# Patient Record
Sex: Female | Born: 1940 | Race: Black or African American | Hispanic: No | Smoking: Former smoker
Health system: Southern US, Community
[De-identification: ages and names within clinical notes are randomized; demographics above are authoritative.]

## PROBLEM LIST (undated history)

## (undated) DIAGNOSIS — I10 Essential (primary) hypertension: Secondary | ICD-10-CM

## (undated) DIAGNOSIS — D649 Anemia, unspecified: Secondary | ICD-10-CM

## (undated) DIAGNOSIS — J449 Chronic obstructive pulmonary disease, unspecified: Secondary | ICD-10-CM

## (undated) DIAGNOSIS — E785 Hyperlipidemia, unspecified: Secondary | ICD-10-CM

## (undated) DIAGNOSIS — H547 Unspecified visual loss: Secondary | ICD-10-CM

## (undated) DIAGNOSIS — R109 Unspecified abdominal pain: Secondary | ICD-10-CM

## (undated) DIAGNOSIS — N183 Chronic kidney disease, stage 3 unspecified: Secondary | ICD-10-CM

## (undated) DIAGNOSIS — M1990 Unspecified osteoarthritis, unspecified site: Secondary | ICD-10-CM

## (undated) DIAGNOSIS — Z720 Tobacco use: Secondary | ICD-10-CM

## (undated) DIAGNOSIS — C3490 Malignant neoplasm of unspecified part of unspecified bronchus or lung: Secondary | ICD-10-CM

## (undated) DIAGNOSIS — I739 Peripheral vascular disease, unspecified: Secondary | ICD-10-CM

## (undated) DIAGNOSIS — I82409 Acute embolism and thrombosis of unspecified deep veins of unspecified lower extremity: Secondary | ICD-10-CM

## (undated) DIAGNOSIS — I809 Phlebitis and thrombophlebitis of unspecified site: Secondary | ICD-10-CM

## (undated) DIAGNOSIS — C801 Malignant (primary) neoplasm, unspecified: Secondary | ICD-10-CM

## (undated) HISTORY — PX: WRIST FRACTURE SURGERY: SHX121

## (undated) HISTORY — PX: FRACTURE SURGERY: SHX138

## (undated) HISTORY — PX: EYE SURGERY: SHX253

## (undated) HISTORY — DX: Essential (primary) hypertension: I10

## (undated) HISTORY — DX: Unspecified abdominal pain: R10.9

## (undated) HISTORY — DX: Unspecified visual loss: H54.7

---

## 1971-05-13 HISTORY — PX: SHOULDER OPEN ROTATOR CUFF REPAIR: SHX2407

## 1981-05-12 HISTORY — PX: PATELLA FRACTURE SURGERY: SHX735

## 1997-09-16 ENCOUNTER — Emergency Department (HOSPITAL_COMMUNITY): Admission: EM | Admit: 1997-09-16 | Discharge: 1997-09-16 | Payer: Self-pay | Admitting: Emergency Medicine

## 1997-09-17 ENCOUNTER — Emergency Department (HOSPITAL_COMMUNITY): Admission: EM | Admit: 1997-09-17 | Discharge: 1997-09-17 | Payer: Self-pay | Admitting: Emergency Medicine

## 1997-09-19 ENCOUNTER — Emergency Department (HOSPITAL_COMMUNITY): Admission: EM | Admit: 1997-09-19 | Discharge: 1997-09-19 | Payer: Self-pay | Admitting: Emergency Medicine

## 1998-08-03 ENCOUNTER — Emergency Department (HOSPITAL_COMMUNITY): Admission: EM | Admit: 1998-08-03 | Discharge: 1998-08-03 | Payer: Self-pay | Admitting: Emergency Medicine

## 1998-10-14 ENCOUNTER — Encounter: Payer: Self-pay | Admitting: Emergency Medicine

## 1998-10-14 ENCOUNTER — Emergency Department (HOSPITAL_COMMUNITY): Admission: EM | Admit: 1998-10-14 | Discharge: 1998-10-14 | Payer: Self-pay | Admitting: Emergency Medicine

## 1998-10-18 ENCOUNTER — Encounter: Admission: RE | Admit: 1998-10-18 | Discharge: 1998-10-18 | Payer: Self-pay | Admitting: Internal Medicine

## 1998-11-15 ENCOUNTER — Encounter: Admission: RE | Admit: 1998-11-15 | Discharge: 1998-11-15 | Payer: Self-pay | Admitting: Hematology and Oncology

## 1998-11-29 ENCOUNTER — Encounter: Admission: RE | Admit: 1998-11-29 | Discharge: 1998-11-29 | Payer: Self-pay | Admitting: Hematology and Oncology

## 1999-09-17 ENCOUNTER — Emergency Department (HOSPITAL_COMMUNITY): Admission: EM | Admit: 1999-09-17 | Discharge: 1999-09-17 | Payer: Self-pay | Admitting: Emergency Medicine

## 1999-11-27 ENCOUNTER — Emergency Department (HOSPITAL_COMMUNITY): Admission: EM | Admit: 1999-11-27 | Discharge: 1999-11-27 | Payer: Self-pay | Admitting: Emergency Medicine

## 2000-04-07 ENCOUNTER — Emergency Department (HOSPITAL_COMMUNITY): Admission: EM | Admit: 2000-04-07 | Discharge: 2000-04-07 | Payer: Self-pay | Admitting: Emergency Medicine

## 2000-04-22 ENCOUNTER — Emergency Department (HOSPITAL_COMMUNITY): Admission: EM | Admit: 2000-04-22 | Discharge: 2000-04-22 | Payer: Self-pay | Admitting: Emergency Medicine

## 2000-04-27 ENCOUNTER — Encounter: Admission: RE | Admit: 2000-04-27 | Discharge: 2000-04-27 | Payer: Self-pay | Admitting: Internal Medicine

## 2000-08-17 ENCOUNTER — Encounter: Admission: RE | Admit: 2000-08-17 | Discharge: 2000-08-17 | Payer: Self-pay | Admitting: Internal Medicine

## 2001-01-03 ENCOUNTER — Emergency Department (HOSPITAL_COMMUNITY): Admission: EM | Admit: 2001-01-03 | Discharge: 2001-01-03 | Payer: Self-pay | Admitting: Emergency Medicine

## 2001-01-03 ENCOUNTER — Encounter: Payer: Self-pay | Admitting: Emergency Medicine

## 2001-01-19 ENCOUNTER — Encounter: Payer: Self-pay | Admitting: Emergency Medicine

## 2001-01-19 ENCOUNTER — Emergency Department (HOSPITAL_COMMUNITY): Admission: EM | Admit: 2001-01-19 | Discharge: 2001-01-19 | Payer: Self-pay | Admitting: Emergency Medicine

## 2001-01-22 ENCOUNTER — Encounter: Admission: RE | Admit: 2001-01-22 | Discharge: 2001-01-22 | Payer: Self-pay | Admitting: Internal Medicine

## 2001-02-16 ENCOUNTER — Encounter: Admission: RE | Admit: 2001-02-16 | Discharge: 2001-02-16 | Payer: Self-pay | Admitting: Internal Medicine

## 2001-02-22 ENCOUNTER — Encounter: Admission: RE | Admit: 2001-02-22 | Discharge: 2001-02-22 | Payer: Self-pay

## 2001-07-13 ENCOUNTER — Encounter: Admission: RE | Admit: 2001-07-13 | Discharge: 2001-07-13 | Payer: Self-pay | Admitting: Internal Medicine

## 2002-01-22 ENCOUNTER — Emergency Department (HOSPITAL_COMMUNITY): Admission: EM | Admit: 2002-01-22 | Discharge: 2002-01-22 | Payer: Self-pay | Admitting: Emergency Medicine

## 2002-02-16 ENCOUNTER — Emergency Department (HOSPITAL_COMMUNITY): Admission: EM | Admit: 2002-02-16 | Discharge: 2002-02-16 | Payer: Self-pay | Admitting: Emergency Medicine

## 2002-02-24 ENCOUNTER — Encounter: Admission: RE | Admit: 2002-02-24 | Discharge: 2002-02-24 | Payer: Self-pay | Admitting: Internal Medicine

## 2002-04-09 ENCOUNTER — Encounter: Payer: Self-pay | Admitting: Emergency Medicine

## 2002-04-09 ENCOUNTER — Emergency Department (HOSPITAL_COMMUNITY): Admission: EM | Admit: 2002-04-09 | Discharge: 2002-04-09 | Payer: Self-pay | Admitting: Emergency Medicine

## 2002-05-30 ENCOUNTER — Emergency Department (HOSPITAL_COMMUNITY): Admission: EM | Admit: 2002-05-30 | Discharge: 2002-05-30 | Payer: Self-pay | Admitting: Emergency Medicine

## 2002-09-10 HISTORY — PX: SHOULDER ARTHROSCOPY W/ ROTATOR CUFF REPAIR: SHX2400

## 2002-10-04 ENCOUNTER — Ambulatory Visit (HOSPITAL_BASED_OUTPATIENT_CLINIC_OR_DEPARTMENT_OTHER): Admission: RE | Admit: 2002-10-04 | Discharge: 2002-10-05 | Payer: Self-pay | Admitting: Orthopaedic Surgery

## 2002-10-04 ENCOUNTER — Encounter: Payer: Self-pay | Admitting: Orthopaedic Surgery

## 2002-10-05 ENCOUNTER — Encounter: Payer: Self-pay | Admitting: Orthopaedic Surgery

## 2002-10-20 ENCOUNTER — Emergency Department (HOSPITAL_COMMUNITY): Admission: EM | Admit: 2002-10-20 | Discharge: 2002-10-21 | Payer: Self-pay | Admitting: Emergency Medicine

## 2002-10-21 ENCOUNTER — Encounter: Payer: Self-pay | Admitting: Emergency Medicine

## 2002-12-08 ENCOUNTER — Emergency Department (HOSPITAL_COMMUNITY): Admission: EM | Admit: 2002-12-08 | Discharge: 2002-12-09 | Payer: Self-pay

## 2002-12-11 ENCOUNTER — Emergency Department (HOSPITAL_COMMUNITY): Admission: EM | Admit: 2002-12-11 | Discharge: 2002-12-11 | Payer: Self-pay | Admitting: Emergency Medicine

## 2002-12-13 ENCOUNTER — Encounter: Admission: RE | Admit: 2002-12-13 | Discharge: 2002-12-13 | Payer: Self-pay | Admitting: Internal Medicine

## 2002-12-18 ENCOUNTER — Emergency Department (HOSPITAL_COMMUNITY): Admission: EM | Admit: 2002-12-18 | Discharge: 2002-12-18 | Payer: Self-pay | Admitting: Emergency Medicine

## 2003-01-13 ENCOUNTER — Encounter: Admission: RE | Admit: 2003-01-13 | Discharge: 2003-01-13 | Payer: Self-pay | Admitting: Internal Medicine

## 2003-01-30 ENCOUNTER — Encounter: Admission: RE | Admit: 2003-01-30 | Discharge: 2003-01-30 | Payer: Self-pay | Admitting: Internal Medicine

## 2003-04-12 HISTORY — PX: CARPAL TUNNEL RELEASE: SHX101

## 2003-04-18 ENCOUNTER — Ambulatory Visit (HOSPITAL_BASED_OUTPATIENT_CLINIC_OR_DEPARTMENT_OTHER): Admission: RE | Admit: 2003-04-18 | Discharge: 2003-04-18 | Payer: Self-pay | Admitting: Orthopaedic Surgery

## 2003-04-18 ENCOUNTER — Ambulatory Visit (HOSPITAL_COMMUNITY): Admission: RE | Admit: 2003-04-18 | Discharge: 2003-04-18 | Payer: Self-pay | Admitting: Orthopaedic Surgery

## 2003-06-13 HISTORY — PX: SHOULDER OPEN ROTATOR CUFF REPAIR: SHX2407

## 2003-06-27 ENCOUNTER — Observation Stay (HOSPITAL_COMMUNITY): Admission: AD | Admit: 2003-06-27 | Discharge: 2003-06-28 | Payer: Self-pay | Admitting: Orthopaedic Surgery

## 2003-06-27 ENCOUNTER — Ambulatory Visit (HOSPITAL_BASED_OUTPATIENT_CLINIC_OR_DEPARTMENT_OTHER): Admission: RE | Admit: 2003-06-27 | Discharge: 2003-06-27 | Payer: Self-pay | Admitting: Orthopaedic Surgery

## 2003-06-27 ENCOUNTER — Ambulatory Visit (HOSPITAL_COMMUNITY): Admission: RE | Admit: 2003-06-27 | Discharge: 2003-06-27 | Payer: Self-pay | Admitting: Orthopaedic Surgery

## 2004-02-13 ENCOUNTER — Ambulatory Visit: Payer: Self-pay | Admitting: Internal Medicine

## 2004-03-05 ENCOUNTER — Ambulatory Visit: Payer: Self-pay | Admitting: Internal Medicine

## 2005-01-08 ENCOUNTER — Ambulatory Visit: Payer: Self-pay | Admitting: Internal Medicine

## 2005-01-21 ENCOUNTER — Ambulatory Visit (HOSPITAL_COMMUNITY): Admission: RE | Admit: 2005-01-21 | Discharge: 2005-01-21 | Payer: Self-pay | Admitting: Radiology

## 2005-01-21 ENCOUNTER — Encounter (INDEPENDENT_AMBULATORY_CARE_PROVIDER_SITE_OTHER): Payer: Self-pay | Admitting: Pulmonary Disease

## 2005-01-26 LAB — HM MAMMOGRAPHY: HM Mammogram: NORMAL

## 2005-02-24 ENCOUNTER — Ambulatory Visit (HOSPITAL_COMMUNITY): Admission: RE | Admit: 2005-02-24 | Discharge: 2005-02-24 | Payer: Self-pay | Admitting: Pulmonary Disease

## 2005-06-27 ENCOUNTER — Ambulatory Visit: Payer: Self-pay | Admitting: Internal Medicine

## 2005-07-23 ENCOUNTER — Ambulatory Visit: Payer: Self-pay | Admitting: Internal Medicine

## 2005-12-15 ENCOUNTER — Ambulatory Visit: Payer: Self-pay | Admitting: Internal Medicine

## 2006-02-23 ENCOUNTER — Emergency Department (HOSPITAL_COMMUNITY): Admission: EM | Admit: 2006-02-23 | Discharge: 2006-02-23 | Payer: Self-pay | Admitting: Emergency Medicine

## 2006-03-26 DIAGNOSIS — H548 Legal blindness, as defined in USA: Secondary | ICD-10-CM | POA: Insufficient documentation

## 2006-03-26 DIAGNOSIS — F17200 Nicotine dependence, unspecified, uncomplicated: Secondary | ICD-10-CM

## 2006-03-26 DIAGNOSIS — I10 Essential (primary) hypertension: Secondary | ICD-10-CM | POA: Insufficient documentation

## 2006-04-22 ENCOUNTER — Encounter: Admission: RE | Admit: 2006-04-22 | Discharge: 2006-07-21 | Payer: Self-pay | Admitting: Ophthalmology

## 2006-05-13 DIAGNOSIS — H9190 Unspecified hearing loss, unspecified ear: Secondary | ICD-10-CM

## 2006-06-25 ENCOUNTER — Encounter (INDEPENDENT_AMBULATORY_CARE_PROVIDER_SITE_OTHER): Payer: Self-pay | Admitting: Pulmonary Disease

## 2006-06-26 ENCOUNTER — Telehealth: Payer: Self-pay | Admitting: *Deleted

## 2006-07-02 ENCOUNTER — Ambulatory Visit: Payer: Self-pay | Admitting: Hospitalist

## 2006-07-02 ENCOUNTER — Encounter (INDEPENDENT_AMBULATORY_CARE_PROVIDER_SITE_OTHER): Payer: Self-pay | Admitting: Pulmonary Disease

## 2006-07-02 DIAGNOSIS — E785 Hyperlipidemia, unspecified: Secondary | ICD-10-CM | POA: Insufficient documentation

## 2006-07-02 DIAGNOSIS — J4489 Other specified chronic obstructive pulmonary disease: Secondary | ICD-10-CM | POA: Insufficient documentation

## 2006-07-02 DIAGNOSIS — J449 Chronic obstructive pulmonary disease, unspecified: Secondary | ICD-10-CM

## 2006-07-02 LAB — CONVERTED CEMR LAB
BUN: 9 mg/dL (ref 6–23)
CO2: 31 meq/L (ref 19–32)
Calcium: 9.3 mg/dL (ref 8.4–10.5)
Chloride: 102 meq/L (ref 96–112)
Creatinine, Ser: 0.96 mg/dL (ref 0.40–1.20)
Glucose, Bld: 87 mg/dL (ref 70–99)
Sodium: 136 meq/L (ref 135–145)

## 2006-07-07 ENCOUNTER — Ambulatory Visit: Payer: Self-pay | Admitting: Hospitalist

## 2006-07-10 ENCOUNTER — Telehealth (INDEPENDENT_AMBULATORY_CARE_PROVIDER_SITE_OTHER): Payer: Self-pay | Admitting: Pharmacy Technician

## 2006-07-16 ENCOUNTER — Ambulatory Visit: Payer: Self-pay | Admitting: Internal Medicine

## 2006-12-16 ENCOUNTER — Ambulatory Visit: Payer: Self-pay | Admitting: Internal Medicine

## 2006-12-16 ENCOUNTER — Ambulatory Visit (HOSPITAL_COMMUNITY): Admission: RE | Admit: 2006-12-16 | Discharge: 2006-12-16 | Payer: Self-pay | Admitting: *Deleted

## 2006-12-17 ENCOUNTER — Ambulatory Visit (HOSPITAL_COMMUNITY): Admission: RE | Admit: 2006-12-17 | Discharge: 2006-12-17 | Payer: Self-pay | Admitting: *Deleted

## 2006-12-17 ENCOUNTER — Ambulatory Visit: Payer: Self-pay | Admitting: Vascular Surgery

## 2006-12-17 ENCOUNTER — Encounter (INDEPENDENT_AMBULATORY_CARE_PROVIDER_SITE_OTHER): Payer: Self-pay | Admitting: *Deleted

## 2006-12-23 ENCOUNTER — Encounter: Payer: Self-pay | Admitting: Podiatry

## 2006-12-23 ENCOUNTER — Ambulatory Visit (HOSPITAL_COMMUNITY): Admission: RE | Admit: 2006-12-23 | Discharge: 2006-12-23 | Payer: Self-pay | Admitting: Podiatry

## 2007-03-19 ENCOUNTER — Encounter (INDEPENDENT_AMBULATORY_CARE_PROVIDER_SITE_OTHER): Payer: Self-pay | Admitting: Internal Medicine

## 2007-03-19 ENCOUNTER — Ambulatory Visit: Payer: Self-pay | Admitting: *Deleted

## 2007-03-22 LAB — CONVERTED CEMR LAB
ALT: 10 units/L (ref 0–35)
Alkaline Phosphatase: 81 units/L (ref 39–117)
BUN: 16 mg/dL (ref 6–23)
Chloride: 104 meq/L (ref 96–112)
Glucose, Bld: 105 mg/dL — ABNORMAL HIGH (ref 70–99)
HDL: 54 mg/dL (ref >39)
LDL Cholesterol: 125 mg/dL — ABNORMAL HIGH (ref 0–99)
Sodium: 140 meq/L (ref 135–145)
Total Bilirubin: 0.5 mg/dL (ref 0.3–1.2)
Total CHOL/HDL Ratio: 3.7
VLDL: 21 mg/dL (ref 0–40)

## 2007-04-19 ENCOUNTER — Emergency Department (HOSPITAL_COMMUNITY): Admission: EM | Admit: 2007-04-19 | Discharge: 2007-04-19 | Payer: Self-pay | Admitting: Emergency Medicine

## 2007-07-16 ENCOUNTER — Encounter (INDEPENDENT_AMBULATORY_CARE_PROVIDER_SITE_OTHER): Payer: Self-pay | Admitting: Internal Medicine

## 2007-07-16 ENCOUNTER — Ambulatory Visit: Payer: Self-pay | Admitting: Hospitalist

## 2007-07-16 DIAGNOSIS — H6120 Impacted cerumen, unspecified ear: Secondary | ICD-10-CM

## 2007-07-16 LAB — CONVERTED CEMR LAB
ALT: <8 units/L (ref 0–35)
Albumin: 4 g/dL (ref 3.5–5.2)
CO2: 24 meq/L (ref 19–32)
Calcium: 9 mg/dL (ref 8.4–10.5)
Chloride: 104 meq/L (ref 96–112)
Glucose, Bld: 97 mg/dL (ref 70–99)
Potassium: 4.5 meq/L (ref 3.5–5.3)

## 2007-07-30 ENCOUNTER — Ambulatory Visit: Payer: Self-pay | Admitting: Hospitalist

## 2007-07-30 ENCOUNTER — Encounter (INDEPENDENT_AMBULATORY_CARE_PROVIDER_SITE_OTHER): Payer: Self-pay | Admitting: Internal Medicine

## 2007-07-30 LAB — CONVERTED CEMR LAB
CO2: 26 meq/L (ref 19–32)
Potassium: 3.8 meq/L (ref 3.5–5.3)

## 2007-11-04 ENCOUNTER — Encounter (INDEPENDENT_AMBULATORY_CARE_PROVIDER_SITE_OTHER): Payer: Self-pay | Admitting: *Deleted

## 2007-12-02 ENCOUNTER — Encounter (INDEPENDENT_AMBULATORY_CARE_PROVIDER_SITE_OTHER): Payer: Self-pay | Admitting: *Deleted

## 2008-03-06 ENCOUNTER — Telehealth (INDEPENDENT_AMBULATORY_CARE_PROVIDER_SITE_OTHER): Payer: Self-pay | Admitting: *Deleted

## 2008-03-14 ENCOUNTER — Encounter (INDEPENDENT_AMBULATORY_CARE_PROVIDER_SITE_OTHER): Payer: Self-pay | Admitting: *Deleted

## 2008-03-21 ENCOUNTER — Encounter (INDEPENDENT_AMBULATORY_CARE_PROVIDER_SITE_OTHER): Payer: Self-pay | Admitting: *Deleted

## 2008-03-23 ENCOUNTER — Encounter (INDEPENDENT_AMBULATORY_CARE_PROVIDER_SITE_OTHER): Payer: Self-pay | Admitting: *Deleted

## 2008-04-04 ENCOUNTER — Telehealth (INDEPENDENT_AMBULATORY_CARE_PROVIDER_SITE_OTHER): Payer: Self-pay | Admitting: *Deleted

## 2008-04-05 ENCOUNTER — Encounter (INDEPENDENT_AMBULATORY_CARE_PROVIDER_SITE_OTHER): Payer: Self-pay | Admitting: *Deleted

## 2008-04-17 ENCOUNTER — Encounter (INDEPENDENT_AMBULATORY_CARE_PROVIDER_SITE_OTHER): Payer: Self-pay | Admitting: *Deleted

## 2008-04-17 ENCOUNTER — Ambulatory Visit: Payer: Self-pay | Admitting: Internal Medicine

## 2008-04-27 LAB — CONVERTED CEMR LAB
AST: 12 units/L (ref 0–37)
Cholesterol: 205 mg/dL — ABNORMAL HIGH (ref 0–200)
HDL: 55 mg/dL (ref >39)
Potassium: 4.8 meq/L (ref 3.5–5.3)
Sodium: 140 meq/L (ref 135–145)
Total Bilirubin: 0.5 mg/dL (ref 0.3–1.2)
Total CHOL/HDL Ratio: 3.7
Total Protein: 7.2 g/dL (ref 6.0–8.3)
Triglycerides: 110 mg/dL (ref <150)
VLDL: 22 mg/dL (ref 0–40)

## 2008-06-19 ENCOUNTER — Encounter (INDEPENDENT_AMBULATORY_CARE_PROVIDER_SITE_OTHER): Payer: Self-pay | Admitting: *Deleted

## 2008-08-04 ENCOUNTER — Encounter (INDEPENDENT_AMBULATORY_CARE_PROVIDER_SITE_OTHER): Payer: Self-pay | Admitting: *Deleted

## 2008-08-11 ENCOUNTER — Encounter (INDEPENDENT_AMBULATORY_CARE_PROVIDER_SITE_OTHER): Payer: Self-pay | Admitting: *Deleted

## 2008-08-17 ENCOUNTER — Encounter (INDEPENDENT_AMBULATORY_CARE_PROVIDER_SITE_OTHER): Payer: Self-pay | Admitting: *Deleted

## 2008-08-31 ENCOUNTER — Encounter (INDEPENDENT_AMBULATORY_CARE_PROVIDER_SITE_OTHER): Payer: Self-pay | Admitting: *Deleted

## 2008-12-29 ENCOUNTER — Emergency Department (HOSPITAL_COMMUNITY): Admission: EM | Admit: 2008-12-29 | Discharge: 2008-12-30 | Payer: Self-pay | Admitting: Emergency Medicine

## 2009-02-02 ENCOUNTER — Encounter: Payer: Self-pay | Admitting: Internal Medicine

## 2009-02-02 ENCOUNTER — Ambulatory Visit: Payer: Self-pay | Admitting: Infectious Diseases

## 2009-02-02 DIAGNOSIS — L299 Pruritus, unspecified: Secondary | ICD-10-CM | POA: Insufficient documentation

## 2009-02-02 LAB — CONVERTED CEMR LAB
ALT: <8 units/L (ref 0–35)
BUN: 15 mg/dL (ref 6–23)
CO2: 26 meq/L (ref 19–32)
Cholesterol: 182 mg/dL (ref 0–200)
Glucose, Bld: 98 mg/dL (ref 70–99)
LDL Cholesterol: 101 mg/dL — ABNORMAL HIGH (ref 0–99)
Potassium: 4.2 meq/L (ref 3.5–5.3)
Total Bilirubin: 0.5 mg/dL (ref 0.3–1.2)
Triglycerides: 105 mg/dL (ref <150)

## 2009-08-05 ENCOUNTER — Encounter: Payer: Self-pay | Admitting: Internal Medicine

## 2009-08-05 ENCOUNTER — Observation Stay (HOSPITAL_COMMUNITY): Admission: EM | Admit: 2009-08-05 | Discharge: 2009-08-06 | Payer: Self-pay | Admitting: Emergency Medicine

## 2009-08-05 ENCOUNTER — Ambulatory Visit: Payer: Self-pay | Admitting: Internal Medicine

## 2009-08-06 ENCOUNTER — Encounter: Payer: Self-pay | Admitting: Internal Medicine

## 2009-08-08 ENCOUNTER — Encounter: Payer: Self-pay | Admitting: Internal Medicine

## 2009-08-08 ENCOUNTER — Inpatient Hospital Stay (HOSPITAL_COMMUNITY): Admission: EM | Admit: 2009-08-08 | Discharge: 2009-08-12 | Payer: Self-pay | Admitting: Internal Medicine

## 2009-08-08 ENCOUNTER — Encounter: Payer: Self-pay | Admitting: Emergency Medicine

## 2009-08-11 ENCOUNTER — Encounter: Payer: Self-pay | Admitting: Internal Medicine

## 2009-08-16 ENCOUNTER — Ambulatory Visit: Payer: Self-pay | Admitting: Internal Medicine

## 2009-08-16 LAB — CONVERTED CEMR LAB
ALT: 17 units/L (ref 0–35)
Albumin: 4.2 g/dL (ref 3.5–5.2)
Alkaline Phosphatase: 72 units/L (ref 39–117)
BUN: 15 mg/dL (ref 6–23)
Chloride: 99 meq/L (ref 96–112)
Glucose, Bld: 98 mg/dL (ref 70–99)
HDL: 47 mg/dL (ref >39)
LDL Cholesterol: 96 mg/dL (ref 0–99)
Potassium: 3.9 meq/L (ref 3.5–5.3)
Total Bilirubin: 0.6 mg/dL (ref 0.3–1.2)
Total CHOL/HDL Ratio: 3.5
VLDL: 22 mg/dL (ref 0–40)

## 2009-09-13 ENCOUNTER — Inpatient Hospital Stay (HOSPITAL_COMMUNITY): Admission: EM | Admit: 2009-09-13 | Discharge: 2009-09-15 | Payer: Self-pay | Admitting: Emergency Medicine

## 2009-09-13 ENCOUNTER — Ambulatory Visit: Payer: Self-pay | Admitting: Internal Medicine

## 2009-09-13 ENCOUNTER — Encounter (INDEPENDENT_AMBULATORY_CARE_PROVIDER_SITE_OTHER): Payer: Self-pay | Admitting: Internal Medicine

## 2009-11-28 ENCOUNTER — Ambulatory Visit: Payer: Self-pay | Admitting: Vascular Surgery

## 2009-11-28 ENCOUNTER — Ambulatory Visit: Admission: RE | Admit: 2009-11-28 | Discharge: 2009-11-28 | Payer: Self-pay | Admitting: Internal Medicine

## 2009-11-28 ENCOUNTER — Ambulatory Visit: Payer: Self-pay | Admitting: Internal Medicine

## 2009-11-28 ENCOUNTER — Encounter: Payer: Self-pay | Admitting: Internal Medicine

## 2009-11-28 DIAGNOSIS — M79609 Pain in unspecified limb: Secondary | ICD-10-CM | POA: Insufficient documentation

## 2009-11-28 DIAGNOSIS — N183 Chronic kidney disease, stage 3 (moderate): Secondary | ICD-10-CM

## 2009-11-28 LAB — CONVERTED CEMR LAB
BUN: 10 mg/dL
CO2: 30 meq/L
Calcium: 9.2 mg/dL
Chloride: 103 meq/L
Creatinine, Ser: 0.95 mg/dL
Glucose, Bld: 97 mg/dL
Potassium: 4.3 meq/L
Sodium: 137 meq/L

## 2009-12-12 ENCOUNTER — Ambulatory Visit: Payer: Self-pay | Admitting: Internal Medicine

## 2010-01-10 HISTORY — PX: HAMMER TOE SURGERY: SHX385

## 2010-01-22 ENCOUNTER — Ambulatory Visit: Payer: Self-pay | Admitting: Internal Medicine

## 2010-01-22 LAB — CONVERTED CEMR LAB: Creatinine, Ser: 1.11 mg/dL (ref 0.40–1.20)

## 2010-01-24 ENCOUNTER — Ambulatory Visit (HOSPITAL_BASED_OUTPATIENT_CLINIC_OR_DEPARTMENT_OTHER): Admission: RE | Admit: 2010-01-24 | Discharge: 2010-01-24 | Payer: Self-pay | Admitting: Podiatry

## 2010-06-09 ENCOUNTER — Emergency Department (HOSPITAL_COMMUNITY)
Admission: EM | Admit: 2010-06-09 | Discharge: 2010-06-09 | Payer: Self-pay | Source: Home / Self Care | Admitting: Emergency Medicine

## 2010-06-11 NOTE — Assessment & Plan Note (Signed)
Summary: ACUTE-SCH SURGERY ON 12/04/2009 KNOT ON BACK OF LEG.(BOWERS)/CFB   Vital Signs:  Patient profile:   70 year old female Height:      61.1 inches (155.19 cm) Weight:      162.1 pounds (73.68 kg) BMI:     30.64 Temp:     97.6 degrees F (36.44 degrees C) oral Pulse rate:   74 / minute BP sitting:   185 / 91  (right arm)  Vitals Entered By: [name] Ditzler RN (November 28, 2009 9:47 AM) Is Patient Diabetic? No Pain Assessment Patient in pain? yes     Location: calf left leg Intensity: 8 Type: hurts Onset of pain  past 3 days Nutritional Status BMI of > 30 = obese Nutritional Status Detail appetite good  Have you ever been in a relationship where you felt threatened, hurt or afraid?denies   Does patient need assistance? Functional Status Self care Ambulation Normal Comments Ck calf left leg - hurts with walking past 3 days. Sch for left foot surgery 12/04/09.   Primary Care Provider:  Joaquin Courts  MD   History of Present Illness: Patient is a 70 year old female who presents today with concerns about left calf pain.  She noticed the pain a few weeks back and also the presence of "a kernel behind my leg."  She states that the pain hurts when she is walking for a while and gets better with rest and that her leg had also swollen up at that time.  She denies fever, chills, redness in the area, or warmth over the mass.  She tried ibuprofen and Vicodin which did not help the pain. She states that she has a history of cellulitis and clots in both legs but is unable to provide any further details.  She is concerned because she is scheduled to have surgery to fix hammer toes on her left foot on 12/04/09.    Of note her blood pressure is elevated today.  She reports that she has been taking her medicines for the last month.  Denies headaches, nausea, vomiting, spots in her vision, chest pain, or shortness of breath.   Depression History:      The patient denies a depressed mood most of  the day and a diminished interest in her usual daily activities.        The patient denies that she feels like life is not worth living, denies that she wishes that she were dead, and denies that she has thought about ending her life.         Preventive Screening-Counseling & Management  Alcohol-Tobacco     Smoking Status: current     Smoking Cessation Counseling: yes     Packs/Day: 1/2 pp week     Year Started: over 20 years ago  Caffeine-Diet-Exercise     Does Patient Exercise: yes     Type of exercise: walking/exercise room     Times/week: 2  Current Medications (verified): 1)  Prinivil 10 Mg  Tabs (Lisinopril) .... Take 1 Pill By Mouth Daily. 2)  Simvastatin 40 Mg  Tabs (Simvastatin) .... Take 1 Pill By Mouth Daily. 3)  Advair Diskus 100-50 Mcg/dose Misc (Fluticasone-Salmeterol) .... Inhale Twice A Day. 4)  Spiriva Handihaler 18 Mcg  Caps (Tiotropium Bromide Monohydrate) .... Take 1 Pill Inhaler Every Day. 5)  Vicodin 5-500 Mg Tabs (Hydrocodone-Acetaminophen) .... Take 1 Tablet 2 Times Daily As Needed For Pain 6)  Metoprolol Tartrate 50 Mg Tabs (Metoprolol Tartrate) .... Take 1  Tablet By Mouth Two Times A Day 7)  Aspir-Low 81 Mg Tbec (Aspirin) .... Take 1 Tablet By Mouth Once A Day  Allergies: 1)  Codeine 2)  Morphine  Past History:  Past medical, surgical, family and social histories (including risk factors) reviewed for relevance to current acute and chronic problems.  Past Medical History: Reviewed history from 04/17/2008 and no changes required. HYPERTENSION TOBACCO ABUSE BLINDNESS    - s/p bilateral corneal transplant - she can now see well HEARING LOSS  Past Surgical History: Reviewed history from 12/16/2006 and no changes required. Right shoulder surgery (rotator cuff) in 1973 - secondary numbness in R hand  Family History: Reviewed history from 12/16/2006 and no changes required. Mother and MGM died of liver CA Maternal uncle had colon CA   Social  History: Reviewed history and no changes required.  Review of Systems      See HPI  Physical Exam  Additional Exam:  General: Well developed, female in no acute distress  Head: Atraumatic, normocephalic with no signs of trauma  Eyes: PERRLA, EOM intact  Ears: TM intact  Nose: Nares patent, mucosa is pink and moist, no polyps noted  Mouth: Mucosa is pink and moist, dention,   Neck: Supple, full ROM, no thyromegaly or masses noted  Resp: Scattered expiratory wheezes bilaterally with mild rhonchi. CV: Regular rate and rhythm with no murmurs, rubs, or gallops noted  Abdomen: Soft, non-tender, non-distended with normal bowel sounds  Calf:  Bilateral +1 edema, there is a 1.5 cm diameter mass at the midpoint of the posterior portion of the left calf that is tender to palpation without erythema or warmth. It is fixed. Neurologic: Alert and oriented x3, Cranial nerves II-XII grossly intact, DTR normal and symmetric  Pulses: Radial, brachial, carotid, femoral, dorsal pedis, and posterior tibial pulses equal and symmetric     Impression & Recommendations:  Problem # 1:  CALF PAIN, LEFT (ICD-729.5) With her history of clots and the pain with walking and unilateral swelling we will do a venous doppler of the mass and left calf to check for DVT.   Results of the Venous duplex were telephoned to myself by [place] in the [facility].  She states that there is no thrombus noted and that the mass in the left calf is not vascular.  I think the next step is an MRI to better characterize the mass.  We will try to get that scheduled for today if possible because she has a hard time getting transportation here.  I will call the foot doctor Dr. Elvin So and coordinate with him to see if he is comfortable doing the surgery before we know exactly what this mass.  If a biopsy is needed we could conceivably get a biopsy done at the same time.  Orders: LE Venous Duplex (DVT) (DVT) MRI with Contrast (MRI  w/Contrast)  Problem # 2:  HYPERTENSION (ICD-401.9) Her blood pressure is elevated today. She reports that she is taking her medicines as prescribed.  We will increase her lisinopril to 20 mg daily and see her back in 1 month to reassess her blood pressure.  Her updated medication list for this problem includes:    Lisinopril 20 Mg Tabs (Lisinopril) .Marland Kitchen... Take 1 tablet by mouth once a day for blood pressure    Metoprolol Tartrate 50 Mg Tabs (Metoprolol tartrate) .Marland Kitchen... Take 1 tablet by mouth two times a day  BP today: 185/91 Prior BP: 145/86 (08/16/2009)  Labs Reviewed: K+: 3.9 (08/16/2009) Creat: :  1.08 (08/16/2009)   Chol: 165 (08/16/2009)   HDL: 47 (08/16/2009)   LDL: 96 (08/16/2009)   TG: 108 (08/16/2009)  Problem # 3:  HYPERLIPIDEMIA NEC/NOS (ICD-272.4) She is not diabetic so her goal is for LDL is 100.  She is at goal right now and taking her medications as prescribed.  She will need a recheck in October of her FLP and liver enzymes.  Her updated medication list for this problem includes:    Simvastatin 40 Mg Tabs (Simvastatin) .Marland Kitchen... Take 1 pill by mouth daily.  Labs Reviewed: SGOT: 15 (08/16/2009)   SGPT: 17 (08/16/2009)   HDL:47 (08/16/2009), 60 (02/02/2009)  LDL:96 (08/16/2009), 101 (04/54/0981)  Chol:165 (08/16/2009), 182 (02/02/2009)  Trig:108 (08/16/2009), 105 (02/02/2009)  Complete Medication List: 1)  Lisinopril 20 Mg Tabs (Lisinopril) .... Take 1 tablet by mouth once a day for blood pressure 2)  Simvastatin 40 Mg Tabs (Simvastatin) .... Take 1 pill by mouth daily. 3)  Advair Diskus 100-50 Mcg/dose Misc (Fluticasone-salmeterol) .... Inhale twice a day. 4)  Spiriva Handihaler 18 Mcg Caps (Tiotropium bromide monohydrate) .... Take 1 pill inhaler every day. 5)  Vicodin 5-500 Mg Tabs (Hydrocodone-acetaminophen) .... Take 1 tablet 2 times daily as needed for pain 6)  Metoprolol Tartrate 50 Mg Tabs (Metoprolol tartrate) .... Take 1 tablet by mouth two times a day 7)  Aspir-low  81 Mg Tbec (Aspirin) .... Take 1 tablet by mouth once a day  Other Orders: T-Basic Metabolic Panel [phone])  Patient Instructions: 1)  Start Lisinopril 20 mg Daily for blood pressure 2)  Do your Ultrasound today and then come back down to clinic to wait for results.   3)  Please schedule a follow-up appointment in 1 month. Prescriptions: METOPROLOL TARTRATE 50 MG TABS (METOPROLOL TARTRATE) Take 1 tablet by mouth two times a day  #62 x 3   Entered and Authorized by:   Leodis Sias MD   Signed by:   Leodis Sias MD on 11/28/2009   Method used:   Electronically to        [facility] [address] (retail)       [address]       [place], [place]  [id number]       Ph: [phone]       Fax: [phone]   RxID:   [id number] LISINOPRIL 20 MG TABS (LISINOPRIL) Take 1 tablet by mouth once a day for blood pressure  #30 x 11   Entered and Authorized by:   Leodis Sias MD   Signed by:   Leodis Sias MD on 11/28/2009   Method used:   Electronically to        [facility] (retail)       [address]       [place], [place]  [id number]       Ph: [phone]       Fax: [phone]   RxID:   [id number]  Process Orders Check Orders Results:     [facility]: Check successful Tests Sent for requisitioning (December 07, 2009 12:35 AM):     11/28/2009: [facility] -- T-Basic Metabolic Panel [phone] (signed)    Prevention & Chronic Care Immunizations   Influenza vaccine: Fluvax 3+  (02/02/2009)   Influenza vaccine deferral: Not available  (11/28/2009)    Tetanus booster: Not documented   Td booster deferral: Deferred  (11/28/2009)    Pneumococcal vaccine: Not documented   Pneumococcal vaccine deferral: Deferred  (11/28/2009)  H. zoster vaccine: Not documented   H. zoster vaccine deferral: Not indicated  (08/16/2009)  Colorectal Screening   Hemoccult: Not documented    Hemoccult action/deferral: Not indicated  (08/16/2009)    Colonoscopy: Not documented   Colonoscopy action/deferral: Deferred  (11/28/2009)  Other Screening   Pap smear: Not documented   Pap smear action/deferral: Deferred-3 yr interval  (08/16/2009)    Mammogram: Normal  (Jun 24, 202006)   Mammogram action/deferral: Deferred-2 yr interval  (08/16/2009)    DXA bone density scan: Not documented   DXA bone density action/deferral: Refused  (08/16/2009)   Smoking status: current  (11/28/2009)   Smoking cessation counseling: yes  (11/28/2009)  Lipids   Total Cholesterol: 165  (08/16/2009)   Lipid panel action/deferral: Lipid Panel ordered   LDL: 96  (08/16/2009)   LDL Direct: Not documented   HDL: 47  (08/16/2009)   Triglycerides: 108  (08/16/2009)    SGOT (AST): 15  (08/16/2009)   SGPT (ALT): 17  (08/16/2009)   Alkaline phosphatase: 72  (08/16/2009)   Total bilirubin: 0.6  (08/16/2009)    Lipid flowsheet reviewed?: Yes   Progress toward LDL goal: At goal  Hypertension   Last Blood Pressure: 185 / 91  (11/28/2009)   Serum creatinine: 1.08  (08/16/2009)   Serum potassium 3.9  (08/16/2009)    Hypertension flowsheet reviewed?: Yes   Progress toward BP goal: Deteriorated  Self-Management Support :   Personal Goals (by the next clinic visit) :      Personal blood pressure goal: 140/90  (02/02/2009)     Personal LDL goal: 130  (02/02/2009)    Patient will work on the following items until the next clinic visit to reach self-care goals:     Medications and monitoring: take my medicines every day  (11/28/2009)     Eating: eat more vegetables, use fresh or frozen vegetables, eat foods that are low in salt, eat fruit for snacks and desserts  (11/28/2009)     Activity: take a 30 minute walk every day  (11/28/2009)     Other: EXERCISE  BIKE  (02/02/2009)    Hypertension self-management support: Written self-care plan, Education handout, Resources for patients handout   (11/28/2009)   Hypertension self-care plan printed.   Hypertension education handout printed    Lipid self-management support: Written self-care plan, Education handout, Resources for patients handout  (11/28/2009)   Lipid self-care plan printed.   Lipid education handout printed      Resource handout printed.  Appended Document: ACUTE-SCH SURGERY ON 12/04/2009 KNOT ON BACK OF LEG.(BOWERS)/CFB I have discussed the care of this patient in detail with the resident and agree fully with the documentation completed.

## 2010-06-11 NOTE — Miscellaneous (Signed)
Summary: Hospital Discharge Update  Hospital Discharge  Date of admission:08/05/2009  Date of discharge:08/06/2009  Brief reason for admission/active problems: 1) Epigastric pain likely secondary to ileus vs small bowel obstruction   Followup needed: Pt will be called by our clinic rep for the exact appointment date and time. Please follow up on her abdominal pain. No need to check bmet or cbc.  The medication and problem lists have been updated.  Please see the dictated discharge summary for details.     Prescriptions: VICODIN 5-500 MG TABS (HYDROCODONE-ACETAMINOPHEN) take 1 tablet 2 times daily as needed for pain  #30 x 0   Entered by:   Mliss Sax MD   Authorized by:   Melida Quitter MD   Signed by:   Mliss Sax MD on 08/06/2009   Method used:   Print then Give to Patient   RxID:   [id number]    Patient Instructions: 1)  You will be called from our clinic for the exact appointment time and date. 2)  Please check your blood pressure regularly, if it is >170 please call clinic at [phone]   Appended Document: Hospital Discharge Update Pt will be called by our clinic rep for the exact appointment date and time. Please follow up on her abdominal pain. Please stop taking HCTZ since you have low potassium. On follow up appointment check bmet to ensure that all labs are WNL.  Patient Instructions: 1)  Do not take HCTZ until you come to our clinic. 2)  You will be called with the exact appointment date and time.  3)  Check your Blood Pressure regularly. If it is above: you should make an appointment.

## 2010-06-11 NOTE — Initial Assessments (Signed)
INTERNAL MEDICINE ADMISSION HISTORY AND PHYSICAL  PCP: Dr. Joaquin Courts  Attending: Dr. Phillips Odor.  First Contact: Dr. Baltazar Apo [phone]) Second Contact: Dr. Aldine Contes [phone])  Weekdays, Holidays, or after 5pm weekdays:  First Contact: [phone] Second Contact:[phone]  CC: Abdominal Pain  HPI:  [name] is a 70 year old woman with PMH significant for HTN, HLD and tobacco abuse who was recently discharged from hospital on 3/28 for abdominal pain, Presents today to [facility] ED with Abdominal pain. Per last admission and discharge 3 days ago patient had clinical signs and imaging (ct and cxr) which suggested that the patient had SBO/Ileus ( CT abdomen is more consistent with ileus showing mild small bowel dilatation) however patient tolerated by mouth intake well and was in no severe pain, therefore she was d/c home with f/u and vicodin. However patients abdominal pain worsened and she came again to [facility] ED, per imaging today patient remains to have patterns of SBO. Last BM: Saturday 3/26 and said has not been able to pass gas since last saturday. Patient has Nausea and Vomiting several times a day of moderate billious vomitus without blood.Patient had Colonoscopy: 1 year ago and was WNL.  At time of our examination patient had already an NG tube in place and stated that this has significantly relieved her symptoms over the past few hours. Patient described the abdominal pain as crampy and sharp diffusely, and is mostly in upper epigastrium and also lower mid abdomen Pain is 10/10. The pain was worse with movement and is slightly relieved with deep inspiration. Appetite has return afterr NGT placement and suction, however since last saturday the patient was unable to tolerate anything by mouth, including meds.  No CP, SOB, fever, chills or sweats. No back pain.  No h/o prior abd surgeries. She also denies any urinary symptoms such as dysuria, frequency, or hesitancy.   No hx of any prior episodes  before all this started a few days ago.    ALLERGIES: ! CODEINE   PAST MEDICAL HISTORY  HYPERTENSION TOBACCO ABUSE h/o BLINDNESS    - s/p bilateral corneal transplant - she can now see well HEARING LOSS History of  left shoulder open rotator cuff repair History of right carpal tunnel release. History of  Right shoulder arthroscopic rotator cuff debridement.                 Right shoulder arthroscopic acromioplasty.                 Right shoulder arthroscopic acromioclavicular resection.  MEDICATIONS  HYDROCHLOROTHIAZIDE 25 MG TABS (HYDROCHLOROTHIAZIDE) Take 1 tablet by mouth once a day (Held per last admission) PRINIVIL 10 MG  TABS (LISINOPRIL) take 1 pill by mouth daily. SIMVASTATIN 40 MG  TABS (SIMVASTATIN) take 1 pill by mouth daily. ADVAIR DISKUS 100-50 MCG/DOSE MISC (FLUTICASONE-SALMETEROL) Inhale twice a day. SPIRIVA HANDIHALER 18 MCG  CAPS (TIOTROPIUM BROMIDE MONOHYDRATE) take 1 pill inhaler every day. BENADRYL MAXIMUM STRENGTH 2 % CREA (DIPHENHYDRAMINE HCL) Apply to the affected area 3-4 times as needed for itching Vicodin (given on last d/c as needed for abdominal pain)   SOCIAL HISTORY Patient lives by herself in an apartment Smokes 1/2 PPD No alcohol or illicit drug use    FAMILY HISTORY Mother and MGM died of liver CA Maternal uncle had colon CA   ROS All systems reviewed and negative except per HPI  VITALS  T:  98.7 P: 88 BP: 156/67 R: 16 O2SAT: 97 ON: RA  PHYSICAL EXAM  Gen: Patient is in NAD Eyes: PERRL, No signs of anemia or jaundince. ENT: dry mucous membranes Neck: Supple, No carotid Bruits, No JVD, No thyromegaly Resp: CTA- Bilaterally, No W/C/R. CVS: S1S2 RRR, No M/R/G GI: Difffusely tender to palpation, greater in epigastric region, firm, no rebound tenderness, hypoactive bowel sounds, no guarding (per ER report and last admission examination). Patient abdomen was soft, non tender, hypoactive BS on examination. Ext: Trace bilateral pitting  edema, no cyanosis GU: No CVA tenderness. Skin: No visible rashes, scars. MS: Moving all 4 extremities. Neuro: A&O X3, CN II - XII are grossly intact. Motor strength is 5/5 in the all 4 extremities Psych: Appropriate   LABS    XR Abdomen IMPRESSION:   No intraperitoneal free air.   Small bowel dilatation with air-fluid levels and associated gastric   distention.  Small bowel obstruction is suspected.   WBC                                      8.6               4.0-10.5         K/uL  RBC                                      4.82              3.87-5.11        MIL/uL  Hemoglobin (HGB)                         15.8       h      12.0-15.0        g/dL  Hematocrit (HCT)                         46.9       h      36.0-46.0        %  MCV                                      97.4              78.0-100.0       fL  MCHC                                     33.7              30.0-36.0        g/dL  RDW                                      14.2              11.5-15.5        %  Platelet Count (PLT)                     174               150-400  K/uL  Neutrophils, %                           67                43-77            %  Lymphocytes, %                           21                12-46            %  Monocytes, %                             11                3-12             %  Eosinophils, %                           0                 0-5              %  Basophils, %                             2          h      0-1              %  Neutrophils, Absolute                    5.8               1.7-7.7          K/uL  Lymphocytes, Absolute                    1.8               0.7-4.0          K/uL  Monocytes, Absolute                      0.9               0.1-1.0          K/uL  Eosinophils, Absolute                    0.0               0.0-0.7          K/uL  Basophils, Absolute                      0.2        h      0.0-0.1          K/uL   Sodium (NA)                              136                135-145  mEq/L  Potassium (K)                            3.5               3.5-5.1          mEq/L  Chloride                                 91         l      96-112           mEq/L  CO2                                      34         h      19-32            mEq/L  Glucose                                  104        h      70-99            mg/dL  BUN                                      19                6-23             mg/dL  Creatinine                               1.57       h      0.4-1.2          mg/dL  GFR, Est Non African American            32         l      >60              mL/min  GFR, Est African American                39         l      >60              mL/min    Oversized comment, see footnote  1  Bilirubin, Total                         1.2               0.3-1.2          mg/dL  Alkaline Phosphatase                     80                39-117           U/L  SGOT (AST)  31                0-37             U/L  SGPT (ALT)                               16                0-35             U/L  Total  Protein                           6.7               6.0-8.3          g/dL  Albumin-Blood                            3.7               3.5-5.2          g/dL  Calcium                                  9.3               8.4-10.5         mg/dL   Appearance                               CLOUDY     a      CLEAR  Specific Gravity                         1.024             1.005-1.030  pH                                       7.0               5.0-8.0  Urine Glucose                            NEGATIVE          NEG              mg/dL  Bilirubin                                SMALL      a      NEG  Ketones                                  TRACE      a      NEG              mg/dL  Blood  NEGATIVE          NEG  Protein                                  30         a      NEG              mg/dL  Urobilinogen                              0.2               0.0-1.0          mg/dL  Nitrite                                  NEGATIVE          NEG  Leukocytes                               MODERATE   a      NEG    Squamous Epithelial / LPF                FEW        a      RARE  WBC / HPF                                11-20             <3               WBC/hpf  Urine-Other                              SEE NOTE.    MUCOUS PRESENT   CT IMPRESSION (3/27):   Mild dilatation of small bowel containing air and fluid.  Pattern   favors ileus more than partial small bowel obstruction.  Single   calcified gallstone.   ASSESSMENT AND PLAN  1) Epigastric Pain: Per last admission 3 days ago patient had clinical signs and imaging (ct and cxr) which suggested that the patient had SBO/ileus ( CT abdomen is more consistent with ileus showing mild small bowel dilatation) however per d/c note patient tolerated by mouth intake well and was in no severe pain, therefore she was d/c home with f/u and vicodin. However patient stated that her pain was unchanged at d/c and was still unable to tolerate by mouth intake. The abdominal pain worsened over the last several days and she came again to [facility] ED for a reevalation.   Per imaging and exam today patient remains to have patterns of SBO and as the patient is not able to pass gas her ileus may have progressed to complete SBO.  Differential includes SBO vs narcotic bowel (2/2 to vicodin given on d/c) vs Gallstone ileus (as seen per last CT, however this presentation is atypical of that) vs electrolyte abnormalities.  Other causes for abdominal pain such as pancreatitis, hypothyroidism, malignancy and UTI are low on differential as lipase was wnl 2 days ago, and no clinical signs of urinary tract infection, other than asymptomatic pyuria  and no signs of severe infection as WBC count is normal and patient is aferbrile. Note that patient does not have history of abdominal or pelvic surgeries; which make  strictures/ adhesions unlikely..  To note: patient recieved in ED:  Dilaudid 1mg  x2, zofran x2, and 1L IVF.  Plan: -Admit to regular floor -TSH and FT4 -NPO  -IV Hydration as patient appears to be dry -NG tube with intermittent suction.  -As needed Reglan IV for N/V -Pain control with IV NSAIDS (if needed), will avoid all narcotics. -Protonix  -Abd Xr in AM -Am BMET and CBC. -Consider gastrofin contrasted CT abd to reevaluate for obstruction (and as this procedure is also theraputic) -Also consider surgical consult if symptoms do not improve.  2) HTN: BP elevated. Likely secondary to not taking meds. Will hold HCTZ and Lisinopril as she is unable to take by by mouth's and her renal function is a little worse than at baseline, will plan to restart as symptoms improve. will write for iv hydralazine as needed for SBP >180.   3) CKD stage III: Baseline Cr 1-1.3, Cr now 1.57, which is sligthly worse than her baseline. Will hydrate and repeat labs.    4) Pyuria: UA revealing moderate leukocyte esterase, WBC 11-20. Patient however is asymptomatic. On last d/c she also had asymptomatic pyruia with urine cultures still pending as of today. Patient was given 5 day script for cipro on 3/28 however patient did not take this as she was unable to tolerate by mouth meds, will give IV cipro pending cultures, and depending results will determine length of therapy.  5) HLD: (Last FLP 07/2009)  Total cholesterol 174, HLD 57, LDL 102, Will hold statins for now as she is NPO with plan to restart when advancing diet.   6) Tobacco Abuse: Patient does not have desire to quit. Will start Nicotine patch and will request social work smoking cessation counseling.  And will continue home inhailers as needed for SOB, Consider PFT as OP for exact diagnosis for inhailer needs as she is on them without a diagnosis.   7) VTE Prophylaxis: Lovenox      ATTENDING: I performed and/or observed a history and physical  examination of the patient.  I discussed the case with the residents as noted and reviewed the residents' notes.  I agree with the findings and plan--please refer to the attending physician note for more details.  Signature________________________________  Printed Name_____________________________

## 2010-06-11 NOTE — Miscellaneous (Signed)
Summary: Hospital Admission  INTERNAL MEDICINE ADMISSION HISTORY AND PHYSICAL  PCP: Dr. Joaquin Courts  Attending: Dr. Rogelia Boga  First Contact: Dr. Arvilla Market [phone]) Second Contact: Dr. Gwenlyn Perking [phone])  Weekdays, Holidays, or after 5pm weekdays: First Contact: [phone] Second [phone]  CC: Abdominal Pain/vomiting  HPI: Pt is a 70 yo female w/ past med hx of htn, COPD, and 2 prior hospitalizations in the last couple of months with partial SBO's who presented to the ER c/o difuse, poorly localized abdominal pain x 2-3 days prior to admission that was 10/10 in severity at its worse and described as crmapy in nature.  She has a few episodes of non bloody, nonbilious vomiting this am.  She had tried vicodin at home to help w/ the pain (had not taken it before she got the pain) and it did not help.  She also took milk of magnesia without relief.  She had has been moving her bowels without difficulty and is still passing gas.  She notes a 20 lb weight loss that appears to have occurred over the last 2 years.  She denies prior abdominal surgeries and notes a normal colonoscopy within the last year.  She also denies hematemesis, hematochezia, melena, fevers, chills, dysuria, increased frequency, chest pain, and SOB.   ALLERGIES: CODEINE-feels funny Morphine-feels funny  PAST MEDICAL HISTORY: HYPERTENSION TOBACCO ABUSE BLINDNESS    - s/p bilateral corneal transplant - she can now see well HEARING LOSS COPD  MEDICATIONS:  PRINIVIL 10 MG  TABS (LISINOPRIL) take 1 pill by mouth daily. SIMVASTATIN 40 MG  TABS (SIMVASTATIN) take 1 pill by mouth daily. ADVAIR DISKUS 100-50 MCG/DOSE MISC (FLUTICASONE-SALMETEROL) Inhale twice a day. SPIRIVA HANDIHALER 18 MCG  CAPS (TIOTROPIUM BROMIDE MONOHYDRATE) take 1 pill inhaler every day. BENADRYL MAXIMUM STRENGTH by mouth as needed  VICODIN 5-500 MG TABS (HYDROCODONE-ACETAMINOPHEN) take 1 tablet 2 times daily as needed for pain METOPROLOL TARTRATE  50 MG TABS (METOPROLOL TARTRATE) Take 1 tablet by mouth two times a day ASPIR-LOW 81 MG TBEC (ASPIRIN) Take 1 tablet by mouth once a day NICODERM CQ 14 MG/24HR PT24 (NICOTINE) Place one patch on skin daily REGLAN 5 MG TABS (METOCLOPRAMIDE HCL) Take 1 tablet by mouth two times a day 30 minutes  before meals  ER Meds: 1. Fentalnyl 50 micrograms 2. dilaudid .5 mg 3. albuterol/atrovent 4. zofran 5. rocephin 1 g   SOCIAL HISTORY:  She is divorced, lives at home alone, smokes 1/2 PPD x many years, denies alcohol.   FAMILY HISTORY Mother and MGM died of liver CA Maternal uncle had colon CA  Brothers w/ gastric ca  ROS:  as per HPI.  VITALS: T: 97.0 P:70  BP:183/82  R:19  O2SAT: 99% ON: RA  PHYSICAL EXAM: General:  alert, well-developed, and cooperative to examination.   Head:  normocephalic and atraumatic.   Eyes:  vision grossly intact, pupils equal, pupils round, pupils reactive to light, EOMI, lateral nystagmus noted.  Ears: Hearing impaired and hearing aids in place.   Mouth:  pharynx pink and moist, edentulous.   Neck:  supple, full ROM, no thyromegaly, no JVD, likely a small supraclavicular LN palpated on the left.   Lungs:  normal respiratory effort, no accessory muscle use, bilateral insp and exp wheezing heard best anteriorly. Heart:  normal rate, regular rhythm, no murmur, no gallop, and no rub.   Abdomen:  Hypoactive BS', abdomen somewhat firm, difusely TTP mildly globally without rebound or gaurding, mod distension noted.   Rectal: FOBT neg, external hemorrhoids  noted, no rectal impaction. Msk:  no joint swelling, no joint warmth, and no redness over joints.   Pulses:  2+ DP/PT pulses bilaterally Extremities:  No cyanosis, clubbing, edema, dystrophic toenails noted on L foot. Neurologic:  alert & oriented X3, cranial nerves II-XII intact, strength normal in all extremities, lateral nystagmus noted w/ lateral gase.  Skin:  turgor normal and no rashes.   Psych:  Oriented  X3, memory intact for recent and remote, normally interactive.  LABS:  WBC                                       6.3               4.0-10.5         K/uL    WHITE COUNT CONFIRMED ON SMEAR  RBC                                      4.42              3.87-5.11        MIL/uL  Hemoglobin (HGB)                         14.6              12.0-15.0        g/dL  Hematocrit (HCT)                         42.7              36.0-46.0        %  MCV                                      96.7              78.0-100.0       fL  MCHC                                     34.1              30.0-36.0        g/dL  RDW                                      14.3              11.5-15.5        %  Platelet Count (PLT)                     182               150-400          K/uL  Neutrophils, %                           69                43-77            %  Lymphocytes, %                           22                12-46            %  Monocytes, %                             7                 3-12             %  Eosinophils, %                           1                 0-5              %  Basophils, %                             1                 0-1              %  Neutrophils, Absolute                    4.3               1.7-7.7          K/uL  Lymphocytes, Absolute                    1.4               0.7-4.0          K/uL  Monocytes, Absolute                      0.5               0.1-1.0          K/uL  Eosinophils, Absolute                    0.1               0.0-0.7          K/uL  Basophils, Absolute                      0.0               0.0-0.1          K/uL Sodium (NA)                              133        l      135-145          mEq/L  Potassium (K)                            3.2        l      3.5-5.1          mEq/L  Chloride  100               96-112           mEq/L  CO2                                      28                19-32            mEq/L  Glucose                                   123        h      70-99            mg/dL  BUN                                      6                 6-23             mg/dL  Creatinine                               1.11              0.4-1.2          mg/dL  GFR, Est Non African American            48         l      >60              mL/min  GFR, Est African American                58         l      >60              mL/min    Oversized comment, see footnote  1  Bilirubin, Total                         0.7               0.3-1.2          mg/dL  Alkaline Phosphatase                     83                39-117           U/L  SGOT (AST)                               13                0-37             U/L  SGPT (ALT)                               <8  0-35             U/L  Total  Protein                           6.5               6.0-8.3          g/dL  Albumin-Blood                            3.5               3.5-5.2          g/dL  Calcium                                  8.4               8.4-10.5         mg/dL  Lipase                                   16                11-59            U/L  Alcohol                                  <5                0-10             mg/dL  Color, Urine                             YELLOW            YELLOW  Appearance                               CLOUDY     a      CLEAR  Specific Gravity                         1.022             1.005-1.030  pH                                       5.5               5.0-8.0  Urine Glucose                            NEGATIVE          NEG              mg/dL  Bilirubin                                NEGATIVE          NEG  Ketones                                  NEGATIVE          NEG              mg/dL  Blood                                    TRACE      a      NEG  Protein                                  30         a      NEG              mg/dL  Urobilinogen                             0.2               0.0-1.0          mg/dL  Nitrite                                   NEGATIVE          NEG  Leukocytes                               MODERATE   a      NEG Squamous Epithelial / LPF                FEW        a      RARE  WBC / HPF                                21-50             <3               WBC/hpf  RBC / HPF                                0-2               <3               RBC/hpf  Bacteria / HPF                           FEW        a      RARE  Diagnostic Imaging: Acute Abd'l Series:     Findings: Semi upright AP view the chest.  Stable elevation of the   diaphragm. Stable cardiomegaly and mediastinal contours.  Similar   patchy and indistinct bibasilar opacity.  No pneumothorax.    No pneumoperitoneum.  Gas filled small bowel loops throughout the   mid abdomen measure up to 38 mm in diameter (previously 36 mm).   The  no definite colonic gas. No acute osseous abnormality   identified.    IMPRESSION:   1.  Small bowel obstruction similar to but increased from   comparison.   2.  No free air.   3.  Ongoing pleural effusions and bibasilar probable atelectasis.   ASSESSMENT AND PLAN: Pt is a 70 yo female w/: 1. Abd'l pain: Likely related to recurrent partial SBO vs ileus vs gastroparesis.  Unclear etiology as she denies prior surgeries.  DDx at this point includes gallstone ileus (known gallstone) vs malignancy (given age and significant family hx of malignancy and ? of supracalvicular LAD) vs medication related vs electrolyte related vs hypothyroidism. I don't think it is necessarily related to her meds b/c she reported symptoms prior to using vicodin.  Benadryl can certainly cause probs w/ anticholinergic side effects but this is uncommon.  She also had the symptoms prior to using milk of magnesia.  No evidence for pancreatitis on labs. At this point, will hydrate, replete electrolytes, as needed pain/nausea meds (unfortunately will likely need narcotics).  She currently has an NGT and hopefully we can remove this soon. At this point, she may  need surgical consultation b/c of recurrence and unclear etiology but this is not urgent at this time.  2.  Hypokalemia: Large BM after milk of magnesia likely the culprit along w/ vomiting.  Will replete IV.  3.  Wheezing/COPD: This appears to have started after NGT placed.  She has no resp complaints at this time. S/p nebulizer tx.  At this time, will cont home meds and follow resp status closely.  4.  HTN:  BP up initially but improved to systolic 130's w/ pain meds.  Will hold on betablocker b/c of wheezing and give meds as needed initially since npo.  5. Tobacco dependence: Nicotine patch.  6. Asymptomatic pyuria: F/u cx, hold on abx for now.  7. CRI: Cr around baseline.  Follow closely, limit nephrotoxic agents.   8. VTE PROPH: lovenox    ATTENDING: I performed and/or observed a history and physical examination of the patient.  I discussed the case with the residents as noted and reviewed the residents' notes.  I agree with the findings and plan--please refer to the attending physician note for more details.  Signature________________________________  Printed Name_____________________________

## 2010-06-11 NOTE — Initial Assessments (Signed)
INTERNAL MEDICINE ADMISSION HISTORY AND PHYSICAL  PCP: Dr. Joaquin Courts  First Contact: Dr. Baltazar Apo [phone]) Second Contact: Dr. Aldine Contes [phone])  Weekdays, Holidays, or after 5pm weekdays:  First Contact: [phone] Second [phone]  CC: Abdominal Pain  HPI:  [name] is a 70 year old woman with PMH significant for HTN, HLD and tobacco abuse who presented to the ED for abdominal pain. Pt was feeling fine well except for some mild constipation for a few days, had a small hard BM about 8 PM followed by onset of abdominal crampy and sharp pains diffusely, but she indicates mostly in upper epigastrium and also lower mid abdomen . Pain is 10/10. It has not improved at all despite taking a left over half of a vicodin and so she called EMS.  She reports vomiting once while being in the ED, no blood in vomitus and reports to being nauseated earlier, but notes that has resolved. The pain is worse with movement and is slightly relieved with deep inspiration. Appetite has been good.  No CP, SOB, sweats. No back pain.  No h/o prior abd surgeries. She also denies any urinary symptoms such as dysuria, frequency, or hesitancy. No hx of any prior episodes. Reports she ate some fruit prior to onset of pain. Denies eating fatty or fried food.  ALLERGIES: ! 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 History of  Right shoulder arthroscopic rotator cuff debridement.                 Right shoulder arthroscopic acromioplasty.                 Right shoulder arthroscopic acromioclavicular resection.  MEDICATIONS  HYDROCHLOROTHIAZIDE 25 MG TABS (HYDROCHLOROTHIAZIDE) Take 1 tablet by mouth once a day PRINIVIL 10 MG  TABS (LISINOPRIL) take 1 pill by mouth daily. SIMVASTATIN 40 MG  TABS (SIMVASTATIN) take  1 pill by mouth daily. ADVAIR DISKUS 100-50 MCG/DOSE MISC (FLUTICASONE-SALMETEROL) Inhale twice a day. SPIRIVA HANDIHALER 18 MCG  CAPS (TIOTROPIUM BROMIDE MONOHYDRATE) take 1 pill inhaler every day. BENADRYL MAXIMUM STRENGTH 2 % CREA (DIPHENHYDRAMINE HCL) Apply to the affected area 3-4 times as needed for itching   SOCIAL HISTORY  Patient lives by herself in an apartment Smokes 1/2 PPD No alcohol or illicit drug use    FAMILY HISTORY  Mother and MGM died of liver CA Maternal uncle had colon CA   ROS All systems reviewed and negative except per HPI  VITALS  T:  99.1 P: 76 BP: 178/79 R: 18 O2SAT: 95 ON: RA  PHYSICAL EXAM  Gen: Patient is in NAD Eyes: PERRL, No signs of anemia or jaundince. ENT: dry mucous membranes Neck: Supple, No carotid Bruits, No JVD, No thyromegaly Resp: CTA- Bilaterally, No W/C/R. CVS: S1S2 RRR, No M/R/G GI: Difffusely tender to palpation, greater in epigastric region, firm, no rebound tenderness, hypoactive bowel sounds, no guarding Ext: Trace bilateral pitting edema  GU: No CVA tenderness. Skin: No visible rashes, scars. MS: Moving all 4 extremities. Neuro: A&O X3, CN II - XII are grossly intact. Motor strength is 5/5 in the all 4 extremities Psych: Appropriate   LABS  CT ABD/PELVIS W/ CM  Mild dilatation of small bowel containing air and fluid.  Pattern favors ileus more than partial small bowel obstruction.  Single calcified gallstone.  CBC   WBC                                      7.1               4.0-10.5         K/uL  RBC                                      4.45              3.87-5.11        MIL/uL  Hemoglobin (HGB)                         14.5              12.0-15.0        g/dL  Hematocrit (HCT)                         43.3              36.0-46.0        %  MCV                                      97.3              78.0-100.0       fL  MCHC                                     33.5              30.0-36.0        g/dL  RDW                                       13.6              11.5-15.5        %  Platelet Count (PLT)                     197               150-400          K/uL  Neutrophils, %                           79         h      43-77            %  Lymphocytes, %                           15                12-46            %  Monocytes, %  5                 3-12             %  Eosinophils, %                           0                 0-5              %  Basophils, %                             0                 0-1              %  Neutrophils, Absolute                    5.6               1.7-7.7          K/uL  Lymphocytes, Absolute                    1.1               0.7-4.0          K/uL  Monocytes, Absolute                      0.3               0.1-1.0          K/uL  Eosinophils, Absolute                    0.0               0.0-0.7          K/uL  Basophils, Absolute                      0.0               0.0-0.1          K/uL   CMP   Sodium (NA)                              134        l      135-145          mEq/L  Potassium (K)                            2.9        l      3.5-5.1          mEq/L  Chloride                                 99                96-112           mEq/L  CO2  28                19-32            mEq/L  Glucose                                  126        h      70-99            mg/dL  BUN                                      9                 6-23             mg/dL  Creatinine                               1.07              0.4-1.2          mg/dL  GFR, Est Non African American            50         l      >60              mL/min  GFR, Est African American                >60               >60              mL/min    Oversized comment, see footnote  1  Bilirubin, Total                         0.6               0.3-1.2          mg/dL  Alkaline Phosphatase                     95                39-117           U/L  SGOT (AST)                                17                0-37             U/L  SGPT (ALT)                               10                0-35             U/L  Total  Protein                           6.9  6.0-8.3          g/dL  Albumin-Blood                            3.8               3.5-5.2          g/dL  Calcium                                  9.3               8.4-10.5         mg/dL   Lipase                                   14                11-59            U/L   Occult Blood, Fecal                      NEGATIVE   ASSESSMENT AND PLAN  1) Epigastric Pain - Differential includes Ileus vs Gallstone ileus vs SBO. CT abdomen is more consistent with ileus showing mild small bowel dilatation. Patient also reports being constipation prior to onset of pain. Single gallstone is also evident. Other causes for abdominal pain such as pancreatitis and UTI are low on differential as lipase wnl, and no clinical signs of urinary tract infection, other than asymptomatic pyuria.  Plan: -Replete electrolytes (hypoK may be contributing to ileus) -Hydration -Colace and Miralax for constipation  -Will not place NG tube at this point as patient appears clinically stable  -Will start clear liquid diet on patient and advance as tolerated  -Pain control  -Repeat abdominal xray in AM to re-evaluate ileus  -Protonix   2) Hypokalemia - Secondary to gastric losses and HCTZ. Will replete accordingly and continue to monitor electrolytes. Will also check Mg, Phos and 12-Lead EKG.   3) HTN - BP consistently elevated. Likely secondary to not taking meds. Will hold HCTZ in setting of hypokalemia and resume Lisinopril.   4) Pyuria - UA revealing moderate leukocyte esterase and nitrites, WBC too numerous to count. Patient however is asymptomatic. Plan to recheck UA. Will not start abx as patient is asymptomatic.   5) HLD - Will start home dose Simvastatin. Recheck FLP.   6) Tobacco Abuse - Patient does not have  desire to quit. Will start Nicotine patch.   7) VTE Prophylaxis - Lovenox

## 2010-06-11 NOTE — Assessment & Plan Note (Signed)
Summary: ACUTE-TO GO OVER TEST RESULTS OF MRI/(BOWERS)   Vital Signs:  Patient profile:   70 year old female Height:      61.1 inches (155.19 cm) Weight:      161.2 pounds (73.27 kg) BMI:     30.47 Temp:     97.0 degrees F (36.11 degrees C) oral Pulse rate:   79 / minute BP sitting:   146 / 80  (right arm) Cuff size:   regular  Vitals Entered By: Theotis Barrio NT II (December 12, 2009 10:32 AM) CC: PATIET STATES SHE IS NOT HERE FOR RESULTS / SHE SAYS SHE IS HERE FOR X-RAY Is Patient Diabetic? Yes Pain Assessment Patient in pain? yes     Location: LEFT LEG Intensity:      8 Type: sharp Onset of pain  FOR ABOUT 2 WEEKS / PAIN WHEN WALKING Nutritional Status BMI of > 30 = obese  Have you ever been in a relationship where you felt threatened, hurt or afraid?No   Does patient need assistance? Functional Status Self care Ambulation Normal   Primary Care Provider:  Joaquin Courts  MD  CC:  PATIET STATES SHE IS NOT HERE FOR RESULTS / SHE SAYS SHE IS HERE FOR X-RAY.  History of Present Illness: Patient is a 70 year old female who presented on prior visit with concerns about left calf pain.  She noticed the pain a few weeks back and also the presence of "a kernel behind my leg."  She states that the pain hurts when she is walking for a while and gets better with rest and that her leg had also swollen up at that time.  She denied fever, chills, redness in the area, or warmth over the mass.  She tried ibuprofen and Vicodin which did not help the pain. She stated that she has a history of cellulitis and clots in both legs but is unable to provide any further details.  She was concerned because she is scheduled to have surgery to fix hammer toes on her left foot on 12/04/09.  MRI and Dopplets were done and she is here today to discuss the results.   Of note her blood pressure is elevated today.  She reports that she has been taking her medicines for the last month.  Denies headaches, nausea,  vomiting, spots in her vision, chest pain, or shortness of breath.   Preventive Screening-Counseling & Management  Alcohol-Tobacco     Smoking Status: current     Smoking Cessation Counseling: yes     Packs/Day: 1/2 pp week     Year Started: over 20 years ago  Caffeine-Diet-Exercise     Does Patient Exercise: yes     Type of exercise: walking/exercise room     Times/week: 2  Allergies: 1)  Codeine 2)  Morphine  Review of Systems       Per HPI  Physical Exam  General:  Well-developed,well-nourished,in no acute distress; alert,appropriate and cooperative throughout examination Mouth:  pharynx pink and moist.   Lungs:  Normal respiratory effort, chest expands symmetrically. Lungs are clear to auscultation, no crackles or wheezes. Heart:  Normal rate and regular rhythm. S1 and S2 normal without gallop, murmur, click, rub or other extra sounds. Abdomen:  Bowel sounds positive,abdomen soft and non-tender without masses, organomegaly or hernias noted. Msk:  normal ROM, no joint tenderness, no joint swelling, no joint warmth, and no redness over joints.   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  Pulses:  2+ Extremities:  No edema Neurologic:  No cranial nerve deficits noted. Station and gait are normal. Plantar reflexes are down-going bilaterally. DTRs are symmetrical throughout. Sensory, motor and coordinative functions appear intact. Skin:  No rash or other skin lesion in her lower back.  Psych:  Cognition and judgment appear intact. Alert and cooperative with normal attention span and concentration. No apparent delusions, illusions, hallucinations   Impression & Recommendations:  Problem # 1:  CALF PAIN, LEFT (ICD-729.5) Patient was seen for palpalble mass in calf.  MRI of LE: Palpable abnormality corresponds with a tiny enhancing area most consistent with hemangioma Doppler: No evidence of deep vein or superficial thrombosis involving the left lower extremity and  right common femoral vein.  No rx needed, discussed the results with pt today.   Problem # 2:  TOBACCO ABUSE (ICD-305.1)  Her updated medication list for this problem includes:    Nicoderm Cq 14 Mg/24hr Pt24 (Nicotine) .Marland Kitchen... Place one patch on skin daily  Encouraged smoking cessation and discussed different methods for smoking cessation.   Problem # 3:  HYPERLIPIDEMIA NEC/NOS (ICD-272.4)  Well controlled on current treatment, No new changes made today, Will continue to monitor.   Her updated medication list for this problem includes:    Simvastatin 40 Mg Tabs (Simvastatin) .Marland Kitchen... Take 1 pill by mouth daily.  Labs Reviewed: SGOT: 15 (08/16/2009)   SGPT: 17 (08/16/2009)   HDL:47 (08/16/2009), 60 (02/02/2009)  LDL:96 (08/16/2009), 101 (99/24/2683)  Chol:165 (08/16/2009), 182 (02/02/2009)  Trig:108 (08/16/2009), 105 (02/02/2009)  Her updated medication list for this problem includes:    Simvastatin 40 Mg Tabs (Simvastatin) .Marland Kitchen... Take 1 pill by mouth daily.  Problem # 4:  HYPERTENSION (ICD-401.9)  Blood pressure, remains elevated, will add hctz to lisinopril. and bring back in 1-2 weeks for bmet.   Her updated medication list for this problem includes:    Lisinopril-hydrochlorothiazide 20-12.5 Mg Tabs (Lisinopril-hydrochlorothiazide) .Marland Kitchen... Take 1 tablet by mouth once a day    Metoprolol Tartrate 50 Mg Tabs (Metoprolol tartrate) .Marland Kitchen... Take 1 tablet by mouth two times a day  BP today: 146/80 Prior BP: 185/91 (11/28/2009)  Labs Reviewed: K+: 4.3 (11/28/2009) Creat: : 0.95 (11/28/2009)   Chol: 165 (08/16/2009)   HDL: 47 (08/16/2009)   LDL: 96 (08/16/2009)   TG: 108 (08/16/2009)  Her updated medication list for this problem includes:    Lisinopril 20 Mg Tabs (Lisinopril) .Marland Kitchen... Take 1 tablet by mouth once a day for blood pressure    Metoprolol Tartrate 50 Mg Tabs (Metoprolol tartrate) .Marland Kitchen... Take 1 tablet by mouth two times a day  Problem # 5:  COPD (ICD-496)  breathing at baseline,  contine current rx.   Her updated medication list for this problem includes:    Advair Diskus 100-50 Mcg/dose Misc (Fluticasone-salmeterol) ..... Inhale twice a day.    Spiriva Handihaler 18 Mcg Caps (Tiotropium bromide monohydrate) .Marland Kitchen... Take 1 pill inhaler every day.  Her updated medication list for this problem includes:    Advair Diskus 100-50 Mcg/dose Misc (Fluticasone-salmeterol) ..... Inhale twice a day.    Spiriva Handihaler 18 Mcg Caps (Tiotropium bromide monohydrate) .Marland Kitchen... Take 1 pill inhaler every day.  Complete Medication List: 1)  Lisinopril-hydrochlorothiazide 20-12.5 Mg Tabs (Lisinopril-hydrochlorothiazide) .... Take 1 tablet by mouth once a day 2)  Simvastatin 40 Mg Tabs (Simvastatin) .... Take 1 pill by mouth daily. 3)  Advair Diskus 100-50 Mcg/dose Misc (Fluticasone-salmeterol) .... Inhale twice a day. 4)  Spiriva Handihaler 18 Mcg Caps (Tiotropium bromide  monohydrate) .... Take 1 pill inhaler every day. 5)  Vicodin 5-500 Mg Tabs (Hydrocodone-acetaminophen) .... Take 1 tablet 2 times daily as needed for pain 6)  Metoprolol Tartrate 50 Mg Tabs (Metoprolol tartrate) .... Take 1 tablet by mouth two times a day 7)  Aspir-low 81 Mg Tbec (Aspirin) .... Take 1 tablet by mouth once a day  Patient Instructions: 1)  Please schedule a follow-up appointment in 1-2 weeks. Prescriptions: LISINOPRIL-HYDROCHLOROTHIAZIDE 20-12.5 MG TABS (LISINOPRIL-HYDROCHLOROTHIAZIDE) Take 1 tablet by mouth once a day  #30 x 0   Entered and Authorized by:   Darnelle Maffucci MD   Signed by:   Darnelle Maffucci MD on 12/12/2009   Method used:   Electronically to        [facility] (retail)       [address]       [place], [place]  [id number]       Ph: [phone]       Fax: [phone]   RxID:   [id number]    Prevention & Chronic Care Immunizations   Influenza vaccine: Fluvax 3+  (02/02/2009)   Influenza vaccine deferral: Not available  (11/28/2009)    Tetanus  booster: Not documented   Td booster deferral: Deferred  (11/28/2009)    Pneumococcal vaccine: Not documented   Pneumococcal vaccine deferral: Deferred  (11/28/2009)    H. zoster vaccine: Not documented   H. zoster vaccine deferral: Not indicated  (08/16/2009)  Colorectal Screening   Hemoccult: Not documented   Hemoccult action/deferral: Not indicated  (08/16/2009)    Colonoscopy: Not documented   Colonoscopy action/deferral: Deferred  (11/28/2009)  Other Screening   Pap smear: Not documented   Pap smear action/deferral: Deferred-3 yr interval  (08/16/2009)    Mammogram: Normal  (04/12/2005)   Mammogram action/deferral: Deferred-2 yr interval  (08/16/2009)    DXA bone density scan: Not documented   DXA bone density action/deferral: Refused  (08/16/2009)   Smoking status: current  (12/12/2009)   Smoking cessation counseling: yes  (12/12/2009)  Lipids   Total Cholesterol: 165  (08/16/2009)   Lipid panel action/deferral: Lipid Panel ordered   LDL: 96  (08/16/2009)   LDL Direct: Not documented   HDL: 47  (08/16/2009)   Triglycerides: 108  (08/16/2009)    SGOT (AST): 15  (08/16/2009)   SGPT (ALT): 17  (08/16/2009)   Alkaline phosphatase: 72  (08/16/2009)   Total bilirubin: 0.6  (08/16/2009)    Lipid flowsheet reviewed?: Yes   Progress toward LDL goal: At goal  Hypertension   Last Blood Pressure: 146 / 80  (12/12/2009)   Serum creatinine: 0.95  (11/28/2009)   Serum potassium 4.3  (11/28/2009)    Hypertension flowsheet reviewed?: Yes   Progress toward BP goal: Unchanged  Self-Management Support :   Personal Goals (by the next clinic visit) :      Personal blood pressure goal: 140/90  (02/02/2009)     Personal LDL goal: 130  (02/02/2009)    Patient will work on the following items until the next clinic visit to reach self-care goals:     Medications and monitoring: bring all of my medications to every visit  (12/12/2009)     Eating: eat more vegetables, use fresh  or frozen vegetables, eat foods that are low in salt, eat baked foods instead of fried foods, eat fruit for snacks and desserts, limit or avoid alcohol  (12/12/2009)     Activity: take a 30 minute walk every day  (12/12/2009)  Other: EXERCISE  BIKE  (02/02/2009)    Hypertension self-management support: Resources for patients handout, Written self-care plan  (12/12/2009)   Hypertension self-care plan printed.    Lipid self-management support: Resources for patients handout  (12/12/2009)        Resource handout printed.

## 2010-06-11 NOTE — Assessment & Plan Note (Signed)
Summary: HFU-PER DR [facility](WILSON)/CF   Vital Signs:  Patient profile:   70 year old female Height:      61.1 inches (155.19 cm) Weight:      165.4 pounds (75.18 kg) BMI:     31.26 Temp:     97.1 degrees F (36.17 degrees C) oral Pulse rate:   77 / minute BP sitting:   145 / 86  (left arm)  Vitals Entered By: [name] Ditzler RN (August 16, 2009 2:02 PM) Is Patient Diabetic? No Pain Assessment Patient in pain? no      Nutritional Status BMI of > 30 = obese Nutritional Status Detail appetite good  Have you ever been in a relationship where you felt threatened, hurt or afraid?denies   Does patient need assistance? Functional Status Self care Ambulation Normal Comments HFU - better. Has been out of meds about 2 weeks, Wants to change pharm to [facility] for $ 4.00.   Primary Care Provider:  Joaquin Courts  MD   History of Present Illness: 70 yo female with PMH outlined below presents to [facility] for regular follow up appointment. She has no concerns at the time. No recent sicknesses or hospitalizaitons. No episodes of chest pain, SOB, palpitations. No specific abdominal or urinary concerns. No recent changes in appetite, weight, sleep patterns, mood.    Depression History:      The patient denies a depressed mood most of the day and a diminished interest in her usual daily activities.  The patient denies significant weight loss, significant weight gain, insomnia, hypersomnia, psychomotor agitation, psychomotor retardation, fatigue (loss of energy), feelings of worthlessness (guilt), impaired concentration (indecisiveness), and recurrent thoughts of death or suicide.        The patient denies that she feels like life is not worth living, denies that she wishes that she were dead, and denies that she has thought about ending her life.         Preventive Screening-Counseling & Management  Alcohol-Tobacco     Smoking Status: current     Smoking Cessation Counseling: yes  Packs/Day: 1/2 pp week     Year Started: over 20 years ago  Caffeine-Diet-Exercise     Does Patient Exercise: yes     Type of exercise: walking/exercise room     Times/week: 2  Problems Prior to Update: 1)  Pruritus  (ICD-698.9) 2)  Cerumen Impaction  (ICD-380.4) 3)  COPD  (ICD-496) 4)  Hyperlipidemia Nec/nos  (ICD-272.4) 5)  Deafness  (ICD-389.9) 6)  Blindness  (ICD-369.4) 7)  Tobacco Abuse  (ICD-305.1) 8)  Hypertension  (ICD-401.9)  Medications Prior to Update: 1)  Prinivil 10 Mg  Tabs (Lisinopril) .... Take 1 Pill By Mouth Daily. 2)  Simvastatin 40 Mg  Tabs (Simvastatin) .... Take 1 Pill By Mouth Daily. 3)  Advair Diskus 100-50 Mcg/dose Misc (Fluticasone-Salmeterol) .... Inhale Twice A Day. 4)  Spiriva Handihaler 18 Mcg  Caps (Tiotropium Bromide Monohydrate) .... Take 1 Pill Inhaler Every Day. 5)  Benadryl Maximum Strength 2 % Crea (Diphenhydramine Hcl) .... Apply To The Affected Area 3-4 Times As Needed For Itching 6)  Vicodin 5-500 Mg Tabs (Hydrocodone-Acetaminophen) .... Take 1 Tablet 2 Times Daily As Needed For Pain 7)  Metoprolol Tartrate 50 Mg Tabs (Metoprolol Tartrate) .... Take 1 Tablet By Mouth Two Times A Day 8)  Aspir-Low 81 Mg Tbec (Aspirin) .... Take 1 Tablet By Mouth Once A Day 9)  Nicoderm Cq 14 Mg/24hr Pt24 (Nicotine) .... Place One Patch On  Skin Daily 10)  Reglan 5 Mg Tabs (Metoclopramide Hcl) .... Take 1 Tablet By Mouth Two Times A Day 30 Minutes Before Meals  Current Medications (verified): 1)  Prinivil 10 Mg  Tabs (Lisinopril) .... Take 1 Pill By Mouth Daily. 2)  Simvastatin 40 Mg  Tabs (Simvastatin) .... Take 1 Pill By Mouth Daily. 3)  Advair Diskus 100-50 Mcg/dose Misc (Fluticasone-Salmeterol) .... Inhale Twice A Day. 4)  Spiriva Handihaler 18 Mcg  Caps (Tiotropium Bromide Monohydrate) .... Take 1 Pill Inhaler Every Day. 5)  Benadryl Maximum Strength 2 % Crea (Diphenhydramine Hcl) .... Apply To The Affected Area 3-4 Times As Needed For Itching 6)  Vicodin  5-500 Mg Tabs (Hydrocodone-Acetaminophen) .... Take 1 Tablet 2 Times Daily As Needed For Pain 7)  Metoprolol Tartrate 50 Mg Tabs (Metoprolol Tartrate) .... Take 1 Tablet By Mouth Two Times A Day 8)  Aspir-Low 81 Mg Tbec (Aspirin) .... Take 1 Tablet By Mouth Once A Day 9)  Nicoderm Cq 14 Mg/24hr Pt24 (Nicotine) .... Place One Patch On Skin Daily 10)  Reglan 5 Mg Tabs (Metoclopramide Hcl) .... Take 1 Tablet By Mouth Two Times A Day 30 Minutes Before Meals  Allergies: 1)  ! 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 Mother and MGM died of liver CA Maternal uncle had colon CA   Social History: Packs/Day:  1/2 pp week  Review of Systems       per HPI  Physical Exam  General:  Well-developed,well-nourished,in no acute distress; alert,appropriate and cooperative throughout examination Ears:  hearing aid in both ears Neck:  No deformities, masses, or tenderness noted. Lungs:  Normal respiratory effort, chest expands symmetrically. Lungs are clear to auscultation, no crackles or wheezes. Heart:  Normal rate and regular rhythm. S1 and S2 normal without gallop, murmur, click, rub or other extra sounds. Abdomen:  Bowel sounds positive,abdomen soft and non-tender without masses, organomegaly or hernias noted. Neurologic:  No cranial nerve deficits noted. Station and gait are normal. Plantar reflexes are  down-going bilaterally. DTRs are symmetrical throughout. Sensory, motor and coordinative functions appear intact. Cervical Nodes:  No lymphadenopathy noted Psych:  Cognition and judgment appear intact. Alert and cooperative with normal attention span and concentration. No apparent delusions, illusions, hallucinations   Impression & Recommendations:  Problem # 1:  HYPERLIPIDEMIA NEC/NOS (ICD-272.4)  Will check FLP today as well as cmet and will readjust the regimen as indicated.  Her updated medication list for this problem includes:    Simvastatin 40 Mg Tabs (Simvastatin) .Marland Kitchen... Take 1 pill by mouth daily.  Labs Reviewed: SGOT: 10 (02/02/2009)   SGPT: <8 U/L (02/02/2009)   HDL:60 (02/02/2009), 55 (04/17/2008)  LDL:101 (02/02/2009), 128 (04/17/2008)  Chol:182 (02/02/2009), 205 (04/17/2008)  Trig:105 (02/02/2009), 110 (04/17/2008)  Orders: T-Lipid Profile ([address]45-40981) T-Comprehensive Metabolic Panel ([id number])  Problem # 2:  COPD (ICD-496) Well controlled on current regimen, no wheezing and no recent sicknesses. Will continue the same regimen.  Her updated medication list for  this problem includes:    Advair Diskus 100-50 Mcg/dose Misc (Fluticasone-salmeterol) ..... Inhale twice a day.    Spiriva Handihaler 18 Mcg Caps (Tiotropium bromide monohydrate) .Marland Kitchen... Take 1 pill inhaler every day.  Problem # 3:  TOBACCO ABUSE (ICD-305.1)  Her updated medication list for this problem includes:    Nicoderm Cq 14 Mg/24hr Pt24 (Nicotine) .Marland Kitchen... Place one patch on skin daily  Encouraged smoking cessation and discussed different methods for smoking cessation.   Problem # 4:  HYPERTENSION (ICD-401.9) At her baseline but slightly above the goal, will continue to monitor but will have to increase the dose of prinivil if BP continues to stay high. Her updated medication list for this problem includes:    Prinivil 10 Mg Tabs (Lisinopril) .Marland Kitchen... Take 1 pill by mouth daily.    Metoprolol Tartrate 50 Mg  Tabs (Metoprolol tartrate) .Marland Kitchen... Take 1 tablet by mouth two times a day  Orders: T-Comprehensive Metabolic Panel ([id number])  BP today: 145/86 Prior BP: 145/87 (02/02/2009)  Labs Reviewed: K+: 4.2 (02/02/2009) Creat: : 1.13 (02/02/2009)   Chol: 182 (02/02/2009)   HDL: 60 (02/02/2009)   LDL: 101 (02/02/2009)   TG: 105 (02/02/2009)  Complete Medication List: 1)  Prinivil 10 Mg Tabs (Lisinopril) .... Take 1 pill by mouth daily. 2)  Simvastatin 40 Mg Tabs (Simvastatin) .... Take 1 pill by mouth daily. 3)  Advair Diskus 100-50 Mcg/dose Misc (Fluticasone-salmeterol) .... Inhale twice a day. 4)  Spiriva Handihaler 18 Mcg Caps (Tiotropium bromide monohydrate) .... Take 1 pill inhaler every day. 5)  Benadryl Maximum Strength 2 % Crea (Diphenhydramine hcl) .... Apply to the affected area 3-4 times as needed for itching 6)  Vicodin 5-500 Mg Tabs (Hydrocodone-acetaminophen) .... Take 1 tablet 2 times daily as needed for pain 7)  Metoprolol Tartrate 50 Mg Tabs (Metoprolol tartrate) .... Take 1 tablet by mouth two times a day 8)  Aspir-low 81 Mg Tbec (Aspirin) .... Take 1 tablet by mouth once a day 9)  Nicoderm Cq 14 Mg/24hr Pt24 (Nicotine) .... Place one patch on skin daily 10)  Reglan 5 Mg Tabs (Metoclopramide hcl) .... Take 1 tablet by mouth two times a day 30 minutes before meals  Patient Instructions: 1)  Please schedule a follow-up appointment in 3 months. 2)  Please check your blood pressure regularly, if it is >170 please call clinic at [phone] 3)  Tobacco is very bad for your health and your loved ones! You Should stop smoking!. 4)  Stop Smoking Tips: Choose a Quit date. Cut down before the Quit date. decide what you will do as a substitute when you feel the urge to smoke(gum,toothpick,exercise). Prescriptions: REGLAN 5 MG TABS (METOCLOPRAMIDE HCL) Take 1 tablet by mouth two times a day 30 minutes before meals  #60 x 3   Entered and Authorized by:   Mliss Sax MD   Signed by:   Mliss Sax MD on 08/16/2009   Method used:   Electronically to        [facility] (retail)       [address]       [place], [place]  [id number]       Ph: [phone]       Fax: [phone]   RxID:   [id number] NICODERM CQ 14 MG/24HR PT24 (NICOTINE) Place one patch on skin daily  #42 x 3   Entered and Authorized by:   Mliss Sax MD   Signed by:   Mliss Sax MD on  08/16/2009   Method used:   Electronically to        [facility] (retail)       [address]       [place], [place]  [id number]       Ph: [phone]       Fax: [phone]   RxID:   [id number] ASPIR-LOW 81 MG TBEC (ASPIRIN) Take 1 tablet by mouth once a day  #31 x 6   Entered and Authorized by:   Mliss Sax MD   Signed by:   Mliss Sax MD on 08/16/2009   Method used:   Electronically to        [facility] (retail)       [address]       [place], [place]  [address]       Ph: [phone]       Fax: [phone]   RxID:   [id number] METOPROLOL TARTRATE 50 MG TABS (METOPROLOL TARTRATE) Take 1 tablet by mouth two times a day  #62 x 3   Entered and Authorized by:   Mliss Sax MD   Signed by:   Mliss Sax MD on 08/16/2009   Method used:   Electronically to        [facility] (retail)       [address]       [place], [place]  [id number]       Ph: [phone]       Fax: [phone]   RxID:   [id number] SPIRIVA HANDIHALER 18 MCG  CAPS (TIOTROPIUM BROMIDE MONOHYDRATE) take 1 pill inhaler every day.  #1 mo supply x 11   Entered and Authorized by:   Mliss Sax MD   Signed by:   Mliss Sax MD on 08/16/2009   Method used:   Electronically to        [facility] (retail)       [address]       [place], [place]  [id number]       Ph: [phone]       Fax: [phone]   RxID:   [id number] ADVAIR DISKUS 100-50 MCG/DOSE MISC (FLUTICASONE-SALMETEROL) Inhale  twice a day.  #1 x 11   Entered and Authorized by:   Mliss Sax MD   Signed by:   Mliss Sax MD on 08/16/2009   Method used:   Electronically to        [facility] (retail)       [address]       [place], [place]  [id number]       Ph: [phone]       Fax: [phone]   RxID:   [id number] SIMVASTATIN 40 MG  TABS (SIMVASTATIN) take 1 pill by mouth daily.  #30 x 11   Entered and Authorized by:   Mliss Sax MD   Signed by:   Mliss Sax MD on 08/16/2009   Method used:   Electronically to        [facility] (retail)       [address]       [place], [place]  [address]       Ph: [phone]       Fax: [phone]   RxID:   [id number] PRINIVIL 10 MG  TABS (LISINOPRIL) take 1 pill by mouth daily.  #30 x 11   Entered and Authorized by:  Mliss Sax MD   Signed by:   Mliss Sax MD on 08/16/2009   Method used:   Electronically to        [facility] (retail)       [address]       [place], [place]  [address]       Ph: [phone]       Fax: [phone]   RxID:   [id number]  Process Orders Check Orders Results:     [facility]: Check successful Order queued for requisitioning for [facility]: August 16, 2009 2:26 PM  Tests Sent for requisitioning (August 16, 2009 2:26 PM):     08/16/2009: [facility] -- T-Lipid Profile [id number] (signed)     08/16/2009: [facility] -- T-Comprehensive Metabolic Panel [id number] (signed)    Prevention & Chronic Care Immunizations   Influenza vaccine: Fluvax 3+  (02/02/2009)   Influenza vaccine deferral: Not indicated  (08/16/2009)    Tetanus booster: Not documented   Td booster deferral: Not indicated  (08/16/2009)    Pneumococcal vaccine: Not documented   Pneumococcal vaccine deferral: Not indicated  (08/16/2009)    H. zoster vaccine: Not documented   H. zoster vaccine deferral:  Not indicated  (08/16/2009)  Colorectal Screening   Hemoccult: Not documented   Hemoccult action/deferral: Not indicated  (08/16/2009)    Colonoscopy: Not documented   Colonoscopy action/deferral: Not indicated  (08/16/2009)  Other Screening   Pap smear: Not documented   Pap smear action/deferral: Deferred-3 yr interval  (08/16/2009)    Mammogram: Normal  (12/19/202006)   Mammogram action/deferral: Deferred-2 yr interval  (08/16/2009)    DXA bone density scan: Not documented   DXA bone density action/deferral: Refused  (08/16/2009)   Smoking status: current  (08/16/2009)   Smoking cessation counseling: yes  (08/16/2009)  Lipids   Total Cholesterol: 182  (02/02/2009)   Lipid panel action/deferral: Lipid Panel ordered   LDL: 101  (02/02/2009)   LDL Direct: Not documented   HDL: 60  (02/02/2009)   Triglycerides: 105  (02/02/2009)    SGOT (AST): 10  (02/02/2009)   SGPT (ALT): <8 U/L  (02/02/2009) CMP ordered    Alkaline phosphatase: 87  (02/02/2009)   Total bilirubin: 0.5  (02/02/2009)    Lipid flowsheet reviewed?: Yes   Progress toward LDL goal: At goal  Hypertension   Last Blood Pressure: 145 / 86  (08/16/2009)   Serum creatinine: 1.13  (02/02/2009)   Serum potassium 4.2  (02/02/2009) CMP ordered     Hypertension flowsheet reviewed?: Yes   Progress toward BP goal: Unchanged  Self-Management Support :   Personal Goals (by the next clinic visit) :      Personal blood pressure goal: 140/90  (02/02/2009)     Personal LDL goal: 130  (02/02/2009)    Patient will work on the following items until the next clinic visit to reach self-care goals:     Medications and monitoring: take my medicines every day, check my blood pressure  (02/02/2009)     Eating: eat more vegetables, use fresh or frozen vegetables, eat foods that are low in salt, eat fruit for snacks and desserts, limit or avoid alcohol  (08/16/2009)     Activity: take a 30 minute walk every day  (08/16/2009)      Other: EXERCISE  BIKE  (02/02/2009)    Hypertension self-management support: Written self-care plan, Education handout, Resources for patients handout  (08/16/2009)   Hypertension self-care plan  printed.   Hypertension education handout printed    Lipid self-management support: Written self-care plan, Education handout, Resources for patients handout  (08/16/2009)   Lipid self-care plan printed.   Lipid education handout printed      Resource handout printed.

## 2010-06-11 NOTE — Assessment & Plan Note (Signed)
Summary: FOOT /(BOWERS)SB.   Vital Signs:  Patient profile:   70 year old female Height:      61.1 inches Weight:      160.970 pounds BMI:     30.43 O2 Sat:      98 % Temp:     98.7 degrees F oral Pulse rate:   70 / minute BP sitting:   140 / 80  (right arm)  Vitals Entered By: Filomena Jungling NT II (January 22, 2010 8:58 AM) CC: follow-up visit Is Patient Diabetic? No Nutritional Status BMI of > 30 = obese  Have you ever been in a relationship where you felt threatened, hurt or afraid?No   Does patient need assistance? Functional Status Self care Ambulation Normal   Primary Care Provider:  Joaquin Courts  MD  CC:  follow-up visit.  History of Present Illness: Ms. Nieblas is a 70 year old woman with pmh significant for HTN, HLD, COPD, who was recently seen in clinic for calf and foot pain. Patient presents today for follow up.   1) Left foot surgery scheduled for Thursday Sept. 15, 2011 - patient states she needs a pre-op clearance  2) COPD - not on home O2. Stable. Denies sob, cough, wheezing.   3) Tobacco abuse - on nicotine patch, but stopped using it because it made her smoke her more. Currently smoking 1/2 ppd.   4) HTN - Was recently started on Lisinopril-HCTZ for HTN.   Preventive Screening-Counseling & Management  Alcohol-Tobacco     Smoking Status: current     Smoking Cessation Counseling: yes     Packs/Day: 1/2 pp week     Year Started: over 20 years ago  Caffeine-Diet-Exercise     Does Patient Exercise: yes     Type of exercise: walking/exercise room     Times/week: 2  Current Medications (verified): 1)  Lisinopril-Hydrochlorothiazide 20-12.5 Mg Tabs (Lisinopril-Hydrochlorothiazide) .... Take 1 Tablet By Mouth Once A Day 2)  Simvastatin 40 Mg  Tabs (Simvastatin) .... Take 1 Pill By Mouth Daily. 3)  Advair Diskus 100-50 Mcg/dose Misc (Fluticasone-Salmeterol) .... Inhale Twice A Day. 4)  Spiriva Handihaler 18 Mcg  Caps (Tiotropium Bromide Monohydrate)  .... Take 1 Pill Inhaler Every Day. 5)  Vicodin 5-500 Mg Tabs (Hydrocodone-Acetaminophen) .... Take 1 Tablet 2 Times Daily As Needed For Pain 6)  Metoprolol Tartrate 50 Mg Tabs (Metoprolol Tartrate) .... Take 1 Tablet By Mouth Two Times A Day 7)  Aspir-Low 81 Mg Tbec (Aspirin) .... Take 1 Tablet By Mouth Once A Day 8)  Nicoderm Cq 14 Mg/24hr Pt24 (Nicotine) .... 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   Head:  normocephalic and atraumatic.   Eyes:  vision grossly intact, pupils equal, pupils round, and pupils reactive to light.   Neck:  supple.   Lungs:  normal respiratory effort and  normal breath sounds.   Heart:  normal rate, regular rhythm, no murmur, no gallop, and no rub.   Abdomen:  soft, non-tender, normal bowel sounds, and no distention.   Msk:  normal ROM.   Pulses:  pulses equal bilaterally Extremities:  no lower extremity edema present Neurologic:  alert & oriented X3.   Skin:  turgor normal, color normal, and no rashes.   Psych:  Oriented X3.     Impression & Recommendations:  Problem # 1:  HYPERTENSION (ICD-401.9) Assessment Improved  Blood pressure at goal with addition of lisinopril-hctz. Will check bmet to evaluate lytes and renal function.  Will continue current regimen.   Her updated medication list  for this problem includes:    Lisinopril-hydrochlorothiazide 20-12.5 Mg Tabs (Lisinopril-hydrochlorothiazide) .Marland Kitchen... Take 1 tablet by mouth once a day    Metoprolol Tartrate 50 Mg Tabs (Metoprolol tartrate) .Marland Kitchen... Take 1 tablet by mouth two times a day  Orders: T-Basic Metabolic Panel ([id number])  BP today: 170/80 On manual recheck it was 140/80.  Prior BP: 146/80 (12/12/2009)  Labs Reviewed: K+: 4.3 (11/28/2009) Creat: : 0.95 (11/28/2009)   Chol: 165 (08/16/2009)   HDL: 47 (08/16/2009)   LDL: 96 (08/16/2009)   TG: 108 (08/16/2009)  Problem # 2:  TOBACCO ABUSE (ICD-305.1) Will increase patch to 21 mg, as patient was receiving this dose while in hospital and states it decreased her urge to smoke.   Her updated medication list for this problem includes:    Nicoderm Cq 21 Mg/24hr Pt24 (Nicotine) .Marland Kitchen... Apply one patch daily  Problem # 3:  COPD (ICD-496) Assessment: Comment Only Stable. No acute flare in COPD currently. Will continue current inhaler regimen and continue to encourage patient to quit smoking. As mentioned in problem 2, will prescribe higher dose of nicotine.   Her updated medication list for this problem includes:    Advair Diskus 100-50 Mcg/dose Misc (Fluticasone-salmeterol) ..... Inhale twice a day.    Spiriva Handihaler 18 Mcg Caps (Tiotropium bromide monohydrate) .Marland Kitchen... Take 1 pill inhaler every day.  Problem # 4:  Preventive Health Care (ICD-V70.0) PNA and flu shot today.   Complete Medication List: 1)  Lisinopril-hydrochlorothiazide 20-12.5 Mg Tabs (Lisinopril-hydrochlorothiazide) .... Take 1 tablet by mouth once a day 2)  Simvastatin 40 Mg Tabs (Simvastatin) .... Take 1 pill by mouth daily. 3)  Advair Diskus 100-50 Mcg/dose Misc (Fluticasone-salmeterol) .... Inhale twice a day. 4)  Spiriva Handihaler 18 Mcg Caps (Tiotropium bromide monohydrate) .... Take 1 pill inhaler every day. 5)  Vicodin 5-500 Mg Tabs (Hydrocodone-acetaminophen) .... Take 1 tablet 2 times daily  as needed for pain 6)  Metoprolol Tartrate 50 Mg Tabs (Metoprolol tartrate) .... Take 1 tablet by mouth two times a day 7)  Aspir-low 81 Mg Tbec (Aspirin) .... Take 1 tablet by mouth once a day 8)  Nicoderm Cq 21 Mg/24hr Pt24 (Nicotine) .... Apply one patch daily  Patient Instructions: 1)  Please schedule a follow-up appointment in 3 months. 2)  Tobacco is very bad for your health and your loved ones! You Should stop smoking!. 3)  Stop Smoking Tips: Choose a Quit date. Cut down before the Quit date. decide what you will do as a substitute when you feel the urge to smoke(gum,toothpick,exercise). Prescriptions: LISINOPRIL-HYDROCHLOROTHIAZIDE 20-12.5 MG TABS (LISINOPRIL-HYDROCHLOROTHIAZIDE) Take 1 tablet by mouth once a day  #31 x 0   Entered and Authorized by:   Melida Quitter MD   Signed by:   Melida Quitter MD on 01/22/2010   Method used:  Electronically to        [facility] (retail)       [address]       [place], [place]  [address]       Ph: [phone]       Fax: [phone]   RxID:   [id number] NICODERM CQ 21 MG/24HR PT24 (NICOTINE) apply one patch daily  #1 box x 3   Entered and Authorized by:   Melida Quitter MD   Signed by:   Melida Quitter MD on 01/22/2010   Method used:   Electronically to        [facility] (retail)       [address]       [place], [place]  [id number]       Ph: [phone]       Fax: [phone]   RxID:   [id number] LISINOPRIL-HYDROCHLOROTHIAZIDE 20-12.5 MG TABS (LISINOPRIL-HYDROCHLOROTHIAZIDE) Take 1 tablet by mouth once a day  #31 x 0   Entered and Authorized by:   Melida Quitter MD   Signed by:   Melida Quitter MD on 01/22/2010   Method used:   Electronically to        [facility] (retail)       [address]       [place], [place]  [address]       Ph: [phone]       Fax: [phone]   RxID:   [id number]   Prevention & Chronic Care Immunizations    Influenza vaccine: Fluvax 3+  (02/02/2009)   Influenza vaccine deferral: Not available  (11/28/2009)    Tetanus booster: Not documented   Td booster deferral: Deferred  (11/28/2009)    Pneumococcal vaccine: Not documented   Pneumococcal vaccine deferral: Deferred  (11/28/2009)    H. zoster vaccine: Not documented   H. zoster vaccine deferral: Not indicated  (08/16/2009)  Colorectal Screening   Hemoccult: Not documented   Hemoccult action/deferral: Not indicated  (08/16/2009)    Colonoscopy: Not documented   Colonoscopy action/deferral: Deferred  (11/28/2009)  Other Screening   Pap smear: Not documented   Pap smear action/deferral: Deferred-3 yr interval  (08/16/2009)    Mammogram: Normal  (2020/05/1704)   Mammogram action/deferral: Deferred-2 yr interval  (08/16/2009)    DXA bone density scan: Not documented   DXA bone density action/deferral: Refused  (08/16/2009)   Smoking status: current  (01/22/2010)   Smoking cessation counseling: yes  (01/22/2010)  Lipids   Total Cholesterol: 165  (08/16/2009)   Lipid panel action/deferral: Lipid Panel ordered   LDL: 96  (08/16/2009)   LDL Direct: Not documented   HDL: 47  (08/16/2009)   Triglycerides: 108  (08/16/2009)    SGOT (AST): 15  (08/16/2009)   SGPT (ALT): 17  (08/16/2009)   Alkaline phosphatase: 72  (08/16/2009)   Total bilirubin: 0.6  (08/16/2009)    Lipid flowsheet reviewed?: Yes   Progress toward LDL goal: At goal  Hypertension   Last Blood Pressure: 140 / 80  (01/22/2010)   Serum creatinine: 0.95  (11/28/2009)   BMP action: Ordered   Serum potassium 4.3  (11/28/2009)    Hypertension flowsheet reviewed?: Yes   Progress toward BP goal: Improved  Self-Management Support :   Personal Goals (by the next clinic visit) :      Personal blood pressure goal: 140/90  (02/02/2009)     Personal LDL goal: 130  (02/02/2009)    Patient will  work on the following items until the next clinic visit to reach self-care  goals:     Medications and monitoring: take my medicines every day, bring all of my medications to every visit  (01/22/2010)     Eating: drink diet soda or water instead of juice or soda, eat more vegetables, use fresh or frozen vegetables, eat foods that are low in salt, eat baked foods instead of fried foods, eat fruit for snacks and desserts  (01/22/2010)     Activity: take a 30 minute walk every day  (01/22/2010)     Other: EXERCISE  BIKE  (02/02/2009)    Hypertension self-management support: Education handout, Resources for patients handout  (01/22/2010)   Hypertension education handout printed    Lipid self-management support: Education handout, Resources for patients handout  (01/22/2010)     Lipid education handout printed      Resource handout printed.   Nursing Instructions: Give Flu vaccine today Give Pneumovax today    Process Orders Check Orders Results:     [facility]: Check successful Tests Sent for requisitioning (January 22, 2010 9:46 AM):     01/22/2010: [facility] -- T-Basic Metabolic Panel [phone] (signed)     Appended Document: FOOT /(BOWERS)SB.   Immunizations Administered:  Influenza Vaccine # 1:    Vaccine Type: Fluvax MCR    Site: left deltoid    Mfr: GlaxoSmithKline    Dose: 0.5 ml    Route: IM    Given by: Angelina Ok RN    Exp. Date: 11/09/2010    Lot #: [id number]    VIS given: 12/04/09 version given January 22, 2010.  Pneumonia Vaccine:    Vaccine Type: Pneumovax    Site: right deltoid    Mfr: Merck    Dose: 0.5 ml    Route: IM    Given by: Angelina Ok RN    Exp. Date: 07/25/2011    Lot #: [id number]    VIS given: 04/16/09 version given January 22, 2010.  Flu Vaccine Consent Questions:    Do you have a history of severe allergic reactions to this vaccine? no    Any prior history of allergic reactions to egg and/or gelatin? no    Do you have a sensitivity to the preservative Thimersol?  no    Do you have a past history of Guillan-Barre Syndrome? no    Do you currently have an acute febrile illness? no    Have you ever had a severe reaction to latex? no    Vaccine information given and explained to patient? yes    Are you currently pregnant? no

## 2010-06-11 NOTE — Miscellaneous (Signed)
Summary: Hospital discharge  Hospital Discharge  Date of admission: 08/08/2009  Date of discharge:08/12/2009  Brief reason for admission/active problems: [name] is a 70 year old woman with PMH significant for recent hospitalization for ileus, HTN, HLD and tobacco abuse who was admitted for small bowel obstruction. During the hospitalization, she was put NG tubing initially, then removed after symptom was better. We advanced her diet, tolerats well and she also has good bowel movement. During the hospitalization, she also had increased troponin up to 0.25, but no chest pain, EKG and 2D-ECHO are unremarkable. Cardiology consulted and suggested to may have outpatient myoview to rule out CAD.   Followup needed: Make referral to Cardiology for [facility].  The medication and problem lists have been updated.  Please see the dictated discharge summary for details.    Updated Prior Medication List: PRINIVIL 10 MG  TABS (LISINOPRIL) take 1 pill by mouth daily. SIMVASTATIN 40 MG  TABS (SIMVASTATIN) take 1 pill by mouth daily. ADVAIR DISKUS 100-50 MCG/DOSE MISC (FLUTICASONE-SALMETEROL) Inhale twice a day. SPIRIVA HANDIHALER 18 MCG  CAPS (TIOTROPIUM BROMIDE MONOHYDRATE) take 1 pill inhaler every day. BENADRYL MAXIMUM STRENGTH 2 % CREA (DIPHENHYDRAMINE HCL) Apply to the affected area 3-4 times as needed for itching VICODIN 5-500 MG TABS (HYDROCODONE-ACETAMINOPHEN) take 1 tablet 2 times daily as needed for pain METOPROLOL TARTRATE 50 MG TABS (METOPROLOL TARTRATE) Take 1 tablet by mouth two times a day ASPIR-LOW 81 MG TBEC (ASPIRIN) Take 1 tablet by mouth once a day  Current Allergies: ! CODEINE  Medications: Removed medication of CIPRO 500 MG TABS (CIPROFLOXACIN HCL) Take 1 tablet by mouth two times a day Added new medication of METOPROLOL TARTRATE 50 MG TABS (METOPROLOL TARTRATE) Take 1 tablet by mouth two times a day - Signed Added new medication of ASPIR-LOW 81 MG TBEC (ASPIRIN) Take 1 tablet by  mouth once a day - Signed Added new medication of NICODERM CQ 14 MG/24HR PT24 (NICOTINE) Place one patch on skin daily - Signed Added new medication of REGLAN 5 MG TABS (METOCLOPRAMIDE HCL) Take 1 tablet by mouth two times a day 30 minutes before meals - Signed Rx of METOPROLOL TARTRATE 50 MG TABS (METOPROLOL TARTRATE) Take 1 tablet by mouth two times a day;  #62 x 3;  Signed;  Entered by: Jackson Latino MD;  Authorized by: Jackson Latino MD;  Method used: Electronically to [facility] [address] [address], [place], [place]  [id number], Ph: [phone], Fax: [phone] Rx of ASPIR-LOW 81 MG TBEC (ASPIRIN) Take 1 tablet by mouth once a day;  #31 x 6;  Signed;  Entered by: Jackson Latino MD;  Authorized by: Jackson Latino MD;  Method used: Electronically to [facility] [address] [address], [place], [place]  [id number], Ph: [phone], Fax: [phone] Rx of NICODERM CQ 14 MG/24HR PT24 (NICOTINE) Place one patch on skin daily;  #42 x 0;  Signed;  Entered by: Laren Everts MD;  Authorized by: Jackson Latino MD;  Method used: Electronically to [facility] [address] [address], [place], [place]  [id number], Ph: [phone], Fax: [phone] Rx of REGLAN 5 MG TABS (METOCLOPRAMIDE HCL) Take 1 tablet by mouth two times a day 30 minutes before meals;  #60 x 2;  Signed;  Entered by: Laren Everts MD;  Authorized by: Jackson Latino MD;  Method used: Electronically to [facility] [address] [address], [place], [place]  [id number], Ph: [phone], Fax: [phone]     Prescriptions:  REGLAN 5 MG TABS (METOCLOPRAMIDE HCL) Take 1 tablet by mouth two times a day 30 minutes before meals  #60 x 2   Entered by:   Laren Everts MD   Authorized by:   Jackson Latino MD   Signed by:   Laren Everts MD on 08/12/2009   Method used:   Electronically to        [facility] (retail)        [address]       [place], [place]  [address]       Ph: [phone]       Fax: [phone]   RxID:   [id number] NICODERM CQ 14 MG/24HR PT24 (NICOTINE) Place one patch on skin daily  #42 x 0   Entered by:   Laren Everts MD   Authorized by:   Jackson Latino MD   Signed by:   Laren Everts MD on 08/12/2009   Method used:   Electronically to        [facility] (retail)       [address]       [place], [place]  [id number]       Ph: [phone]       Fax: [phone]   RxID:   [id number] ASPIR-LOW 81 MG TBEC (ASPIRIN) Take 1 tablet by mouth once a day  #31 x 6   Entered and Authorized by:   Jackson Latino MD   Signed by:   Jackson Latino MD on 08/11/2009   Method used:   Electronically to        [facility] (retail)       [address]       [place], [place]  [address]       Ph: [phone]       Fax: [phone]   RxID:   [id number] METOPROLOL TARTRATE 50 MG TABS (METOPROLOL TARTRATE) Take 1 tablet by mouth two times a day  #62 x 3   Entered and Authorized by:   Jackson Latino MD   Signed by:   Jackson Latino MD on 08/11/2009   Method used:   Electronically to        [facility] (retail)       [address]       [place], [place]  [address]       Ph: [phone]       Fax: [phone]   RxID:   [id number]    Patient Instructions: 1)  Follow up at [facility] clinic on August 16, 2009 @ 2:10pm with Dr. Aldine Contes.  2)  Take all medication as directed.  3)  You can pick up medications at [facility].

## 2010-07-16 ENCOUNTER — Inpatient Hospital Stay (INDEPENDENT_AMBULATORY_CARE_PROVIDER_SITE_OTHER)
Admission: RE | Admit: 2010-07-16 | Discharge: 2010-07-16 | Disposition: A | Discharge status: Home / Self Care | Payer: Medicare Other | Source: Ambulatory Visit | Attending: Emergency Medicine | Admitting: Emergency Medicine

## 2010-07-16 ENCOUNTER — Ambulatory Visit (INDEPENDENT_AMBULATORY_CARE_PROVIDER_SITE_OTHER): Payer: No Typology Code available for payment source

## 2010-07-16 DIAGNOSIS — M542 Cervicalgia: Secondary | ICD-10-CM

## 2010-07-30 LAB — COMPREHENSIVE METABOLIC PANEL
ALT: <8 U/L (ref 0–35)
AST: 13 U/L (ref 0–37)
Alkaline Phosphatase: 83 U/L (ref 39–117)
CO2: 28 mEq/L (ref 19–32)
Calcium: 8.4 mg/dL (ref 8.4–10.5)
GFR calc Af Amer: 58 mL/min — ABNORMAL LOW (ref >60)
Glucose, Bld: 123 mg/dL — ABNORMAL HIGH (ref 70–99)
Potassium: 3.2 mEq/L — ABNORMAL LOW (ref 3.5–5.1)
Sodium: 133 mEq/L — ABNORMAL LOW (ref 135–145)
Total Protein: 6.5 g/dL (ref 6.0–8.3)

## 2010-07-30 LAB — URINALYSIS, ROUTINE W REFLEX MICROSCOPIC
Bilirubin Urine: NEGATIVE
Glucose, UA: NEGATIVE mg/dL
Specific Gravity, Urine: 1.022 (ref 1.005–1.030)
pH: 5.5 (ref 5.0–8.0)

## 2010-07-30 LAB — PROTIME-INR
INR: 0.97 (ref 0.00–1.49)
Prothrombin Time: 12.8 seconds (ref 11.6–15.2)

## 2010-07-30 LAB — CBC
Hemoglobin: 14.6 g/dL (ref 12.0–15.0)
RBC: 4.42 MIL/uL (ref 3.87–5.11)

## 2010-07-30 LAB — URINE MICROSCOPIC-ADD ON

## 2010-07-30 LAB — URINE CULTURE: Colony Count: 60000

## 2010-07-30 LAB — BASIC METABOLIC PANEL
CO2: 29 mEq/L (ref 19–32)
CO2: 31 mEq/L (ref 19–32)
Calcium: 8.3 mg/dL — ABNORMAL LOW (ref 8.4–10.5)
Calcium: 8.4 mg/dL (ref 8.4–10.5)
Calcium: 8.7 mg/dL (ref 8.4–10.5)
Chloride: 102 mEq/L (ref 96–112)
Creatinine, Ser: 0.91 mg/dL (ref 0.4–1.2)
GFR calc Af Amer: >60 mL/min (ref >60)
GFR calc Af Amer: >60 mL/min (ref >60)
GFR calc Af Amer: >60 mL/min (ref >60)
GFR calc non Af Amer: >60 mL/min (ref >60)
GFR calc non Af Amer: >60 mL/min (ref >60)
Glucose, Bld: 110 mg/dL — ABNORMAL HIGH (ref 70–99)
Glucose, Bld: 136 mg/dL — ABNORMAL HIGH (ref 70–99)
Potassium: 3.7 mEq/L (ref 3.5–5.1)
Potassium: 4 mEq/L (ref 3.5–5.1)
Sodium: 137 mEq/L (ref 135–145)
Sodium: 137 mEq/L (ref 135–145)
Sodium: 140 mEq/L (ref 135–145)

## 2010-07-30 LAB — DIFFERENTIAL
Basophils Relative: 1 % (ref 0–1)
Eosinophils Absolute: 0.1 10*3/uL (ref 0.0–0.7)
Eosinophils Relative: 1 % (ref 0–5)
Lymphs Abs: 1.4 10*3/uL (ref 0.7–4.0)
Monocytes Relative: 7 % (ref 3–12)
Neutrophils Relative %: 69 % (ref 43–77)

## 2010-07-30 LAB — MAGNESIUM
Magnesium: 2.3 mg/dL (ref 1.5–2.5)
Magnesium: 3 mg/dL — ABNORMAL HIGH (ref 1.5–2.5)

## 2010-07-30 LAB — TSH: TSH: 1.252 u[IU]/mL (ref 0.350–4.500)

## 2010-07-30 LAB — APTT: aPTT: 32 seconds (ref 24–37)

## 2010-07-30 LAB — ETHANOL: Alcohol, Ethyl (B): <5 mg/dL (ref 0–10)

## 2010-07-31 LAB — BASIC METABOLIC PANEL
BUN: 8 mg/dL (ref 6–23)
CO2: 26 mEq/L (ref 19–32)
CO2: 28 mEq/L (ref 19–32)
Calcium: 8.3 mg/dL — ABNORMAL LOW (ref 8.4–10.5)
Chloride: 101 mEq/L (ref 96–112)
Creatinine, Ser: 1.05 mg/dL (ref 0.4–1.2)
Creatinine, Ser: 1.13 mg/dL (ref 0.4–1.2)
GFR calc Af Amer: 57 mL/min — ABNORMAL LOW (ref >60)
GFR calc non Af Amer: 47 mL/min — ABNORMAL LOW (ref >60)
GFR calc non Af Amer: 48 mL/min — ABNORMAL LOW (ref >60)
Potassium: 4.1 mEq/L (ref 3.5–5.1)
Sodium: 137 mEq/L (ref 135–145)

## 2010-07-31 LAB — BRAIN NATRIURETIC PEPTIDE: Pro B Natriuretic peptide (BNP): 31 pg/mL (ref 0.0–100.0)

## 2010-07-31 LAB — CBC
HCT: 36.9 % (ref 36.0–46.0)
Hemoglobin: 12.4 g/dL (ref 12.0–15.0)
MCHC: 33.7 g/dL (ref 30.0–36.0)
MCV: 97.7 fL (ref 78.0–100.0)
Platelets: 172 10*3/uL (ref 150–400)
RBC: 3.75 MIL/uL — ABNORMAL LOW (ref 3.87–5.11)
RDW: 14.1 % (ref 11.5–15.5)
WBC: 5.8 10*3/uL (ref 4.0–10.5)

## 2010-07-31 LAB — HEPATIC FUNCTION PANEL
AST: 17 U/L (ref 0–37)
Albumin: 2.9 g/dL — ABNORMAL LOW (ref 3.5–5.2)
Alkaline Phosphatase: 59 U/L (ref 39–117)
Bilirubin, Direct: 0.2 mg/dL (ref 0.0–0.3)
Total Bilirubin: 0.8 mg/dL (ref 0.3–1.2)

## 2010-07-31 LAB — CARDIAC PANEL(CRET KIN+CKTOT+MB+TROPI)
Relative Index: 2.2 (ref 0.0–2.5)
Relative Index: 2.9 — ABNORMAL HIGH (ref 0.0–2.5)
Total CK: 137 U/L (ref 7–177)
Troponin I: 0.22 ng/mL — ABNORMAL HIGH (ref 0.00–0.06)

## 2010-07-31 LAB — LIPASE, BLOOD: Lipase: 16 U/L (ref 11–59)

## 2010-08-02 LAB — COMPREHENSIVE METABOLIC PANEL
AST: 18 U/L (ref 0–37)
Albumin: 3.4 g/dL — ABNORMAL LOW (ref 3.5–5.2)
Alkaline Phosphatase: 88 U/L (ref 39–117)
Alkaline Phosphatase: 95 U/L (ref 39–117)
BUN: 9 mg/dL (ref 6–23)
CO2: 27 mEq/L (ref 19–32)
Calcium: 9.3 mg/dL (ref 8.4–10.5)
Chloride: 96 mEq/L (ref 96–112)
Creatinine, Ser: 1.09 mg/dL (ref 0.4–1.2)
GFR calc Af Amer: 60 mL/min — ABNORMAL LOW (ref >60)
GFR calc non Af Amer: 49 mL/min — ABNORMAL LOW (ref >60)
Glucose, Bld: 126 mg/dL — ABNORMAL HIGH (ref 70–99)
Potassium: 2.9 mEq/L — ABNORMAL LOW (ref 3.5–5.1)
Potassium: 3.3 mEq/L — ABNORMAL LOW (ref 3.5–5.1)
Total Bilirubin: 0.8 mg/dL (ref 0.3–1.2)
Total Protein: 6.9 g/dL (ref 6.0–8.3)

## 2010-08-02 LAB — T4, FREE: Free T4: 1.16 ng/dL (ref 0.80–1.80)

## 2010-08-02 LAB — URINALYSIS, MICROSCOPIC ONLY
Glucose, UA: NEGATIVE mg/dL
Specific Gravity, Urine: >1.046 — ABNORMAL HIGH (ref 1.005–1.030)
pH: 5.5 (ref 5.0–8.0)

## 2010-08-02 LAB — CBC
HCT: 42.2 % (ref 36.0–46.0)
HCT: 43.3 % (ref 36.0–46.0)
HCT: 46.4 % — ABNORMAL HIGH (ref 36.0–46.0)
Hemoglobin: 13.9 g/dL (ref 12.0–15.0)
Hemoglobin: 14.5 g/dL (ref 12.0–15.0)
MCHC: 33 g/dL (ref 30.0–36.0)
MCHC: 33.5 g/dL (ref 30.0–36.0)
MCV: 97.7 fL (ref 78.0–100.0)
MCV: 98.1 fL (ref 78.0–100.0)
RBC: 4.3 MIL/uL (ref 3.87–5.11)
RBC: 4.75 MIL/uL (ref 3.87–5.11)
RDW: 13.6 % (ref 11.5–15.5)
RDW: 14 % (ref 11.5–15.5)
WBC: 8.7 10*3/uL (ref 4.0–10.5)

## 2010-08-02 LAB — URINALYSIS, ROUTINE W REFLEX MICROSCOPIC
Ketones, ur: NEGATIVE mg/dL
Nitrite: POSITIVE — AB
Protein, ur: NEGATIVE mg/dL
Urobilinogen, UA: 1 mg/dL (ref 0.0–1.0)
pH: 5.5 (ref 5.0–8.0)

## 2010-08-02 LAB — BASIC METABOLIC PANEL
BUN: 7 mg/dL (ref 6–23)
CO2: 29 mEq/L (ref 19–32)
Calcium: 8.3 mg/dL — ABNORMAL LOW (ref 8.4–10.5)
Chloride: 98 mEq/L (ref 96–112)
Creatinine, Ser: 0.91 mg/dL (ref 0.4–1.2)
GFR calc Af Amer: 31 mL/min — ABNORMAL LOW (ref >60)
GFR calc non Af Amer: >60 mL/min (ref >60)
Glucose, Bld: 131 mg/dL — ABNORMAL HIGH (ref 70–99)
Glucose, Bld: 78 mg/dL (ref 70–99)
Potassium: 4.2 mEq/L (ref 3.5–5.1)
Sodium: 138 mEq/L (ref 135–145)

## 2010-08-02 LAB — GLUCOSE, CAPILLARY: Glucose-Capillary: 129 mg/dL — ABNORMAL HIGH (ref 70–99)

## 2010-08-02 LAB — DIFFERENTIAL
Basophils Relative: 0 % (ref 0–1)
Monocytes Relative: 5 % (ref 3–12)
Neutro Abs: 5.6 10*3/uL (ref 1.7–7.7)
Neutrophils Relative %: 79 % — ABNORMAL HIGH (ref 43–77)

## 2010-08-02 LAB — LIPASE, BLOOD: Lipase: 14 U/L (ref 11–59)

## 2010-08-02 LAB — URINE MICROSCOPIC-ADD ON

## 2010-08-02 LAB — LIPID PANEL
HDL: 57 mg/dL (ref >39)
VLDL: 15 mg/dL (ref 0–40)

## 2010-08-02 LAB — HEMOCCULT GUIAC POC 1CARD (OFFICE): Fecal Occult Bld: NEGATIVE

## 2010-08-05 LAB — CBC
MCHC: 33.7 g/dL (ref 30.0–36.0)
MCV: 97.4 fL (ref 78.0–100.0)
Platelets: 174 10*3/uL (ref 150–400)
RBC: 4.82 MIL/uL (ref 3.87–5.11)
RDW: 14.2 % (ref 11.5–15.5)

## 2010-08-05 LAB — URINE CULTURE: Colony Count: 7000

## 2010-08-05 LAB — COMPREHENSIVE METABOLIC PANEL
ALT: 16 U/L (ref 0–35)
AST: 31 U/L (ref 0–37)
CO2: 34 mEq/L — ABNORMAL HIGH (ref 19–32)
Calcium: 9.3 mg/dL (ref 8.4–10.5)
Creatinine, Ser: 1.57 mg/dL — ABNORMAL HIGH (ref 0.4–1.2)
GFR calc Af Amer: 39 mL/min — ABNORMAL LOW (ref >60)
GFR calc non Af Amer: 32 mL/min — ABNORMAL LOW (ref >60)
Sodium: 136 mEq/L (ref 135–145)
Total Protein: 6.7 g/dL (ref 6.0–8.3)

## 2010-08-05 LAB — DIFFERENTIAL
Basophils Absolute: 0.2 10*3/uL — ABNORMAL HIGH (ref 0.0–0.1)
Basophils Relative: 2 % — ABNORMAL HIGH (ref 0–1)
Eosinophils Absolute: 0 10*3/uL (ref 0.0–0.7)
Eosinophils Relative: 0 % (ref 0–5)
Lymphocytes Relative: 21 % (ref 12–46)
Monocytes Absolute: 0.9 10*3/uL (ref 0.1–1.0)

## 2010-08-05 LAB — URINALYSIS, ROUTINE W REFLEX MICROSCOPIC
Nitrite: NEGATIVE
Protein, ur: 30 mg/dL — AB
Urobilinogen, UA: 0.2 mg/dL (ref 0.0–1.0)

## 2010-08-17 LAB — URINALYSIS, ROUTINE W REFLEX MICROSCOPIC
Nitrite: NEGATIVE
Protein, ur: NEGATIVE mg/dL
Specific Gravity, Urine: 1.006 (ref 1.005–1.030)
Urobilinogen, UA: 1 mg/dL (ref 0.0–1.0)

## 2010-08-17 LAB — POCT I-STAT, CHEM 8
BUN: 14 mg/dL (ref 6–23)
Creatinine, Ser: 1.3 mg/dL — ABNORMAL HIGH (ref 0.4–1.2)
Glucose, Bld: 101 mg/dL — ABNORMAL HIGH (ref 70–99)
Hemoglobin: 16 g/dL — ABNORMAL HIGH (ref 12.0–15.0)
Potassium: 5 mEq/L (ref 3.5–5.1)
Sodium: 136 mEq/L (ref 135–145)

## 2010-08-17 LAB — URINE MICROSCOPIC-ADD ON

## 2010-08-17 LAB — WET PREP, GENITAL: Yeast Wet Prep HPF POC: NONE SEEN

## 2010-09-27 NOTE — Op Note (Signed)
NAME:  [name], [name]                        ACCOUNT NO.:  [id number]   MEDICAL RECORD NO.:  [id number]                   PATIENT TYPE:  AMB   LOCATION:  [facility]                                  FACILITY:  [facility]   PHYSICIAN:  Lubertha Basque. Jerl Santos, M.D.             DATE OF BIRTH:  05/08/1942   DATE OF PROCEDURE:  10/04/2002  DATE OF DISCHARGE:                                 OPERATIVE REPORT   PREOPERATIVE DIAGNOSES:  1. Right shoulder rotator cuff tear.  2. Right shoulder impingement.  3. Right shoulder acromioclavicular degeneration.   POSTOPERATIVE DIAGNOSES:  1. Right shoulder rotator cuff tear.  2. Right shoulder impingement.  3. Right shoulder acromioclavicular degeneration.   OPERATION PERFORMED:  1. Right shoulder arthroscopic rotator cuff debridement.  2. Right shoulder arthroscopic acromioplasty.  3. Right shoulder arthroscopic acromioclavicular resection.   SURGEON:  Lubertha Basque. Jerl Santos, M.D.   ASSISTANT:  Prince Rome, P.A.   ANESTHESIA:  General.   INDICATIONS FOR PROCEDURE:  The patient is a 70 year old woman with many  months of right shoulder pain after an accident.  She has persisted with  difficulty despite injections and an exercise program.  She has undergone an  MRI scan which showed a rotator cuff tear which is retracted and exhibits  some signs of atrophy.  She is offered an arthroscopy at this point.  She  has pain at rest and pain with activities.  Informed operative consent was  obtained after discussion of possible complications of reaction to  anesthesia and infection.   DESCRIPTION OF PROCEDURE:  The patient was taken to the operating suite  where general anesthetic was applied without difficulty.  She was positioned  in beach chair position and prepped and draped in the normal sterile  fashion.  After administration of preop IV antibiotics, arthroscopy of the  right shoulder was performed through a total of three portals.  Glenohumeral  joint showed no degenerative change.  She had some tearing of the biceps  consisting of only about 10% of the substance of the tendon and debridement  was done back to stable tissues.  This tendon was dislocated in a slightly  anterior position due to a chronic rotator cuff tear.  She had a retracted  tear involving the entire infraspinatus and supraspinatus.  I could not  easily bring this back over the greater tuberosity.  With the findings of  muscle atrophy and fatty infiltration seen on MRI scan, it seemed best to go  ahead and perform a debridement of this tissue.  This was done back to the  glenoid.  An acromioplasty was done back to a flat surface.  I did some  burring in the lateral position followed by transfer of the bur to the  posterior position.  She also had bone-on-bone contact at the [facility] joint.  A  formal AC decompression was done through the additional anterior portal.  The shoulder  was thoroughly irrigated at the end of the case followed by the  placement of Marcaine with epinephrine plus morphine.  Simple sutures of  nylon were used to loosely reapproximate the portals followed by Adaptic and  a dry gauze dressing with tape.  Estimated blood loss and intraoperative  fluids as well as accurate tourniquet time can be obtained from anesthesia  records.   DISPOSITION:  The patient was extubated in the operating room and taken to  the recovery room in stable condition.  Plans were for the patient  to go  home the same day and to follow up in the office in less than a week.  I  will contact her by phone tonight.                                                 Lubertha Basque Jerl Santos, M.D.    PGD/MEDQ  D:  10/04/2002  T:  10/04/2002  Job:  [id number]

## 2010-09-27 NOTE — Op Note (Signed)
[name], [name]                        ACCOUNT NO.:  [id number]   MEDICAL RECORD NO.:  [id number]                   PATIENT TYPE:  AMB   LOCATION:  DSC                                  FACILITY:  [facility]   PHYSICIAN:  Lubertha Basque. Jerl Santos, M.D.             DATE OF BIRTH:  03/22/1938   DATE OF PROCEDURE:  06/27/2003  DATE OF DISCHARGE:                                 OPERATIVE REPORT   PREOPERATIVE DIAGNOSES:  1. Left shoulder rotator cuff tear.  2. Left shoulder impingement.  3. Left shoulder acromioclavicular pain.  4. Right trigger thumb.   POSTOPERATIVE DIAGNOSES:  1. Left shoulder rotator cuff tear.  2. Left shoulder impingement.  3. Left shoulder acromioclavicular pain.  4. Right trigger thumb.   PROCEDURE:  1. Left shoulder open rotator cuff repair.  2. Left shoulder arthroscopic acromioplasty.  3. Left shoulder arthroscopic AC resection.  4. Right thumb trigger injection.   ANESTHESIA:  General.   SURGEON:  Lubertha Basque. Jerl Santos, M.D.   ASSISTANT:  Lindwood Qua, P.A.   INDICATIONS FOR PROCEDURE:  The patient is a 70 year old woman with a long  history of left shoulder pain. This has persisted despite oral  antiinflammatories and injections.  It is felt that she likely has a massive  rotator cuff tear. She has pain at rest and pain with activities and is  offered an arthroscopy with possible repair.  She also has a trigger thumb  on the opposite side and she was offered an injection of this under  anesthesia.  Informed operative consent was obtained after discussion of the  possible complications of reaction to anesthesia, infection, neurovascular  injury.   DESCRIPTION OF PROCEDURE:  The patient was taken to the operating suite  where a general anesthetic was applied without difficulty. She was  positioned in beach chair position and prepped and draped in normal sterile  fashion. After the administration of preop IV antibiotics, an arthroscopy of  the left  shoulder was performed through a total of three portals. The  glenohumeral joint showed no degenerative change in the biceps tendon and  labral structures appeared intact. She did seem to have a rotator cuff tear  in the supraspinatus portion seen from below.  In the subacromial space, she  also had the same rotator cuff about a centimeter and a half in width at the  greater tuberosity attachment site of the supraspinatus portion of the  rotator cuff. This was minimally retracted.  She did have a prominent  subacromial morphology dressed with an acromioplasty back to a flat surface.  This was done with a bur in the lateral position followed by transfer of the  bur to the posterior position.  A conservative acromioplasty was done. The  [facility] joint did appear to have bone on bone contact consistent with the pain in  that location and a formal __________ compression was done through the  additional anterior portal.  I started to repair the rotator cuff in an  arthroscopic fashion but this was not going well so I converted to a mini  open repair. I made a longitudinal split in the deltoid and dissected down  to the rotator cuff which was easily exposed. I then made a bleeding bed of  bone with a bur and placed a single large absorbable suture anchor by  Arthrex which was 6.5 in diameter.  She was fairly osteoporotic but this  seemed to bite very well.  The two FiberWire sutures which emanated from the  anchor were then passed through the rotator cuff with the free needle in  horizontal mattress fashion to reapproximate the cuff to the bleeding bed of  bone.  Appropriate knots were tied on the top and these were cut.  The  shoulder was irrigated followed by reapproximation of the deltoid split with  #0 Vicryl in interrupted fashion.  The subcutaneous tissue was  reapproximated in two layers with absorbable Vicryl followed by skin closure  with nylon. The portals were loosely closed with nylon as  well.  Adaptic was  placed on the wounds. The shoulder was injected with some Marcaine and  epinephrine at the end of the case. A dry gauze dressing and tape was  applied. Estimated blood loss and intraoperative fluids can be obtained from  the anesthesia records.  We did inject her opposite hand trigger thumb  flexor tendon sheath in the same sitting with 1/2 mL of Depo-Medrol and 1/2  mL of lidocaine. A Band-Aid was applied there.   DISPOSITION:  The patient was extubated in the operating room and taken to  the recovery room in stable condition.  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  We  will see her tomorrow morning prior to discharge.                                               Lubertha Basque Jerl Santos, M.D.    PGD/MEDQ  D:  06/27/2003  T:  06/27/2003  Job:  [id number]

## 2010-09-27 NOTE — Op Note (Signed)
NAMELILLION, [name]                        ACCOUNT NO.:  [id number]   MEDICAL RECORD NO.:  [id number]                   PATIENT TYPE:  AMB   LOCATION:  DSC                                  FACILITY:  [facility]   PHYSICIAN:  Lubertha Basque. Jerl Santos, M.D.             DATE OF BIRTH:  05/08/1942   DATE OF PROCEDURE:  04/18/2003  DATE OF DISCHARGE:                                 OPERATIVE REPORT   PREOPERATIVE DIAGNOSES:  1. Right carpal tunnel syndrome.  2. Right forearm cyst.   POSTOPERATIVE DIAGNOSES:  1. Right carpal tunnel syndrome.  2. Right forearm cyst.   OPERATION PERFORMED:  1. Right carpal tunnel release.  2. Excision of cyst, right forearm.   SURGEON:  Lubertha Basque. Jerl Santos, M.D.   ASSISTANT:  Prince Rome, P.A.   ANESTHESIA:  Bier block, MAC.   INDICATIONS FOR PROCEDURE:  The patient is a 70 year old woman with a long  history of arm pain and hand numbness.  This has persisted despite bracing  and oral anti-inflammatories and rest.  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  She also  notes a cyst on her forearm that she would like removed.  She was offered  carpal tunnel release with removal of her cyst.  Informed operative consent  was obtained after discussion of possible complications of reaction to  anesthesia, infection, recurrence, and neurovascular injury.   DESCRIPTION OF PROCEDURE:  The patient was taken to the operating suite  where Bier block was applied to the level of her upper arm.  The patient was  prepped and draped in the normal sterile fashion and also given some  sedation.  After administration of preop intravenous antibiotics, a small  palmar incision was made ulnar to the thenar flexion crease.  The incision  did not cross the wrist flexion crease.  Dissection was carried down through  the palmar fascia to expose the transverse carpal ligament with was released  under direct  visualization. The dissection was taken distally to the  transverse arch of vessels and proximally through the distal fascia of the  forearm.  The wound was irrigated followed by reapproximation of the skin  with vertical mattresses of nylon.  A separate incision was made along a  cyst on her forearm.  Dissection was carried down to the subcutaneous  structure which was about 0.5 cm in diameter.  This was removed.  This  appeared to be fluid filled and was not sent to pathology.  The wound was  irrigated followed by closure of skin with vertical mattresses of nylon.  Adaptic was placed on both wounds followed by dry gauze.  The tourniquet was  deflated and the fingertips became pink and warm medially.  A splint of  plaster was applied with the wrist in slight extension.  Estimated blood  loss and intraoperative fluids as well as accurate  tourniquet time can be  obtained from anesthesia records.   DISPOSITION:  The patient was extubated in the operating room and taken to  the recovery room in stable condition.  Plans were for the patient to stay  overnight for observation and pain control with probable discharged to home  in the morning.                                               Lubertha Basque Jerl Santos, M.D.    PGD/MEDQ  D:  04/18/2003  T:  04/19/2003  Job:  [id number]

## 2010-10-03 ENCOUNTER — Encounter: Payer: Self-pay | Admitting: Internal Medicine

## 2010-10-03 ENCOUNTER — Ambulatory Visit (INDEPENDENT_AMBULATORY_CARE_PROVIDER_SITE_OTHER): Payer: Self-pay | Admitting: Internal Medicine

## 2010-10-03 VITALS — BP 168/90 | HR 72 | Temp 97.0°F | Ht 64.0 in | Wt 157.9 lb

## 2010-10-03 DIAGNOSIS — H548 Legal blindness, as defined in USA: Secondary | ICD-10-CM

## 2010-10-03 DIAGNOSIS — F17200 Nicotine dependence, unspecified, uncomplicated: Secondary | ICD-10-CM

## 2010-10-03 MED ORDER — NICOTINE 21 MG/24HR TD PT24: 1.0000 | MEDICATED_PATCH | TRANSDERMAL | Status: AC

## 2010-10-03 NOTE — Patient Instructions (Signed)
Please make followup appointment in 4-5 months. Please try to quit smoking and use nicotine patch for that. Please continue taking your inhaler and other medications regularly.

## 2010-10-03 NOTE — Assessment & Plan Note (Signed)
I will sign the papers today for her getting a ride with [employer]. No other complaints today.

## 2010-10-03 NOTE — Progress Notes (Signed)
  Subjective:    Patient ID: [name], female    DOB: 03/22/1938, 70 y.o.   MRN: [id number]  HPI [name] is a pleasant 70 year old woman who comes with her aide to the clinic for getting the papers signed for [employer] ride. 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 She needs aid for doing her ADLs and going out for shopping or anything else. Denies any fever, chills, cough, shortness of breath, abdominal pain, chest pain, headache, dizziness, recent falls, recent weight loss, decreased appetite.    Review of Systems    as per history of present illness Objective:   Physical Exam    Constitutional: Vital signs reviewed.  Patient is a well-developed and well-nourished in no acute distress and cooperative with exam. Alert and oriented x3.  Head: Normocephalic and atraumatic Mouth: no erythema or exudates, MMM Eyes: PERRL, EOMI, conjunctivae normal, No scleral icterus.  Neck: Supple, Trachea midline normal ROM Cardiovascular: RRR, S1 normal, S2 normal, no MRG Pulmonary/Chest: CTAB, no wheezes, rales, or rhonchi Abdominal: Soft. Non-tender, non-distended, bowel sounds are normal, no masses, organomegaly, or guarding present.  GU: no CVA tenderness Musculoskeletal: No joint deformities, erythema, or stiffness, ROM full and no nontender Neurological: A&O x3, Strenght is normal and symmetric bilaterally, cranial nerve II-XII are grossly intact, no focal motor deficit, sensory intact to light touch bilaterally.  Skin: Warm, dry and intact. No rash, cyanosis, or clubbing.       Assessment & Plan:

## 2010-10-10 ENCOUNTER — Encounter: Payer: Self-pay | Admitting: Internal Medicine

## 2010-10-10 ENCOUNTER — Ambulatory Visit (INDEPENDENT_AMBULATORY_CARE_PROVIDER_SITE_OTHER): Payer: Medicaid Other | Admitting: Internal Medicine

## 2010-10-10 VITALS — BP 144/75 | HR 45 | Temp 96.8°F | Wt 163.0 lb

## 2010-10-10 DIAGNOSIS — N760 Acute vaginitis: Secondary | ICD-10-CM | POA: Insufficient documentation

## 2010-10-10 DIAGNOSIS — N390 Urinary tract infection, site not specified: Secondary | ICD-10-CM | POA: Insufficient documentation

## 2010-10-10 LAB — POCT URINALYSIS DIPSTICK
Bilirubin, UA: NEGATIVE
Glucose, UA: NEGATIVE
Ketones, UA: NEGATIVE
pH, UA: 5

## 2010-10-10 MED ORDER — CIPROFLOXACIN HCL 250 MG PO TABS: 250.0000 mg | ORAL_TABLET | Freq: Two times a day (BID) | ORAL | Status: AC

## 2010-10-10 NOTE — Patient Instructions (Addendum)
Please make a followup appointment in 3-4 months. Your urine test shows infection and so I will give the antibiotic for 5 days-ciprofloxacin 250 mg by mouth twice a day. Complete total 5 day course. I am Not sure if you have any infection in the vagina right now, and so this itching might be just due to the new cleaning material for your bathtub or from the [facility] foam tablet and douche using to clean yourself- please stop using that as it might give you more irritation or infection. Anyways the lab test for the infection would come back tomorrow and if it's positive for infection I will call and send antibiotic to the pharmacy. Please continue taking all other medications regularly.

## 2010-10-10 NOTE — Assessment & Plan Note (Addendum)
Per speculum exam shows whitish discharge in the vaginal wall but no rash or ulcers. No abnormality on external genital exam. She uses some nor foam tablet-which she inserts in her vaginal and also douche about one to 2 times every day. Explain her extensively, me, Purnell Shoemaker and Dr. Reche Dixon,  to avoid using those things as it doesn't help much and causes more irritation and increased risk of infection due to the change in the normal environment of vagina. If there is any infection it is likely a fungal infection, but will wait for further wet prep results to come back to give any treatment. I will give her a call and set antibiotic if needed after the wet prep comes back. Explained her about the plan and she is understanding. Wet Prep showed BV-- Sent prescription for Flagyl 500mg  po bid and called patient and informed her to pick it up. She sounded understanding.

## 2010-10-10 NOTE — Assessment & Plan Note (Signed)
Only complaint is of burning micturition without any increased frequency or straining or suprapubic pain. Afebrile and vital stable. Dipstick shows nitrites and leukocytes and blood. Collected urine sample for culture. I will empirically treat with ciprofloxacin 250 mg by mouth twice a day for 5 days.

## 2010-10-10 NOTE — Progress Notes (Signed)
  Subjective:    Patient ID: [name], female    DOB: 03/22/1938, 70 y.o.   MRN: [id number]  HPI [name] is a pleasant 71 year old woman with past medical history of hypertension, COPD who comes to the clinic for chief complaint of vaginal itching for one week. I saw her a week before for some paperwork and she did not complain of any itching at that time but she said that she had it at that time too. She complains of constant vaginal itching without pain. Also has noticed little white discharge coming out but denies any fever, chills, suprapubic pain, back pain. Also complaints of burning micturition, but no increased frequency. She says that her made got a new cleaning materials for her bathtub sometime before and after that she started itching all over her body and that got better but she started having vaginal itching after that. She has already stopped using the new cleaning material anyways. She also uses 'nor foam tablet' - and douche about once or twice a week to clean her vagina. Advised her not to do that. Denies any chest pain, shortness of breath, nausea, vomiting.    Review of Systems    as per history of present illness Objective:   Physical Exam    BP 144/75  Pulse 45  Temp(Src) 96.8 F (36 C) (Oral)  Wt 163 lb (73.936 kg)  General Appearance:    Alert, cooperative, no distress, appears stated age  Head:    Normocephalic, without obvious abnormality, atraumatic  Eyes:    PERRL, conjunctiva/corneas clear, EOM's intact, fundi    benign, both eyes  Ears:    Normal TM's and external ear canals, both ears  Nose:   Nares normal, septum midline, mucosa normal, no drainage    or sinus tenderness  Throat:   Lips, mucosa, and tongue normal; teeth and gums normal  Neck:   Supple, symmetrical, trachea midline,    Back:     Symmetric, no curvature, ROM normal, no CVA tenderness  Lungs:     Clear to auscultation bilaterally, respirations unlabored  Chest Wall:    No  tenderness or deformity   Heart:    Regular rate and rhythm, S1 and S2 normal, no murmur, rub   or gallop  Breast Exam:    No tenderness, masses, or nipple abnormality  Abdomen:     Soft, non-tender, bowel sounds active all four quadrants,    no masses, no organomegaly  Genitalia:    No vulvar lesions, per speculum exam showing whitish discharge in the vaginal wall, no redness or ulcer or significant tenderness. Sample collected for wet prep.   Rectal:    Normal tone, normal prostate, no masses or tenderness;   guaiac negative stool  Extremities:   Extremities normal, atraumatic, no cyanosis or edema  Pulses:   2+ and symmetric all extremities  Skin:   Skin color, texture, turgor normal, no rashes or lesions     Neurologic:   CNII-XII intact, normal strength, sensation and reflexes    throughout       Assessment & Plan:

## 2010-10-11 LAB — WET PREP BY MOLECULAR PROBE
Gardnerella vaginalis: POSITIVE — AB
Trichomonas vaginosis: NEGATIVE

## 2010-10-11 MED ORDER — METRONIDAZOLE 500 MG PO TABS: 500.0000 mg | ORAL_TABLET | Freq: Two times a day (BID) | ORAL | Status: AC

## 2010-10-11 NOTE — Progress Notes (Signed)
Addended by: Lyn Hollingshead C on: 10/11/2010 04:12 PM   Modules accepted: Orders

## 2010-10-13 LAB — URINE CULTURE

## 2010-11-09 ENCOUNTER — Emergency Department (HOSPITAL_COMMUNITY)
Admission: EM | Admit: 2010-11-09 | Discharge: 2010-11-09 | Disposition: A | Discharge status: Home / Self Care | Payer: Medicare Other | Attending: Emergency Medicine | Admitting: Emergency Medicine

## 2010-11-09 DIAGNOSIS — R42 Dizziness and giddiness: Secondary | ICD-10-CM | POA: Insufficient documentation

## 2010-11-09 DIAGNOSIS — J4489 Other specified chronic obstructive pulmonary disease: Secondary | ICD-10-CM | POA: Insufficient documentation

## 2010-11-09 DIAGNOSIS — J449 Chronic obstructive pulmonary disease, unspecified: Secondary | ICD-10-CM | POA: Insufficient documentation

## 2010-11-09 DIAGNOSIS — I10 Essential (primary) hypertension: Secondary | ICD-10-CM | POA: Insufficient documentation

## 2010-11-11 ENCOUNTER — Other Ambulatory Visit: Payer: Self-pay | Admitting: Internal Medicine

## 2011-03-08 ENCOUNTER — Emergency Department (HOSPITAL_COMMUNITY): Payer: Medicare Other

## 2011-03-08 ENCOUNTER — Emergency Department (HOSPITAL_COMMUNITY)
Admission: EM | Admit: 2011-03-08 | Discharge: 2011-03-08 | Disposition: A | Discharge status: Home / Self Care | Payer: Medicare Other | Attending: Emergency Medicine | Admitting: Emergency Medicine

## 2011-03-08 DIAGNOSIS — K5900 Constipation, unspecified: Secondary | ICD-10-CM | POA: Insufficient documentation

## 2011-03-08 DIAGNOSIS — R112 Nausea with vomiting, unspecified: Secondary | ICD-10-CM | POA: Insufficient documentation

## 2011-03-08 DIAGNOSIS — I10 Essential (primary) hypertension: Secondary | ICD-10-CM | POA: Insufficient documentation

## 2011-03-08 DIAGNOSIS — Z79899 Other long term (current) drug therapy: Secondary | ICD-10-CM | POA: Insufficient documentation

## 2011-03-08 DIAGNOSIS — R109 Unspecified abdominal pain: Secondary | ICD-10-CM | POA: Insufficient documentation

## 2011-04-20 ENCOUNTER — Emergency Department (HOSPITAL_COMMUNITY): Payer: Medicare Other

## 2011-04-20 ENCOUNTER — Emergency Department (HOSPITAL_COMMUNITY)
Admission: EM | Admit: 2011-04-20 | Discharge: 2011-04-20 | Disposition: A | Discharge status: Home / Self Care | Payer: Medicare Other | Attending: Emergency Medicine | Admitting: Emergency Medicine

## 2011-04-20 ENCOUNTER — Encounter (HOSPITAL_COMMUNITY): Payer: Self-pay | Admitting: *Deleted

## 2011-04-20 DIAGNOSIS — S62109A Fracture of unspecified carpal bone, unspecified wrist, initial encounter for closed fracture: Secondary | ICD-10-CM

## 2011-04-20 DIAGNOSIS — S52509A Unspecified fracture of the lower end of unspecified radius, initial encounter for closed fracture: Secondary | ICD-10-CM | POA: Insufficient documentation

## 2011-04-20 DIAGNOSIS — W19XXXA Unspecified fall, initial encounter: Secondary | ICD-10-CM

## 2011-04-20 DIAGNOSIS — M25439 Effusion, unspecified wrist: Secondary | ICD-10-CM | POA: Insufficient documentation

## 2011-04-20 DIAGNOSIS — M25539 Pain in unspecified wrist: Secondary | ICD-10-CM | POA: Insufficient documentation

## 2011-04-20 DIAGNOSIS — W010XXA Fall on same level from slipping, tripping and stumbling without subsequent striking against object, initial encounter: Secondary | ICD-10-CM | POA: Insufficient documentation

## 2011-04-20 MED ORDER — HYDROCODONE-ACETAMINOPHEN 5-500 MG PO TABS: 1.0000 | ORAL_TABLET | Freq: Four times a day (QID) | ORAL | Status: AC | PRN

## 2011-04-20 MED ORDER — HYDROCODONE-ACETAMINOPHEN 5-325 MG PO TABS
1.0000 | ORAL_TABLET | Freq: Once | ORAL | Status: AC
  Administered 2011-04-20: 1 via ORAL
  Filled 2011-04-20: qty 1

## 2011-04-20 NOTE — ED Notes (Signed)
Pt reports falling today and now having right wrist pain and swelling.

## 2011-04-20 NOTE — ED Notes (Signed)
Patient is resting comfortably. 

## 2011-04-20 NOTE — ED Provider Notes (Signed)
History     CSN: [id number] Arrival date & time: 04/20/2011 12:34 PM   First MD Initiated Contact with Patient 04/20/11 1306      Chief Complaint  Patient presents with  . Fall  . Wrist Pain    (Consider location/radiation/quality/duration/timing/severity/associated sxs/prior treatment) Patient is a 70 y.o. female presenting with fall and wrist pain. The history is provided by the patient and a relative.  Fall Pertinent negatives include no fever, no abdominal pain and no headaches.  Wrist Pain Pertinent negatives include no chest pain, no abdominal pain, no headaches and no shortness of breath.  pt fell at home. Says went to open door, and pulled on door, lost balance and fell onto outstretched right hand/wrist. C/o right wrist pain. Constant. Dull. Non radiating. Worse w movement and palpation. No numbness/weakness. Skin intact. No faintness or dizziness. No head injury or headache. No neck or back pain. Denies other injury.  Past Medical History  Diagnosis Date  . Hypertension   . HEARING LOSS   . Blind     s/p bilateral corneal transplant - sees well now    Past Surgical History  Procedure Date  . Eye surgery   . Shoulder open rotator cuff repair 1973    right shoulder, secondary numbness in R hand    Family History  Problem Relation Age of Onset  . Cancer Maternal Uncle     colon  . Cancer Maternal Grandmother     liver    History  Substance Use Topics  . Smoking status: Current Everyday Smoker -- 0.5 packs/day    Types: Cigarettes  . Smokeless tobacco: Not on file  . Alcohol Use: No    OB History    Grav Para Term Preterm Abortions TAB SAB Ect Mult Living                  Review of Systems  Constitutional: Negative for fever.  HENT: Negative for neck pain.   Eyes: Negative for redness.  Respiratory: Negative for shortness of breath.   Cardiovascular: Negative for chest pain.  Gastrointestinal: Negative for abdominal pain.  Genitourinary:  Negative for flank pain.  Musculoskeletal: Negative for back pain.  Skin: Negative for rash.  Neurological: Negative for headaches.  Hematological: Does not bruise/bleed easily.  Psychiatric/Behavioral: Negative for confusion.    Allergies  Codeine and Morphine  Home Medications   Current Outpatient Rx  Name Route Sig Dispense Refill  . DIPHENHYDRAMINE-ACETAMINOPHEN 25-500 MG PO TABS Oral Take 2 tablets by mouth every 6 (six) hours.      Marland Kitchen METOPROLOL TARTRATE 50 MG PO TABS Oral Take 50 mg by mouth 2 (two) times daily.      Marland Kitchen FLUTICASONE-SALMETEROL 100-50 MCG/DOSE IN AEPB Inhalation Inhale 1 puff into the lungs 2 (two) times daily.      Marland Kitchen HYDROCODONE-ACETAMINOPHEN 5-500 MG PO TABS Oral Take 1 tablet by mouth every 8 (eight) hours as needed. pain     . LISINOPRIL 10 MG PO TABS  take 1 tablet by mouth once daily 30 tablet 11    BP 146/65  Pulse 85  Temp(Src) 97.5 F (36.4 C) (Oral)  Resp 18  SpO2 100%  Physical Exam  Nursing note and vitals reviewed. Constitutional: She is oriented to person, place, and time. She appears well-developed and well-nourished. No distress.  HENT:  Head: Atraumatic.  Eyes: Conjunctivae are normal. Pupils are equal, round, and reactive to light. No scleral icterus.  Neck: Neck supple. No tracheal deviation present.  Spine non tender  Cardiovascular: Normal rate and intact distal pulses.   Pulmonary/Chest: Effort normal. No respiratory distress.  Abdominal: Soft. Normal appearance. There is no tenderness.  Musculoskeletal: She exhibits tenderness.       sts and tenderness right wrist. Skin intact. Radial pulse 2+. Hand nvi. No other focal bony tenderness. Limited rom at wrist due to sts/pain  Neurological: She is alert and oriented to person, place, and time.       Motor intact bil  Skin: Skin is warm and dry. No rash noted.  Psychiatric: She has a normal mood and affect.    ED Course  Procedures (including critical care time) Dg Wrist  Complete Right  04/20/2011  *RADIOLOGY REPORT*  Clinical Data: Fall, pain and swelling.  RIGHT WRIST - COMPLETE 3+ VIEW  Comparison: None.  Findings: The patient has a mildly impacted and dorsally angulated fracture of the distal radius.  No step off at the articular surface is identified.  Ulnar styloid fracture is identified. Degenerative change at the base of the thumb is noted.  Soft tissue swelling is present about the wrist.  IMPRESSION:  1.  Distal radius and ulnar styloid fractures as described. 2.  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 3. Per CMS PQRS reporting requirements (PQRS Measure 24): Given the patient's age of greater than 50 and the fracture site (hip, distal radius, or spine), the patient should be tested for osteoporosis using DXA, and the appropriate treatment considered based on the DXA results.  Original Report Authenticated By: Bernadene Bell. Maricela Curet, M.D.        MDM  Nilda Calamity. Xrays. vicodin 1 po.   Discussed w dr Dwana Curd - he reviewed films, states sugar tong, follow up office in next couple days.  Ortho tech to splint. Discussed plan w pt, and prob need for surgery.       Suzi Roots, MD 04/20/11 [phone]

## 2011-04-20 NOTE — ED Notes (Signed)
Family at bedside. 

## 2011-04-23 ENCOUNTER — Other Ambulatory Visit: Payer: Self-pay | Admitting: *Deleted

## 2011-04-23 MED ORDER — METOPROLOL TARTRATE 50 MG PO TABS: 50.0000 mg | ORAL_TABLET | Freq: Two times a day (BID) | ORAL | Status: DC

## 2011-04-30 ENCOUNTER — Encounter: Payer: Self-pay | Admitting: Internal Medicine

## 2011-04-30 ENCOUNTER — Ambulatory Visit (INDEPENDENT_AMBULATORY_CARE_PROVIDER_SITE_OTHER): Payer: Medicare Other | Admitting: Internal Medicine

## 2011-04-30 VITALS — BP 190/84 | HR 74 | Temp 95.9°F | Ht 64.0 in | Wt 155.5 lb

## 2011-04-30 DIAGNOSIS — J449 Chronic obstructive pulmonary disease, unspecified: Secondary | ICD-10-CM

## 2011-04-30 DIAGNOSIS — F17200 Nicotine dependence, unspecified, uncomplicated: Secondary | ICD-10-CM

## 2011-04-30 DIAGNOSIS — I10 Essential (primary) hypertension: Secondary | ICD-10-CM

## 2011-04-30 MED ORDER — TIOTROPIUM BROMIDE MONOHYDRATE 18 MCG IN CAPS: 18.0000 ug | ORAL_CAPSULE | Freq: Every day | RESPIRATORY_TRACT | Status: DC

## 2011-04-30 MED ORDER — ALBUTEROL 90 MCG/ACT IN AERS: 2.0000 | INHALATION_SPRAY | Freq: Four times a day (QID) | RESPIRATORY_TRACT | Status: DC | PRN

## 2011-04-30 MED ORDER — LISINOPRIL 10 MG PO TABS: 10.0000 mg | ORAL_TABLET | Freq: Every day | ORAL | Status: DC

## 2011-04-30 MED ORDER — FLUTICASONE-SALMETEROL 100-50 MCG/DOSE IN AEPB: 1.0000 | INHALATION_SPRAY | Freq: Two times a day (BID) | RESPIRATORY_TRACT | Status: DC

## 2011-04-30 MED ORDER — METOPROLOL TARTRATE 50 MG PO TABS: 50.0000 mg | ORAL_TABLET | Freq: Two times a day (BID) | ORAL | Status: DC

## 2011-04-30 NOTE — Progress Notes (Signed)
  Subjective:    Patient ID: [name], female    DOB: 02-Aug-1940, 70 y.o.   MRN: [id number]  HPI [name] is a pleasant 70 year old woman with past with history of hypertension, chronic kidney disease stage II, hyperlipidemia who comes the clinic for medication refills and followup visit after recent fall and right wrist fracture.  She had an for on 04/20/2011 and had a distal ulnar and radial fracture- followed by orthopedics after that, had surgery with internal fixation. She has a cast in place. Has an appointment with orthopedist tomorrow. She is prescribed Vicodin for pain by orthopedics.  She had her toenails removed on left side of the before her fall- at the podiatrist's office.  She was out of her blood pressure medications for about one month. Her blood pressure today is 190/84. She did not take her metoprolol this morning. She is out of lisinopril - which apparently should be in pharmacy because I refilled it in July 2012, but anyways I will refill today.  She is still smoking 4-5 cigarettes a day. Has tried few times in past to quit smoking, was successful once and didn't smoke for 6 years- then started back again- says that" nicotine pulled her back in".  When she was in hospital last time she said that nicotine patches helped her a lot, and so I did prescribe her nicotine patches during last visit in May 2012- but they are expensive and Medicare doesn't pay for them- that's what she says. She Is willing to quit smoking- and I discussed in detail about risks of worsening COPD and lung cancer and other cancers his smoking as she only has per history of cancer in multiple people.  She was accompanied by her son and he seemed to be involved in her care and health.   Anyways she denies any fever, chills, nausea vomiting, chest pain, short of breath and abdominal pain, diarrhea.   Review of Systems     as per history of present illness, all other systems reviewed and  negative.  Objective:   Physical Exam  General: NAD HEENT: PERRL, EOMI, no scleral icterus Cardiac: S1, S2, RRR, no rubs, murmurs or gallops Pulm: clear to auscultation bilaterally, moving normal volumes of air Abd: soft, nontender, nondistended, BS present Ext: warm and well perfused, no pedal edema Neuro: alert and oriented X3, cranial nerves II-XII grossly intact       Assessment & Plan:

## 2011-04-30 NOTE — Assessment & Plan Note (Signed)
As described in history of present illness, she was to quit smoking- nicotine patches has helped in past- but cannot afford it due to Medicare.  I gave her counseling for smoking cessation and made a referral with Dorothe Pea for further help with smoking cessation for this highly motivated patient.

## 2011-04-30 NOTE — Assessment & Plan Note (Addendum)
Lab Results  Component Value Date   NA 140 01/22/2010   K 4.5 01/22/2010   CL 104 01/22/2010   CO2 29 01/22/2010   BUN 8 01/22/2010   CREATININE 1.11 01/22/2010    BP Readings from Last 3 Encounters:  04/30/11 190/84  04/20/11 146/65  10/10/10 144/75    Assessment: Hypertension control:  severely elevated  Progress toward goals:  deteriorated Barriers to meeting goals:  nonadherence to medications  Plan: Hypertension treatment:  continue current medications Restart lisinopril. I will see her back in about 4-6 weeks for blood pressure recheck.

## 2011-04-30 NOTE — Assessment & Plan Note (Signed)
She was not using Advair or Spiriva. She did not have albuterol. Her son says that she gets intermittently short of breath and has some wheezes- on phone.  I discussed in detail the importance of using inhalers and stop smoking to prevent getting her COPD worse. She said that she is ready to use Advair but does not like Spiriva and his heart to use the capsule.  I would not force her using Spiriva today, but restarted Advair and will add albuterol as a rescue inhaler.

## 2011-04-30 NOTE — Patient Instructions (Signed)
Please make a followup appointment in 4-5 weeks with Dr. Allena Katz.  Get the prescription for lisinopril 10 mg daily for her blood pressure.  Also get a prescription for the new inhaler- albuterol- which she can use 2 puffs every 6 hours as needed for shortness of breath and wheezing.  Get the prescription filled for Advair and start using it.  Followup with the orthopedic Dr. and follow their instructions.

## 2011-05-02 ENCOUNTER — Telehealth: Payer: Self-pay | Admitting: Licensed Clinical Social Worker

## 2011-05-02 NOTE — Telephone Encounter (Signed)
Left another message that I would call her after the New Year.

## 2011-05-14 NOTE — Telephone Encounter (Signed)
Left message to call social work/Patient has appmt on 1/23 at 1:45 and could possibly see her that day at 12:15 PM.

## 2011-05-16 DIAGNOSIS — D2370 Other benign neoplasm of skin of unspecified lower limb, including hip: Secondary | ICD-10-CM | POA: Diagnosis not present

## 2011-05-16 DIAGNOSIS — T8189XA Other complications of procedures, not elsewhere classified, initial encounter: Secondary | ICD-10-CM | POA: Diagnosis not present

## 2011-05-21 ENCOUNTER — Telehealth: Payer: Self-pay | Admitting: *Deleted

## 2011-05-21 NOTE — Telephone Encounter (Signed)
Pt calls w/ multiple complaints of toes not healing from a procedure and "knots" in leg, she is offered several appts and states she will have to call some family and friends for transportation and is to call clinic back to confirm a pm appt.

## 2011-05-21 NOTE — Telephone Encounter (Signed)
Noted  

## 2011-05-22 ENCOUNTER — Encounter: Payer: Self-pay | Admitting: Internal Medicine

## 2011-05-22 ENCOUNTER — Ambulatory Visit (INDEPENDENT_AMBULATORY_CARE_PROVIDER_SITE_OTHER): Payer: Medicare Other | Admitting: Internal Medicine

## 2011-05-22 DIAGNOSIS — L0291 Cutaneous abscess, unspecified: Secondary | ICD-10-CM | POA: Diagnosis not present

## 2011-05-22 DIAGNOSIS — L03039 Cellulitis of unspecified toe: Secondary | ICD-10-CM | POA: Diagnosis not present

## 2011-05-22 DIAGNOSIS — S52599A Other fractures of lower end of unspecified radius, initial encounter for closed fracture: Secondary | ICD-10-CM | POA: Diagnosis not present

## 2011-05-22 DIAGNOSIS — L0390 Cellulitis, unspecified: Secondary | ICD-10-CM

## 2011-05-22 DIAGNOSIS — N182 Chronic kidney disease, stage 2 (mild): Secondary | ICD-10-CM

## 2011-05-22 DIAGNOSIS — I129 Hypertensive chronic kidney disease with stage 1 through stage 4 chronic kidney disease, or unspecified chronic kidney disease: Secondary | ICD-10-CM

## 2011-05-22 DIAGNOSIS — L02619 Cutaneous abscess of unspecified foot: Secondary | ICD-10-CM | POA: Diagnosis not present

## 2011-05-22 LAB — BASIC METABOLIC PANEL
Calcium: 9.5 mg/dL (ref 8.4–10.5)
Creat: 1.2 mg/dL — ABNORMAL HIGH (ref 0.50–1.10)
Glucose, Bld: 84 mg/dL (ref 70–99)
Sodium: 140 mEq/L (ref 135–145)

## 2011-05-22 MED ORDER — SULFAMETHOXAZOLE-TMP DS 800-160 MG PO TABS: 1.0000 | ORAL_TABLET | Freq: Two times a day (BID) | ORAL | Status: AC

## 2011-05-22 NOTE — Patient Instructions (Signed)
1. she can take Tylenol 325 mg 1-2 tablets every 6 hours if needed for pain 2. if the pain and swelling and drainage worse significantly if you are experiencing fevers and chills please call the clinic for further recommendation

## 2011-05-22 NOTE — Assessment & Plan Note (Signed)
S/p nail removal in December 2012. It looks like patient has cellulitis and some purulent discharge from the nail. I will prescribe Bactrim for 10 days since doxycycline is on back order. She has a followup open with the podiatrist tomorrow. I recommended Tylenol for pain relief. I informed the patient and her son who was present during this office visit to call the clinic if her symptoms worsen including increased pus, swelling, tenderness and or fevers or chills. She will followup in one to 2 weeks in the clinic.

## 2011-05-22 NOTE — Progress Notes (Signed)
  Subjective:   Patient ID: [name] female   DOB: Aug 19, 1940 71 y.o.   MRN: [id number]  HPI: [name] is a 71 y.o. female with past medical history significant as outlined below who presented to the clinic with toe pain and lump on her left leg. Patient got her toe nails removed in December of 2012 and noted that the big left toe started to drain recently yellowish pus and has been more painful. She noted that she was supposed to take an antibiotic cream but he never received the prescription. She further reported that since December she noted a lump on her shin.    Past Medical History  Diagnosis Date  . Hypertension   . HEARING LOSS   . Blind     s/p bilateral corneal transplant - sees well now   Current Outpatient Prescriptions  Medication Sig Dispense Refill  . albuterol (PROVENTIL,VENTOLIN) 90 MCG/ACT inhaler Inhale 2 puffs into the lungs every 6 (six) hours as needed for wheezing.  17 g  12  . Fluticasone-Salmeterol (ADVAIR DISKUS) 100-50 MCG/DOSE AEPB Inhale 1 puff into the lungs 2 (two) times daily.  60 each  11  . lisinopril (PRINIVIL,ZESTRIL) 10 MG tablet Take 1 tablet (10 mg total) by mouth daily.  30 tablet  11  . metoprolol (LOPRESSOR) 50 MG tablet Take 1 tablet (50 mg total) by mouth 2 (two) times daily.  62 tablet  11  . sulfamethoxazole-trimethoprim (BACTRIM DS) 800-160 MG per tablet Take 1 tablet by mouth 2 (two) times daily.  20 tablet  0  . tiotropium (SPIRIVA) 18 MCG inhalation capsule Place 1 capsule (18 mcg total) into inhaler and inhale daily.  30 capsule  11   Review of Systems: Constitutional: Denies fever, chills, diaphoresis, appetite change and fatigue.  Respiratory: Denies SOB, DOE, cough, chest tightness,  and wheezing.   Cardiovascular: Denies chest pain, palpitations and leg swelling.    Objective:  Physical Exam: Filed Vitals:   05/22/11 1426 05/22/11 1504  BP: 170/76 168/78  Pulse: 56   Temp: 97 F (36.1 C)   Height: 5\' 4"   (1.626 m)   Weight: 158 lb 3.2 oz (71.759 kg)    Constitutional: Vital signs reviewed.  Patient is a well-developed and well-nourished in no acute distress and cooperative with exam. Alert and oriented x3.  Neck: Supple,  Cardiovascular: RRR, S1 normal, S2 normal, no MRG, pulses symmetric and intact bilaterally Pulmonary/Chest: CTAB, no wheezes, rales, or rhonchi Abdominal: Soft. Non-tender, non-distended, bowel sounds are normal,  Neurological: A&O x3,  sensory intact to light touch   bilaterally.  Skin: left toe: erythema, yellow discharge from the nail bed noted. Warm to touch and tender. Does not extend to the forefoot.  Lump on the shin ; no erythema, hyperpigmented. mildly tender to palpation.  Marland Kitchen

## 2011-05-22 NOTE — Assessment & Plan Note (Signed)
Patient was noted to have cellulitis on her big toe with some drainage.

## 2011-05-22 NOTE — Assessment & Plan Note (Signed)
There is a note of chronic kidney disease. Since patient is started on Bactrim I will obtain Bmet today. If worsening of function consider to stop and change the antibiotics accordingly.

## 2011-05-22 NOTE — Telephone Encounter (Signed)
Left two messages and no callback from patient. Leaving quitline information and smoking cessation handout w/ nurse as patient has afternoon appmt today.   Checked w/ the quitline and they are not distributing free patches when the patient has Medicare even though Medicare does not pay for patches.  Free patches available for uninsureds only.  Suggest purchasing store brand patches at [facility] or [facility]/cutting back on cigarettes so she has more funds to purchase NRT.

## 2011-05-23 DIAGNOSIS — T8189XA Other complications of procedures, not elsewhere classified, initial encounter: Secondary | ICD-10-CM | POA: Diagnosis not present

## 2011-05-28 DIAGNOSIS — S91109A Unspecified open wound of unspecified toe(s) without damage to nail, initial encounter: Secondary | ICD-10-CM | POA: Diagnosis not present

## 2011-05-28 DIAGNOSIS — S62109A Fracture of unspecified carpal bone, unspecified wrist, initial encounter for closed fracture: Secondary | ICD-10-CM | POA: Diagnosis not present

## 2011-06-03 DIAGNOSIS — D2370 Other benign neoplasm of skin of unspecified lower limb, including hip: Secondary | ICD-10-CM | POA: Diagnosis not present

## 2011-06-03 DIAGNOSIS — M7750 Other enthesopathy of unspecified foot: Secondary | ICD-10-CM | POA: Diagnosis not present

## 2011-06-04 ENCOUNTER — Ambulatory Visit (INDEPENDENT_AMBULATORY_CARE_PROVIDER_SITE_OTHER): Payer: Medicare Other | Admitting: Internal Medicine

## 2011-06-04 ENCOUNTER — Encounter: Payer: Self-pay | Admitting: Internal Medicine

## 2011-06-04 VITALS — BP 140/79 | HR 53 | Temp 97.0°F | Ht 64.0 in | Wt 159.3 lb

## 2011-06-04 DIAGNOSIS — I10 Essential (primary) hypertension: Secondary | ICD-10-CM

## 2011-06-04 DIAGNOSIS — F17200 Nicotine dependence, unspecified, uncomplicated: Secondary | ICD-10-CM

## 2011-06-04 DIAGNOSIS — E785 Hyperlipidemia, unspecified: Secondary | ICD-10-CM

## 2011-06-04 DIAGNOSIS — L989 Disorder of the skin and subcutaneous tissue, unspecified: Secondary | ICD-10-CM

## 2011-06-04 MED ORDER — NICOTINE 21 MG/24HR TD PT24: 1.0000 | MEDICATED_PATCH | TRANSDERMAL | Status: AC

## 2011-06-04 MED ORDER — LISINOPRIL 10 MG PO TABS: 10.0000 mg | ORAL_TABLET | Freq: Every day | ORAL | Status: DC

## 2011-06-04 MED ORDER — AMLODIPINE BESYLATE 10 MG PO TABS: 10.0000 mg | ORAL_TABLET | Freq: Every day | ORAL | Status: DC

## 2011-06-04 NOTE — Patient Instructions (Signed)
Please make a followup appointment in 3-4 months.  Stop taking Metroprolol. Start taking Norvasc 10 mg daily. Continue taking lisinopril 10 mg daily.  If your insurance doesn't pay for nicotine patches, call the clinic. We will try some pills.  Take all the medications regularly.

## 2011-06-04 NOTE — Assessment & Plan Note (Signed)
Continue statin. Recheck lipid panel during next visit.

## 2011-06-04 NOTE — Progress Notes (Signed)
  Subjective:    Patient ID: [name], female    DOB: 1941-02-09, 71 y.o.   MRN: [id number]  HPI [name] is a pleasant 71 year woman with past with history of hypertension, hyperlipidemia, COPD who comes in the neck for followup visit for cellulitis.  She was seen by Dr. Loistine Chance on 05/22/2011 4 left toe infection- was sent home on Bactrim for 10 days and podiatry followup. Infection is resolved and patient is feeling better. She also complains of left leg nodule- along the shin- which was painful during last visit and is getting better and is less painful now.  The nodule is subcutaneous, moves with the skin and is not red. Is mildly tender with movement.  Patient denies any fever, chills, nausea, vomiting, abdominal pain, chest pain, short of breath, diarrhea.  She does complain of skin lesions on her left chest and axilla and also on the back and neck- which were present during last visit with me in December 2012. Those are warty lesions- dark brown in color-multiple morphological shapes.  She was accompanied by her son today.  Review of Systems    as per history of present illness, all other systems reviewed and negative. Objective:   Physical Exam General: NAD HEENT: PERRL, EOMI, no scleral icterus Cardiac: S1, S2, RRR, no rubs, murmurs or gallops Pulm: clear to auscultation bilaterally, moving normal volumes of air Abd: soft, nontender, nondistended, BS present Ext: warm and well perfused, no pedal edema. Left mid shin subcutaneous nodule- no redness or discharge. Mild tenderness to palpation. Skin: Multiple warty dark brown to black skin lesions- left chest, under left breast, back and neck. Neuro: alert and oriented X3, cranial nerves II-XII grossly intact        Assessment & Plan:

## 2011-06-04 NOTE — Assessment & Plan Note (Signed)
Will prescribe nicotine patches and see if her insurance pay for that. If not, she will give [place] a call and I will try her on bupropion or Chantix. I also gave information about 1-800-quit smoking , number.

## 2011-06-04 NOTE — Assessment & Plan Note (Signed)
Lab Results  Component Value Date   NA 140 05/22/2011   K 4.9 05/22/2011   CL 105 05/22/2011   CO2 29 05/22/2011   BUN 15 05/22/2011   CREATININE 1.20* 05/22/2011   CREATININE 1.11 01/22/2010    BP Readings from Last 3 Encounters:  06/04/11 140/79  05/22/11 168/78  04/30/11 190/84    Assessment: Hypertension control:  controlled  Progress toward goals:  at goal Barriers to meeting goals:  no barriers identified  Plan: Hypertension treatment:  Stop Toprol. Continue lisinopril and add Norvasc 10 mg daily. Heart rate was 53 today and previously had one reading with 45. So I will stop her beta blocker.

## 2011-06-04 NOTE — Assessment & Plan Note (Signed)
Multiple small to large dark brown to black lesions on left chest, under left breast, back, neck. Some warty sticky lesions. No significant pain or itching.  Seborrheic keratosis is in differential.  I will make a referral for dermatology in March 2013- as patient has some issues with Medicaid address.

## 2011-06-17 DIAGNOSIS — Q66229 Congenital metatarsus adductus, unspecified foot: Secondary | ICD-10-CM | POA: Diagnosis not present

## 2011-06-17 DIAGNOSIS — D2370 Other benign neoplasm of skin of unspecified lower limb, including hip: Secondary | ICD-10-CM | POA: Diagnosis not present

## 2011-06-19 DIAGNOSIS — S52599A Other fractures of lower end of unspecified radius, initial encounter for closed fracture: Secondary | ICD-10-CM | POA: Diagnosis not present

## 2011-06-19 DIAGNOSIS — G5600 Carpal tunnel syndrome, unspecified upper limb: Secondary | ICD-10-CM | POA: Diagnosis not present

## 2011-06-26 DIAGNOSIS — M79609 Pain in unspecified limb: Secondary | ICD-10-CM | POA: Diagnosis not present

## 2011-07-03 ENCOUNTER — Ambulatory Visit (HOSPITAL_COMMUNITY)
Admission: RE | Admit: 2011-07-03 | Discharge: 2011-07-03 | Disposition: A | Discharge status: Home / Self Care | Payer: Medicare Other | Source: Ambulatory Visit | Attending: Internal Medicine | Admitting: Internal Medicine

## 2011-07-03 DIAGNOSIS — Z01810 Encounter for preprocedural cardiovascular examination: Secondary | ICD-10-CM | POA: Diagnosis not present

## 2011-07-03 DIAGNOSIS — I70209 Unspecified atherosclerosis of native arteries of extremities, unspecified extremity: Secondary | ICD-10-CM | POA: Diagnosis not present

## 2011-07-03 DIAGNOSIS — Z419 Encounter for procedure for purposes other than remedying health state, unspecified: Secondary | ICD-10-CM

## 2011-07-03 DIAGNOSIS — I739 Peripheral vascular disease, unspecified: Secondary | ICD-10-CM

## 2011-07-03 DIAGNOSIS — M79609 Pain in unspecified limb: Secondary | ICD-10-CM | POA: Diagnosis not present

## 2011-07-03 NOTE — Progress Notes (Signed)
VASCULAR LAB PRELIMINARY  PRELIMINARY  PRELIMINARY  PRELIMINARY  Bilateral lower extremity arterial duplex and ABIs have been completed.    Preliminary report: Right  ABI indicates a severe loss of arterial flow.Left  ABI indicates a moderate decrease in arterial flow.   Rt. ABI: 0.44, Lt.  ABI 0.74.  Duplex imaging reveals diffuse plaque throughout the common and superficial femoral arteries.  Moderate to severe disease noted in the popliteal and tibial arteries.  [name], 07/03/2011, 7:39 PM

## 2011-07-07 ENCOUNTER — Ambulatory Visit: Payer: Self-pay | Admitting: Internal Medicine

## 2011-07-07 ENCOUNTER — Ambulatory Visit (HOSPITAL_COMMUNITY)
Admission: RE | Admit: 2011-07-07 | Discharge: 2011-07-07 | Disposition: A | Discharge status: Home / Self Care | Payer: Medicare Other | Source: Ambulatory Visit

## 2011-07-07 DIAGNOSIS — Z419 Encounter for procedure for purposes other than remedying health state, unspecified: Secondary | ICD-10-CM

## 2011-07-07 DIAGNOSIS — F17200 Nicotine dependence, unspecified, uncomplicated: Secondary | ICD-10-CM | POA: Diagnosis not present

## 2011-07-07 DIAGNOSIS — I824Y9 Acute embolism and thrombosis of unspecified deep veins of unspecified proximal lower extremity: Secondary | ICD-10-CM | POA: Diagnosis not present

## 2011-07-07 DIAGNOSIS — I10 Essential (primary) hypertension: Secondary | ICD-10-CM | POA: Diagnosis not present

## 2011-07-07 DIAGNOSIS — M79609 Pain in unspecified limb: Secondary | ICD-10-CM | POA: Diagnosis not present

## 2011-07-07 NOTE — Progress Notes (Signed)
VASCULAR LAB PRELIMINARY  PRELIMINARY  PRELIMINARY  PRELIMINARY  Bilateral lower extremity venous Dopplers completed.    Preliminary report:  There is an acute, small, non occlusive DVT noted behind a valve in the right common femoral vein.  All other veins appear thrombus free.  Sherren Kerns [place], 07/07/2011, 11:03 AM

## 2011-07-08 ENCOUNTER — Encounter (HOSPITAL_COMMUNITY): Payer: Self-pay | Admitting: General Practice

## 2011-07-08 ENCOUNTER — Telehealth: Payer: Self-pay | Admitting: *Deleted

## 2011-07-08 ENCOUNTER — Observation Stay (HOSPITAL_COMMUNITY)
Admission: AD | Admit: 2011-07-08 | Discharge: 2011-07-09 | Disposition: A | Discharge status: Home / Self Care | Payer: Medicare Other | Source: Ambulatory Visit | Attending: Internal Medicine | Admitting: Internal Medicine

## 2011-07-08 DIAGNOSIS — I749 Embolism and thrombosis of unspecified artery: Secondary | ICD-10-CM | POA: Diagnosis not present

## 2011-07-08 DIAGNOSIS — I824Y9 Acute embolism and thrombosis of unspecified deep veins of unspecified proximal lower extremity: Principal | ICD-10-CM | POA: Insufficient documentation

## 2011-07-08 DIAGNOSIS — J449 Chronic obstructive pulmonary disease, unspecified: Secondary | ICD-10-CM | POA: Diagnosis present

## 2011-07-08 DIAGNOSIS — F17200 Nicotine dependence, unspecified, uncomplicated: Secondary | ICD-10-CM | POA: Diagnosis present

## 2011-07-08 DIAGNOSIS — J4489 Other specified chronic obstructive pulmonary disease: Secondary | ICD-10-CM | POA: Diagnosis present

## 2011-07-08 DIAGNOSIS — H9190 Unspecified hearing loss, unspecified ear: Secondary | ICD-10-CM

## 2011-07-08 DIAGNOSIS — I82419 Acute embolism and thrombosis of unspecified femoral vein: Secondary | ICD-10-CM

## 2011-07-08 DIAGNOSIS — H548 Legal blindness, as defined in USA: Secondary | ICD-10-CM

## 2011-07-08 DIAGNOSIS — M79609 Pain in unspecified limb: Secondary | ICD-10-CM | POA: Diagnosis not present

## 2011-07-08 DIAGNOSIS — I10 Essential (primary) hypertension: Secondary | ICD-10-CM

## 2011-07-08 HISTORY — DX: Unspecified osteoarthritis, unspecified site: M19.90

## 2011-07-08 HISTORY — DX: Acute embolism and thrombosis of unspecified deep veins of unspecified lower extremity: I82.409

## 2011-07-08 HISTORY — DX: Phlebitis and thrombophlebitis of unspecified site: I80.9

## 2011-07-08 LAB — CBC
HCT: 42 % (ref 36.0–46.0)
Hemoglobin: 13 g/dL (ref 12.0–15.0)
MCH: 30.7 pg (ref 26.0–34.0)
RBC: 4.23 MIL/uL (ref 3.87–5.11)

## 2011-07-08 MED ORDER — WARFARIN VIDEO: Freq: Once | Status: DC

## 2011-07-08 MED ORDER — COUMADIN BOOK
Freq: Once | Status: AC
  Administered 2011-07-08: 17:00:00
  Filled 2011-07-08: qty 1

## 2011-07-08 MED ORDER — LISINOPRIL 10 MG PO TABS
10.0000 mg | ORAL_TABLET | Freq: Every day | ORAL | Status: DC
  Administered 2011-07-08 – 2011-07-09 (×2): 10 mg via ORAL
  Filled 2011-07-08 (×2): qty 1

## 2011-07-08 MED ORDER — NICOTINE 21 MG/24HR TD PT24
21.0000 mg | MEDICATED_PATCH | Freq: Every day | TRANSDERMAL | Status: DC
  Administered 2011-07-08 – 2011-07-09 (×2): 21 mg via TRANSDERMAL
  Filled 2011-07-08 (×2): qty 1

## 2011-07-08 MED ORDER — ENOXAPARIN SODIUM 80 MG/0.8ML ~~LOC~~ SOLN
75.0000 mg | Freq: Two times a day (BID) | SUBCUTANEOUS | Status: DC
  Administered 2011-07-08 – 2011-07-09 (×2): 75 mg via SUBCUTANEOUS
  Administered 2011-07-09: 80 mg via SUBCUTANEOUS
  Filled 2011-07-08 (×4): qty 0.8

## 2011-07-08 MED ORDER — AMLODIPINE BESYLATE 10 MG PO TABS
10.0000 mg | ORAL_TABLET | Freq: Every day | ORAL | Status: DC
  Administered 2011-07-08 – 2011-07-09 (×2): 10 mg via ORAL
  Filled 2011-07-08 (×2): qty 1

## 2011-07-08 MED ORDER — WARFARIN SODIUM 6 MG PO TABS
6.0000 mg | ORAL_TABLET | Freq: Once | ORAL | Status: AC
  Administered 2011-07-08: 6 mg via ORAL
  Filled 2011-07-08: qty 1

## 2011-07-08 MED ORDER — NICOTINE 14 MG/24HR TD PT24: 14.0000 mg | MEDICATED_PATCH | Freq: Every day | TRANSDERMAL | Status: DC

## 2011-07-08 NOTE — H&P (Signed)
[facility]mission Note Date: 07/08/2011  Patient name: [name] Medical record number: [id number] Date of birth: 08-Jul-1940 Age: 71 y.o. Gender: female PCP: Lyn Hollingshead, MD, MD  Medical Service: INT - B2 Attending physician:     Dr. Josem Kaufmann Chief Complaint:  Asymptomatic right femoral vein clot.  History of Present Illness: Patient is a 71 yo female who presents with an incidentally diagnosed right femoral vein clot.  Patient went to her podiatrist for evaluation of bone spur on her left foot and was found to have abnormal pulses in her distal extremities.  She was sent into the hospital for an arterial ultrasound followed by a venous ultrasound.  The ultrasound showed a small nonocclusive thrombus in her right common femoral vein.  Patient denies any recent lower extremity pain or swelling.  Patient reports that she has been otherwise healthy prior to her clot.  She states that she has a one time history of clots back in the 70's and was on anticoagulation therapy for several years.   Meds: Medications Prior to Admission  Medication Dose Route Frequency Provider Last Rate Last Dose  . amLODipine (NORVASC) tablet 10 mg  10 mg Oral Daily Genella Mech, MD      . lisinopril (PRINIVIL,ZESTRIL) tablet 10 mg  10 mg Oral Daily Genella Mech, MD      . nicotine (NICODERM CQ - dosed in mg/24 hours) patch 21 mg  21 mg Transdermal Daily Genella Mech, MD      . DISCONTD: nicotine (NICODERM CQ - dosed in mg/24 hours) patch 14 mg  14 mg Transdermal Daily Genella Mech, MD       Medications Prior to Admission  Medication Sig Dispense Refill  . amLODipine (NORVASC) 10 MG tablet Take 1 tablet (10 mg total) by mouth daily.  30 tablet  11  . lisinopril (PRINIVIL,ZESTRIL) 10 MG tablet Take 1 tablet (10 mg total) by mouth daily.  30 tablet  11    Allergies: Codeine and Morphine Past Medical History  Diagnosis Date  . Hypertension   . HEARING LOSS   . Blind     s/p  bilateral corneal transplant - sees well now  . Arthritis   . DVT (deep venous thrombosis)   . Phlebitis    Past Surgical History  Procedure Date  . Eye surgery   . Shoulder open rotator cuff repair 1973    right shoulder, secondary numbness in R hand  . Foot surgery   . Right wrist    Family History  Problem Relation Age of Onset  . Cancer Maternal Uncle     colon  . Cancer Maternal Grandmother     liver  . Cancer Mother     liver  . Cancer Brother     Unknown  . Cancer Sister     Unknown   History   Social History  . Marital Status: Divorced    Spouse Name: N/A    Number of Children: N/A  . Years of Education: N/A   Occupational History  . Not on file.   Social History Main Topics  . Smoking status: Current Everyday Smoker -- 0.5 packs/day for 20 years    Types: Cigarettes  . Smokeless tobacco: Never Used  . Alcohol Use: No  . Drug Use: No  . Sexually Active: No   Other Topics Concern  . Not on file   Social History Narrative   Patient currently lives at home alone, but does have a home worker  who visits her throughout the week to help with things around the house.  Patient states that she still tries to remain active and walks daily.  She is has been divorced for sometime and has one son.  She currently lives off social security.  Patient reports using approximately half a pack of cigarettes daily for 20 years.  She denies any alcohol or illicit drug use.  Patient is legally blind and deaf, but has maintained functional capacity with the use of hearing aids.      Review of Systems: A comprehensive review of systems was negative.  Physical Exam: There were no vitals taken for this visit. General appearance: alert, cooperative, appears older than stated age and no distress Lungs: diminished breath sounds bilaterally Heart: regular rate and rhythm Abdomen: soft, non-tender; bowel sounds normal; no masses,  no organomegaly Extremities: no edema, redness or  tenderness in the calves or thighs Neurologic: Grossly normal  CBC: pending at time of admission  BMET: Pending for morning after admission  Imaging:  VASCULAR LAB  PRELIMINARY PRELIMINARY PRELIMINARY PRELIMINARY  Bilateral lower extremity venous Dopplers completed.  Preliminary report: There is an acute, small, non occlusive DVT noted behind a valve in the right common femoral vein. All other veins appear thrombus free.  Sherren Kerns [place],  07/07/2011, 11:03 AM   Assessment & Plan by Problem: Principal Problem:  *Femoral DVT (deep venous thrombosis) Active Problems:  TOBACCO ABUSE  HYPERTENSION  COPD  Femoral DVT:  Patient has an asymptomatic femoral DVT.  This was found incidentally by outside physician who felt that patient would benefit from inpatient observation and initiation of 3 months of anticoagulation therapy.  Given the patient's prior history of VTE treated with anticoagulation, we feel that lifelong anticoagulation is warranted at this time.  We will begin the patient on 75 mg of subcutaneous lovenox with 2.5 mg of Coumadin by mouth.  Patient will be monitored overnight with anticipated discharge tomorrow for outpatient follow-up of INR values and subsequent Coumadin dosage adjustment.     This is a Psychologist, occupational Note.  The care of the patient was discussed with Dr. Dorise Hiss and the assessment and plan was formulated with their assistance.  Please see their note for official documentation of the patient encounter.   Signed: [name] 07/08/2011, 3:38 PM   Internal Medicine Teaching Service Resident Admission Note Date: 07/08/2011  Patient name: [name] Medical record number: [id number] Date of birth: 27-Jul-1940 Age: 71 y.o. Gender: female PCP: Lyn Hollingshead, MD, MD  Medical Service:  I have reviewed the note by [name] MS 3 and was present during the interview and physical exam.  Please see below for findings, assessment, and plan.  Chief  Complaint: DVT found on [place]  History of Present Illness: Patient is a 71 yo female who presents with an incidentally diagnosed right femoral vein clot.  Patient went to her podiatrist for evaluation of bone spur on her left foot and was found to have abnormal pulses in her distal extremities.  She was sent into the hospital for an arterial ultrasound followed by a venous ultrasound.  The ultrasound showed a small nonocclusive thrombus in her right common femoral vein.  Patient denies any recent lower extremity pain or swelling.  Patient reports that she has been otherwise healthy prior to her clot.  Her only medical condition is hypertension and she is taking two medications for that. She states that she has a one time history of clots back in the  70's and was on anticoagulation therapy for until 1983-1984 (she can't recollect the exact date). No family history of clots and no recent weight loss. No trauma to the area that she remembers. No F/C. No change in bowel habits or blood in her stool. No chest pain, abdominal pain, no leg swelling. No SOB.   Meds: Medications Prior to Admission  Medication Dose Route Frequency Provider Last Rate Last Dose  . amLODipine (NORVASC) tablet 10 mg  10 mg Oral Daily Genella Mech, MD      . coumadin book   Does not apply Once Rocco Serene, MD      . enoxaparin (LOVENOX) injection 75 mg  75 mg Subcutaneous Q12H Rocco Serene, MD      . lisinopril (PRINIVIL,ZESTRIL) tablet 10 mg  10 mg Oral Daily Genella Mech, MD      . nicotine (NICODERM CQ - dosed in mg/24 hours) patch 21 mg  21 mg Transdermal Daily Genella Mech, MD      . warfarin (COUMADIN) tablet 6 mg  6 mg Oral ONCE-1800 Rocco Serene, MD      . warfarin (COUMADIN) video   Does not apply Once Rocco Serene, MD      . DISCONTD: nicotine (NICODERM CQ - dosed in mg/24 hours) patch 14 mg  14 mg Transdermal Daily Genella Mech, MD       Medications Prior to Admission  Medication Sig  Dispense Refill  . amLODipine (NORVASC) 10 MG tablet Take 1 tablet (10 mg total) by mouth daily.  30 tablet  11  . lisinopril (PRINIVIL,ZESTRIL) 10 MG tablet Take 1 tablet (10 mg total) by mouth daily.  30 tablet  11    Allergies: Codeine and Morphine  Past Medical History: Medical Student note reviewed  Family History: Medical Student note reviewed  Social History: Psychologist, occupational note reviewed  Surgical History: Medical Student note reviewed  Review of System: Medical Student note reviewed  Physical Exam: Blood pressure 149/67, pulse 66, temperature 98.1 F (36.7 C), resp. rate 16, SpO2 100.00%. General: resting in bed, sitting up in bed, pleasant HEENT: Pt is blind but can follow objects and sees at least shadows, EOMI, no scleral icterus Cardiac: RRR, no rubs, murmurs or gallops Pulm: clear to auscultation bilaterally, moving normal volumes of air Abd: soft, nontender, nondistended, BS present Ext: warm and well perfused, no pedal edema, no calf tenderness Neuro: alert and oriented X3, cranial nerves II-XII grossly intact   Labs: Reviewed as noted in the Electronic Record  Imaging: Reviewed as noted in the Electronic Record  Assessment & Plan by Problem: Femoral DVT (deep venous thrombosis) - Pt will be started on lovenox per pharmacy and coumadin per pharmacy. Case manager is consulted for home lovenox acquisition and someone to administer as she does not see well enough to do it herself. She is otherwise stable with no SOB. We will watch overnight.   TOBACCO ABUSE - Will give nicotine patch in the hospital. She is interested in quitting if she can acquire nicotine patches at home from her insurance as she is on fixed income.    HYPERTENSION - Stable, BP is 149/67 on admission. Will watch closely and keep her on her home medications of lisinopril and amlodipine. Can adjust lisinopril dosing upwards if needed in the future. She is currently on maximal amlodipine  therapy.   Disposition - Pt is placed on observation status and may likely be discharged home tomorrow.   DVT ppx- VTE  recently diagnosed and on lovenox and just starting coumadin.    SignedGenella Mech 07/08/2011, 4:51 PM

## 2011-07-08 NOTE — Progress Notes (Signed)
ANTICOAGULATION CONSULT NOTE - Initial Consult  Pharmacy Consult for Lovenox, Coumadin Indication: DVT  Allergies  Allergen Reactions  . Codeine     REACTION: Pt c/o "feeling high"  . Morphine     REACTION: feels funny and "high"    Patient Measurements:   Dosing Weight: 158 # per pt  Vital Signs:    Labs: Baseline SrCr 1.2 on 05/22/11 Medical History: Past Medical History  Diagnosis Date  . Hypertension   . HEARING LOSS   . 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 Arthritis   . DVT (deep venous thrombosis)   . Phlebitis     Medications:  Prescriptions prior to admission  Medication Sig Dispense Refill  . amLODipine (NORVASC) 10 MG tablet Take 1 tablet (10 mg total) by mouth daily.  30 tablet  11  . lisinopril (PRINIVIL,ZESTRIL) 10 MG tablet Take 1 tablet (10 mg total) by mouth daily.  30 tablet  11    Assessment: 71 yo lady admitted for treatment DVT Goal of Therapy:  INR 2-3, therapeutic anti-Xa levels   Plan:  Lovenox 75 mg sq q12 hours.   Coumadin 6 mg today. F/u daily PT/INR. CBC every 3 days while on lovenox.  Talbert Cage [place] 07/08/2011,3:34 PM

## 2011-07-08 NOTE — Progress Notes (Signed)
Noted order for possible assist with Lovenox, please be aware that pt has Medicare and Medicaid, therefore not eligible for assistance with medications. Of more concern is that pt is blind and would need assistance with Lovenox injection. [facility] would not be covered to come to pt for daily injections, will need to assure that pt has inhome assistance with these injections prior to d/c , from family or friends. Johny Shock RN MPH Case Manager [phone]

## 2011-07-08 NOTE — Plan of Care (Signed)
Problem: Consults Goal: Diagnosis - Venous Thromboembolism (VTE) Choose a selection Outcome: Completed/Met Date Met:  07/08/11 DVT (Deep Vein Thrombosis)

## 2011-07-08 NOTE — Telephone Encounter (Signed)
Pt called and asked to come in for admission. She wants to wait until tomorrow because of transportation.  Dr Meredith Pel talked with pt and recommended admission today. Pt agreed and [facility] was called to bring pt to 5000 Charge nurse on 5000 informed of admission and will page Dr Tonny Branch on arrival.

## 2011-07-08 NOTE — Telephone Encounter (Signed)
Pt called for an appointment to f/u on results she received from Vascular Lab. She had an appointment yesterday but was not able to make it. I scheduled her an appointment with PCP next Wed 3/6 but noted results from vascular lab and thinks she needs to get here sooner. She does not have a ride and she is blind. How soon does she need to be seen?

## 2011-07-08 NOTE — Plan of Care (Signed)
Problem: Phase I Progression Outcomes Goal: Initial discharge plan identified Outcome: Completed/Met Date Met:  07/08/11 Patient lives alone and states she needs to discharge home by Thursday 07/10/11

## 2011-07-08 NOTE — Telephone Encounter (Signed)
Patient had lower extremity venous duplex study yesterday, apparently ordered by the foot center, that showed a small nonocclusive thrombus behind a valve in the right common femoral vein.  I spoke by phone with Dr. Gretta Began, who read the study, today; he felt that the thrombosis, although small, was compromising flow and that 3 months of anticoagulation would be warranted.  The situation is made more difficult by the patient's visual deficit.  I discussed with inpatient attending Dr. Josem Kaufmann, and the plan is to admit patient directly to the internal medicine service today for further management.

## 2011-07-09 LAB — PROTIME-INR: INR: 1.05 (ref 0.00–1.49)

## 2011-07-09 LAB — CBC
HCT: 41.6 % (ref 36.0–46.0)
Hemoglobin: 13.6 g/dL (ref 12.0–15.0)
WBC: 5.8 10*3/uL (ref 4.0–10.5)

## 2011-07-09 LAB — BASIC METABOLIC PANEL
BUN: 14 mg/dL (ref 6–23)
CO2: 27 mEq/L (ref 19–32)
Calcium: 10.1 mg/dL (ref 8.4–10.5)
Glucose, Bld: 92 mg/dL (ref 70–99)
Sodium: 138 mEq/L (ref 135–145)

## 2011-07-09 MED ORDER — WARFARIN SODIUM 6 MG PO TABS: 6.0000 mg | ORAL_TABLET | Freq: Every day | ORAL | Status: DC

## 2011-07-09 MED ORDER — ENOXAPARIN (LOVENOX) PATIENT EDUCATION KIT
PACK | Freq: Once | Status: DC
  Filled 2011-07-09: qty 1

## 2011-07-09 MED ORDER — WARFARIN SODIUM 6 MG PO TABS
6.0000 mg | ORAL_TABLET | Freq: Once | ORAL | Status: DC
  Filled 2011-07-09: qty 1

## 2011-07-09 MED ORDER — ENOXAPARIN SODIUM 80 MG/0.8ML ~~LOC~~ SOLN: 80.0000 mg | Freq: Two times a day (BID) | SUBCUTANEOUS | Status: DC

## 2011-07-09 MED ORDER — WARFARIN - PHARMACIST DOSING INPATIENT
Freq: Every day | Status: DC
  Filled 2011-07-09: qty 1

## 2011-07-09 NOTE — Progress Notes (Signed)
Subjective: The patient is feeling well this morning. She is lying in her bed sleepy when I enter but is easily arousable to her name. Extensively discussed her treatment options and she would like to do her injections stating that she knows some mostly blind diabetics that do their own injections. She is willing to learn and will call for a ride home today. 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 Cardiac: RRR, no rubs, murmurs or gallops Pulm: clear to auscultation bilaterally, moving normal volumes of air Abd: soft, nontender, nondistended, BS present Ext: warm and well perfused, no pedal edema Neuro: alert and oriented X3, cranial nerves II-XII grossly intact  Lab Results: Basic Metabolic Panel:  Lab 07/09/11 0454  NA 138  K 3.7  CL 103  CO2 27  GLUCOSE 92  BUN 14  CREATININE 0.95  CALCIUM 10.1  MG --  PHOS --   CBC:  Lab 07/09/11 0610 07/08/11 1623  WBC 5.8 5.8  NEUTROABS -- --  HGB 13.6 13.0  HCT 41.6 42.0  MCV 97.4 99.3  PLT 193 181   Coagulation:  Lab 07/09/11 0610  LABPROT 13.9  INR 1.05   Medications: I have reviewed the patient's current medications. Scheduled Meds:   . amLODipine  10 mg Oral Daily  . coumadin book   Does not apply Once  . enoxaparin (LOVENOX) injection  75 mg Subcutaneous Q12H  . lisinopril  10 mg Oral Daily  . nicotine  21 mg Transdermal Daily  . warfarin  6 mg Oral ONCE-1800  .  warfarin   Does not apply Once  . DISCONTD: nicotine  14 mg Transdermal Daily   Continuous Infusions:  PRN Meds:. Assessment/Plan:   Femoral DVT (deep venous thrombosis) - Pt is started on lovenox and coumadin and will be continued until her INR is therapeutic. Will schedule follow up apt with Dr. Alexandria Lodge in clinic for Monday and she will likely need likelong anti-coagulation as this is her second blood clot.    TOBACCO ABUSE - Nicotine Rx at the pharmacy. Have counseled to quit and she seems willing.    HYPERTENSION - BP stable today and will discharge on home medications.  Disposition - Will be discharged home if she can learn to do injections herself. The nursing staff will teach and I did discuss this with them and they will call me if there are any problems.    LOS: 1 day   Genella Mech 07/09/2011, 9:59 AM

## 2011-07-09 NOTE — Progress Notes (Addendum)
ANTICOAGULATION CONSULT NOTE - Initial Consult  Pharmacy Consult for Lovenox, Coumadin Indication: DVT  Allergies  Allergen Reactions  . Codeine     REACTION: Pt c/o "feeling high"  . Morphine     REACTION: feels funny and "high"    Patient Measurements: Height: 5\' 4"  (162.6 cm) Weight: 160 lb 3.2 oz (72.666 kg) IBW/kg (Calculated) : 54.7  Dosing Weight: 158 # per pt  Vital Signs: Temp: 97.7 F (36.5 C) (02/27 0612) BP: 129/70 mmHg (02/27 0612) Pulse Rate: 63  (02/27 0612)  Labs:  INR today 1.05 Baseline SrCr 1.2 on 05/22/11 Medical History: Past Medical History  Diagnosis Date  . Hypertension   . HEARING LOSS   . 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 Arthritis   . DVT (deep venous thrombosis)   . Phlebitis     Medications:  Prescriptions prior to admission  Medication Sig Dispense Refill  . amLODipine (NORVASC) 10 MG tablet Take 1 tablet (10 mg total) by mouth daily.  30 tablet  11  . lisinopril (PRINIVIL,ZESTRIL) 10 MG tablet Take 1 tablet (10 mg total) by mouth daily.  30 tablet  11    Assessment: 71 yo lady admitted for treatment DVT Goal of Therapy:  INR 2-3, therapeutic anti-Xa levels   Plan:  Continue lovenox 75 mg sq q12 hours.  Could change to 110 mg sq q24 hours if that would facilitate pt discharge. Coumadin 6 mg again today.  Would recommend 5 mg daily as outpt. F/u daily PT/INR. CBC every 3 days while on lovenox.  [name] 07/09/2011,10:47 AM

## 2011-07-09 NOTE — Progress Notes (Addendum)
Continuing to follow for d/c needs, will ask HH to followup with pt if ordered, noted concern for power bill to be paid, given that the pt has a son, perhaps he could pay the bill if the pt is not ready to d/c. Staying in the hospital to receive the injection is not ideal if the pt feels that she can do this.  HHRN will reinforce teaching and assist as needed, just can not come BID to administer medications. Will continue to follow.  If HH needed please order and complete face to face form.  Johny Shock RN MPH Case Manager [phone] Noted concern that pt has not obtained nicotine patches. Please be aware that these patches are "over the counter' products, so generally not covered by insurance.  Johny Shock RN MPH Case Manager [phone]

## 2011-07-09 NOTE — Discharge Instructions (Signed)
You were seen in the hospital for a new blood clot in your leg. We will have you inject the blood thinner twice a day starting tomorrow until you see Dr. Alexandria Lodge at the out-patient clinic on the ground floor at [facility]. You also have an appoinment with your doctor Dr. Allena Katz on Wednesday next week at 1:15 PM. You also need to take a blood thinner by mouth. If you are coughing up blood or having bleeding please call our clinic at [phone]. If you have any questions or problems you can also call our clinic anytime.

## 2011-07-09 NOTE — Discharge Summary (Signed)
Internal Medicine Teaching [facility] Discharge Note  Name: [name] MRN: [id number] DOB: Nov 21, 1940 71 y.o.  Date of Admission: 07/08/2011 12:52 PM Date of Discharge: 07/09/2011 Attending Physician: Rocco Serene, MD  Discharge Diagnosis: Principal Problem:  *Femoral DVT (deep venous thrombosis) Active Problems:  TOBACCO ABUSE  HYPERTENSION    Discharge Medications: Medication List  As of 07/09/2011 11:52 AM   TAKE these medications         amLODipine 10 MG tablet   Commonly known as: NORVASC   Take 1 tablet (10 mg total) by mouth daily.      enoxaparin 80 MG/0.8ML Soln injection   Commonly known as: LOVENOX   Inject 0.8 mLs (80 mg total) into the skin every 12 (twelve) hours.      lisinopril 10 MG tablet   Commonly known as: PRINIVIL,ZESTRIL   Take 1 tablet (10 mg total) by mouth daily.      warfarin 6 MG tablet   Commonly known as: COUMADIN   Take 1 tablet (6 mg total) by mouth daily.            Disposition and follow-up:   [name] was discharged from [facility] in Good condition.    Follow-up Appointments: Follow-up Information    Follow up with Gar Ponto, PHARMD on 07/14/2011. (Appointment at 11:00 AM to check your blood at the clinic.)    Contact information:   [address] [place] [address] [phone]       Follow up with Lyn Hollingshead, MD on 07/16/2011. (At 1:15 PM)    Contact information:   [address] [place] [address] [phone]         Discharge Orders    Future Appointments: Provider: Department: Dept Phone: Center:   07/14/2011 11:00 AM [facility] [phone] [facility]   07/16/2011 1:15 PM Lyn Hollingshead, MD [facility] [phone] [facility]     Future Orders Please Complete By Expires   Diet - low sodium heart healthy      Increase activity slowly      Call MD for:      Comments:   Any signs of bleeding or coughing up blood. Also  call for shortness of breath that is new.      Consultations:   none  Procedures:  VASCULAR LAB  PRELIMINARY PRELIMINARY PRELIMINARY PRELIMINARY  Bilateral lower extremity venous Dopplers completed.  Preliminary report: There is an acute, small, non occlusive DVT noted behind a valve in the right common femoral vein. All other veins appear thrombus free.  Sherren Kerns [place],  07/07/2011, 11:03 AM  Admission HPI:  Patient is a 71 yo female who presents with an incidentally diagnosed right femoral vein clot. Patient went to her podiatrist for evaluation of bone spur on her left foot and was found to have abnormal pulses in her distal extremities. She was sent into the hospital for an arterial ultrasound followed by a venous ultrasound. The ultrasound showed a small nonocclusive thrombus in her right common femoral vein. Patient denies any recent lower extremity pain or swelling. Patient reports that she has been otherwise healthy prior to her clot. She states that she has a one time history of clots back in the 70's and was on anticoagulation therapy for several years.   Hospital Course by problem list:  Femoral DVT (deep venous thrombosis) - This DVT was incidentally found while working up blood flow  issues with her lower extremities. 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 This would indicate lifelong therapy with coumadin for her. She was started on lovenox and coumadin in the hospital and was taught how to inject herself. 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 We were able to get syringes that are the appropriate dose for her and she will just inject the whole syringe twice daily. She thinks she will be able to do this and was taught before she left. A home health RN will check on her and make sure she is doing this appropriately. She will see Dr. Alexandria Lodge in clinic on Monday for coumadin  adjustment and if needed more lovenox if her INR is not therapeutic.    TOBACCO ABUSE - She does use several cigarettes per day and wishes to quit. There is Rx at her pharmacy for nicotine patch and she will try to quit.    HYPERTENSION - Controlled on home amlodipine and lisinopril. There is room to increase her lisinopril if needed in the future. BP on discharge was 129/70. No changes to regiment  Discharge Vitals:  BP 129/70  Pulse 63  Temp 97.7 F (36.5 C)  Resp 20  Ht 5\' 4"  (1.626 m)  Wt 160 lb 3.2 oz (72.666 kg)  BMI 27.50 kg/m2  SpO2 96%  Discharge Labs:  Results for orders placed during the hospital encounter of 07/08/11 (from the past 24 hour(s))  CBC     Status: Normal   Collection Time   07/08/11  4:23 PM      Component Value Range   WBC 5.8  4.0 - 10.5 (K/uL)   RBC 4.23  3.87 - 5.11 (MIL/uL)   Hemoglobin 13.0  12.0 - 15.0 (g/dL)   HCT 78.2  95.6 - 21.3 (%)   MCV 99.3  78.0 - 100.0 (fL)   MCH 30.7  26.0 - 34.0 (pg)   MCHC 31.0  30.0 - 36.0 (g/dL)   RDW 08.6  57.8 - 46.9 (%)   Platelets 181  150 - 400 (K/uL)  BASIC METABOLIC PANEL     Status: Abnormal   Collection Time   07/09/11  6:10 AM      Component Value Range   Sodium 138  135 - 145 (mEq/L)   Potassium 3.7  3.5 - 5.1 (mEq/L)   Chloride 103  96 - 112 (mEq/L)   CO2 27  19 - 32 (mEq/L)   Glucose, Bld 92  70 - 99 (mg/dL)   BUN 14  6 - 23 (mg/dL)   Creatinine, Ser 6.29  0.50 - 1.10 (mg/dL)   Calcium 52.8  8.4 - 10.5 (mg/dL)   GFR calc non Af Amer 59 (*) >90 (mL/min)   GFR calc Af Amer 68 (*) >90 (mL/min)  PROTIME-INR     Status: Normal   Collection Time   07/09/11  6:10 AM      Component Value Range   Prothrombin Time 13.9  11.6 - 15.2 (seconds)   INR 1.05  0.00 - 1.49   CBC     Status: Normal   Collection Time   07/09/11  6:10 AM      Component Value Range   WBC 5.8  4.0 - 10.5 (K/uL)   RBC 4.27  3.87 - 5.11 (MIL/uL)   Hemoglobin 13.6  12.0 - 15.0 (g/dL)   HCT 41.6  36.0 - 46.0 (%)   MCV 97.4  78.0 -  100.0 (fL)   MCH 31.9  26.0 - 34.0 (pg)   MCHC 32.7  30.0 - 36.0 (g/dL)   RDW 96.0  45.4 - 09.8 (%)   Platelets 193  150 - 400 (K/uL)    Signed: Genella Mech 07/09/2011, 11:52 AM

## 2011-07-09 NOTE — Progress Notes (Addendum)
Met with pt who has HH aide, personal care services in the home 2hr and 45 mins per day. Have asked MD to order [facility] to follow this pt for management of Lovenox and to reinforce teaching as pt will be self injecting Lovenox in the home. Pt setup with [facility] for HHRN.  Johny Shock RN MPH Case Manager [phone]       CARE MANAGEMENT NOTE 07/09/2011  Patient:  [name], [name]   Account Number:  [id number]  Date Initiated:  07/09/2011  Documentation initiated by:  Harleigh Civello  Subjective/Objective Assessment:   met with pt re [facility] for medication management and assistance with lovenox injection.     Action/Plan:   Met with pt who elected [facility] for HH, pt has hh aide in place for 2hr and 45 mins of care per day.   Anticipated DC Date:  07/09/2011   Anticipated DC Plan:  HOME W HOME HEALTH SERVICES         [facility] Choice  HOME HEALTH   Choice offered to / List presented to:  C-1 Patient        HH arranged  HH-1 RN      [facility] agency  [facility]   Status of service:  Completed, signed off Medicare Important Message given?   (If response is "NO", the following Medicare IM given date fields will be blank) Date Medicare IM given:   Date Additional Medicare IM given:    Discharge Disposition:  HOME W HOME HEALTH SERVICES  Per UR Regulation:    Comments:  pt has hh aide  daily for 2hr and 45 mins.

## 2011-07-09 NOTE — Progress Notes (Signed)
Medical Student Daily Progress Note  Subjective: No acute events overnight.  Patient slept well with no complaints and tolerated a regular diet.  She still has no problems with pain or swelling in her lower extremities and denies and SOB.  Patient stated once again that she is anxious to get home by Thursday due to the need to pay a light bill.    Objective: Vital signs in last 24 hours: Filed Vitals:   07/08/11 1617 07/08/11 2000 07/08/11 2134 07/09/11 0612  BP: 149/67 132/64 127/67 129/70  Pulse: 66 66 67 63  Temp: 98.1 F (36.7 C) 98.1 F (36.7 C) 98 F (36.7 C) 97.7 F (36.5 C)  TempSrc:  Oral    Resp: 16 20 20 20   Height:  5\' 4"  (1.626 m)    Weight:  72.666 kg (160 lb 3.2 oz)    SpO2: 100%  96% 96%   Weight change:  No intake or output data in the 24 hours ending 07/09/11 0908 Physical Exam: General appearance: alert and no distress HEENT: Patient has functional vision for large shapes, unable to read fine details including printed text, EOMI, no scleral icterus Lungs: clear to auscultation bilaterally, moving normal volumes of air Heart: RRR, no rubs, murmurs, or gallops Abdomen: soft, nontender, nondistended, BS present Extremities: warm and well perfused, no pedal edema, no calf tenderness Neurologic: alert and oriented x3, CN II-XII grossly intact  Lab Results: Basic Metabolic Panel:  Lab 07/09/11 9604  NA 138  K 3.7  CL 103  CO2 27  GLUCOSE 92  BUN 14  CREATININE 0.95  CALCIUM 10.1  MG --  PHOS --  CBC:  Lab 07/09/11 0610 07/08/11 1623  WBC 5.8 5.8  NEUTROABS -- --  HGB 13.6 13.0  HCT 41.6 42.0  MCV 97.4 99.3  PLT 193 181   Coagulation:  Lab 07/09/11 0610  LABPROT 13.9  INR 1.05   Studies/Results: No results found.  Medications: I have reviewed the patient's current medications. Scheduled Meds:   . amLODipine  10 mg Oral Daily  . coumadin book   Does not apply Once  . enoxaparin (LOVENOX) injection  75 mg Subcutaneous Q12H  .  lisinopril  10 mg Oral Daily  . nicotine  21 mg Transdermal Daily  . warfarin  6 mg Oral ONCE-1800  . warfarin   Does not apply Once  . DISCONTD: nicotine  14 mg Transdermal Daily   Assessment/Plan: Femoral DVT (deep venous thrombosis) - Patient was started on lovenox injections and coumadin by mouth.   A baseline CBC and PT/INR were obtained.  VTE continues to be asymptomatic.  Case manager stated that patient would not qualify for [facility] for home injections.  Patient states that she would be able to give injections on her own as long as they were pre-set to be given as a single injection and would require no dose adjustment on her part given her visual impairment.  Patient was started on 75mg  BID of Lovenox per pharmacy recs.  It is feasible to send her home with 80mg  syringes and allow her to inject the entire dose.  Another option would be to have her son come and administer the injections, although this is unlikely given his work schedule.  A third option would be to follow up with the electric company to delay her payment and administer her lovenox as an inpatient until she is successfully titrated up on her Coumadin  TOBACCO ABUSE - Nicotine patch was given in the  hospital.  Patient stated that she was interested in quitting smoking on admission, but wanted to know if she could acquire the patches at home from her insurance since she is on a fixed income.  Per prior records, patient has had prescriptions written for nicotine patches. Will follow-up with patient to see why these haven't been filled.    HYPERTENSION - Stable and wnl, BP was 149/67 on admission and has trended down to 129/70 this AM.  Will continue to monitor her on her home medications of lisinopril and amlodipine.  No adjustment in her regimen needed as of right now, although her low dose of lisinopril presents and opportunity for an increase instead of the addition of a new medication.  Disposition - Patient is currently on  observation status.  The goal is to discharge her today given the availability of her to administer her Lovenox injections at home.  Her son works in Aeronautical engineer outside the hospital and would be her ride home.  DVT ppx - VTE recently diagnosed and patient is on lovenox and coumadin.    LOS: 1 day   This is a Psychologist, occupational Note.  The care of the patient was discussed with Dr. Dorise Hiss and the assessment and plan formulated with their assistance.  Please see their attached note for official documentation of the daily encounter.  [name] 07/09/2011, 9:08 AM

## 2011-07-09 NOTE — H&P (Signed)
Internal Medicine Attending Admission Note Date: 07/09/2011  Patient name: [name] Medical record number: [id number] Date of birth: [birth year] Age: 71 y.o. Gender: female  I saw and evaluated the patient. I reviewed the resident's note and I agree with the resident's findings and plan as documented in the resident's note.  Chief Complaint(s):  Right femoral vein clot  History - key components related to admission:  [name] is a 71 year old woman with a history of legal blindness and deafness who has recently been seen by a podiatrist to address a bony spur in her left foot. As part of the preoperative evaluation he noted decreased pulses in that foot and therefore order vascular studies to include a lower extremity Doppler. During this evaluation she was noted to have a small partially occluding right femoral vein clot. Of note, she had a history of a clot in the 1970's and was on anticoagulation for several years. She has not been on any anticoagulation since the mid 1980's. She denies any leg pain, fevers, shakes, chills, shortness of breath, chest pain, or recent trauma to her right leg. She does smoke but does not take any estrogen therapy. She has a history of a fall with a right sided wrist fracture but that was several years ago. At that time she states she tripped. She has not had any recent falls for several years. She is without acute complaints at this time.  Physical Exam - key components related to admission:  Filed Vitals:   07/08/11 1617 07/08/11 2000 07/08/11 2134 07/09/11 0612  BP: 149/67 132/64 127/67 129/70  Pulse: 66 66 67 63  Temp: 98.1 F (36.7 C) 98.1 F (36.7 C) 98 F (36.7 C) 97.7 F (36.5 C)  TempSrc:  Oral    Resp: 16 20 20 20   Height:  5\' 4"  (1.626 m)    Weight:  160 lb 3.2 oz (72.666 kg)    SpO2: 100%  96% 96%   General: Well-developed, well-nourished, woman sitting comfortably in bed in no acute distress. Lungs: Bilateral expiratory wheezes  without rhonchi or rales. Despite the wheezes air movement is good. Heart: Regular rate and rhythm distant heart sounds. Abdomen: Soft, non tender, active bowel sounds. Extremities: Left lower extremity edema that is 1+ pitting with an enlargement of the diameter of the left calf compared to the right calf. Of note the clot is on the right side. [name] states the left lower extremity swelling is chronic.  Lab results:  Basic Metabolic Panel:  [facility] 07/09/11 0610  NA 138  K 3.7  CL 103  CO2 27  GLUCOSE 92  BUN 14  CREATININE 0.95  CALCIUM 10.1  MG --  PHOS --   CBC:  Basename 07/09/11 0610 07/08/11 1623  WBC 5.8 5.8  NEUTROABS -- --  HGB 13.6 13.0  HCT 41.6 42.0  MCV 97.4 99.3  PLT 193 181   Coagulation:  Basename 07/09/11 0610  INR 1.05   Assessment & Plan by Problem:  [name] is a 71 year old woman with a history of legal blindness and deafness who presents with an incidentally found right femoral vein clot. She is completely asymptomatic. Despite her legally blind status she is able to inject herself with Lovenox. She also has a history of remaining stable on her feet although is at some risk for fall as occurred several years ago resulting in a broken wrist. We spend a lot of time discussing the risks and the benefits of the Coumadin therapy  and the importance of being careful to avoid falls. As this may be a second unprovoked clot one could argue lifelong Coumadin is recommended. Nonetheless her increased risk of falls makes this less clear of a decision. The risks and benefits of continued anticoagulation in 3-6 months can be discussed with Ms. Para to decide if she would like to proceed with therapy thereafter.  1) Deep venous thrombosis: [name] will be treated acutely with Lovenox and taught by the nursing staff how to give her self these injections. We will pick a dose that is already in a pre-loaded syringe so that she is able to inject the whole  syringe given her visual problems and inability to dose any other way. We will also begin Coumadin and have her follow up in the Coumadin clinic with Dr. Michaell Cowing for further adjustments with a target INR of 2-3. As noted above, a discussion about continuing the therapy beyond 3-6 months can take place with [name] to discuss the risks and benefits in her particular case. I agree with the housestaff's plan to discharge [name] home today after she gets teaching for the Lovenox injections.

## 2011-07-10 DIAGNOSIS — M129 Arthropathy, unspecified: Secondary | ICD-10-CM | POA: Diagnosis not present

## 2011-07-10 DIAGNOSIS — Z7901 Long term (current) use of anticoagulants: Secondary | ICD-10-CM | POA: Diagnosis not present

## 2011-07-10 DIAGNOSIS — I10 Essential (primary) hypertension: Secondary | ICD-10-CM | POA: Diagnosis not present

## 2011-07-10 DIAGNOSIS — I82409 Acute embolism and thrombosis of unspecified deep veins of unspecified lower extremity: Secondary | ICD-10-CM | POA: Diagnosis not present

## 2011-07-10 DIAGNOSIS — Z5181 Encounter for therapeutic drug level monitoring: Secondary | ICD-10-CM | POA: Diagnosis not present

## 2011-07-14 ENCOUNTER — Ambulatory Visit (INDEPENDENT_AMBULATORY_CARE_PROVIDER_SITE_OTHER): Payer: Medicare Other | Admitting: Pharmacist

## 2011-07-14 DIAGNOSIS — I824Y9 Acute embolism and thrombosis of unspecified deep veins of unspecified proximal lower extremity: Secondary | ICD-10-CM | POA: Diagnosis not present

## 2011-07-14 DIAGNOSIS — Z7901 Long term (current) use of anticoagulants: Secondary | ICD-10-CM

## 2011-07-14 DIAGNOSIS — D6859 Other primary thrombophilia: Secondary | ICD-10-CM | POA: Diagnosis not present

## 2011-07-14 DIAGNOSIS — I82419 Acute embolism and thrombosis of unspecified femoral vein: Secondary | ICD-10-CM

## 2011-07-14 NOTE — Patient Instructions (Signed)
Patient instructed to take medications as defined in the Anti-coagulation Track section of this encounter.  Patient had ALREADY TAKEN today's dose based upon her hospital discharge instructions (1 tablet of 6mg  strength--confirmed with [employer] on [facility] at [phone]. She will COMMENCE tomorrow with new instructions provided from [facility].  Patient verbalized understanding of these instructions.

## 2011-07-14 NOTE — Discharge Summary (Signed)
Although [name] may have experienced an episode of recurrent venous thrombosis suggesting the need for lifelong therapy this is not an automatic conclusion.  With her blindness she is at greater risk for fall and harm from coumadin.  The length of time between clots is also likely important.  She should be reassessed at 3-6 months to assess the risks and benefits of continued coumadin therapy and what her thoughts are on the issue at that time.

## 2011-07-14 NOTE — Progress Notes (Signed)
Anti-Coagulation Progress Note  [name] is a 71 y.o. female who is currently on an anti-coagulation regimen.    RECENT RESULTS: Recent results are below, the most recent result is correlated with a dose of 42 mg. per week: Lab Results  Component Value Date   INR 1.80 07/14/2011   INR 1.05 07/09/2011   INR 0.97 09/13/2009    ANTI-COAG DOSE:   Latest dosing instructions   Total Sun Mon Tue Wed Thu Fri Sat   48 6 mg 6 mg 9 mg 9 mg 6 mg 6 mg 6 mg    (6 mg1) (6 mg1) (6 mg1.5) (6 mg1.5) (6 mg1) (6 mg1) (6 mg1)         ANTICOAG SUMMARY: Anticoagulation Episode Summary              Current INR goal 2.0-3.0 Next INR check 07/21/2011   INR from last check 1.80! (07/14/2011)     Weekly max dose (mg)  Target end date 10/14/2011   Indications Femoral DVT (deep venous thrombosis), Primary hypercoagulable state   [facility]    Send INR reminders to    Comments No documenation of this being a second or higher VTE. As such, for initial index idiopathic-no known provoking cause VTE--current CHEST Guidelines (AT9) recommends 3 months initial treatment and then evaluate at end of 3 monts--with advise and consent of the patient--any consideration for continued treatment beyond 3 months.             ANTICOAG TODAY: Anticoagulation Summary as of 07/14/2011              INR goal 2.0-3.0     Selected INR 1.80! (07/14/2011) Next INR check 07/21/2011   Weekly max dose (mg)  Target end date 10/14/2011   Indications Femoral DVT (deep venous thrombosis), Primary hypercoagulable state    Anticoagulation Episode Summary              INR check [facility]    Send INR reminders to    Comments No documenation of this being a second or higher VTE. As such, for initial index idiopathic-no known provoking cause VTE--current CHEST Guidelines (AT9) recommends 3 months initial treatment and then evaluate at end of 3 monts--with advise and  consent of the patient--any consideration for continued treatment beyond 3 months.             PATIENT INSTRUCTIONS: Patient Instructions  Patient instructed to take medications as defined in the Anti-coagulation Track section of this encounter.  Patient had ALREADY TAKEN today's dose based upon her hospital discharge instructions (1 tablet of 6mg  strength--confirmed with [employer] on [facility] at [phone]. She will COMMENCE tomorrow with new instructions provided from [facility].  Patient verbalized understanding of these instructions.        FOLLOW-UP Return in 7 days (on 07/21/2011) for Follow up INR.  Hulen Luster, III Pharm.D., CACP

## 2011-07-16 ENCOUNTER — Encounter: Payer: Self-pay | Admitting: Internal Medicine

## 2011-07-19 DIAGNOSIS — M129 Arthropathy, unspecified: Secondary | ICD-10-CM | POA: Diagnosis not present

## 2011-07-19 DIAGNOSIS — I10 Essential (primary) hypertension: Secondary | ICD-10-CM | POA: Diagnosis not present

## 2011-07-19 DIAGNOSIS — Z5181 Encounter for therapeutic drug level monitoring: Secondary | ICD-10-CM | POA: Diagnosis not present

## 2011-07-19 DIAGNOSIS — Z7901 Long term (current) use of anticoagulants: Secondary | ICD-10-CM | POA: Diagnosis not present

## 2011-07-19 DIAGNOSIS — I82409 Acute embolism and thrombosis of unspecified deep veins of unspecified lower extremity: Secondary | ICD-10-CM | POA: Diagnosis not present

## 2011-07-21 ENCOUNTER — Ambulatory Visit (INDEPENDENT_AMBULATORY_CARE_PROVIDER_SITE_OTHER): Payer: Medicare Other | Admitting: Pharmacist

## 2011-07-21 DIAGNOSIS — I824Y9 Acute embolism and thrombosis of unspecified deep veins of unspecified proximal lower extremity: Secondary | ICD-10-CM

## 2011-07-21 DIAGNOSIS — Z7901 Long term (current) use of anticoagulants: Secondary | ICD-10-CM | POA: Diagnosis not present

## 2011-07-21 DIAGNOSIS — I82419 Acute embolism and thrombosis of unspecified femoral vein: Secondary | ICD-10-CM

## 2011-07-21 DIAGNOSIS — D6859 Other primary thrombophilia: Secondary | ICD-10-CM | POA: Diagnosis not present

## 2011-07-21 LAB — POCT INR: INR: 2.5

## 2011-07-21 NOTE — Patient Instructions (Signed)
Patient instructed to take medications as defined in the Anti-coagulation Track section of this encounter.  Patient instructed to take today's dose.  Patient verbalized understanding of these instructions.    

## 2011-07-21 NOTE — Progress Notes (Signed)
Agree with Dr. Groce's assessment and management plan. 

## 2011-07-21 NOTE — Progress Notes (Signed)
Anti-Coagulation Progress Note  [name] is a 71 y.o. female who is currently on an anti-coagulation regimen.    RECENT RESULTS: Recent results are below, the most recent result is correlated with a dose of 48 mg. per week: Lab Results  Component Value Date   INR 2.5 07/21/2011   INR 1.80 07/14/2011   INR 1.05 07/09/2011    ANTI-COAG DOSE:   Latest dosing instructions   Total Sun Mon Tue Wed Thu Fri Sat   51 6 mg 9 mg 6 mg 9 mg 6 mg 9 mg 6 mg    (6 mg1) (6 mg1.5) (6 mg1) (6 mg1.5) (6 mg1) (6 mg1.5) (6 mg1)         ANTICOAG SUMMARY: Anticoagulation Episode Summary              Current INR goal 2.0-3.0 Next INR check 07/28/2011   INR from last check 2.5 (07/21/2011)     Weekly max dose (mg)  Target end date 10/14/2011   Indications Femoral DVT (deep venous thrombosis), Primary hypercoagulable state   [facility]    Send INR reminders to    Comments On SECOND VISIT--patient states 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 Additonally--now added to problem list is a diagnosis of primarly hypercoagulable state. Given this documentation--will give consideration--with advise and consent of patient--for long term/continued warfarin therapy.            ANTICOAG TODAY: Anticoagulation Summary as of 07/21/2011              INR goal 2.0-3.0     Selected INR 2.5 (07/21/2011) Next INR check 07/28/2011   Weekly max dose (mg)  Target end date 10/14/2011   Indications Femoral DVT (deep venous thrombosis), Primary hypercoagulable state    Anticoagulation Episode Summary              [facility]    Send INR reminders to    Comments On SECOND VISIT--patient states 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 Additonally--now added to problem  list is a diagnosis of primarly hypercoagulable state. Given this documentation--will give consideration--with advise and consent of patient--for long term/continued warfarin therapy.            PATIENT INSTRUCTIONS: Patient Instructions  Patient instructed to take medications as defined in the Anti-coagulation Track section of this encounter.  Patient instructed to take today's dose.  Patient verbalized understanding of these instructions.        FOLLOW-UP Return in 7 days (on 07/28/2011) for Follow up INR.  Hulen Luster, III Pharm.D., CACP

## 2011-07-22 ENCOUNTER — Emergency Department (HOSPITAL_COMMUNITY)
Admission: EM | Admit: 2011-07-22 | Discharge: 2011-07-22 | Disposition: A | Discharge status: Home Health | Payer: Medicare Other | Attending: Emergency Medicine | Admitting: Emergency Medicine

## 2011-07-22 DIAGNOSIS — M79609 Pain in unspecified limb: Secondary | ICD-10-CM | POA: Insufficient documentation

## 2011-07-22 DIAGNOSIS — I824Y9 Acute embolism and thrombosis of unspecified deep veins of unspecified proximal lower extremity: Secondary | ICD-10-CM | POA: Insufficient documentation

## 2011-07-22 DIAGNOSIS — Z86718 Personal history of other venous thrombosis and embolism: Secondary | ICD-10-CM | POA: Insufficient documentation

## 2011-07-22 DIAGNOSIS — I10 Essential (primary) hypertension: Secondary | ICD-10-CM | POA: Diagnosis not present

## 2011-07-22 DIAGNOSIS — I82419 Acute embolism and thrombosis of unspecified femoral vein: Secondary | ICD-10-CM

## 2011-07-22 LAB — CBC
HCT: 42.7 % (ref 36.0–46.0)
Hemoglobin: 14.6 g/dL (ref 12.0–15.0)
MCHC: 34.2 g/dL (ref 30.0–36.0)
MCV: 96.8 fL (ref 78.0–100.0)

## 2011-07-22 MED ORDER — HYDROCODONE-ACETAMINOPHEN 5-325 MG PO TABS
1.0000 | ORAL_TABLET | Freq: Once | ORAL | Status: AC
  Administered 2011-07-22: 1 via ORAL
  Filled 2011-07-22: qty 1

## 2011-07-22 MED ORDER — SODIUM CHLORIDE 0.9 % IV SOLN
Freq: Once | INTRAVENOUS | Status: DC
Start: 2011-07-22 — End: 2011-07-22

## 2011-07-22 MED ORDER — NICOTINE 21 MG/24HR TD PT24
21.0000 mg | MEDICATED_PATCH | Freq: Once | TRANSDERMAL | Status: DC
  Administered 2011-07-22: 21 mg via TRANSDERMAL
  Filled 2011-07-22: qty 1

## 2011-07-22 MED ORDER — HYDROCODONE-ACETAMINOPHEN 5-325 MG PO TABS: 1.0000 | ORAL_TABLET | Freq: Once | ORAL | Status: AC

## 2011-07-22 NOTE — ED Notes (Signed)
Pt has a hx of blood clots to R leg.  She had an [place] 2 weeks prior which showed a blood clot to her R upper thigh for which she has been taking coumadin.  She went to her Dr Monday for the pain and he told her she had "a patch of blood under her skin" and that she should use hot compresses. She tried hot compresses, but the pain is getting worse.  She states it feels like her dvt's in the past.

## 2011-07-22 NOTE — ED Provider Notes (Signed)
History     CSN: [id number]  Arrival date & time 07/22/11  0135   First MD Initiated Contact with Patient 07/22/11 0214      Chief Complaint  Patient presents with  . Leg Pain    (Consider location/radiation/quality/duration/timing/severity/associated sxs/prior treatment) Patient is a 71 y.o. female presenting with leg pain. The history is provided by the patient.  Leg Pain    Patient here complaining of right lower extremity pain. She is currently being treated for a DVT. She denies any chest pain or shortness of breath. Pain is worse with walking and made better with nothing. She had her INR checked yesterday and it was 2.5. She does not have any pain medication currently Past Medical History  Diagnosis Date  . Hypertension   . HEARING LOSS   . 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 Arthritis   . DVT (deep venous thrombosis)   . Phlebitis     Past Surgical History  Procedure Date  . Eye surgery   . Shoulder open rotator cuff repair 1973    right shoulder, secondary numbness in R hand  . Foot surgery   . Right wrist     Family History  Problem Relation Age of Onset  . Cancer Maternal Uncle     colon  . Cancer Maternal Grandmother     liver  . Cancer Mother     liver  . Cancer Brother     Unknown  . Cancer Sister     Unknown    History  Substance Use Topics  . Smoking status: Current Everyday Smoker -- 0.5 packs/day for 20 years    Types: Cigarettes  . Smokeless tobacco: Never Used  . Alcohol Use: No    OB History    Grav Para Term Preterm Abortions TAB SAB Ect Mult Living                  Review of Systems  All other systems reviewed and are negative.    Allergies  Codeine and Morphine  Home Medications   Current Outpatient Rx  Name Route Sig Dispense Refill  . AMLODIPINE BESYLATE 10 MG PO TABS Oral Take 1 tablet (10 mg total) by mouth daily. 30 tablet 11  . LISINOPRIL 10 MG PO TABS Oral Take 1 tablet (10 mg  total) by mouth daily. 30 tablet 11  . WARFARIN SODIUM 6 MG PO TABS Oral Take 6-9 mg by mouth daily. Take 1.5 (9mg ) on Monday, Wednesday, & Friday Take 1 tablet (6mg ) all other days      BP 127/61  Pulse 73  Temp(Src) 97.1 F (36.2 C) (Oral)  Resp 14  SpO2 98%  Physical Exam  Constitutional: Vital signs are normal.  Musculoskeletal:       Right lower extremity with bruise at the mid anterior tibial area. No erythema or warmth to the touch. No crepitus. No edema    ED Course  Procedures (including critical care time)   Labs Reviewed  CBC  DIFFERENTIAL  BASIC METABOLIC PANEL  PROTIME-INR  APTT   No results found.   No diagnosis found.    MDM  Pt given pain meds and will d/c to home        Toy Baker, MD 07/22/11 [phone]

## 2011-07-22 NOTE — ED Notes (Signed)
Small hematoma noted to R ankle.  Pain on palpation and movement.

## 2011-07-24 DIAGNOSIS — M129 Arthropathy, unspecified: Secondary | ICD-10-CM | POA: Diagnosis not present

## 2011-07-24 DIAGNOSIS — I82409 Acute embolism and thrombosis of unspecified deep veins of unspecified lower extremity: Secondary | ICD-10-CM | POA: Diagnosis not present

## 2011-07-24 DIAGNOSIS — Z7901 Long term (current) use of anticoagulants: Secondary | ICD-10-CM | POA: Diagnosis not present

## 2011-07-24 DIAGNOSIS — I10 Essential (primary) hypertension: Secondary | ICD-10-CM | POA: Diagnosis not present

## 2011-07-24 DIAGNOSIS — Z5181 Encounter for therapeutic drug level monitoring: Secondary | ICD-10-CM | POA: Diagnosis not present

## 2011-07-28 ENCOUNTER — Encounter: Payer: Self-pay | Admitting: Internal Medicine

## 2011-07-28 ENCOUNTER — Ambulatory Visit (INDEPENDENT_AMBULATORY_CARE_PROVIDER_SITE_OTHER): Payer: Medicare Other | Admitting: Pharmacist

## 2011-07-28 ENCOUNTER — Ambulatory Visit (INDEPENDENT_AMBULATORY_CARE_PROVIDER_SITE_OTHER): Payer: Medicare Other | Admitting: Internal Medicine

## 2011-07-28 VITALS — BP 120/60 | HR 70 | Temp 97.0°F | Ht 64.0 in | Wt 159.4 lb

## 2011-07-28 DIAGNOSIS — D6859 Other primary thrombophilia: Secondary | ICD-10-CM | POA: Diagnosis not present

## 2011-07-28 DIAGNOSIS — L02419 Cutaneous abscess of limb, unspecified: Secondary | ICD-10-CM | POA: Diagnosis not present

## 2011-07-28 DIAGNOSIS — Z7901 Long term (current) use of anticoagulants: Secondary | ICD-10-CM | POA: Diagnosis not present

## 2011-07-28 DIAGNOSIS — I824Y9 Acute embolism and thrombosis of unspecified deep veins of unspecified proximal lower extremity: Secondary | ICD-10-CM

## 2011-07-28 DIAGNOSIS — I82419 Acute embolism and thrombosis of unspecified femoral vein: Secondary | ICD-10-CM

## 2011-07-28 DIAGNOSIS — L03119 Cellulitis of unspecified part of limb: Secondary | ICD-10-CM | POA: Insufficient documentation

## 2011-07-28 LAB — POCT INR: INR: 4.1

## 2011-07-28 MED ORDER — DOXYCYCLINE HYCLATE 100 MG PO TABS: 100.0000 mg | ORAL_TABLET | Freq: Two times a day (BID) | ORAL | Status: AC

## 2011-07-28 NOTE — Patient Instructions (Signed)
Patient instructed to take medications as defined in the Anti-coagulation Track section of this encounter.  Patient instructed to OMIT/SKIP/DO NOT TAKE today's dose.  Patient verbalized understanding of these instructions.

## 2011-07-28 NOTE — Assessment & Plan Note (Signed)
Patient has cellulitis with small boil formation involving the medial aspect of her right ankle.  There is no evidence of joint involvement and she does not appear systemically ill; currently afebrile and hemodynamically stable.  Patient declined full incision and drainage performed with a scalpel and topical anesthetics today but agreed to a non-and exercise attempt by needle.    Bedside I&D was performed.  Skin was prepped with topical iodine.  A sterile 25-gauge needle was inserted into the center of the area of fluctuance with return of scant purulent; no significant purulence was expressed.  Patient tolerated the procedure fairly well with no complication.    Will treat her empirically with a 10 day course of doxycycline. She will followup in the clinic next Monday at which time her response to antibiotics will be assessed.  Informed patient that if the area of fluctuance increases or she develops an abscess, then complete incision and drainage will need to be performed as part of her treatment course.  Patient states she understands and is in agreement with this plan. Informed patient to not skip any doses of her antibiotic and to be sure to complete the full 10 day course. Advised her to return to the clinic or go to the emergency room if she develops any fever, rigors, worsening pain, inability to flex/extend/rotate her right ankle, inability to walk, AMS, or other concerning symptom.

## 2011-07-28 NOTE — Progress Notes (Signed)
Anti-Coagulation Progress Note  [name] is a 71 y.o. female who is currently on an anti-coagulation regimen.    RECENT RESULTS: Recent results are below, the most recent result is correlated with a dose of 51 mg. per week: Lab Results  Component Value Date   INR 4.1 07/28/2011   INR 2.59* 07/22/2011   INR 2.5 07/21/2011    ANTI-COAG DOSE:   Latest dosing instructions   Total Sun Mon Tue Wed Thu Fri Sat   45 6 mg 6 mg 6 mg 9 mg 6 mg 6 mg 6 mg    (6 mg1) (6 mg1) (6 mg1) (6 mg1.5) (6 mg1) (6 mg1) (6 mg1)         ANTICOAG SUMMARY: Anticoagulation Episode Summary              Current INR goal 2.0-3.0 Next INR check 08/04/2011   INR from last check 4.1! (07/28/2011)     Weekly max dose (mg)  Target end date 10/14/2011   Indications Femoral DVT (deep venous thrombosis), Primary hypercoagulable state   [facility]    Send INR reminders to    Comments On SECOND VISIT--patient states 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 Additonally--now added to problem list is a diagnosis of primarly hypercoagulable state. Given this documentation--will give consideration--with advise and consent of patient--for long term/continued warfarin therapy.            ANTICOAG TODAY: Anticoagulation Summary as of 07/28/2011              INR goal 2.0-3.0     Selected INR 4.1! (07/28/2011) Next INR check 08/04/2011   Weekly max dose (mg)  Target end date 10/14/2011   Indications Femoral DVT (deep venous thrombosis), Primary hypercoagulable state    Anticoagulation Episode Summary              [facility] check [facility]    Send INR reminders to    Comments On SECOND VISIT--patient states 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 Additonally--now added to problem  list is a diagnosis of primarly hypercoagulable state. Given this documentation--will give consideration--with advise and consent of patient--for long term/continued warfarin therapy.            PATIENT INSTRUCTIONS: Patient Instructions  Patient instructed to take medications as defined in the Anti-coagulation Track section of this encounter.  Patient instructed to OMIT/SKIP/DO NOT TAKE today's dose.  Patient verbalized understanding of these instructions.        FOLLOW-UP Return in 7 days (on 08/04/2011) for Follow up INR.  Hulen Luster, III Pharm.D., CACP

## 2011-07-28 NOTE — Progress Notes (Signed)
Subjective:     Patient ID: [name], female   DOB: 23-Dec-1940, 71 y.o.   MRN: [id number]  HPI Pt is here for acute c/o ankle pain.  She first noticed a problem on her right ankle 2 weeks ago.  She first noticed swelling and redness 2 weeks ago and states the area has increased in size and become progressively painful.  She reports similar symptoms in the 1970's and was told her symptoms were the result of a superficial blood problem.  She describes the pain as a 10/10 pain like a "boil" the worsens with walking, touch and improves with rest.  She tired taking 2 500mg  OTC pain pills with mild improvement of the symptoms.  She denies fevers, rigors, tingling/numbneses/weakness, nausea, vomiting, or diarrhea.    Review of Systems  Constitutional: Negative for fever, chills, diaphoresis, activity change, appetite change, fatigue and unexpected weight change.  HENT: Negative for hearing loss, congestion and neck stiffness.   Eyes: Negative for photophobia, pain and visual disturbance.  Respiratory: Negative for cough, chest tightness, shortness of breath and wheezing.   Cardiovascular: Negative for chest pain and palpitations.  Gastrointestinal: Negative for abdominal pain, blood in stool and anal bleeding.  Genitourinary: Negative for dysuria, hematuria and difficulty urinating.  Musculoskeletal: Negative for joint swelling.  Neurological: Negative for dizziness, syncope, speech difficulty, weakness, numbness and headaches.      Objective:   Physical Exam Vital signs reviewed and stable. GEN: No apparent distress.  Alert and oriented x 3.  Pleasant, conversant, and cooperative to exam. RESP:  Lungs are clear to ascultation bilaterally with good air movement.  No wheezes, ronchi, or rubs. CARDIOVASCULAR: regular rate, normal rhythm.  Clear S1, S2, no murmurs, gallops, or rubs. EXT: warm and dry. SKIN: warm and dry with normal turgor.  No rashes  Observed. ~1.5 x 1.5cm well-circumscribed,  erythematous, fluctuant circular lesion on the medial ankle just superior and anterior to the malleolus.  This lesion is surrounded by ~1" of induration in all directions.  The area is tender to palpation.  Range of motion of ankle grossly intact; no pain with passive or active range of motion     Assessment/Plan:

## 2011-07-31 DIAGNOSIS — S52599A Other fractures of lower end of unspecified radius, initial encounter for closed fracture: Secondary | ICD-10-CM | POA: Diagnosis not present

## 2011-07-31 DIAGNOSIS — G5600 Carpal tunnel syndrome, unspecified upper limb: Secondary | ICD-10-CM | POA: Diagnosis not present

## 2011-08-01 DIAGNOSIS — M129 Arthropathy, unspecified: Secondary | ICD-10-CM | POA: Diagnosis not present

## 2011-08-01 DIAGNOSIS — I10 Essential (primary) hypertension: Secondary | ICD-10-CM | POA: Diagnosis not present

## 2011-08-01 DIAGNOSIS — Z5181 Encounter for therapeutic drug level monitoring: Secondary | ICD-10-CM | POA: Diagnosis not present

## 2011-08-01 DIAGNOSIS — I82409 Acute embolism and thrombosis of unspecified deep veins of unspecified lower extremity: Secondary | ICD-10-CM | POA: Diagnosis not present

## 2011-08-01 DIAGNOSIS — Z7901 Long term (current) use of anticoagulants: Secondary | ICD-10-CM | POA: Diagnosis not present

## 2011-08-04 ENCOUNTER — Ambulatory Visit (INDEPENDENT_AMBULATORY_CARE_PROVIDER_SITE_OTHER): Payer: Medicare Other | Admitting: Pharmacist

## 2011-08-04 ENCOUNTER — Ambulatory Visit: Payer: Self-pay | Admitting: Internal Medicine

## 2011-08-04 DIAGNOSIS — D6859 Other primary thrombophilia: Secondary | ICD-10-CM

## 2011-08-04 DIAGNOSIS — Z7901 Long term (current) use of anticoagulants: Secondary | ICD-10-CM

## 2011-08-04 DIAGNOSIS — I824Y9 Acute embolism and thrombosis of unspecified deep veins of unspecified proximal lower extremity: Secondary | ICD-10-CM

## 2011-08-04 DIAGNOSIS — I82419 Acute embolism and thrombosis of unspecified femoral vein: Secondary | ICD-10-CM

## 2011-08-04 LAB — POCT INR: INR: 4.5

## 2011-08-04 MED ORDER — WARFARIN SODIUM 6 MG PO TABS: ORAL_TABLET | ORAL | Status: DC

## 2011-08-04 NOTE — Patient Instructions (Signed)
Patient instructed to take medications as defined in the Anti-coagulation Track section of this encounter.  Patient instructed to OMIT/HOLD/DO NOT TAKE today's dose.  Patient verbalized understanding of these instructions.

## 2011-08-04 NOTE — Progress Notes (Signed)
Addended by: Hulen Luster B on: 08/04/2011 12:15 PM   Modules accepted: Orders

## 2011-08-04 NOTE — Progress Notes (Signed)
Anti-Coagulation Progress Note  [name] is a 71 y.o. female who is currently on an anti-coagulation regimen.    RECENT RESULTS: Recent results are below, the most recent result is correlated with a dose of 45 mg. per week:  She is going to OMIT today's dose; decrease to 39mg /wk; follow up INR next Monday AND see a PCP here in [facility] for wound re-check. Lab Results  Component Value Date   INR 4.50 08/04/2011   INR 4.1 07/28/2011   INR 2.59* 07/22/2011    ANTI-COAG DOSE:   Latest dosing instructions   Total Sun Mon Tue Wed Thu Fri Sat   39 6 mg 6 mg 6 mg 3 mg 6 mg 6 mg 6 mg    (6 mg1) (6 mg1) (6 mg1) (6 mg0.5) (6 mg1) (6 mg1) (6 mg1)         ANTICOAG SUMMARY: Anticoagulation Episode Summary              Current INR goal 2.0-3.0 Next INR check 08/11/2011   INR from last check 4.50! (08/04/2011)     Weekly max dose (mg)  Target end date 10/14/2011   Indications Femoral DVT (deep venous thrombosis), Primary hypercoagulable state   [facility]    Send INR reminders to    Comments On SECOND VISIT--patient states 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 Additonally--now added to problem list is a diagnosis of primarly hypercoagulable state. Given this documentation--will give consideration--with advise and consent of patient--for long term/continued warfarin therapy.            ANTICOAG TODAY: Anticoagulation Summary as of 08/04/2011              INR goal 2.0-3.0     Selected INR 4.50! (08/04/2011) Next INR check 08/11/2011   Weekly max dose (mg)  Target end date 10/14/2011   Indications Femoral DVT (deep venous thrombosis), Primary hypercoagulable state    Anticoagulation Episode Summary              [facility] check [facility]    Send INR reminders to    Comments On SECOND VISIT--patient states she had several episodes of VTE in  the '70s - '80s---as such, she may be a candidate for continued, indefinite warfarin therapy. Additonally--now added to problem list is a diagnosis of primarly hypercoagulable state. Given this documentation--will give consideration--with advise and consent of patient--for long term/continued warfarin therapy.            PATIENT INSTRUCTIONS: Patient Instructions  Patient instructed to take medications as defined in the Anti-coagulation Track section of this encounter.  Patient instructed to OMIT/HOLD/DO NOT TAKE today's dose.  Patient verbalized understanding of these instructions.        FOLLOW-UP Return in 7 days (on 08/11/2011) for Follow up INR; Follow up wound exam..  Hulen Luster, III Pharm.D., CACP

## 2011-08-11 ENCOUNTER — Ambulatory Visit: Payer: Self-pay | Admitting: Internal Medicine

## 2011-08-11 ENCOUNTER — Other Ambulatory Visit: Payer: Self-pay

## 2011-08-15 ENCOUNTER — Ambulatory Visit (INDEPENDENT_AMBULATORY_CARE_PROVIDER_SITE_OTHER): Payer: Medicare Other | Admitting: Internal Medicine

## 2011-08-15 ENCOUNTER — Encounter: Payer: Self-pay | Admitting: Internal Medicine

## 2011-08-15 VITALS — BP 126/80 | HR 76 | Temp 96.9°F | Ht 64.0 in | Wt 161.2 lb

## 2011-08-15 DIAGNOSIS — Z0000 Encounter for general adult medical examination without abnormal findings: Secondary | ICD-10-CM

## 2011-08-15 DIAGNOSIS — E785 Hyperlipidemia, unspecified: Secondary | ICD-10-CM | POA: Diagnosis not present

## 2011-08-15 DIAGNOSIS — I82409 Acute embolism and thrombosis of unspecified deep veins of unspecified lower extremity: Secondary | ICD-10-CM | POA: Diagnosis not present

## 2011-08-15 DIAGNOSIS — I10 Essential (primary) hypertension: Secondary | ICD-10-CM | POA: Diagnosis not present

## 2011-08-15 LAB — LIPID PANEL
Cholesterol: 190 mg/dL (ref 0–200)
HDL: 66 mg/dL (ref >39)
LDL Cholesterol: 108 mg/dL — ABNORMAL HIGH (ref 0–99)
Total CHOL/HDL Ratio: 2.9 Ratio
Triglycerides: 80 mg/dL (ref <150)
VLDL: 16 mg/dL (ref 0–40)

## 2011-08-15 MED ORDER — AMLODIPINE BESYLATE 10 MG PO TABS: 10.0000 mg | ORAL_TABLET | Freq: Every day | ORAL | Status: DC

## 2011-08-15 MED ORDER — LISINOPRIL 10 MG PO TABS: 10.0000 mg | ORAL_TABLET | Freq: Every day | ORAL | Status: DC

## 2011-08-15 MED ORDER — RIVAROXABAN 20 MG PO TABS: 1.0000 | ORAL_TABLET | Freq: Every evening | ORAL | Status: DC

## 2011-08-15 NOTE — Patient Instructions (Signed)
Please, STOP taking WARFARIN!!! Pick up a new Prescription from [facility] pharmacy for: Xarelto 20 mg one tablet by mouth every evening with food. Please, call immediately and/or go to ER if you notice bruising, and/or bleeding from anywhere. Follow up with Dr. Allena Katz (PCP) in 1 month and call with any questions.

## 2011-08-18 ENCOUNTER — Telehealth: Payer: Self-pay | Admitting: Pharmacist

## 2011-08-18 ENCOUNTER — Encounter: Payer: Self-pay | Admitting: Internal Medicine

## 2011-08-18 NOTE — Telephone Encounter (Signed)
Patient called citing that her Rx for Xarelto (rivaroxaban) was not ready at her Rx ([facility] [address] [place]). We had to have THIS Rx filled at THIS Rx on Friday because HER regular Pharmacy does not carry this drug. I called the Rx in on Friday 5-APP-13. Dr. Denton Meek documented the new Rx on her medication list. I called the Rx today and the pharmacist remembered this phone in Rx--she was the RPh who took the phone in from me last Friday. She stated that the patient was apprised that the Rx WAS THERE awaiting her to pick it up. We have patient documented as a medicare and medicaid patient. RPh at site states that the computer system there kicked out the Rx. Patient was called by Inocencio Homes in Triage and advised that the RX was there awaiting her to pick it up--and that the patient needs to bring CURRENT MEDICAID CARD AND MEDICARE CARDS to Rx. Patient was AGAIN reminded that she cannot have interruption of this drug therapy without risk of recurrence of her embolic disease. (I had provided her samples to last until TODAY).

## 2011-08-18 NOTE — Progress Notes (Signed)
Patient ID: [name], female   DOB: October 13, 1940, 71 y.o.   MRN: [id number] HPI:    1. RF on meds. Denies any concerns. Review of Systems: Negative except per history of present illness  Physical Exam:  Nursing notes and vitals reviewed General:  alert, well-developed, and cooperative to examination.   Lungs:  normal respiratory effort, no accessory muscle use, normal breath sounds, no crackles, and no wheezes. Heart:  normal rate, regular rhythm, no murmurs, no gallop, and no rub.   Abdomen:  soft, non-tender, normal bowel sounds, no distention, no guarding, no rebound tenderness, no hepatomegaly, and no splenomegaly.   Extremities:  No cyanosis, clubbing, edema Neurologic:  alert & oriented X3, nonfocal exam  Meds: No current outpatient prescriptions on file as of 08/15/2011.   No current facility-administered medications on file as of 08/15/2011.    Allergies: Codeine and Morphine Past Medical History  Diagnosis Date  . Hypertension   . HEARING LOSS   . 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 Arthritis   . DVT (deep venous thrombosis)   . Phlebitis    Past Surgical History  Procedure Date  . Eye surgery   . Shoulder open rotator cuff repair 1973    right shoulder, secondary numbness in R hand  . Foot surgery   . Right wrist    Family History  Problem Relation Age of Onset  . Cancer Maternal Uncle     colon  . Cancer Maternal Grandmother     liver  . Cancer Mother     liver  . Cancer Brother     Unknown  . Cancer Sister     Unknown   History   Social History  . Marital Status: Divorced    Spouse Name: N/A    Number of Children: N/A  . Years of Education: N/A   Occupational History  . Not on file.   Social History Main Topics  . Smoking status: Current Everyday Smoker -- 0.5 packs/day for 20 years    Types: Cigarettes  . Smokeless tobacco: Never Used  . Alcohol Use: No  . Drug Use: No  . Sexually Active: No   Other  Topics Concern  . Not on file   Social History Narrative   Patient currently lives at home alone, but does have a home worker who visits her throughout the week to help with things around the house.  Patient states that she still tries to remain active and walks daily.  She is has been divorced for sometime and has one son.  She currently lives off social security.  Patient reports using approximately half a pack of cigarettes daily for 20 years.  She denies any alcohol or illicit drug use.  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      A/P: 1.  Sp Femoral DVT -missed her appointment with Dr. Alexandria Lodge this week. -Spoke with Dr. Alexandria Lodge -will change therapy from Warfarin to rivaroxaban -patient is to follow up with Dr. Allena Katz (PCP) in 2-4 weeks

## 2011-08-19 ENCOUNTER — Other Ambulatory Visit: Payer: Self-pay | Admitting: Internal Medicine

## 2011-08-19 ENCOUNTER — Telehealth: Payer: Self-pay | Admitting: *Deleted

## 2011-08-19 DIAGNOSIS — I82409 Acute embolism and thrombosis of unspecified deep veins of unspecified lower extremity: Secondary | ICD-10-CM

## 2011-08-19 MED ORDER — WARFARIN SODIUM 6 MG PO TABS: ORAL_TABLET | ORAL | Status: DC

## 2011-08-19 NOTE — Telephone Encounter (Signed)
Returned pt's call.  Pt stated she went to [facility] and the cost for Xarelto was $284.00 (confirmed by the pharmacist) for 30 tabs. She cannot afford this. And the type of Medicare she has does not cover this medication and Medicaid will not cover it neither. Wanted to know why she was changed from  Warfarin.  Stated she only missed 1 appt w/James Groce. When do you want her to see J. Alexandria Lodge?  She took the Xarelto samples over the weekend; has not had any medication yesterday or today per pt. Send new rx to [facility] on [address]. Thanks

## 2011-08-19 NOTE — Progress Notes (Signed)
Appt scheduled Mon 08/25/11; pt awared.

## 2011-08-19 NOTE — Telephone Encounter (Signed)
Coumadin rx electronically sent to [facility] pharmacy per Dr. Denton Meek.  Appt has been scheduled with Dr. Alexandria Lodge Monday the 15th at 1100AM. Pt was called and made awared.

## 2011-08-20 ENCOUNTER — Telehealth: Payer: Self-pay | Admitting: *Deleted

## 2011-08-20 NOTE — Telephone Encounter (Signed)
Pt had called this morning stating her insurance will not cover new Coumadin refill b/c too soon; has not been 30 days. Stated she was instructed to throw  Away her Coumadin tab when she was started on Xarelto.  And she does not have $ 26.00 to pay for the Coumadin. I called Chancy Milroy. He agreed to give pt samples. Pt was made awared. 1215PM - Coumadin samples and instruction sheet were given to pt's aide, Cindra Presume, who came to pick them up.

## 2011-08-25 ENCOUNTER — Ambulatory Visit (INDEPENDENT_AMBULATORY_CARE_PROVIDER_SITE_OTHER): Payer: Medicare Other | Admitting: Pharmacist

## 2011-08-25 DIAGNOSIS — D6859 Other primary thrombophilia: Secondary | ICD-10-CM | POA: Diagnosis not present

## 2011-08-25 DIAGNOSIS — I82419 Acute embolism and thrombosis of unspecified femoral vein: Secondary | ICD-10-CM

## 2011-08-25 DIAGNOSIS — I824Y9 Acute embolism and thrombosis of unspecified deep veins of unspecified proximal lower extremity: Secondary | ICD-10-CM | POA: Diagnosis not present

## 2011-08-25 LAB — POCT INR: INR: 1.7

## 2011-08-25 NOTE — Progress Notes (Signed)
Anti-Coagulation Progress Note  [name] is a 71 y.o. female who is currently on an anti-coagulation regimen.    RECENT RESULTS: Recent results are below, the most recent result is correlated with a dose of 39 mg. per week: Lab Results  Component Value Date   INR 1.70 08/25/2011   INR 4.50 08/04/2011   INR 4.1 07/28/2011    ANTI-COAG DOSE:   Latest dosing instructions   Total Sun Mon Tue Wed Thu Fri Sat   48 6 mg 9 mg 6 mg 9 mg 6 mg 6 mg 6 mg    (6 mg1) (6 mg1.5) (6 mg1) (6 mg1.5) (6 mg1) (6 mg1) (6 mg1)         ANTICOAG SUMMARY: Anticoagulation Episode Summary              Current INR goal 2.0-3.0 Next INR check 09/08/2011   INR from last check 1.70! 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 Additonally--now added to problem list is a diagnosis of primarly hypercoagulable state. Given this documentation--will give consideration--with advise and consent of patient--for long term/continued warfarin therapy.            ANTICOAG TODAY: Anticoagulation Summary as of 08/25/2011              INR goal 2.0-3.0     Selected INR 1.70! 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 Additonally--now added to problem  list is a diagnosis of primarly hypercoagulable state. Given this documentation--will give consideration--with advise and consent of patient--for long term/continued warfarin therapy.            PATIENT INSTRUCTIONS: Patient Instructions  Patient instructed to take medications as defined in the Anti-coagulation Track section of this encounter.  Patient instructed to take today's dose.  Patient verbalized understanding of these instructions.        FOLLOW-UP Return in 2 weeks (on 09/08/2011) for Follow up INR at 1100h.  Hulen Luster, III Pharm.D., CACP

## 2011-08-25 NOTE — Patient Instructions (Signed)
Patient instructed to take medications as defined in the Anti-coagulation Track section of this encounter.  Patient instructed to take today's dose.  Patient verbalized understanding of these instructions.    

## 2011-08-29 NOTE — Progress Notes (Signed)
Addended by: Neomia Dear on: 08/29/2011 04:11 PM   Modules accepted: Orders

## 2011-09-08 ENCOUNTER — Ambulatory Visit (INDEPENDENT_AMBULATORY_CARE_PROVIDER_SITE_OTHER): Payer: Medicare Other | Admitting: Pharmacist

## 2011-09-08 DIAGNOSIS — D6859 Other primary thrombophilia: Secondary | ICD-10-CM

## 2011-09-08 DIAGNOSIS — I824Y9 Acute embolism and thrombosis of unspecified deep veins of unspecified proximal lower extremity: Secondary | ICD-10-CM | POA: Diagnosis not present

## 2011-09-08 DIAGNOSIS — I82419 Acute embolism and thrombosis of unspecified femoral vein: Secondary | ICD-10-CM

## 2011-09-08 NOTE — Patient Instructions (Signed)
Patient instructed to take medications as defined in the Anti-coagulation Track section of this encounter.  Patient instructed to take today's dose.  Patient verbalized understanding of these instructions.    

## 2011-09-08 NOTE — Progress Notes (Signed)
Anti-Coagulation Progress Note  [name] is a 71 y.o. female who is currently on an anti-coagulation regimen.    RECENT RESULTS: Recent results are below, the most recent result is correlated with a dose of 48 mg. per week: Lab Results  Component Value Date   INR 3.0 09/08/2011   INR 1.70 08/25/2011   INR 4.50 08/04/2011    ANTI-COAG DOSE:   Latest dosing instructions   Total Sun Mon Tue Wed Thu Fri Sat   45 6 mg 6 mg 6 mg 9 mg 6 mg 6 mg 6 mg    (6 mg1) (6 mg1) (6 mg1) (6 mg1.5) (6 mg1) (6 mg1) (6 mg1)         ANTICOAG SUMMARY: Anticoagulation Episode Summary              Current INR goal 2.0-3.0 Next INR check 09/22/2011   INR from last check 3.0 (09/08/2011)     Weekly max dose (mg)  Target end date 10/14/2011   Indications Femoral DVT (deep venous thrombosis), Primary hypercoagulable state   [facility]    Send INR reminders to    Comments On SECOND VISIT--patient states 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 Additonally--now added to problem list is a diagnosis of primarly hypercoagulable state. Given this documentation--will give consideration--with advise and consent of patient--for long term/continued warfarin therapy.            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 Additonally--now added to problem list  is a diagnosis of primarly hypercoagulable state. Given this documentation--will give consideration--with advise and consent of patient--for long term/continued warfarin therapy.            PATIENT INSTRUCTIONS: Patient Instructions  Patient instructed to take medications as defined in the Anti-coagulation Track section of this encounter.  Patient instructed to take today's dose.  Patient verbalized understanding of these instructions.        FOLLOW-UP Return in 2 weeks (on 09/22/2011) for Follow up INR.  Hulen Luster, III Pharm.D., CACP

## 2011-09-22 ENCOUNTER — Ambulatory Visit (INDEPENDENT_AMBULATORY_CARE_PROVIDER_SITE_OTHER): Payer: Medicare Other | Admitting: Pharmacist

## 2011-09-22 DIAGNOSIS — I824Y9 Acute embolism and thrombosis of unspecified deep veins of unspecified proximal lower extremity: Secondary | ICD-10-CM | POA: Diagnosis not present

## 2011-09-22 DIAGNOSIS — I82419 Acute embolism and thrombosis of unspecified femoral vein: Secondary | ICD-10-CM

## 2011-09-22 DIAGNOSIS — D6859 Other primary thrombophilia: Secondary | ICD-10-CM

## 2011-09-22 NOTE — Patient Instructions (Signed)
Patient instructed to take medications as defined in the Anti-coagulation Track section of this encounter.  Patient instructed to take today's dose.  Patient verbalized understanding of these instructions.    

## 2011-09-22 NOTE — Progress Notes (Signed)
Anti-Coagulation Progress Note  [name] is a 71 y.o. female who is currently on an anti-coagulation regimen.    RECENT RESULTS: Recent results are below, the most recent result is correlated with a dose of 45 mg. per week: Lab Results  Component Value Date   INR 3.30 09/22/2011   INR 3.0 09/08/2011   INR 1.70 08/25/2011    ANTI-COAG DOSE:   Latest dosing instructions   Total Sun Mon Tue Wed Thu Fri Sat   42 6 mg 6 mg 6 mg 6 mg 6 mg 6 mg 6 mg    (6 mg1) (6 mg1) (6 mg1) (6 mg1) (6 mg1) (6 mg1) (6 mg1)         ANTICOAG SUMMARY: Anticoagulation Episode Summary              Current INR goal 2.0-3.0 Next INR check 10/13/2011   INR from last check 3.30! 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 Additonally--now added to problem list is a diagnosis of primarly hypercoagulable state. Given this documentation--will give consideration--with advise and consent of patient--for long term/continued warfarin therapy.            ANTICOAG TODAY: Anticoagulation Summary as of 09/22/2011              INR goal 2.0-3.0     Selected INR 3.30! 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 Additonally--now added to problem list  is a diagnosis of primarly hypercoagulable state. Given this documentation--will give consideration--with advise and consent of patient--for long term/continued warfarin therapy.            PATIENT INSTRUCTIONS: Patient Instructions  Patient instructed to take medications as defined in the Anti-coagulation Track section of this encounter.  Patient instructed to take today's dose.  Patient verbalized understanding of these instructions.        FOLLOW-UP Return in 3 weeks (on 10/13/2011) for Follow up INR.  Hulen Luster, III Pharm.D., CACP

## 2011-10-02 DIAGNOSIS — L03039 Cellulitis of unspecified toe: Secondary | ICD-10-CM | POA: Diagnosis not present

## 2011-10-02 DIAGNOSIS — L02619 Cutaneous abscess of unspecified foot: Secondary | ICD-10-CM | POA: Diagnosis not present

## 2011-10-02 DIAGNOSIS — M25579 Pain in unspecified ankle and joints of unspecified foot: Secondary | ICD-10-CM | POA: Diagnosis not present

## 2011-10-13 ENCOUNTER — Ambulatory Visit (INDEPENDENT_AMBULATORY_CARE_PROVIDER_SITE_OTHER): Payer: Medicare Other | Admitting: Pharmacist

## 2011-10-13 DIAGNOSIS — D6859 Other primary thrombophilia: Secondary | ICD-10-CM

## 2011-10-13 DIAGNOSIS — I82419 Acute embolism and thrombosis of unspecified femoral vein: Secondary | ICD-10-CM

## 2011-10-13 DIAGNOSIS — I824Y9 Acute embolism and thrombosis of unspecified deep veins of unspecified proximal lower extremity: Secondary | ICD-10-CM

## 2011-10-13 LAB — POCT INR: INR: 3.3

## 2011-10-13 NOTE — Patient Instructions (Signed)
Patient instructed to take medications as defined in the Anti-coagulation Track section of this encounter.  Patient instructed to HOLD today's dose and all subsequent dosing until she sees her Podiatrist and has podiatric surgery.  Patient verbalized understanding of these instructions.  AFTER this surgery, patient is to call me to re-establish anticoagulation dosing.

## 2011-10-13 NOTE — Progress Notes (Signed)
Anti-Coagulation Progress Note  [name] is a 71 y.o. female who is currently on an anti-coagulation regimen.    RECENT RESULTS: Recent results are below, the most recent result is correlated with a dose of 42 mg. per week:  We are temporarily interrupting her warfarin for a planned podiatric surgery that she has been wanting to have because of continued and escalating pain on the plantar surface of her foot. 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 She understands and acknowledges her responsibility to do so. Lab Results  Component Value Date   INR 3.30 10/13/2011   INR 3.30 09/22/2011   INR 3.0 09/08/2011    ANTI-COAG DOSE:   Latest dosing instructions   Total Sun Mon Tue Wed Thu Fri Sat   42 6 mg 6 mg 6 mg 6 mg 6 mg 6 mg 6 mg    (6 mg1) (6 mg1) (6 mg1) (6 mg1) (6 mg1) (6 mg1) (6 mg1)         ANTICOAG SUMMARY: Anticoagulation Episode Summary              Current INR goal 2.0-3.0 Next INR check 10/13/2011   INR from last check 3.30! 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 Additonally--now added to problem list is a diagnosis of primarly hypercoagulable state. Given this documentation--will give consideration--with advise and consent of patient--for long term/continued warfarin therapy.            ANTICOAG TODAY: Anticoagulation Summary as of 10/13/2011        INR goal 2.0-3.0     Selected INR 3.30! 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 Additonally--now added to problem list is a diagnosis of primarly hypercoagulable state. Given this documentation--will give consideration--with advise and consent of patient--for long term/continued warfarin therapy.            PATIENT INSTRUCTIONS: Patient Instructions  Patient instructed to take medications as defined in the Anti-coagulation Track section of this encounter.  Patient instructed to HOLD today's dose and all subsequent dosing until she sees her Podiatrist and has podiatric surgery.  Patient verbalized understanding of these instructions.  AFTER this surgery, patient is to call me to re-establish anticoagulation dosing.       FOLLOW-UP Return in 4 weeks (on 11/10/2011) for Follow up INR.  Hulen Luster, III Pharm.D., CACP

## 2011-10-16 ENCOUNTER — Telehealth: Payer: Self-pay | Admitting: Nephrology

## 2011-10-16 NOTE — Telephone Encounter (Signed)
Dr. Allena Katz The advanced home representative came in today and stated that a Face- to- Face Document was signed by Dr. 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

## 2011-10-17 DIAGNOSIS — Q66229 Congenital metatarsus adductus, unspecified foot: Secondary | ICD-10-CM | POA: Diagnosis not present

## 2011-10-24 DIAGNOSIS — T1500XA Foreign body in cornea, unspecified eye, initial encounter: Secondary | ICD-10-CM | POA: Diagnosis not present

## 2011-10-24 DIAGNOSIS — Z947 Corneal transplant status: Secondary | ICD-10-CM | POA: Diagnosis not present

## 2011-10-24 DIAGNOSIS — H259 Unspecified age-related cataract: Secondary | ICD-10-CM | POA: Diagnosis not present

## 2011-11-05 DIAGNOSIS — D485 Neoplasm of uncertain behavior of skin: Secondary | ICD-10-CM | POA: Diagnosis not present

## 2011-11-06 ENCOUNTER — Telehealth: Payer: Self-pay | Admitting: *Deleted

## 2011-11-06 NOTE — Telephone Encounter (Signed)
Returned call to pt and no answer. Message left for her to call clinic. Will try again. So far no notes from office.

## 2011-11-06 NOTE — Telephone Encounter (Signed)
I talked with Dr Alexandria Lodge and he wants her to hold coumadin for now.  He will get in touch with the podiatrist and MD tomorrow. I still have not received office notes.

## 2011-11-06 NOTE — Telephone Encounter (Signed)
Talked with pt and she is taking 2 pills but does not know the name of the meds.  She can not see to read the name to me.   A neighbor was able to read the meds and she is taking norvasc and lisinopril. It does not seem that she is taking Xaralto.

## 2011-11-06 NOTE — Telephone Encounter (Signed)
Pt called stating she went yesterday to set up appointment for surgery on left foot.  She went to Dr Elvin So and states he said he thinks she should go back on coumadin.  He would not do surgery on foot because it's swollen.   Last coumadin 6/3 per Dr Saralyn Pilar note stating to temporarily interrupting her warfarin for a planned podiatric surgery on plantar surface of foot.  Swelling has been there for 2 weeks.  Pain and swelling. Relief when she is not weight bearing. I tried to give her an appointment today but she has no ride until Tuesday of next week. Appointment 10:15 on Tuesday for evaluation.    I called Dr Dorisann Frames office  and they will fax his notes to [place].

## 2011-11-06 NOTE — Telephone Encounter (Signed)
Pt was supposed to be on Xaralto while she was off the Coumadin. Is she taking it? If not, please let me know ASAP. Hopefully Dr Dorisann Frames note will explaining when he does plan on surgery as that will effect whether we cont the Xaralto or resume the coumadin. The important thing is why she is having swelling and unfortunately pt refused appt today. I would encouraged to be seen prior to Tues. Pleas pass on the office notes when they arrive. Will forward to PCP and Dr Alexandria Lodge in case they have any further input.

## 2011-11-06 NOTE — Telephone Encounter (Signed)
On review of her chart, the [name] was from earlier in the year and Dr [name] and her PCP had not intended her to use it while she was off coumadin for her foot surgery. I had not looked closely at the dates on that note that referenced Xaralto.  Dr [name] was very clear in his last note that "AFTER this surgery, patient is to call me to re-establish anticoagulation dosing." Even though pt did not have surgery, she still needs to restart warfarin so I think his instructions still apply. Pls ask her to call Dr [name]. If he cannot be reached, please have her resume her prior dosing.   We still need to determine the cause of her swelling as it must have been sig enough to delay surgery. The sooner she gets in the better. If she has CP, SOB, dizziness, syncope she needs to call 911!  Sorry for the [employer] confusion.

## 2011-11-11 ENCOUNTER — Telehealth: Payer: Self-pay | Admitting: Pharmacist

## 2011-11-11 ENCOUNTER — Ambulatory Visit: Payer: Medicare Other | Admitting: Internal Medicine

## 2011-11-11 NOTE — Telephone Encounter (Signed)
Received call by Inocencio Homes, RN the Triage Nurse regarding this 71 year old woman who has been treated with warfarin for VTE. We treated for a recognized duration of therapy in the literature--and then were faced with the scenario by the patient of "having to have podiatric surgery"...."for such a degree of pain and discomfort .Marland Kitchen..'that I simply can't walk or go on'." Because of this--we readied the patient for her scheduled appointment with her podiatrist by discontinuing her warfarin over a 5 day period leading up to the scheduled appointment with the podiatrist who was then to evaluate her and schedule her for podiatric surgery.  She indicates that upon his exam--that the affected foot was swollen and inflamed and he would not do the surgery.  This was at end of last week. She was scheduled for an appointment TODAY in [facility] for evaluation of her foot to determine what might be causing the swelling and inflammation that the DPM spoke of. She "no-showed" to [facility] citing transportation issues. She has been scheduled for 1045h next Wednesday 10-JUL-13. I spoke with Dr. Rogelia Boga regarding this patient. We are hesitant to recommence warfarin until after seeing the patient/physical exam to determine what the cause may be for her swelling and inflammation. IF the swelling and inflammation might be quickly treated--readying her again for potential of podiatric surgery--we feel that recommencing warfarin would not be in the patient's best interest at this time. Additionally--mitigating against recommencing warfarin without seeing the patient--is her continued demonstration of non-compliance to our instructions regarding RTC visits for required INR checks. I have called the patient and discussed all these issues with her. She was advised to keep her scheduled clinic appointment for next Wednesday at 1045h at which time we will evaluate then--after history and physical exam as well as any required labs and or drug therapies for  swelling/inflammation---we would evaluate THEN--a possibility of recommencing warfarin therapy.  Hulen Luster, PharmD, CACP

## 2011-11-11 NOTE — Telephone Encounter (Signed)
Agree. [name] is attempting to get records from podiatry

## 2011-11-12 ENCOUNTER — Telehealth: Payer: Self-pay | Admitting: Pharmacist

## 2011-11-12 ENCOUNTER — Telehealth: Payer: Self-pay | Admitting: Internal Medicine

## 2011-11-12 NOTE — Telephone Encounter (Signed)
Confusion over pts warfarin and foot surgery. Talked to Dr Dorisann Frames office. Needs hammertoe surgery on same leg as Feb 2013 DVT. Pt had told use she was going to have surgery so she was taken off warfarin. We got a second hand message that surgery was not going to be done. I called office and spoke to employee. Podiatry has own policy not to do any intervention until 1 yr after DVT. I informed that pt has completed 3 months of warfarin and procedure could be safely done but was again told their 1 yr policy. Spoke to Dr Alexandria Lodge who will call pt to resume warfarin.

## 2011-11-12 NOTE — Telephone Encounter (Signed)
Called her on her mobile phone and left message indicating she is to resume warfarin 6mg  PO QD until seen next Wednesday in [facility] at 1045AM. She was given my pager number with instructions on how to reach me with any questions she may have.  Hulen Luster, PharmD CACP

## 2011-11-12 NOTE — Telephone Encounter (Addendum)
I called Dr Dorisann Frames office again and have now received his office notes. They have been given to Dr Rogelia Boga

## 2011-11-15 ENCOUNTER — Telehealth: Payer: Self-pay | Admitting: Pharmacist

## 2011-11-15 NOTE — Telephone Encounter (Signed)
EMS/EMT at the home of this 71 year old AA woman who I see in the anticoagulation management clinic. She had called EMS complaining of foot pain. EMT denies any evidence (objective--or subjective) suggesting bleeding. He indicates the patient is endorsing this as the same pain that she has seen her podiatrist for for which has been diagnosed as "hammer toe". She is scheduled to be seen in [facility] on Wednesday July 10th, 2013. I advised if he needed to discuss the patient any further than the insight I could offer that he may wish to call hospital operator and have paged the admitting intern/resident for the IM Team on-call.  Hulen Luster, PharmD CACP

## 2011-11-17 NOTE — Telephone Encounter (Signed)
Dr. Rogelia Boga, I have been following her office notes from Podiatry and also telephone encounters from you. I think I have an opening on Wednesday for my CC and can see her and talk with Podiatry if needed at that time.

## 2011-11-17 NOTE — Telephone Encounter (Signed)
Dr Allena Katz, You haven't seen pt since Jan but she apparently is having a lot of trouble with foot pain. Podiatry refuses to proceed with surgery 2/2 her DVT even though she has completed 3 months anticoag. If this continues to be an issue and we are unable to provide relief by non-surgical means, you might want to consider calling podiatry and seeing if you can convince them to proceed.

## 2011-11-19 ENCOUNTER — Ambulatory Visit: Payer: Medicare Other | Admitting: Internal Medicine

## 2011-11-19 ENCOUNTER — Encounter: Payer: Self-pay | Admitting: Internal Medicine

## 2011-11-19 ENCOUNTER — Ambulatory Visit (INDEPENDENT_AMBULATORY_CARE_PROVIDER_SITE_OTHER): Payer: Medicare Other | Admitting: Pharmacist

## 2011-11-19 ENCOUNTER — Ambulatory Visit (INDEPENDENT_AMBULATORY_CARE_PROVIDER_SITE_OTHER): Payer: Medicare Other | Admitting: Internal Medicine

## 2011-11-19 VITALS — BP 160/90 | HR 76 | Temp 97.0°F | Ht 64.0 in | Wt 168.0 lb

## 2011-11-19 DIAGNOSIS — I10 Essential (primary) hypertension: Secondary | ICD-10-CM | POA: Diagnosis not present

## 2011-11-19 DIAGNOSIS — I824Y9 Acute embolism and thrombosis of unspecified deep veins of unspecified proximal lower extremity: Secondary | ICD-10-CM

## 2011-11-19 DIAGNOSIS — D6859 Other primary thrombophilia: Secondary | ICD-10-CM | POA: Diagnosis not present

## 2011-11-19 DIAGNOSIS — Z7901 Long term (current) use of anticoagulants: Secondary | ICD-10-CM | POA: Insufficient documentation

## 2011-11-19 DIAGNOSIS — R600 Localized edema: Secondary | ICD-10-CM | POA: Insufficient documentation

## 2011-11-19 DIAGNOSIS — M79609 Pain in unspecified limb: Secondary | ICD-10-CM

## 2011-11-19 DIAGNOSIS — M79672 Pain in left foot: Secondary | ICD-10-CM

## 2011-11-19 DIAGNOSIS — I82419 Acute embolism and thrombosis of unspecified femoral vein: Secondary | ICD-10-CM

## 2011-11-19 LAB — POCT INR: INR: 2.1

## 2011-11-19 MED ORDER — HYDROCODONE-ACETAMINOPHEN 5-325 MG PO TABS: 1.0000 | ORAL_TABLET | Freq: Four times a day (QID) | ORAL | Status: AC | PRN

## 2011-11-19 NOTE — Assessment & Plan Note (Signed)
Patient with bilateral foot pain for few months now- left side getting worse. Podiatrist following. Talk with Dr. Elvin So about option of surgery now with bridging her on Lovenox and then stopping 24 hours before surgery and is starting back 24-48 hours post surgery along with Coumadin- discussed with Dr. Alexandria Lodge about this regimen. But he advised to wait for at least 6 months before getting surgery and meanwhile if possible do pain control. And discussed with patient about this and she agreed to pain control. I will give her Vicodin 5-325 one tablet 2-3 times daily for pain. Patient does not have money to get a prescription filled. Also does not have any family member who can provide her $4 for the prescription.

## 2011-11-19 NOTE — Progress Notes (Signed)
  Subjective:    Patient ID: [name], female    DOB: 03-31-1941, 71 y.o.   MRN: [id number]  HPI [name] is a pleasant 41 year woman with past history of hyperlipidemia, deafness, hypertension, CKD stage 2, recent DVT who comes to the clinic for bilateral foot pain. She was seen by podiatrist Dr. Elvin So recently for foot pain and was supposed to get surgical intervention-but surgery was held as patient had Recent DVT- is being on Coumadin for last 3 months. Patient complains of left foot pain along with ankle pain and right foot pain- which gets worse with activity and walking. Last week patient called EMS at 3:30 AM for foot pain- who happened to call Dr. Chancy Milroy  With pharmacy- as patient remembered him as per Doctor.   seems like patient's pain is getting worse -for which I discussed with Dr. Elvin So on phone today about possible surgery -  and he adjusted to wait at least 6 months from having DVT - before proceeding with surgery.   patient does not have any fever, chills, nausea vomiting, pain, chest pain, short of breath.  Of note: Patient says she does not have $4 to get a prescription filled for pain medication and doesn't have any family member to help her too. Unclear if patient understands situation well.   Review of Systems     is per history of present illness, all other systems reviewed and negative. Objective:   Physical Exam  General: NAD HEENT: PERRL, EOMI, no scleral icterus Cardiac: RRR, no rubs, murmurs or gallops Pulm: clear to auscultation bilaterally, moving normal volumes of air Abd: soft, nontender, nondistended, BS present Ext: Pedal edema on her left foot- with ankle swelling. Tenderness to palpation her anterior shin and ankle. Tender to palpation on plantar aspect of forefoot on left and right. No ankle tenderness on right foot. No calf tenderness bilaterally. Neuro: alert and oriented X3, cranial nerves II-XII grossly intact       Assessment &  Plan:

## 2011-11-19 NOTE — Patient Instructions (Signed)
Please make a follow appointment in 6-8 weeks or earlier if needed. Continue taking Coumadin and all the medications. Take Vicodin as needed for pain or ibuprofen or Tylenol. Dr. Elvin So will do surgery after 6 months on Coumadin- which means you have to wait to 3 months more.

## 2011-11-19 NOTE — Assessment & Plan Note (Signed)
Blood pressure mildly elevated. May be from pain reaction. Recheck during next visit and will consider adding third hypertensive medication to the regimen.

## 2011-11-19 NOTE — Progress Notes (Signed)
Anti-Coagulation Progress Note  [name] is a 71 y.o. female who is currently on an anti-coagulation regimen.    RECENT RESULTS: Recent results are below, the most recent result is correlated with a dose of 42 mg. per week: Lab Results  Component Value Date   INR 2.1 11/19/2011   INR 3.30 10/13/2011   INR 3.30 09/22/2011    ANTI-COAG DOSE:   Latest dosing instructions   Total Sun Mon Tue Wed Thu Fri Sat   48 6 mg 9 mg 6 mg 6 mg 9 mg 6 mg 6 mg    (6 mg1) (6 mg1.5) (6 mg1) (6 mg1) (6 mg1.5) (6 mg1) (6 mg1)         ANTICOAG SUMMARY: Anticoagulation Episode Summary              Current INR goal 2.0-3.0 Next INR check 12/15/2011   INR from last check 2.1 (11/19/2011)     Weekly max dose (mg)  Target end date 07/08/2012   Indications Femoral DVT (deep venous thrombosis), Primary hypercoagulable state, Encounter for long-term (current) use of anticoagulants   [facility] check [facility]    Send INR reminders to    Comments             ANTICOAG TODAY: Anticoagulation Summary as of 11/19/2011              INR goal 2.0-3.0     Selected INR 2.1 (11/19/2011) Next INR check 12/15/2011   Weekly max dose (mg)  Target end date 07/08/2012   Indications Femoral DVT (deep venous thrombosis), Primary hypercoagulable state, Encounter for long-term (current) use of anticoagulants    Anticoagulation Episode Summary              IN[facility] check [facility]    Send INR reminders to    Comments             PATIENT INSTRUCTIONS: Patient Instructions  Patient instructed to take medications as defined in the Anti-coagulation Track section of this encounter.  Patient instructed to take today's dose.  Patient verbalized understanding of these instructions.        FOLLOW-UP Return in 4 weeks (on 12/15/2011) for Follow up INR at 1115h.  Hulen Luster, III Pharm.D., CACP

## 2011-11-19 NOTE — Patient Instructions (Signed)
Patient instructed to take medications as defined in the Anti-coagulation Track section of this encounter.  Patient instructed to take today's dose.  Patient verbalized understanding of these instructions.    

## 2011-11-27 DIAGNOSIS — H52219 Irregular astigmatism, unspecified eye: Secondary | ICD-10-CM | POA: Diagnosis not present

## 2011-11-27 DIAGNOSIS — H5500 Unspecified nystagmus: Secondary | ICD-10-CM | POA: Diagnosis not present

## 2011-11-27 DIAGNOSIS — H3530 Unspecified macular degeneration: Secondary | ICD-10-CM | POA: Diagnosis not present

## 2011-11-27 DIAGNOSIS — H259 Unspecified age-related cataract: Secondary | ICD-10-CM | POA: Diagnosis not present

## 2011-12-11 ENCOUNTER — Encounter: Payer: Medicare Other | Admitting: Internal Medicine

## 2011-12-11 ENCOUNTER — Encounter: Payer: Self-pay | Admitting: *Deleted

## 2011-12-11 DIAGNOSIS — M659 Synovitis and tenosynovitis, unspecified: Secondary | ICD-10-CM | POA: Diagnosis not present

## 2011-12-11 DIAGNOSIS — M65979 Unspecified synovitis and tenosynovitis, unspecified ankle and foot: Secondary | ICD-10-CM | POA: Diagnosis not present

## 2011-12-11 DIAGNOSIS — G5750 Tarsal tunnel syndrome, unspecified lower limb: Secondary | ICD-10-CM | POA: Diagnosis not present

## 2011-12-11 DIAGNOSIS — M779 Enthesopathy, unspecified: Secondary | ICD-10-CM | POA: Diagnosis not present

## 2011-12-11 NOTE — Progress Notes (Unsigned)
Pt presents stating she was at the foot doctor this morning and has 2 problems, her feet are very swollen, she has an open area on the top of the L foot and when she went to the restroom at the foot doctor's office and voided her urine was a deep red color, she is worried that her coumadin is"messing" with her. The next available appt is 1315 today. She denies weakness, dizziness and active bleeding at this time. This was the first time she has noticed blood in urine, never in her stool. She now tells me that her worker brought her and leaves at 1215, could she be seen now? Please advise

## 2011-12-11 NOTE — Progress Notes (Unsigned)
Patient could not wait until 1:15 PM.  She left apparently making an appointment for Tuesday, August 6th as transportation seems to be an issue.

## 2011-12-15 ENCOUNTER — Ambulatory Visit (INDEPENDENT_AMBULATORY_CARE_PROVIDER_SITE_OTHER): Payer: Medicare Other | Admitting: Pharmacist

## 2011-12-15 ENCOUNTER — Encounter: Payer: Self-pay | Admitting: *Deleted

## 2011-12-15 DIAGNOSIS — D6859 Other primary thrombophilia: Secondary | ICD-10-CM

## 2011-12-15 DIAGNOSIS — I824Y9 Acute embolism and thrombosis of unspecified deep veins of unspecified proximal lower extremity: Secondary | ICD-10-CM | POA: Diagnosis not present

## 2011-12-15 DIAGNOSIS — Z7901 Long term (current) use of anticoagulants: Secondary | ICD-10-CM | POA: Diagnosis not present

## 2011-12-15 DIAGNOSIS — I82419 Acute embolism and thrombosis of unspecified femoral vein: Secondary | ICD-10-CM

## 2011-12-15 LAB — POCT INR: INR: 4.2

## 2011-12-15 NOTE — Progress Notes (Unsigned)
Pt presents wanting to talk to me. She presented last week c/o hematuria, when i went to give her an appt she had left, today she does not c/o of hematuria she c/o coumadin causing her feet to be swollen. She has an appt in the am 12/16/2011. She denies shortness of breath, chest pain, N&V. She is ask to keep appt in am and to see dr groce today.

## 2011-12-15 NOTE — Progress Notes (Signed)
Anti-Coagulation Progress Note  [name] is a 71 y.o. female who is currently on an anti-coagulation regimen.    RECENT RESULTS: Recent results are below, the most recent result is correlated with a dose of 48 mg. per week: Lab Results  Component Value Date   INR 4.20 12/15/2011   INR 2.1 11/19/2011   INR 3.30 10/13/2011    ANTI-COAG DOSE:   Latest dosing instructions   Total Sun Mon Tue Wed Thu Fri Sat   42 6 mg 6 mg 6 mg 6 mg 6 mg 6 mg 6 mg    (6 mg1) (6 mg1) (6 mg1) (6 mg1) (6 mg1) (6 mg1) (6 mg1)         ANTICOAG SUMMARY: Anticoagulation Episode Summary              Current INR goal 2.0-3.0 Next INR check 01/05/2012   INR from last check 4.20! (12/15/2011)     Weekly max dose (mg)  Target end date 07/08/2012   Indications Femoral DVT (deep venous thrombosis), Primary hypercoagulable state, Encounter for long-term (current) use of anticoagulants   [facility] location [facility]    Send INR reminders to    Comments             ANTICOAG TODAY: Anticoagulation Summary as of 12/15/2011              INR goal 2.0-3.0     Selected INR 4.20! (12/15/2011) Next INR check 01/05/2012   Weekly max dose (mg)  Target end date 07/08/2012   Indications Femoral DVT (deep venous thrombosis), Primary hypercoagulable state, Encounter for long-term (current) use of anticoagulants    Anticoagulation Episode Summary              INR check location [facility] lab    Send INR reminders to    Comments             PATIENT INSTRUCTIONS: Patient Instructions  Patient instructed to take medications as defined in the Anti-coagulation Track section of this encounter.  Patient instructed to OMIT/HOLD today's dose.  Patient verbalized understanding of these instructions.        FOLLOW-UP Return in 3 weeks (on 01/05/2012) for Follow up INR at 1130h.  Hulen Luster, III Pharm.D., CACP

## 2011-12-15 NOTE — Patient Instructions (Signed)
Patient instructed to take medications as defined in the Anti-coagulation Track section of this encounter.  Patient instructed to OMIT/HOLD today's dose.  Patient verbalized understanding of these instructions.    

## 2011-12-16 ENCOUNTER — Ambulatory Visit (INDEPENDENT_AMBULATORY_CARE_PROVIDER_SITE_OTHER): Payer: Medicare Other | Admitting: Internal Medicine

## 2011-12-16 ENCOUNTER — Encounter: Payer: Self-pay | Admitting: Internal Medicine

## 2011-12-16 VITALS — BP 116/66 | HR 74 | Temp 97.8°F | Wt 174.9 lb

## 2011-12-16 DIAGNOSIS — R600 Localized edema: Secondary | ICD-10-CM

## 2011-12-16 DIAGNOSIS — M79609 Pain in unspecified limb: Secondary | ICD-10-CM | POA: Diagnosis not present

## 2011-12-16 DIAGNOSIS — N182 Chronic kidney disease, stage 2 (mild): Secondary | ICD-10-CM | POA: Diagnosis not present

## 2011-12-16 DIAGNOSIS — R310 Gross hematuria: Secondary | ICD-10-CM | POA: Diagnosis not present

## 2011-12-16 DIAGNOSIS — M79672 Pain in left foot: Secondary | ICD-10-CM

## 2011-12-16 DIAGNOSIS — I10 Essential (primary) hypertension: Secondary | ICD-10-CM | POA: Diagnosis not present

## 2011-12-16 DIAGNOSIS — Z7901 Long term (current) use of anticoagulants: Secondary | ICD-10-CM | POA: Diagnosis not present

## 2011-12-16 DIAGNOSIS — R609 Edema, unspecified: Secondary | ICD-10-CM | POA: Diagnosis not present

## 2011-12-16 LAB — CBC
HCT: 41.6 % (ref 36.0–46.0)
MCH: 31.4 pg (ref 26.0–34.0)
MCV: 94.8 fL (ref 78.0–100.0)
RBC: 4.39 MIL/uL (ref 3.87–5.11)
WBC: 6.8 10*3/uL (ref 4.0–10.5)

## 2011-12-16 MED ORDER — AMLODIPINE BESYLATE 10 MG PO TABS: 5.0000 mg | ORAL_TABLET | Freq: Every day | ORAL | Status: DC

## 2011-12-16 NOTE — Progress Notes (Signed)
  Subjective:   Patient ID: [name] female   DOB: 1940/12/27 71 y.o.   MRN: [id number]  HPI: [name] is a 71 y.o. woman with h/o HTN, corneal transplant (legally blind), and DVT who presents today with   1. Bilateral LE edema: has occurred for the last month, described causing throbbing pain, hurts worse with rebound from palpation, never before and different from when she experienced her DVT.  Edema is worse with walking.  Better with elevation and at night.  Her foot doctor has prescribed vicodin 5-325 for pain - which she has taken once yesterday and once today.  No recent trauma prior to noticing swelling.  No SOB, CP, nausea, vomiting.  She does report her left leg swelling and pain is worse.  She is supposed to undergo bone realignment surgery by podiatry, but has to be 1 year out from DVT before doing so.  2. Blood in Urine: she also reports she noticed blood clots in her urine on 12/11/11.  She had never noticed blood in urine before.  No blood in stool.  No hemoptysis or hemetemesis.  No other s/s of bleeding.  She reports hematuria subsided through the weekend and she no longer sees blood in her urine.    Review of Systems: General: no fevers, chills, changes in weight, changes in appetite Skin: no rash HEENT: no blurry vision, hearing changes, sore throat Pulm: no dyspnea, coughing, wheezing CV: no chest pain, palpitations, shortness of breath Abd: no abdominal pain, nausea/vomiting, diarrhea/constipation GU: no dysuria, polyuria Neuro: no weakness, numbness, or tingling   Objective:  Physical Exam: Filed Vitals:   12/16/11 0852  BP: 116/66  Pulse: 74  Temp: 97.8 F (36.6 C)  TempSrc: Oral  Weight: 174 lb 14.4 oz (79.334 kg)   Constitutional: Vital signs reviewed.  Patient is an obese woman in no acute distress and cooperative with exam.  Eyes: PERRL, EOMI, conjunctivae normal, No scleral icterus.  Neck: no thyromegaly  Cardiovascular: RRR, S1 normal,  S2 normal, no MRG, pulses symmetric and intact bilaterally Pulmonary/Chest: CTAB, no wheezes, rales, or rhonchi Abdominal: Soft. Non-tender, non-distended, bowel sounds are normal, no masses, organomegaly, or guarding present.  GU: no CVA tenderness Musculoskeletal: bilateral lower extremity non pitting edema without tenderness to calves; tender to palpation of bilateral ankles Neurological: A&O x3 Skin: Warm, dry and intact. No rash, cyanosis, or clubbing.  Psychiatric: Normal mood and affect. speech and behavior is normal. Judgment and thought content normal. Cognition and memory are normal.   Assessment & Plan:   Case and care discussed with Dr. Meredith Pel. Patient to return in 1 month for blood pressure follow up and foot swelling follow up.

## 2011-12-16 NOTE — Assessment & Plan Note (Signed)
BP well controlled today (116/66).  Amlodipine dose halved to 5mg  to see if this helps with edema.  Return in 1 month for BP visit.

## 2011-12-16 NOTE — Patient Instructions (Signed)
-  Decrease amlodipine to 5mg  daily  -I will call you with results from your blood work.  -Continue pain management with vicodin, if your symptoms worsen, call the  Clinic.  Please be sure to bring all of your medications with you to every visit.  Should you have any new or worsening symptoms, please be sure to call the clinic at [phone].

## 2011-12-16 NOTE — Assessment & Plan Note (Addendum)
Patient reports this has subsided.  Likely d/t supratherapuetic INR.  Hematuria on coumadin may be unmasking underlying issue. No c/o dysuria.   -CBC to monitor Hb in setting of gross hematuria -UA -Consider repeat UA in a few months to monitor hematuria - may consider urology work up if persists.  ADDENDUM 12/17/11: Results from Urine as follows: Results for [name], [name] (MRN [id number]) as of 12/17/2011 08:52  Ref. Range 12/16/2011 10:08  Color, Urine Latest Range: YELLOW  YELLOW  APPearance Latest Range: CLEAR  CLEAR  Specific Gravity, Urine Latest Range: 1.005-1.030  1.023  pH Latest Range: 5.0-8.0  5.5  Glucose Latest Range: NEG mg/dL NEG  Bilirubin Urine Latest Range: NEG  SMALL (A)  Ketones, ur Latest Range: NEG mg/dL NEG  Protein Latest Range: NEG mg/dL NEG  Urobilinogen, UA Latest Range: 0.0-1.0 mg/dL 1  Nitrite Latest Range: NEG  NEG  Leukocytes, UA Latest Range: NEG  MOD (A)  Hgb urine dipstick Latest Range: NEG  NEG  WBC, UA Latest Range: <3 WBC/hpf 21-50 (A)  RBC / HPF Latest Range: <3 RBC/hpf 0-2  Squamous Epithelial / LPF Latest Range: RARE  NONE SEEN  Bacteria, UA Latest Range: RARE  FEW (A)  Crystals Latest Range: NONE SEEN  NONE SEEN  Casts Latest Range: NONE SEEN  NONE SEEN   Clean catch.  Will add on Urine culture.  Also, Hb stable at 13.8.

## 2011-12-16 NOTE — Assessment & Plan Note (Signed)
Patient has complained of b/l leg pain, which on today's visit seems to be d/t edema.  Unlikely d/t CHF as she is not symptomatic as such, and echo from 2011 revealed EF 60-65%.  INR supratherapuetic yesterday, so not d/t   -CMET to monitor renal function, liver function and nutrition (albumin) -TSH -Decrease Amlodipine to 5mg  daily -Continue pain mgmt with vicodin  There seems to be some confusion regarding her DVT and foot surgery- per patient, she has to wait 1 year from DVT for surgery, and she tells me her DVT was femoral on her right thigh, and her foot surgery is meant to be on her left foot, where she is complaining of worse pain. Per chart review, Dr. Allena Katz has spoken to podiatrist, who has agreed to surgery 6 months out from DVT (which would be in about 3 months)

## 2011-12-17 LAB — COMPREHENSIVE METABOLIC PANEL
ALT: <8 U/L (ref 0–35)
Alkaline Phosphatase: 94 U/L (ref 39–117)
CO2: 28 mEq/L (ref 19–32)
Creat: 1.21 mg/dL — ABNORMAL HIGH (ref 0.50–1.10)
Glucose, Bld: 91 mg/dL (ref 70–99)
Sodium: 140 mEq/L (ref 135–145)
Total Bilirubin: 0.5 mg/dL (ref 0.3–1.2)

## 2011-12-17 LAB — URINALYSIS, ROUTINE W REFLEX MICROSCOPIC
Nitrite: NEGATIVE
Protein, ur: NEGATIVE mg/dL
Specific Gravity, Urine: 1.023 (ref 1.005–1.030)
Urobilinogen, UA: 1 mg/dL (ref 0.0–1.0)

## 2011-12-17 LAB — URINALYSIS, MICROSCOPIC ONLY: Casts: NONE SEEN

## 2011-12-17 LAB — TSH: TSH: 3.002 u[IU]/mL (ref 0.350–4.500)

## 2011-12-17 NOTE — Addendum Note (Signed)
Addended by: Belia Heman on: 12/17/2011 08:58 AM   Modules accepted: Orders

## 2011-12-17 NOTE — Assessment & Plan Note (Signed)
ADDENDUM 12/17/11: Cr mildly elevated (1.21), GFR calculated to be 55.  Continue to monitor.

## 2011-12-19 ENCOUNTER — Telehealth: Payer: Self-pay | Admitting: *Deleted

## 2011-12-19 ENCOUNTER — Telehealth: Payer: Self-pay | Admitting: Internal Medicine

## 2011-12-19 DIAGNOSIS — N390 Urinary tract infection, site not specified: Secondary | ICD-10-CM

## 2011-12-19 MED ORDER — AMOXICILLIN 500 MG PO CAPS: 500.0000 mg | ORAL_CAPSULE | Freq: Two times a day (BID) | ORAL | Status: AC

## 2011-12-19 NOTE — Telephone Encounter (Signed)
Called to discuss lab results with patient.   TSH and liver function wnl.  Albumin appropriate.  Labs suggest renal insufficiency.    Prelim urine culture reveals >100000 enterococcus species; she continues to experience hematuria.  She has not had enterococcus grow in culture in the past (prior cx have been e.coli).    I will treat enterococcus UTI with amoxicillin 500 BID x 5days (Cr clearance = 36, borderline, also, patient on coumadin therapy, so I asked she skip one dose of coumadin on the day she starts therapy, which will most likely be tomorrow, and she should return to clinic on Monday to check INR and adjust coumadin therapy.  She will return Monday morning).  Patient voiced understanding. Prescription sent to [facility].

## 2011-12-19 NOTE — Telephone Encounter (Signed)
Pt calls c/o no one has told her what is wrong with her, she continues to have episodes of bloody urine and swelling of her feet, she would like for the physician to call her today, [phone]

## 2011-12-20 LAB — URINE CULTURE: Colony Count: >=100000

## 2011-12-22 ENCOUNTER — Other Ambulatory Visit: Payer: Self-pay | Admitting: Internal Medicine

## 2011-12-22 ENCOUNTER — Other Ambulatory Visit (INDEPENDENT_AMBULATORY_CARE_PROVIDER_SITE_OTHER): Payer: Medicare Other

## 2011-12-22 DIAGNOSIS — Z7901 Long term (current) use of anticoagulants: Secondary | ICD-10-CM | POA: Diagnosis not present

## 2011-12-22 DIAGNOSIS — I8290 Acute embolism and thrombosis of unspecified vein: Secondary | ICD-10-CM

## 2012-01-05 ENCOUNTER — Ambulatory Visit: Payer: Medicare Other

## 2012-01-05 ENCOUNTER — Ambulatory Visit (INDEPENDENT_AMBULATORY_CARE_PROVIDER_SITE_OTHER): Payer: Medicare Other | Admitting: Pharmacist

## 2012-01-05 ENCOUNTER — Encounter: Payer: Self-pay | Admitting: Internal Medicine

## 2012-01-05 DIAGNOSIS — Z7901 Long term (current) use of anticoagulants: Secondary | ICD-10-CM | POA: Diagnosis not present

## 2012-01-05 DIAGNOSIS — D6859 Other primary thrombophilia: Secondary | ICD-10-CM | POA: Diagnosis not present

## 2012-01-05 DIAGNOSIS — I82419 Acute embolism and thrombosis of unspecified femoral vein: Secondary | ICD-10-CM

## 2012-01-05 DIAGNOSIS — I824Y9 Acute embolism and thrombosis of unspecified deep veins of unspecified proximal lower extremity: Secondary | ICD-10-CM | POA: Diagnosis not present

## 2012-01-05 NOTE — Progress Notes (Signed)
Anti-Coagulation Progress Note  ALAZE GARVERICK is a 71 y.o. female who is currently on an anti-coagulation regimen.    RECENT RESULTS: Recent results are below, the most recent result is correlated with a dose of 42 mg. per week: Lab Results  Component Value Date   INR 3.6 01/05/2012   INR 2.4 12/22/2011   INR 4.20 12/15/2011    ANTI-COAG DOSE:   Latest dosing instructions   Total Sun Mon Tue Wed Thu Fri Sat   36 6 mg 3 mg 6 mg 6 mg 3 mg 6 mg 6 mg    (6 mg1) (6 mg0.5) (6 mg1) (6 mg1) (6 mg0.5) (6 mg1) (6 mg1)         ANTICOAG SUMMARY: Anticoagulation Episode Summary              Current INR goal 2.0-3.0 Next INR check 01/26/2012   INR from last check 3.6! (01/05/2012)     Weekly max dose (mg)  Target end date 07/08/2012   Indications Femoral DVT (deep venous thrombosis), Primary hypercoagulable state, Encounter for long-term (current) use of anticoagulants   [facility] check location [facility]    Send INR reminders to    Comments             ANTICOAG TODAY: Anticoagulation Summary as of 01/05/2012              INR goal 2.0-3.0     Selected INR 3.6! (01/05/2012) Next INR check 01/26/2012   Weekly max dose (mg)  Target end date 07/08/2012   Indications Femoral DVT (deep venous thrombosis), Primary hypercoagulable state, Encounter for long-term (current) use of anticoagulants    Anticoagulation Episode Summary              INR check location [facility]    Send INR reminders to    Comments             PATIENT INSTRUCTIONS: Patient Instructions  Patient instructed to take medications as defined in the Anti-coagulation Track section of this encounter.  Patient instructed to take today's dose.  Patient verbalized understanding of these instructions.       FOLLOW-UP Return in 3 weeks (on 01/26/2012) for Follow up INR at 1130h.  Hulen Luster, III Pharm.D., CACP

## 2012-01-05 NOTE — Patient Instructions (Signed)
Patient instructed to take medications as defined in the Anti-coagulation Track section of this encounter.  Patient instructed to take today's dose.  Patient verbalized understanding of these instructions.    

## 2012-01-05 NOTE — Progress Notes (Signed)
Dr. Groce's note reviewed and I agree with the assessment and plan as outlined. 

## 2012-01-08 ENCOUNTER — Telehealth: Payer: Self-pay | Admitting: *Deleted

## 2012-01-08 NOTE — Telephone Encounter (Signed)
Charlotte at [facility] calls to say that pt asked that office to give her vicodin to replace vicodin that was missing after a visit to coumadin clinic here at [facility] with dr Alexandria Lodge, she states pt said that when she came for her last visit "2 girls were in the room and looked at her medicine, when they gave it back her vicodin bottle was empty." She wants it replaced. Claris Gower was informed that pt would need to file a police report and call the clinic for an appt. She states that [facility] prescribed #30 7.5/325mg  vicodin on 7/31, also on 5/6 #30 5/325 was prescribed, the physician is dr Theodosia Paling. i have called dr Midwife and made her aware of this, i will also send this to jim shaw, [facility]. And to dr Midwife and dr patel. Claris Gower called back and stated she spoke w/ pt and told her she would need to file a police report and pt refused to do so.

## 2012-01-15 ENCOUNTER — Encounter: Payer: Self-pay | Admitting: *Deleted

## 2012-01-19 ENCOUNTER — Ambulatory Visit (INDEPENDENT_AMBULATORY_CARE_PROVIDER_SITE_OTHER): Payer: Medicare Other | Admitting: Internal Medicine

## 2012-01-19 ENCOUNTER — Encounter: Payer: Self-pay | Admitting: Internal Medicine

## 2012-01-19 VITALS — BP 135/71 | HR 60 | Temp 97.5°F | Ht 64.0 in | Wt 172.6 lb

## 2012-01-19 DIAGNOSIS — N179 Acute kidney failure, unspecified: Secondary | ICD-10-CM

## 2012-01-19 DIAGNOSIS — N182 Chronic kidney disease, stage 2 (mild): Secondary | ICD-10-CM | POA: Diagnosis not present

## 2012-01-19 DIAGNOSIS — R609 Edema, unspecified: Secondary | ICD-10-CM | POA: Diagnosis not present

## 2012-01-19 DIAGNOSIS — R600 Localized edema: Secondary | ICD-10-CM

## 2012-01-19 NOTE — Progress Notes (Signed)
Ms. Khamis is a 71 yo woman with PMH of HLP, HTN, right DVT presents today for 1 month follow up on her bilateral leg edema.  She states that the leg edema has been going on for a long time, although she told Dr. shot at that is has been going on for the past one month. Since last office visit, she states that the leg edema improves with elevation. She still wants her elective surgery on her left lower foot for the bony protrusion because it hurts when she walks. No other complaints today. Patient was actually in a hurry and does not want to be seen or get labwork done today.  She denies any increase in shortness of breath.  Review of system: As per history of present illness   Physical examination: General: alert, well-developed, and cooperative to examination.  Neck: supple, full ROM, no thyromegaly, no JVD, and no carotid bruits.  Lungs: normal respiratory effort, no accessory muscle use, normal breath sounds, no crackles, and no wheezes. Heart: normal rate, regular rhythm, no murmur, no gallop, and no rub.  Abdomen: soft, non-tender, normal bowel sounds, no distention, no guarding, no rebound tenderness Msk: no joint swelling, no joint warmth, and no redness over joints.  Pulses: 2+ DP/PT pulses bilaterally Extremities: No cyanosis, clubbing, nonpitting +1-2 edema bilaterally, no erythema . 1 cm bony protrusion on the left plantar with tenderness to palpation but no erythema or fluctuance or discharge.

## 2012-01-19 NOTE — Assessment & Plan Note (Addendum)
DDx include venous insufficiency vs DVT which she is already on Coumadin.  She states that the edema improves with leg elevation. She denies any SOB. -Will continue Coumadin, she has INR appt on 9/16 -Cr was slightly elevated to 1.21 in 12/2011 but she does not want get blood drawn today because she is in a hurry and does not really want to be seen. (she is willing to get it on 9/16 when she comes back for INR check)

## 2012-01-19 NOTE — Assessment & Plan Note (Signed)
Unclear etiology. BMP in August 2013 show a creatinine of 1.2; however patient does not want to get any blood drawn today. I have put in an order for BMP check on patient come back for her INR checked on 01/26/2012. If her creatinine continued to be elevated, we may need to take her off of lisinopril.

## 2012-01-26 ENCOUNTER — Ambulatory Visit (INDEPENDENT_AMBULATORY_CARE_PROVIDER_SITE_OTHER): Payer: Medicare Other | Admitting: Internal Medicine

## 2012-01-26 ENCOUNTER — Encounter: Payer: Self-pay | Admitting: Internal Medicine

## 2012-01-26 ENCOUNTER — Ambulatory Visit (INDEPENDENT_AMBULATORY_CARE_PROVIDER_SITE_OTHER): Payer: Medicare Other | Admitting: Pharmacist

## 2012-01-26 VITALS — BP 122/73 | HR 61 | Temp 97.1°F | Ht 64.0 in | Wt 172.7 lb

## 2012-01-26 DIAGNOSIS — D6859 Other primary thrombophilia: Secondary | ICD-10-CM

## 2012-01-26 DIAGNOSIS — N179 Acute kidney failure, unspecified: Secondary | ICD-10-CM | POA: Diagnosis not present

## 2012-01-26 DIAGNOSIS — I824Y9 Acute embolism and thrombosis of unspecified deep veins of unspecified proximal lower extremity: Secondary | ICD-10-CM | POA: Diagnosis not present

## 2012-01-26 DIAGNOSIS — Z7901 Long term (current) use of anticoagulants: Secondary | ICD-10-CM

## 2012-01-26 DIAGNOSIS — I10 Essential (primary) hypertension: Secondary | ICD-10-CM

## 2012-01-26 DIAGNOSIS — I82419 Acute embolism and thrombosis of unspecified femoral vein: Secondary | ICD-10-CM

## 2012-01-26 DIAGNOSIS — N182 Chronic kidney disease, stage 2 (mild): Secondary | ICD-10-CM

## 2012-01-26 LAB — BASIC METABOLIC PANEL WITH GFR
BUN: 25 mg/dL — ABNORMAL HIGH (ref 6–23)
CO2: 25 mEq/L (ref 19–32)
Calcium: 10 mg/dL (ref 8.4–10.5)
Chloride: 101 mEq/L (ref 96–112)
Glucose, Bld: 96 mg/dL (ref 70–99)
Potassium: 4.1 mEq/L (ref 3.5–5.3)

## 2012-01-26 NOTE — Patient Instructions (Addendum)
1.  Stop Coumadin  2.  Follow up in 2 weeks to check the blood test to check your risk of clots.  3.  Call Dr. Dorisann Frames office about scheduling the surgery for your foot.

## 2012-01-26 NOTE — Progress Notes (Signed)
Subjective:   Patient ID: [name] female   DOB: Feb 15, 1941 71 y.o.   MRN: [id number]  HPI: [name] is a 70 y.o. woman who presents to clinic today for follow up from her last appointment.  She also needs follow up on her chronic medical conditions including hypertension.    She has been following with Dr. Elvin So, her podiatrist, for sometime and has been working on getting surgery done on her feet.  She was originally scheduled for surgery in March but that was canceled because of a recent duplex ultrasound that showed a small, non-occlusive retrovalvular DVT noted in the right common femoral artery.  The patient would very much like to get the surgery done but states that Dr. Elvin So wouldn't do it while she was on coumadin.   [name] has a history of DVT per patient report in the 1970s.  She was maintained on Warfarin until the mid 1980s when she was taken off of it.  She was recently restarted in February 2013 because of the findings on the duplex ultrasound.  She has no family history of clots or sudden death.  She is a current smoker, smoking about 1/2 ppd.    Past Medical History  Diagnosis Date  . Hypertension   . HEARING LOSS   . 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 Arthritis   . DVT (deep venous thrombosis)   . Phlebitis    Current Outpatient Prescriptions  Medication Sig Dispense Refill  . amLODipine (NORVASC) 10 MG tablet Take 0.5 tablets (5 mg total) by mouth daily.  30 tablet  11  . HYDROcodone-acetaminophen (NORCO/VICODIN) 5-325 MG per tablet Take 5-325 mg by mouth Every 4 hours as needed.      Marland Kitchen lisinopril (PRINIVIL,ZESTRIL) 10 MG tablet Take 1 tablet (10 mg total) by mouth daily.  30 tablet  11  . warfarin (COUMADIN) 6 MG tablet Take one tablet by mouth daily and/or as instructed.  30 tablet  11  . DISCONTD: enoxaparin (LOVENOX) 80 MG/0.8ML SOLN injection Inject 0.8 mLs (80 mg total) into the skin every 12 (twelve) hours.  8  mL  0   Family History  Problem Relation Age of Onset  . Cancer Maternal Uncle     colon  . Cancer Maternal Grandmother     liver  . Cancer Mother     liver  . Cancer Brother     Unknown  . Cancer Sister     Unknown   History   Social History  . Marital Status: Divorced    Spouse Name: N/A    Number of Children: N/A  . Years of Education: N/A   Social History Main Topics  . Smoking status: Current Every Day Smoker -- 0.5 packs/day for 20 years    Types: Cigarettes  . Smokeless tobacco: Never Used  . Alcohol Use: No  . Drug Use: No  . Sexually Active: No   Other Topics Concern  . Not on file   Social History Narrative   Patient currently lives at home alone, but does have a home worker who visits her throughout the week to help with things around the house.  Patient states that she still tries to remain active and walks daily.  She is has been divorced for sometime and has one son.  She currently lives off social security.  Patient reports using approximately half a pack of cigarettes daily for 20 years.  She denies any alcohol or illicit drug use.  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     Review of Systems: Constitutional: Denies fever, chills, diaphoresis, appetite change and fatigue.  HEENT: Denies photophobia, eye pain, redness, hearing loss, ear pain, congestion, sore throat, rhinorrhea, sneezing, mouth sores, trouble swallowing, neck pain, neck stiffness and tinnitus.   Respiratory: Denies SOB, DOE, cough, chest tightness,  and wheezing.   Cardiovascular: Denies chest pain, palpitations and leg swelling.  Gastrointestinal: Denies nausea, vomiting, abdominal pain, diarrhea, constipation, blood in stool and abdominal distention.  Genitourinary: Denies dysuria, urgency, frequency, hematuria, flank pain and difficulty urinating.  Musculoskeletal: Positive for gait problem. Denies myalgias, back pain,  joint swelling, arthralgias.  Skin: Denies pallor, rash and wound.  Neurological: Denies dizziness, seizures, syncope, weakness, light-headedness, numbness and headaches.  Hematological: Denies adenopathy. Easy bruising, personal or family bleeding history  Psychiatric/Behavioral: Denies suicidal ideation, mood changes, confusion, nervousness, sleep disturbance and agitation  Objective:  Physical Exam: Filed Vitals:   01/26/12 1105  BP: 122/73  Pulse: 61  Temp: 97.1 F (36.2 C)  TempSrc: Oral  Height: 5\' 4"  (1.626 m)  Weight: 172 lb 11.2 oz (78.336 kg)  SpO2: 98%   Constitutional: Vital signs reviewed.  Patient is a well-developed and well-nourished woman in no acute distress and cooperative with exam. Alert and oriented x3.  Head: Normocephalic and atraumatic Ear: TM normal bilaterally Mouth: no erythema or exudates, MMM Eyes: PERRL, EOMI, conjunctivae normal, No scleral icterus.  Neck: Supple, Trachea midline normal ROM, No JVD, mass, thyromegaly, or carotid bruit present.  Cardiovascular: RRR, S1 normal, S2 normal, no MRG, pulses symmetric and intact bilaterally Pulmonary/Chest: CTAB, no wheezes, rales, or rhonchi Abdominal: Soft. Non-tender, non-distended, bowel sounds are normal, no masses, organomegaly, or guarding present.  GU: no CVA tenderness Musculoskeletal: No joint deformities, erythema, or stiffness, ROM full and no nontender Hematology: no cervical, inginal, or axillary adenopathy.  Neurological: A&O x3, Strength is normal and symmetric bilaterally, cranial nerve II-XII are grossly intact, no focal motor deficit, sensory intact to light touch bilaterally.  Skin: bilateral 1+ pitting edema to the knee.  Warm, dry and intact. No rash, cyanosis, or clubbing.  Psychiatric: Normal mood and affect. speech and behavior is normal. Judgment and thought content normal. Cognition and memory are normal.   Assessment & Plan:

## 2012-01-26 NOTE — Patient Instructions (Signed)
Patient instructed to DISCONTINUE WARFARIN. Patient verbalized understanding of these instructions.

## 2012-01-26 NOTE — Progress Notes (Signed)
Anti-Coagulation Progress Note  RASHAUNA TEP is a 70 y.o. female who is currently on an anti-coagulation regimen.    RECENT RESULTS: Recent results are below, the most recent result is correlated with a dose of 36 mg. per week:  Patient is to DISCONTINUE WARFARIN. Lab Results  Component Value Date   INR 4.3 01/26/2012   INR 3.6 01/05/2012   INR 2.4 12/22/2011    ANTI-COAG DOSE:   Latest dosing instructions   Total Sun Mon Tue Wed Thu Fri Sat   36 6 mg 3 mg 6 mg 6 mg 3 mg 6 mg 6 mg    (6 mg1) (6 mg0.5) (6 mg1) (6 mg1) (6 mg0.5) (6 mg1) (6 mg1)         ANTICOAG SUMMARY: Anticoagulation Episode Summary              Current INR goal 2.0-3.0 Next INR check 01/26/2012   INR from last check 4.3! (01/26/2012)     Weekly max dose (mg)  Target end date 07/08/2012   Indications Femoral DVT (deep venous thrombosis) (Resolved), Primary hypercoagulable state (Resolved), Encounter for long-term (current) use of anticoagulants (Resolved)   INR check location [facility] Preferred lab    Discontinue date 01/26/2012 Discontinue reason Therapy Completed   Send INR reminders to    Comments             ANTICOAG TODAY: Anticoagulation Summary as of 01/26/2012              INR goal 2.0-3.0     Selected INR 4.3! (01/26/2012) Next INR check    Weekly max dose (mg)  Target end date 07/08/2012   Indications Femoral DVT (deep venous thrombosis) (Resolved), Primary hypercoagulable state (Resolved), Encounter for long-term (current) use of anticoagulants (Resolved)    Anticoagulation Episode Summary              INR check location [facility] Preferred lab    Discontinue date 01/26/2012 Discontinue reason Therapy Completed   Send INR reminders to    Comments             PATIENT INSTRUCTIONS: Patient Instructions  Patient instructed to DISCONTINUE WARFARIN. Patient verbalized understanding of these instructions.        FOLLOW-UP Return if symptoms worsen or fail to  improve.  Hulen Luster, III Pharm.D., CACP

## 2012-02-09 ENCOUNTER — Ambulatory Visit: Payer: Medicare Other | Admitting: Internal Medicine

## 2012-02-09 ENCOUNTER — Ambulatory Visit (INDEPENDENT_AMBULATORY_CARE_PROVIDER_SITE_OTHER): Payer: Medicare Other | Admitting: Internal Medicine

## 2012-02-09 DIAGNOSIS — D6859 Other primary thrombophilia: Secondary | ICD-10-CM | POA: Diagnosis not present

## 2012-02-09 NOTE — Progress Notes (Signed)
Patient presented today for her hypercoagulable panel.  This should have been a lab only visit.  Patient was not seen but the order was placed for the panel.  I will have her discuss the results with Dr. Allena Katz at her follow up or on the phone if there is anything to discuss with her.

## 2012-02-09 NOTE — Patient Instructions (Signed)
Patient not seen.

## 2012-02-11 DIAGNOSIS — M659 Synovitis and tenosynovitis, unspecified: Secondary | ICD-10-CM | POA: Diagnosis not present

## 2012-02-11 DIAGNOSIS — M65979 Unspecified synovitis and tenosynovitis, unspecified ankle and foot: Secondary | ICD-10-CM | POA: Diagnosis not present

## 2012-02-11 LAB — HYPERCOAGULABLE PANEL, COMPREHENSIVE
Anticardiolipin IgA: 10 APL U/mL (ref <22)
Anticardiolipin IgG: 13 GPL U/mL (ref <23)
Anticardiolipin IgM: 1 MPL U/mL (ref <11)
Beta-2 Glyco I IgG: 0 G Units (ref <20)
DRVVT: 37.4 secs (ref <45.1)
PTT Lupus Anticoagulant: 50.3 secs — ABNORMAL HIGH (ref 28.0–43.0)
PTTLA 4:1 Mix: 44.2 secs — ABNORMAL HIGH (ref 28.0–43.0)
PTTLA Confirmation: 10.7 secs — ABNORMAL HIGH (ref <8.0)
Protein C Activity: 180 % — ABNORMAL HIGH (ref 75–133)
Protein S Activity: 85 % (ref 69–129)
Protein S Total: 94 % (ref 60–150)

## 2012-02-18 ENCOUNTER — Encounter: Payer: Medicare Other | Admitting: Internal Medicine

## 2012-02-18 DIAGNOSIS — Z01818 Encounter for other preprocedural examination: Secondary | ICD-10-CM | POA: Diagnosis not present

## 2012-02-18 DIAGNOSIS — M722 Plantar fascial fibromatosis: Secondary | ICD-10-CM | POA: Diagnosis not present

## 2012-02-18 DIAGNOSIS — M779 Enthesopathy, unspecified: Secondary | ICD-10-CM | POA: Diagnosis not present

## 2012-02-18 DIAGNOSIS — M25579 Pain in unspecified ankle and joints of unspecified foot: Secondary | ICD-10-CM | POA: Diagnosis not present

## 2012-02-25 DIAGNOSIS — I739 Peripheral vascular disease, unspecified: Secondary | ICD-10-CM | POA: Diagnosis not present

## 2012-02-25 DIAGNOSIS — L608 Other nail disorders: Secondary | ICD-10-CM | POA: Diagnosis not present

## 2012-02-25 DIAGNOSIS — L84 Corns and callosities: Secondary | ICD-10-CM | POA: Diagnosis not present

## 2012-03-02 DIAGNOSIS — I10 Essential (primary) hypertension: Secondary | ICD-10-CM | POA: Diagnosis not present

## 2012-03-02 DIAGNOSIS — M2550 Pain in unspecified joint: Secondary | ICD-10-CM | POA: Diagnosis not present

## 2012-03-02 DIAGNOSIS — M25579 Pain in unspecified ankle and joints of unspecified foot: Secondary | ICD-10-CM | POA: Diagnosis not present

## 2012-03-05 DIAGNOSIS — M25579 Pain in unspecified ankle and joints of unspecified foot: Secondary | ICD-10-CM | POA: Diagnosis not present

## 2012-03-06 ENCOUNTER — Emergency Department (HOSPITAL_COMMUNITY)
Admission: EM | Admit: 2012-03-06 | Discharge: 2012-03-06 | Disposition: A | Discharge status: Home / Self Care | Payer: Medicare Other | Attending: Emergency Medicine | Admitting: Emergency Medicine

## 2012-03-06 ENCOUNTER — Encounter (HOSPITAL_COMMUNITY): Payer: Self-pay | Admitting: Emergency Medicine

## 2012-03-06 DIAGNOSIS — Z79899 Other long term (current) drug therapy: Secondary | ICD-10-CM | POA: Diagnosis not present

## 2012-03-06 DIAGNOSIS — F17200 Nicotine dependence, unspecified, uncomplicated: Secondary | ICD-10-CM | POA: Insufficient documentation

## 2012-03-06 DIAGNOSIS — R609 Edema, unspecified: Secondary | ICD-10-CM | POA: Diagnosis not present

## 2012-03-06 DIAGNOSIS — T46905A Adverse effect of unspecified agents primarily affecting the cardiovascular system, initial encounter: Secondary | ICD-10-CM | POA: Insufficient documentation

## 2012-03-06 DIAGNOSIS — H9190 Unspecified hearing loss, unspecified ear: Secondary | ICD-10-CM | POA: Diagnosis not present

## 2012-03-06 DIAGNOSIS — T50995A Adverse effect of other drugs, medicaments and biological substances, initial encounter: Secondary | ICD-10-CM | POA: Diagnosis not present

## 2012-03-06 DIAGNOSIS — M129 Arthropathy, unspecified: Secondary | ICD-10-CM | POA: Diagnosis not present

## 2012-03-06 DIAGNOSIS — I10 Essential (primary) hypertension: Secondary | ICD-10-CM | POA: Diagnosis not present

## 2012-03-06 DIAGNOSIS — Z8672 Personal history of thrombophlebitis: Secondary | ICD-10-CM | POA: Insufficient documentation

## 2012-03-06 DIAGNOSIS — T783XXA Angioneurotic edema, initial encounter: Secondary | ICD-10-CM | POA: Diagnosis not present

## 2012-03-06 DIAGNOSIS — R229 Localized swelling, mass and lump, unspecified: Secondary | ICD-10-CM | POA: Diagnosis not present

## 2012-03-06 DIAGNOSIS — R110 Nausea: Secondary | ICD-10-CM | POA: Diagnosis not present

## 2012-03-06 DIAGNOSIS — L259 Unspecified contact dermatitis, unspecified cause: Secondary | ICD-10-CM | POA: Diagnosis not present

## 2012-03-06 DIAGNOSIS — T464X5A Adverse effect of angiotensin-converting-enzyme inhibitors, initial encounter: Secondary | ICD-10-CM

## 2012-03-06 MED ORDER — FAMOTIDINE 20 MG PO TABS
20.0000 mg | ORAL_TABLET | Freq: Every day | ORAL | Status: DC
  Administered 2012-03-06: 20 mg via ORAL
  Filled 2012-03-06: qty 1

## 2012-03-06 MED ORDER — DIPHENHYDRAMINE HCL 25 MG PO CAPS
25.0000 mg | ORAL_CAPSULE | Freq: Once | ORAL | Status: AC
  Administered 2012-03-06: 25 mg via ORAL
  Filled 2012-03-06: qty 1

## 2012-03-06 NOTE — ED Notes (Addendum)
Allergic reaction to Lisinopril since this AM.  "She has been taking it a long time."  Symptoms of facial swelling (lips, jaw).  Pt recently was started on pain medicine and it is making her nauseated.  She would like to get her pain medicine changed.

## 2012-03-06 NOTE — ED Provider Notes (Signed)
History     CSN: [id number]  Arrival date & time 03/06/12  1027   First MD Initiated Contact with Patient 03/06/12 1029      Chief Complaint  Patient presents with  . Allergic Reaction    HPI  The patient presents with concerns of lip edema.  She notes the symptoms began in the last half day.  Since onset symptoms been persistent.  She denies any other focal complaints, including dyspnea, throat tightness, chest pain, lightheadedness or syncope. The patient does have ongoing pain issues in her foot, though she notes that this is not currently an issue. The patient has been taking lisinopril for years.   Past Medical History  Diagnosis Date  . Hypertension   . HEARING LOSS   . 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 Arthritis   . DVT (deep venous thrombosis)   . Phlebitis     Past Surgical History  Procedure Date  . Eye surgery   . Shoulder open rotator cuff repair 1973    right shoulder, secondary numbness in R hand  . Foot surgery   . Right wrist     Family History  Problem Relation Age of Onset  . Cancer Maternal Uncle     colon  . Cancer Maternal Grandmother     liver  . Cancer Mother     liver  . Cancer Brother     Unknown  . Cancer Sister     Unknown    History  Substance Use Topics  . Smoking status: Current Every Day Smoker -- 0.5 packs/day for 20 years    Types: Cigarettes  . Smokeless tobacco: Never Used  . Alcohol Use: No    OB History    Grav Para Term Preterm Abortions TAB SAB Ect Mult Living                  Review of Systems  All other systems reviewed and are negative.    Allergies  Codeine and Morphine  Home Medications   Current Outpatient Rx  Name Route Sig Dispense Refill  . AMLODIPINE BESYLATE 10 MG PO TABS Oral Take 5 mg by mouth daily.    Marland Kitchen EMETROL 1.87-1.87-21.5 PO SOLN Oral Take 15-30 mLs by mouth every 15 (fifteen) minutes as needed. For nausea    . LISINOPRIL 10 MG PO TABS Oral Take 10  mg by mouth daily.    . OXYCODONE-ACETAMINOPHEN 7.5-325 MG PO TABS Oral Take 1 tablet by mouth every 4 (four) hours as needed. For pain      BP 142/79  Pulse 90  Temp 97.8 F (36.6 C) (Oral)  Resp 16  SpO2 99%  Physical Exam  Nursing note and vitals reviewed. Constitutional: She is oriented to person, place, and time. She appears well-developed and well-nourished. No distress.  HENT:  Head: Normocephalic and atraumatic. No trismus in the jaw.  Nose: Nose normal.  Mouth/Throat: Uvula is midline, oropharynx is clear and moist and mucous membranes are normal. No uvula swelling.    Eyes: Conjunctivae normal and EOM are normal.  Cardiovascular: Normal rate and regular rhythm.   Pulmonary/Chest: Effort normal and breath sounds normal. No stridor. No respiratory distress.  Abdominal: She exhibits no distension.  Musculoskeletal: She exhibits no edema.  Neurological: She is alert and oriented to person, place, and time. No cranial nerve deficit.  Skin: Skin is warm and dry.  Psychiatric: She has a normal mood and  affect.    ED Course  Procedures (including critical care time)  Labs Reviewed - No data to display No results found.   No diagnosis found.  O2: 99%ra, normal  MDM  This patient, in no distress, now presents with ongoing lip edema.  Notably the patient has a long history of using lisinopril.  On my exam there is symmetric without edema, no oral pharyngeal edema, erythema, lesions.  The patient is not dyspneic, nor hypoxic.  [name] vital signs are stable.  We discussed the etiology of angioedema.  Though evidence is moderate, the patient received antihistamines.  After an ED course without decompensation, the patient was discharged in stable condition.     Gerhard Munch, MD 03/06/12 1204

## 2012-03-18 DIAGNOSIS — M25579 Pain in unspecified ankle and joints of unspecified foot: Secondary | ICD-10-CM | POA: Diagnosis not present

## 2012-04-15 DIAGNOSIS — M25579 Pain in unspecified ankle and joints of unspecified foot: Secondary | ICD-10-CM | POA: Diagnosis not present

## 2012-04-26 DIAGNOSIS — H5010 Unspecified exotropia: Secondary | ICD-10-CM | POA: Diagnosis not present

## 2012-04-26 DIAGNOSIS — H259 Unspecified age-related cataract: Secondary | ICD-10-CM | POA: Diagnosis not present

## 2012-04-26 DIAGNOSIS — H53009 Unspecified amblyopia, unspecified eye: Secondary | ICD-10-CM | POA: Diagnosis not present

## 2012-04-26 DIAGNOSIS — H5500 Unspecified nystagmus: Secondary | ICD-10-CM | POA: Diagnosis not present

## 2012-05-20 DIAGNOSIS — L608 Other nail disorders: Secondary | ICD-10-CM | POA: Diagnosis not present

## 2012-05-20 DIAGNOSIS — I739 Peripheral vascular disease, unspecified: Secondary | ICD-10-CM | POA: Diagnosis not present

## 2012-05-20 DIAGNOSIS — L84 Corns and callosities: Secondary | ICD-10-CM | POA: Diagnosis not present

## 2012-06-01 DIAGNOSIS — H2510 Age-related nuclear cataract, unspecified eye: Secondary | ICD-10-CM | POA: Diagnosis not present

## 2012-06-01 DIAGNOSIS — H269 Unspecified cataract: Secondary | ICD-10-CM | POA: Diagnosis not present

## 2012-06-01 DIAGNOSIS — H25019 Cortical age-related cataract, unspecified eye: Secondary | ICD-10-CM | POA: Diagnosis not present

## 2012-06-01 DIAGNOSIS — H5500 Unspecified nystagmus: Secondary | ICD-10-CM | POA: Diagnosis not present

## 2012-06-01 DIAGNOSIS — H52209 Unspecified astigmatism, unspecified eye: Secondary | ICD-10-CM | POA: Diagnosis not present

## 2012-08-17 ENCOUNTER — Other Ambulatory Visit: Payer: Self-pay | Admitting: Internal Medicine

## 2012-08-19 ENCOUNTER — Ambulatory Visit (INDEPENDENT_AMBULATORY_CARE_PROVIDER_SITE_OTHER): Payer: Medicare Other | Admitting: Internal Medicine

## 2012-08-19 ENCOUNTER — Encounter: Payer: Self-pay | Admitting: Internal Medicine

## 2012-08-19 VITALS — BP 160/101 | HR 90 | Temp 97.6°F | Wt 175.6 lb

## 2012-08-19 DIAGNOSIS — D6859 Other primary thrombophilia: Secondary | ICD-10-CM | POA: Diagnosis not present

## 2012-08-19 DIAGNOSIS — F17200 Nicotine dependence, unspecified, uncomplicated: Secondary | ICD-10-CM

## 2012-08-19 DIAGNOSIS — E875 Hyperkalemia: Secondary | ICD-10-CM | POA: Diagnosis not present

## 2012-08-19 DIAGNOSIS — I10 Essential (primary) hypertension: Secondary | ICD-10-CM | POA: Diagnosis not present

## 2012-08-19 LAB — PHOSPHORUS: Phosphorus: 3.5 mg/dL (ref 2.3–4.6)

## 2012-08-19 LAB — COMPREHENSIVE METABOLIC PANEL
ALT: 6 U/L (ref 0–35)
Albumin: 3.8 g/dL (ref 3.5–5.2)
CO2: 34 mEq/L — ABNORMAL HIGH (ref 19–32)
Glucose, Bld: 97 mg/dL (ref 70–99)
Potassium: 5.6 mEq/L — ABNORMAL HIGH (ref 3.5–5.3)
Sodium: 136 mEq/L (ref 135–145)
Total Bilirubin: 0.4 mg/dL (ref 0.3–1.2)
Total Protein: 7.4 g/dL (ref 6.0–8.3)

## 2012-08-19 MED ORDER — AMLODIPINE BESYLATE 10 MG PO TABS: 10.0000 mg | ORAL_TABLET | Freq: Every day | ORAL | Status: DC

## 2012-08-19 MED ORDER — LISINOPRIL 10 MG PO TABS: 10.0000 mg | ORAL_TABLET | Freq: Every day | ORAL | Status: DC

## 2012-08-19 NOTE — Assessment & Plan Note (Signed)
Pertinent Data: Basic Metabolic Panel:    Component Value Date/Time   NA 137 01/26/2012 1052   K 4.1 01/26/2012 1052   CL 101 01/26/2012 1052   CO2 25 01/26/2012 1052   BUN 25* 01/26/2012 1052   CREATININE 1.24* 01/26/2012 1052   CREATININE 0.95 07/09/2011 0610   GLUCOSE 96 01/26/2012 1052   CALCIUM 10.0 01/26/2012 1052   Plan:      Check CMET and phos today

## 2012-08-19 NOTE — Assessment & Plan Note (Addendum)
Pertinent Data:  Ref. 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 Initial episode was in the 1970's and she remained on warfarin until the 1980's.  Incidentally found to have an asymptomatic right femoral vein DVT in 06/2011 and restarted on coumadin, which was subsequently discontinued 01/2012. She did have a hypercoaguable panel in 01/2012 indicating that the patient is lupus anticoagulant positive (this is when she had been off of the medication x 2 weeks at least). However, in result note, Dr. Tonny Branch wanted her to return in 12 weeks to recheck hypercoagulability lab and reconsider anticoagulation as indicated. The patient did not followup in 12 weeks time.  She is asymptomatic today.  Plan:      Check repeat hypercoagulable panel today.  Will discuss with attendings following results - although if she has had recurrent VTEs, seems she would likely require long-term anticoagulation.   Patient is very reluctant to start anticoagulation again and states she never had any clots, that the physicians were incorrect.  Will also discuss with Dr. Alexandria Lodge.

## 2012-08-19 NOTE — Patient Instructions (Addendum)
General Instructions:  Please follow-up at the clinic in 1  month, at which time we will reevaluate your blood pressure, headaches - OR, please follow-up in the clinic sooner if needed.  There have not been changes in your medications:  STOP Lisinopril - PINK PILL   CONTINUE your amlodipine   You are getting labs today, if they are abnormal I will give you a call.   If you are having worsening of your headache, getting more severe or starting to have vision changes, any new areas of weakness, slurred speech, etc, you should go to the ER immediately.  If symptoms worsen, or new symptoms arise, please call the clinic or go to the ER.  PLEASE BRING ALL OF YOUR MEDICATIONS  IN A BAG TO YOUR NEXT APPOINTMENT   Treatment Goals:  Goals (1 Years of Data) as of 08/19/12         As of Today As of Today 03/06/12 01/26/12 01/19/12     Blood Pressure    . Blood Pressure < 140/90  158/94 169/91 142/79 122/73 135/71     Self Care Goals & Plans:  Self Care Goal 08/19/2012  Manage my medications take my medicines as prescribed; bring my medications to every visit; refill my medications on time     LIFESTYLE TIPS TO HELP WITH YOUR BLOOD PRESSURE CONTROL  WEIGHT REDUCTION:  Strategies: A healthy weight loss program includes:  A calorie restricted diet based on individual calorie needs.   Increased physical activity (exercise).  An exercise program is just as important as the right low-calorie diet.    An unhealthy weight loss program includes:  Fasting.   Fad diets.   Supplements and drugs.  These choices do not succeed in long-term weight control.   Home Care Instructions: To help you make the needed dietary changes:   Exercise and perform physical activity as directed by your caregiver.   Keep a daily record of everything you eat. There are many free websites to help you with this. It may be helpful to measure your foods so you can determine if you are eating the correct portion  sizes.   Use low-calorie cookbooks or take special cooking classes.   Avoid alcohol. Drink more water and drinks with no calories.   Take vitamins and supplements only as recommended by your caregiver.   Weight loss support groups, Registered Dieticians, counselors, and stress reduction education can also be very helpful.   ________________________________________________________________________  DASH DIET:  The DASH diet stands for "Dietary Approaches to Stop Hypertension." It is a healthy eating plan that has been shown to reduce high blood pressure (hypertension) in as little as 14 days, while also possibly providing other significant health benefits. These other health benefits include reducing the risk of breast cancer after menopause and reducing the risk of type 2 diabetes, heart disease, colon cancer, and stroke. Health benefits also include weight loss and slowing kidney failure in patients with chronic kidney disease.   Diet guidelines: Limit salt (sodium). Your diet should contain less than 1500 mg of sodium daily.  Limit refined or processed carbohydrates. Your diet should include mostly whole grains. Desserts and added sugars should be used sparingly.  Include small amounts of heart-healthy fats. These types of fats include nuts, oils, and tub margarine. Limit saturated and trans fats. These fats have been shown to be harmful in the body.   Choosing Foods: The following food groups are based on a 2000 calorie diet. See your Registered  Dietitian for individual calorie needs.  Grains and Grain Products (6 to 8 servings daily)  Eat More Often: Whole-wheat bread, brown rice, whole-grain or wheat pasta, quinoa, popcorn without added fat or salt (air popped).  Eat Less Often: White bread, white pasta, white rice, cornbread.  Vegetables (4 to 5 servings daily)  Eat More Often: Fresh, frozen, and canned vegetables. Vegetables may be raw, steamed, roasted, or grilled with a minimal  amount of fat.  Eat Less Often/Avoid: Creamed or fried vegetables. Vegetables in a cheese sauce.  Fruit (4 to 5 servings daily)  Eat More Often: All fresh, canned (in natural juice), or frozen fruits. Dried fruits without added sugar. One hundred percent fruit juice ( cup [237 mL] daily).  Eat Less Often: Dried fruits with added sugar. Canned fruit in light or heavy syrup.  [employer], Fish, and Poultry (2 servings or less daily. One serving is 3 to 4 oz [85-114 g]).  Eat More Often: Ninety percent or leaner ground beef, tenderloin, sirloin. Round cuts of beef, chicken breast, [place] breast. All fish. Grill, bake, or broil your meat. Nothing should be fried.  Eat Less Often/Avoid: Fatty cuts of meat, [place], or chicken leg, thigh, or wing. Fried cuts of meat or fish.  Dairy (2 to 3 servings)  Eat More Often: Low-fat or fat-free milk, low-fat plain or light yogurt, reduced-fat or part-skim cheese.  Eat Less Often/Avoid: Milk (whole, 2%, skim, or chocolate). Whole milk yogurt. Full-fat cheeses.  Nuts, Seeds, and Legumes (4 to 5 servings per week)  Eat More Often: All without added salt.  Eat Less Often/Avoid: Salted nuts and seeds, canned beans with added salt.  Fats and Sweets (limited)  Eat More Often: Vegetable oils, tub margarines without trans fats, sugar-free gelatin. Mayonnaise and salad dressings.  Eat Less Often/Avoid: Coconut oils, palm oils, butter, stick margarine, cream, half and half, cookies, candy, pie.   ________________________________________________________________________  Smoking Cessation Tips 1-800-QUIT-NOW  This document explains the best ways for you to quit smoking and new treatments to help. It lists new medicines that can double or triple your chances of quitting and quitting for good. It also considers ways to avoid relapses and concerns you may have about quitting, including weight gain.   Nicotine: A Powerful Addiction If you have tried to quit smoking, you  know how hard it can be. It is hard because nicotine is a very addictive drug. For some people, it can be as addictive as heroin or cocaine. Usually, people make 2 or 3 tries, or more, before finally being able to quit. Each time you try to quit, you can learn about what helps and what hurts. Quitting takes hard work and a lot of effort, but you can quit smoking.   Quitting smoking is one of the most important things you will ever do You will live longer, feel better, and live better.  The impact on your body of quitting smoking is felt almost immediately:   Five keys to quitting: Studies have shown that these 5 steps will help you quit smoking and quit for good. You have the best chances of quitting if you use them together:   1. GET READY  Set a quit date.  Change your environment.  Get rid of ALL cigarettes, ashtrays, matches, and lighters in your home, car, and place of work.  Do not let people smoke in your home.  Review your past attempts to quit. Think about what worked and what did not.  Once  you quit, do not smoke. NOT EVEN A PUFF!   2. GET SUPPORT AND ENCOURAGEMENT  Tell your family, friends, and coworkers that you are going to quit and need their support. Ask them not to smoke around you.  Get individual, group, or telephone counseling and support.  Many smokers find one or more of the many self-help books available useful in helping them quit and stay off tobacco.   3. LEARN NEW SKILLS AND BEHAVIORS  Try to distract yourself from urges to smoke. Talk to someone, go for a walk, or occupy your time with a task.  When you first try to quit, change your routine. Take a different route to work. Drink tea instead of coffee. Eat breakfast in a different place.  Do something to reduce your stress. Take a hot bath, exercise, or read a book.  Plan something enjoyable to do every day. Reward yourself for not smoking.  Explore interactive web-based programs that specialize in helping you  quit.   4. GET MEDICINE AND USE IT CORRECTLY .  Medicines can help you stop smoking and decrease the urge to smoke. Combining medicine with the above behavioral methods and support can quadruple your chances of successfully quitting smoking.  Talk with your doctor about these options.  5. BE PREPARED FOR RELAPSE OR DIFFICULT SITUATIONS  Most relapses occur within the first 3 months after quitting. Do not be discouraged if you start smoking again. Remember, most people try several times before they finally quit.  You may have symptoms of withdrawal because your body is used to nicotine. You may crave cigarettes, be irritable, feel very hungry, cough often, get headaches, or have difficulty concentrating.  The withdrawal symptoms are only temporary. They are strongest when you first quit, but they will go away within 10 to 14 days.   Quitting takes hard work and a lot of effort, but you can quit smoking.   FOR MORE INFORMATION  Smokefree.gov (http://www.davis-sullivan.com/) provides free, accurate, evidence-based information and professional assistance to help support the immediate and long-term needs of people trying to quit smoking.  Document Released: 04/22/2001 Document Re-Released: 10/16/2009  [facility] Patient Information 2011 [place], [place].

## 2012-08-19 NOTE — Assessment & Plan Note (Signed)
  Assessment: 1. The patient was counseled on the dangers of tobacco use, which include, but are not limited to cardiovascular disease, increased cancer risk of multiple types of cancer, COPD, peripheral vascular disease, strokes. 2. She was also counseled on the benefits of smoking cessation. 3. Progress toward smoking cessation:   Smoking less 4. Barriers to progress toward smoking cessation:     Plan: 1. Instruction/counseling given: The patient was firmly advised to quit and referred to a tobacco cessation program.   2. We also reviewed strategies to maximize success, including:  Removing cigarettes and smoking materials from environment  Stress management  Substitution of other forms of reinforcement Support of family/friends.  Selecting a quit date. 3. Educational resources provided:   4. Self management tools provided:  Handout 5. Medications to assist with smoking cessation: None 6. Patient agreed to the following self-care plans for smoking cessation:  decrease number she of daily cigarettes.

## 2012-08-19 NOTE — Assessment & Plan Note (Addendum)
Pertinent Data: BP Readings from Last 3 Encounters:  08/19/12 160/101  03/06/12 142/79  01/26/12 122/73    Basic Metabolic Panel:    Component Value Date/Time   NA 137 01/26/2012 1052   K 4.1 01/26/2012 1052   CL 101 01/26/2012 1052   CO2 25 01/26/2012 1052   BUN 25* 01/26/2012 1052   CREATININE 1.24* 01/26/2012 1052   CREATININE 0.95 07/09/2011 0610   GLUCOSE 96 01/26/2012 1052   CALCIUM 10.0 01/26/2012 1052    Assessment: Disease Control:  Uncontrolled  Progress toward goals:  Deteriorated  Barriers to meeting goals: has been out of medication x 3 days    Has been out of her amlodipine and lisinopril for the last 3 days.    Patient is compliant most of the time with prescribed medications.   Plan:  continue current medications  Refill given for her amlodipine and lisinopril --> LISINOPRIL STOPPED AFTER RESULTS OBTAINED SHOWING HYPERKALEMIA - PT AND PHARMACY CALLED AND INFORMED  Check CMET today  ADDENDUM TO PLAN AFTER LABS RESULTED: 08/19/2012, 12:34 PM  Pertinent Labs: Comprehensive Metabolic Panel: Component Value Date/Time   NA 136 08/19/2012 1031   K 5.6* 08/19/2012 1031   CL 97 08/19/2012 1031   CO2 34* 08/19/2012 1031   BUN 15 08/19/2012 1031   CREATININE 1.36* 08/19/2012 1031     Assessment: Patient is noted to have mild hyperkalemia. Likely in the setting of progressive renal dysfunction with ongoing ACE inhibitor usage. During our visit together, I did refill her ACE inhibitor - she has been on this medication it seems like since December 2012. However, in the setting of hyperkalemia I will discontinue the lisinopril. I did first call the lab who confirmed that this was not a hemolyzed specimen.  As the patient is vision impaired, she only knows medications by colors. Therefore, I called her pharmacy and the pharmacist confirmed that the lisinopril is pink football shaped and amlodipine is white and circular. Therefore, I called the patient and informed her to stop  her lisinopril (pink football shaped medication) and to put the pill bottle away so that there is no confusion about if she should or should not take it. The patient was able to repeat back to me that she should stop this medication. She ensured me that she would put the pill bottle away to decrease confusion about it.  At this time, the patient is not experiencing symptoms such as chest pain, palpitations, shortness of breath, muscle aches or pains.  Plan:  Discontinue lisinopril secondary to hyperkalemia.  Will bring the patient back on Monday for repeat labs.  Advised to drink a lot of water in the interim.  The patient was informed of the red flag symptoms that should prompt her immediate return for reevaluation - such as, but not limited to the following: Chest pain, palpitations, muscle aches or pains or weaknesses. She verbally expresses understanding of the information provided.

## 2012-08-19 NOTE — Addendum Note (Signed)
Addended by: Priscella Mann on: 08/19/2012 10:18 PM   Modules accepted: Orders

## 2012-08-19 NOTE — Progress Notes (Signed)
Quick Note:  Likely in setting of renal dysfunction. Asked to hold Lisinopril. Will bring back for repeat labs. ______

## 2012-08-19 NOTE — Progress Notes (Signed)
Patient: [name]   MRN: [id number]  DOB: [birth year]  PCP: Sunday Spillers, MD   Subjective:    HPI: [name] is a 72 y.o. female with a PMHx as outlined below, who presented to clinic today for the following:  1) HTN - Patient does not check blood pressure regularly at home. Currently supposed to be taking Amlodipine 10mg  and Lisinopril 10mg  daily - has been out for last 3 days because did not have any additional refills. Patient misses doses 0 x per week on average. admits to headaches. Denies dizziness, lightheadedness, chest pain, shortness of breath.  does request refills today.   2) Headache - Patient describes a 3 day history of unchanged, constant,  throbbing pain. Patient otherwise does not have regular headache. She does occasionally have these when she has been off of her BP meds. Taking Tylenol does help with the pain.  Factors that precipitated current episode include: being off of her BP meds/ Aggravating factors: heavy activity - but no light or sound sensitivity, alleviating factors: acetaminophen. denies recent trauma, MVA, falls. Admits to associated none. Denies associated none, ataxia, nausea, photophobia, phonophobia, visual changes, or vomiting.    Review of Systems: Per HPI.   Current Outpatient Medications: Medication Sig  . moxifloxacin (VIGAMOX) 0.5 % ophthalmic solution Place 1 drop into the right eye 4 (four) times daily. Starting 2 days prior to surgery  . amLODipine (NORVASC) 10 MG tablet Take 1 tablet (10 mg total) by mouth daily.  . Lisinopril 10 MG tablet Take 1 tablet (10 mg total) by mouth daily.    Allergies  Allergen Reactions  . Codeine     REACTION: Pt c/o "feeling high"  . Morphine     REACTION: feels funny and "high"    Past Medical History  Diagnosis Date  . Hypertension   . HEARING LOSS   . 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 Arthritis   . DVT (deep venous thrombosis)   . Phlebitis       Objective:    Physical Exam: Filed Vitals:   08/19/12 0937  BP: 169/91  Pulse: 80  Temp: 97.6 F (36.4 C)    Negative orthostatic vital signs  General Exam:   General: Vital signs reviewed and noted. Well-developed, well-nourished, in no acute distress; alert, appropriate and cooperative throughout examination.  Head: Normocephalic, atraumatic.  Eyes: No signs of anemia or jaundince.  Nose: Mucous membranes moist, not inflammed, nonerythematous.  Throat: Oropharynx nonerythematous, no exudate appreciated.   Neck: No deformities, masses, or tenderness noted. Supple, No carotid Bruits, no JVD.  Lungs:  Normal respiratory effort. Clear to auscultation BL without crackles or wheezes.  Heart: RRR. S1 and S2 normal without gallop, murmur, or rubs.  Abdomen:  BS normoactive. Soft, Nondistended, non-tender.  No masses or organomegaly.  Extremities: No pretibial edema.  Skin: No visible rashes, scars.    Neurologic Exam:   Mental Status: Alert, oriented, thought content appropriate.  Speech fluent without evidence of aphasia. Able to follow 3 step commands without difficulty.  Cranial Nerves:   II: Visual fields grossly intact.  III/IV/VI: Extraocular movements intact.  Pupils reactive bilaterally.  V/VII: Smile symmetric. facial light touch sensation normal bilaterally.  VIII: Grossly intact.  IX/X: Normal gag.  XI: Bilateral shoulder shrug normal.  XII: Midline tongue extension normal.  Motor:  5/5 bilaterally with normal tone and bulk  Sensory:  Light touch intact throughout,  bilaterally  Cerebellar: Normal rapid alternating movements.    Assessment/ Plan:   [name] case and plan of care w as discussed with attending physician, Dr. Aletta Edouard.

## 2012-08-23 LAB — HYPERCOAGULABLE PANEL, COMPREHENSIVE
AntiThromb III Func: 123 % (ref 76–126)
Anticardiolipin IgG: 19 GPL U/mL (ref <23)
Beta-2 Glyco I IgG: 0 G Units (ref <20)
Beta-2-Glycoprotein I IgA: 0 A Units (ref <20)
Beta-2-Glycoprotein I IgM: 1 M Units (ref <20)
Lupus Anticoagulant: NOT DETECTED
Protein C, Total: 77 % (ref 72–160)
Protein S Total: 84 % (ref 60–150)

## 2012-08-23 NOTE — Progress Notes (Signed)
I have discussed this case with Dr. Reynolds , read the documentation and I agree with the plan of care. Please see the resident note for details of management.  

## 2012-08-24 ENCOUNTER — Other Ambulatory Visit (INDEPENDENT_AMBULATORY_CARE_PROVIDER_SITE_OTHER): Payer: Medicare Other

## 2012-08-24 DIAGNOSIS — F17200 Nicotine dependence, unspecified, uncomplicated: Secondary | ICD-10-CM | POA: Diagnosis not present

## 2012-08-24 DIAGNOSIS — E875 Hyperkalemia: Secondary | ICD-10-CM | POA: Diagnosis not present

## 2012-08-24 DIAGNOSIS — I10 Essential (primary) hypertension: Secondary | ICD-10-CM

## 2012-08-24 DIAGNOSIS — D6859 Other primary thrombophilia: Secondary | ICD-10-CM | POA: Diagnosis not present

## 2012-08-24 LAB — BASIC METABOLIC PANEL
CO2: 31 mEq/L (ref 19–32)
Calcium: 10.3 mg/dL (ref 8.4–10.5)
Creat: 1.35 mg/dL — ABNORMAL HIGH (ref 0.50–1.10)
Glucose, Bld: 97 mg/dL (ref 70–99)

## 2012-08-24 NOTE — Progress Notes (Signed)
Quick Note:  Hyperkalemia resolved - she is continuing to hold her Lisinopril at this time. She will need close followup and likely may need alternative BP med. Will ask to come in next week. ______

## 2012-08-26 DIAGNOSIS — L84 Corns and callosities: Secondary | ICD-10-CM | POA: Diagnosis not present

## 2012-08-26 DIAGNOSIS — I739 Peripheral vascular disease, unspecified: Secondary | ICD-10-CM | POA: Diagnosis not present

## 2012-08-26 DIAGNOSIS — L608 Other nail disorders: Secondary | ICD-10-CM | POA: Diagnosis not present

## 2012-09-01 ENCOUNTER — Ambulatory Visit (INDEPENDENT_AMBULATORY_CARE_PROVIDER_SITE_OTHER): Payer: Medicare Other | Admitting: Internal Medicine

## 2012-09-01 VITALS — BP 132/76 | HR 81 | Temp 97.3°F | Ht 64.0 in | Wt 176.0 lb

## 2012-09-01 DIAGNOSIS — I10 Essential (primary) hypertension: Secondary | ICD-10-CM | POA: Diagnosis not present

## 2012-09-01 DIAGNOSIS — M25511 Pain in right shoulder: Secondary | ICD-10-CM

## 2012-09-01 DIAGNOSIS — D6859 Other primary thrombophilia: Secondary | ICD-10-CM

## 2012-09-01 DIAGNOSIS — R609 Edema, unspecified: Secondary | ICD-10-CM | POA: Diagnosis not present

## 2012-09-01 DIAGNOSIS — E875 Hyperkalemia: Secondary | ICD-10-CM | POA: Diagnosis not present

## 2012-09-01 DIAGNOSIS — M25519 Pain in unspecified shoulder: Secondary | ICD-10-CM | POA: Diagnosis not present

## 2012-09-01 DIAGNOSIS — M25512 Pain in left shoulder: Secondary | ICD-10-CM | POA: Insufficient documentation

## 2012-09-01 DIAGNOSIS — R600 Localized edema: Secondary | ICD-10-CM

## 2012-09-01 NOTE — Assessment & Plan Note (Signed)
Pertinent Data: BP Readings from Last 3 Encounters:  09/01/12 132/76  08/19/12 160/101  03/06/12 142/79    Basic Metabolic Panel:    Component Value Date/Time   NA 137 08/24/2012 0931   K 4.8 08/24/2012 0931   CL 99 08/24/2012 0931   CO2 31 08/24/2012 0931   BUN 15 08/24/2012 0931   CREATININE 1.35* 08/24/2012 0931   CREATININE 0.95 07/09/2011 0610   GLUCOSE 97 08/24/2012 0931   CALCIUM 10.3 08/24/2012 0931    Assessment: Disease Control: controlled  Progress toward goals: at goal  Barriers to meeting goals: no barriers identified    She was stopped of her lisinopril 10 mg at her last visit earlier this month in the setting of hyperkalemia.  Repeat labs indicated that hyperkalemia resolved.  The patient is hesitant to resume even low-dose ACE inhibitor with fear that it could worsen her kidneys.  However, I explained, that we may in the future consider to resume very low dose ACE inhibitor for renal protection in the setting of her CKD3.  I will defer this decision to her PCP who will see her next month.  Patient is compliant most of the time with prescribed medications.   Plan:  continue current medications  Can possibly consider to resume low-dose ACE inhibitor in the future for renal protection in the setting of her CKD3  Educational resources provided:    Self management tools provided:

## 2012-09-01 NOTE — Progress Notes (Signed)
Patient: [name]   MRN: [id number]  DOB: Oct 25, 1940  PCP: Dana Spillers, MD   Subjective:    CC: Follow-up and Arm Pain   HPI: [name] is a 72 y.o. female with a PMHx as outlined below, who presented to clinic today for the following:  1) HTN - Patient does check blood pressure regularly at home. Currently taking Amlodipine 5mg  daily. She was previously on Lisinopril 10mg  additionally, however, her BMET relieved progressive renal decline and elevated potassium - therefore, she was discontinued of her Lisinopril and recheck BMET 1 week later showed normal K . Patient misses doses 0 x per week on average. denies headaches, dizziness, lightheadedness, chest pain, shortness of breath.  does not request refills today.   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 Pt indicates that during her last visit in September 2013, the patient was informed that she never had a DVT from this February 2013 study. Coumadin was discontinued at that point (also with plan to hold to allow for foot and cataract surgery). Therefore, the patient is under the impression that God healed her and she no longer needs Coumadin.  Today, the patient denies any chest pain, shortness of breath, dizziness, lightheadedness, lower extremity pain, swelling. She indicates that she would never want to be on Coumadin again because "they were never able to get it right" and her levels fluctuated significantly". She did not have known bleeding while on therapeutic  3) Bilateral shoulder pain - Patient describes a > 1 year history of stable, intermittent, bilateral shoulder pain. That can increase to a 10/10 in  severity. She takes OTC tylenol for the pain. Pain is worse with activity she denies inciting injury. Prior history of related problems: no prior problems with this area in the past, previous right rotator cuff repair in 1973 for right hand numbness. Admits to associated none. Denies associated upper arm paresthesias, pain radiating to arm/hand, weakness.   Review of Systems: Per HPI.    Current Outpatient Medications: Medication Sig  . amLODipine (NORVASC) 10 MG tablet Take 1 tablet (10 mg total) by mouth daily.  Marland Kitchen moxifloxacin (VIGAMOX) 0.5 % ophthalmic solution Place 1 drop into the right eye 4 (four) times daily. Starting 2 days prior to surgery    Allergies  Allergen Reactions  . Codeine     REACTION: Pt c/o "feeling high"  . Morphine     REACTION: feels funny and "high"    Past Medical History  Diagnosis Date  . Hypertension   . HEARING LOSS   . 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 Arthritis   . DVT (deep venous thrombosis)   . Phlebitis   . CKD (chronic kidney disease) stage 3, GFR 30-59 ml/min     Objective:    Physical Exam: Filed Vitals:   09/01/12 1023  BP: 132/76  Pulse: 81  Temp: 97.3 F (36.3 C)    General: Vital signs reviewed and noted. Well-developed, well-nourished, in no acute distress; alert, appropriate and cooperative throughout examination.  Head: Normocephalic, atraumatic.  Eyes: No signs of anemia or jaundince.  Nose: Mucous  membranes moist, not inflammed, nonerythematous.  Lungs:  Normal respiratory effort. Clear to auscultation BL without crackles or wheezes.  Heart: RRR. S1 and S2 normal without gallop, murmur, or rubs.  Abdomen:  BS normoactive. Soft, Nondistended, non-tender.  No masses or organomegaly.  Extremities: Bilateral trace pretibial edema with left > right. Right shoulder - joint line tenderness to palpation, specific TTP over [facility] joint tenderness of joint with flexion beyond 90 degrees. No swelling,  warmth, erythema of joint. Left shoulder - joint line tenderness to palpation, specific TTP over [facility] joint tenderness of joint with flexion beyond 90 degrees. No swelling, warmth, erythema of joint.    Assessment/ Plan:   [name] case and plan of care was discussed with attending physician, Dr. Debe Coder.

## 2012-09-01 NOTE — Assessment & Plan Note (Addendum)
Pertinent Data Reviewed: Left Shoulder XR (04/2007) - No acute fracture or dislocation about the left shoulder. Degenerative changes of the left acromioclavicular joint. Original report by: Dana Bonine, MD  Assessment: Likely secondary to known degenerative changes, however given gradual worsening of symptoms, will obtain XR imaging today. No appreciable neurovascular compromise, radicular symptoms today.  Plan:      XR BL shoulder.  Continue PRN tylenol, which is helpful.   ADDENDUM TO PLAN AFTER DATA RESULTED: 09/07/2012, 11:52 AM  Pertinent Data Reviewed:  Right Shoulder XR (09/02/2012) - No fracture or dislocation is noted. Mild degenerative changes seen involving the right acromioclavicular joint which may be resulting in inferior spurring and possible impingement of the supraspinatus tendon. Original Report Authenticated By: [name]., M.D.  Left Shoulder XR (09/02/2012) - Osseous demineralization with AC joint degenerative changes. No acute abnormalities. Original Report Authenticated By: [name], M.D.  Assessment: Patient called and informed of the above mentioned results, specifically that there was no fracture noted, arthritic changes are seen. She was offered PT or sports medicine referral, but declined at this time. She wants to discuss it with her PCP at her upcoming visit.  Plan:  Consider PT versus sports medicine referral for continued help with this issue, consideration for ultrasound therapy versus possible steroid injection versus ongoing rehab.

## 2012-09-01 NOTE — Assessment & Plan Note (Addendum)
Pertinent Data Reviewed: Basic Metabolic Panel:    Component Value Date/Time   NA 137 08/24/2012 0931   K 4.8 08/24/2012 0931   CL 99 08/24/2012 0931   CO2 31 08/24/2012 0931   BUN 15 08/24/2012 0931   CREATININE 1.35* 08/24/2012 0931   CREATININE 0.95 07/09/2011 0610   GLUCOSE 97 08/24/2012 0931   CALCIUM 10.3 08/24/2012 0931    Assessment: Resolved. Possibly in setting of progressive kidney disease and ongoing ACE-I usage.  Plan:      Continue hold ACE-I for now.   Consider trial to restart low dose at subsequent visits, in light of her CKD.

## 2012-09-01 NOTE — Patient Instructions (Signed)
General Instructions:  Please follow-up at the clinic in 3 weeks with your PCP, at which time we will reevaluate your shoulder pain, blood pressure - OR, please follow-up in the clinic sooner if needed.  There have not been changes in your medications.   Get your shoulder xrays performed, please. I will call you if results are abnormal.  If symptoms worsen, or new symptoms arise, please call the clinic or go to the ER.  PLEASE BRING ALL OF YOUR MEDICATIONS  IN A BAG TO YOUR NEXT APPOINTMENT   Treatment Goals:  Goals (1 Years of Data) as of 09/01/12         As of Today 08/19/12 08/19/12 08/19/12 08/19/12     Blood Pressure    . Blood Pressure < 140/90  132/76 160/101 158/88 158/94 169/91      Progress Toward Treatment Goals:    Self Care Goals & Plans:  Self Care Goal 08/19/2012  Manage my medications take my medicines as prescribed; bring my medications to every visit; refill my medications on time

## 2012-09-01 NOTE — Assessment & Plan Note (Signed)
Pertinent Data Reviewed:  Ref. Range 02/09/2012  08/19/2012   Anticardiolipin IgA Latest Range: <22 APL U/mL 10 4  Anticardiolipin IgG Latest Range: <23 GPL U/mL 13 19  Anticardiolipin IgM Latest Range: <11 MPL U/mL 1 1  PTT Lupus Anticoagulant Latest Range: 28.0-43.0 secs 50.3 (H) 45.0 (H)  PTTLA Confirmation Latest Range: <8.0 secs 10.7 (H) NOT APPL  PTTLA 4:1 Mix Latest Range: 28.0-43.0 secs 44.2 (H) 42.8  DRVVT Latest Range: <42.9 secs 37.4 35.1  Drvvt confirmation Latest Range: <1.11 Ratio NOT APPL NOT APPL  Lupus Anticoagulant Latest Range: NOT DETECTED  DETECTED (A) NOT DETECTED  Beta-2 Glyco I IgG Latest Range: <20 G Units 0 0  Beta-2-Glycoprotein I IgA Latest Range: <20 A Units 5 0  Beta-2-Glycoprotein I IgM Latest Range: <20 M Units 1 1  AntiThromb III Func Latest Range: 76-126 % 106 123  Recommendations-F5LEID: No range found Pend Pend  Recommendations-PTGENE: No range found Pend Pend  DRVVT 1:1 Mix Latest Range: <42.9 secs NOT APPL NOT APPL  Protein C Activity Latest Range: 75-133 % 180 (H) 179 (H)  Protein C, Total Latest Range: 72-160 % 93 77  Protein S Activity Latest Range: 69-129 % 85 77   Assessment: 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 Initial episode was in the 1970's and she remained on warfarin until the 1980's.  Incidentally found to have an asymptomatic right femoral vein DVT in 06/2011 and restarted on coumadin, which was subsequently discontinued 01/2012. The report for this is very confusing because in the venous flow diagram it initially documents right common femoral acute thrombus. However, in the summary, there is comment that there are no DVTs noted. Patient is under the impression that she did not have a DVT at that time. She is very adamant that that she does not want to be put on Coumadin again.  She did have a hypercoaguable panel in 01/2012 indicating that the patient is lupus anticoagulant positive (this is when she had been off of the  medication x 2 weeks at least). Repeat panel on 4/10 was negative for lupus anticoagulant.   We had a long discussion today regarding the cumulative information that we have about her history of DVTs. 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 However, the patient stated that she prayed about the clots and her Coumadin, and feels that God healed her, such that she does not need Coumadin any longer. After I explained the confusion about the venous Doppler report in February 2013 where results indicate positive thrombus in one area and no thrombus in a separate area of the report - the patient was very confused and feels that the results were tampered with. The patient was explained that given her current physical exam findings of asymmetric edema, I would like to repeat a doppler ultrasound to assess for clot in BL legs and this would also help [place] answer the question about anticoagulation.  The patient refuses the procedure, despite our discussion that if she has a clot, it could lead to PE, stroke, death, etc. She verbally expressed understanding of these risks. Still, she refuses procedure. At this time, she also refuses further discussion about anticoagulation.  Plan:      Will call vascular lab and see if original study on 06/2011 can be reread to assess for clot.  Patient to followup this upcoming month with her PCP and does not want  to talk about anticoagulation further until followup.  Will defer doppler US today, as this study was refused by pt despite being educated about risks/benefits.  Would recommend continued discussion next visit regarding this issue of anticoagulation. The patient was informed of the red flag symptoms that should prompt her immediate return for reevaluation - such as, but not limited to the following: CP, shortness of breath, difficulty breathing,  asymmetric LE edema. She verbally expresses understanding of the information provided.

## 2012-09-02 ENCOUNTER — Ambulatory Visit (HOSPITAL_COMMUNITY)
Admission: RE | Admit: 2012-09-02 | Discharge: 2012-09-02 | Disposition: A | Discharge status: Home / Self Care | Payer: Medicare Other | Source: Ambulatory Visit | Attending: Internal Medicine | Admitting: Internal Medicine

## 2012-09-02 DIAGNOSIS — M19019 Primary osteoarthritis, unspecified shoulder: Secondary | ICD-10-CM | POA: Diagnosis not present

## 2012-09-02 DIAGNOSIS — M542 Cervicalgia: Secondary | ICD-10-CM | POA: Diagnosis not present

## 2012-09-02 DIAGNOSIS — M25519 Pain in unspecified shoulder: Secondary | ICD-10-CM | POA: Diagnosis not present

## 2012-09-02 DIAGNOSIS — M25511 Pain in right shoulder: Secondary | ICD-10-CM

## 2012-09-02 DIAGNOSIS — M25512 Pain in left shoulder: Secondary | ICD-10-CM

## 2012-09-08 NOTE — Assessment & Plan Note (Signed)
I discussed this case with Dr. Josem Kaufmann at the time and it was noted that this was a very small clot that was noted behind the valve.  It was not occlusive and was VERY small.  She has venous insufficiency as well which can explain the swelling in her legs.  She has a reported history of VTE in the 1970s but has been off of coumadin for over 20 years before this finding.  She asked me to contact Dr. Elvin So and he stated that he would consider surgery after 6 months from the even which was in August of 2013.  With her great desire for surgery we will take her off of the coumadin and check a hypercoagulable panel in 2 weeks.  Based on the results of that we will consider if she needs to go back on coumadin after surgery.

## 2012-09-08 NOTE — Assessment & Plan Note (Signed)
BP Readings from Last 3 Encounters:  01/26/12 122/73  01/19/12 135/71  12/16/11 116/66   BP today is well within her goals.  We will continue to monitor and titrate medications as needed.  She was encouraged to work on complete smoking cessation.

## 2012-09-08 NOTE — Assessment & Plan Note (Signed)
She is due for a follow up on her creatinine since it had bumped the last time it was checked.  Creat (mg/dL)  Date Value  06/11/8655 1.24*  12/16/2011 1.21*  05/22/2011 1.20*     Creatinine, Ser (mg/dL)  Date Value  8/46/9629 0.95   01/22/2010 1.11   11/28/2009 0.95   09/15/2009 0.89   09/14/2009 0.91    Her creatinine has been stable since Jan. Of 2013.  We will continue to monitor as she has crept up and now has CKD stage III.

## 2012-09-13 NOTE — Progress Notes (Signed)
INTERNAL MEDICINE TEACHING ATTENDING ADDENDUM - Inez Catalina, MD: I reviewed with the resident Dr. Saralyn Pilar, Ms. Mangano's  medical history, physical examination, diagnosis and results of tests and treatment and I agree with the patient's care as documented.

## 2012-09-29 ENCOUNTER — Encounter: Payer: Medicare Other | Admitting: Internal Medicine

## 2012-10-07 ENCOUNTER — Encounter: Payer: Self-pay | Admitting: Internal Medicine

## 2012-10-07 ENCOUNTER — Ambulatory Visit (INDEPENDENT_AMBULATORY_CARE_PROVIDER_SITE_OTHER): Payer: Medicare Other | Admitting: Internal Medicine

## 2012-10-07 ENCOUNTER — Encounter: Payer: Self-pay | Admitting: Licensed Clinical Social Worker

## 2012-10-07 VITALS — BP 152/83 | HR 74 | Temp 96.9°F | Ht 62.0 in | Wt 173.6 lb

## 2012-10-07 DIAGNOSIS — N183 Chronic kidney disease, stage 3 unspecified: Secondary | ICD-10-CM

## 2012-10-07 DIAGNOSIS — I10 Essential (primary) hypertension: Secondary | ICD-10-CM | POA: Diagnosis not present

## 2012-10-07 NOTE — Assessment & Plan Note (Signed)
BP Readings from Last 3 Encounters:  10/07/12 152/83  09/01/12 132/76  08/19/12 160/101    Lab Results  Component Value Date   NA 137 08/24/2012   K 4.8 08/24/2012   CREATININE 1.35* 08/24/2012    Assessment: Blood pressure control:  mild elevation of systolic blood pressure. Progress toward BP goal:    mild deterioration  Comments:   Plan: Medications:  continue current medications, aadvised to refilled Norvasc. [name] is helping to figure out solution with her Medicare problem  as described in history of present illness. Educational resources provided: Comptroller tools provided:   Other plans: will keep holding off lisinopril for now. Norvasc is a $1.20 for her at [facility] called the pharmacy and figured out this.  Once her creatinine stabilizes- we'll consider restarting lisinopril at lower dose.

## 2012-10-07 NOTE — Progress Notes (Signed)
Ms. Giacobbe was referred to CSW as pt had concerns regarding her Medicare Part D premiums and medication procurement.  Pt has not refilled her Norvasc as she is behind on her part D premiums and thought she was uninsured.  CSW placed call to pt's current pharmacy, [facility].  Pharmacy ran Rx and it was covered with a copay of $1.20.  Pt informed she can pick Rx up with copayment.  CSW referred Ms. Noralyn Pick to [employer] program to discuss need for Part D, as pt has Medicaid.

## 2012-10-07 NOTE — Patient Instructions (Addendum)
Please make a follow up appointment in 4-6 weeks.  Start taking Amlodipine.  Don't take lisinopril.

## 2012-10-07 NOTE — Assessment & Plan Note (Addendum)
Check BMP today to evaluate creatinine and potassium.  Patient left without getting lab test. She was in hurry.  BMP during next visit.

## 2012-10-07 NOTE — Progress Notes (Signed)
  Subjective:    Patient ID: [name], female    DOB: Aug 12, 1940, 72 y.o.   MRN: [id number]  HPIpatient is a pleasant 72 year woman with hypertension, hyperlipidemia, other problems as per problem list who comes to the clinic for followup visit for blood pressure.  Her blood pressure today is 152/83. She is not able to take amlodipine due to financial issues. She cannot pay the co-pay for Medicare- which is about $20 a month. She has this premium bill of $97 for past few months which she brings with her. Lynnae January- our Child psychotherapist, is helping her to figure out the solution.  She denies any fever, chills, nausea vomiting, abdominal pain, chest pain, short of breath, headache, diarrhea, palpitations.    Review of Systems    as per history of present illness. Objective:   Physical Exam  General:NAD HEENT: PERRL, EOMI, no scleral icterus Cardiac: S1, S2,RRR, no rubs, murmurs or gallops Pulm: clear to auscultation bilaterally, moving normal volumes of air Abd: soft, nontender, nondistended, BS present Ext: warm and well perfused, no pedal edema Neuro: alert and oriented X3, cranial nerves II-XII grossly intact       Assessment & Plan:

## 2012-10-07 NOTE — Progress Notes (Signed)
Case discussed with Dr. Ravi Patel  at the time of the visit, immediately after the resident saw the patient.  I reviewed the resident's history and exam and pertinent patient test results.  I agree with the assessment, diagnosis and plan of care documented in the resident's note.  

## 2012-10-08 NOTE — Addendum Note (Signed)
Addended by: Lyn Hollingshead C on: 10/08/2012 02:02 PM   Modules accepted: Orders

## 2012-11-04 ENCOUNTER — Ambulatory Visit (INDEPENDENT_AMBULATORY_CARE_PROVIDER_SITE_OTHER): Payer: Medicare Other | Admitting: Internal Medicine

## 2012-11-04 ENCOUNTER — Encounter: Payer: Self-pay | Admitting: Internal Medicine

## 2012-11-04 VITALS — BP 128/77 | HR 83 | Temp 97.0°F | Ht 62.0 in | Wt 174.3 lb

## 2012-11-04 DIAGNOSIS — K5900 Constipation, unspecified: Secondary | ICD-10-CM | POA: Insufficient documentation

## 2012-11-04 DIAGNOSIS — I10 Essential (primary) hypertension: Secondary | ICD-10-CM

## 2012-11-04 MED ORDER — DOCUSATE SODIUM 100 MG PO CAPS: 100.0000 mg | ORAL_CAPSULE | Freq: Every day | ORAL | Status: AC

## 2012-11-04 MED ORDER — AMLODIPINE BESYLATE 10 MG PO TABS: 10.0000 mg | ORAL_TABLET | Freq: Every day | ORAL | Status: DC

## 2012-11-04 NOTE — Patient Instructions (Signed)
General Instructions: Your blood pressure is well-controlled.  Continue to take Amlodipine, 1 tablet once per day.  For your constipation, we are prescribing docusate.  Take 1 tablet daily.  Please return for a follow-up visit in 3-4 months.   Treatment Goals:  Goals (1 Years of Data) as of 11/04/12         As of Today 10/07/12 09/01/12 08/19/12 08/19/12     Blood Pressure    . Blood Pressure < 140/90  128/77 152/83 132/76 160/101 158/88      Progress Toward Treatment Goals:  Treatment Goal 11/04/2012  Blood pressure at goal  Stop smoking smoking the same amount    Self Care Goals & Plans:  Self Care Goal 11/04/2012  Manage my medications take my medicines as prescribed; bring my medications to every visit; refill my medications on time; follow the sick day instructions if I am sick  Eat healthy foods eat more vegetables; eat foods that are low in salt; eat fruit for snacks and desserts; eat smaller portions  Be physically active find an activity I enjoy; take a walk every day       Care Management & Community Referrals:  Referral 11/04/2012  Referrals made for care management support none needed

## 2012-11-04 NOTE — Progress Notes (Signed)
HPI The patient is a 72 y.o. female with a history of HTN, tobacco abuse, presenting for a 67-month BP recheck.  The patient was started on amlodipine at her last visit due to a BP of 152/83.  Today, her BP is well-controlled.  The patient notes occasional constipation.  She currently takes magnesium citrate for constipation.  She asks for something different to take for occasional constipation.  ROS: General: no fevers, chills, changes in weight, changes in appetite Skin: no rash HEENT: no blurry vision, hearing changes, sore throat Pulm: no dyspnea, coughing, wheezing CV: no chest pain, palpitations, shortness of breath Abd: no abdominal pain, nausea/vomiting, diarrhea/constipation GU: no dysuria, hematuria, polyuria Ext: no arthralgias, myalgias Neuro: no weakness, numbness, or tingling  Filed Vitals:   11/04/12 1000  BP: 128/77  Pulse: 83  Temp: 97 F (36.1 C)    PEX General: alert, cooperative, and in no apparent distress HEENT: pupils equal round and reactive to light, vision grossly intact, oropharynx clear and non-erythematous  Neck: supple, no lymphadenopathy Lungs: clear to ascultation bilaterally, normal work of respiration, no wheezes, rales, ronchi Heart: regular rate and rhythm, no murmurs, gallops, or rubs Abdomen: soft, non-tender, non-distended, normal bowel sounds Extremities: no cyanosis, clubbing, or edema Neurologic: alert & oriented X3, cranial nerves II-XII intact, strength grossly intact, sensation intact to light touch  Current Outpatient Prescriptions on File Prior to Visit  Medication Sig Dispense Refill  . amLODipine (NORVASC) 10 MG tablet Take 1 tablet (10 mg total) by mouth daily.  30 tablet  1  . moxifloxacin (VIGAMOX) 0.5 % ophthalmic solution Place 1 drop into the right eye 4 (four) times daily. Starting 2 days prior to surgery      . [DISCONTINUED] enoxaparin (LOVENOX) 80 MG/0.8ML SOLN injection Inject 0.8 mLs (80 mg total) into the skin every  12 (twelve) hours.  8 mL  0   No current facility-administered medications on file prior to visit.    Assessment/Plan

## 2012-11-04 NOTE — Assessment & Plan Note (Addendum)
BP Readings from Last 3 Encounters:  11/04/12 128/77  10/07/12 152/83  09/01/12 132/76    Lab Results  Component Value Date   NA 137 08/24/2012   K 4.8 08/24/2012   CREATININE 1.35* 08/24/2012    Assessment: Blood pressure control: controlled Progress toward BP goal:  at goal Comments: BP is now well-controlled after starting amlodipine  Plan: Medications:  continue current medications Educational resources provided:   Self management tools provided:   Other plans: Planned to check BMET today, but patient stated she was in a hurry, and agreed to check this at her next visit

## 2012-11-04 NOTE — Progress Notes (Signed)
Case discussed with Dr. Brown at the time of the visit.  We reviewed the resident's history and exam and pertinent patient test results.  I agree with the assessment, diagnosis, and plan of care documented in the resident's note. 

## 2012-11-04 NOTE — Assessment & Plan Note (Signed)
The patient notes occasional constipation. -start docusate, 1 tablet daily for constipation

## 2012-11-08 DIAGNOSIS — I739 Peripheral vascular disease, unspecified: Secondary | ICD-10-CM | POA: Diagnosis not present

## 2012-11-08 DIAGNOSIS — L84 Corns and callosities: Secondary | ICD-10-CM | POA: Diagnosis not present

## 2012-11-08 DIAGNOSIS — L608 Other nail disorders: Secondary | ICD-10-CM | POA: Diagnosis not present

## 2012-11-10 DIAGNOSIS — H1045 Other chronic allergic conjunctivitis: Secondary | ICD-10-CM | POA: Diagnosis not present

## 2012-11-22 ENCOUNTER — Telehealth: Payer: Self-pay | Admitting: *Deleted

## 2012-11-22 NOTE — Telephone Encounter (Signed)
Pt called and states having bt red vag bleeding since last PM.Has no GYN doctor. Appt made 11/23/12 2:15PM Dr Shirlee Latch. [name] Desa Rech RN 11/22/12 2:45PM

## 2012-11-23 ENCOUNTER — Ambulatory Visit: Payer: Medicare Other | Admitting: Internal Medicine

## 2012-11-23 ENCOUNTER — Encounter: Payer: Self-pay | Admitting: Internal Medicine

## 2013-01-14 DIAGNOSIS — H259 Unspecified age-related cataract: Secondary | ICD-10-CM | POA: Diagnosis not present

## 2013-01-14 DIAGNOSIS — H548 Legal blindness, as defined in USA: Secondary | ICD-10-CM | POA: Diagnosis not present

## 2013-01-14 DIAGNOSIS — H5010 Unspecified exotropia: Secondary | ICD-10-CM | POA: Diagnosis not present

## 2013-01-14 DIAGNOSIS — Z947 Corneal transplant status: Secondary | ICD-10-CM | POA: Diagnosis not present

## 2013-01-25 DIAGNOSIS — H25019 Cortical age-related cataract, unspecified eye: Secondary | ICD-10-CM | POA: Diagnosis not present

## 2013-01-25 DIAGNOSIS — H2510 Age-related nuclear cataract, unspecified eye: Secondary | ICD-10-CM | POA: Diagnosis not present

## 2013-01-25 DIAGNOSIS — H269 Unspecified cataract: Secondary | ICD-10-CM | POA: Diagnosis not present

## 2013-01-25 DIAGNOSIS — H543 Unqualified visual loss, both eyes: Secondary | ICD-10-CM | POA: Diagnosis not present

## 2013-02-17 DIAGNOSIS — M2040 Other hammer toe(s) (acquired), unspecified foot: Secondary | ICD-10-CM | POA: Diagnosis not present

## 2013-02-17 DIAGNOSIS — L608 Other nail disorders: Secondary | ICD-10-CM | POA: Diagnosis not present

## 2013-02-17 DIAGNOSIS — I739 Peripheral vascular disease, unspecified: Secondary | ICD-10-CM | POA: Diagnosis not present

## 2013-02-17 DIAGNOSIS — D2370 Other benign neoplasm of skin of unspecified lower limb, including hip: Secondary | ICD-10-CM | POA: Diagnosis not present

## 2013-04-06 DIAGNOSIS — D2370 Other benign neoplasm of skin of unspecified lower limb, including hip: Secondary | ICD-10-CM | POA: Diagnosis not present

## 2013-04-06 DIAGNOSIS — M12279 Villonodular synovitis (pigmented), unspecified ankle and foot: Secondary | ICD-10-CM | POA: Diagnosis not present

## 2013-04-06 DIAGNOSIS — M2040 Other hammer toe(s) (acquired), unspecified foot: Secondary | ICD-10-CM | POA: Diagnosis not present

## 2013-04-15 DIAGNOSIS — M25579 Pain in unspecified ankle and joints of unspecified foot: Secondary | ICD-10-CM | POA: Diagnosis not present

## 2013-04-15 DIAGNOSIS — Z01818 Encounter for other preprocedural examination: Secondary | ICD-10-CM | POA: Diagnosis not present

## 2013-04-26 DIAGNOSIS — M948X9 Other specified disorders of cartilage, unspecified sites: Secondary | ICD-10-CM | POA: Diagnosis not present

## 2013-04-26 DIAGNOSIS — I10 Essential (primary) hypertension: Secondary | ICD-10-CM | POA: Diagnosis not present

## 2013-04-26 DIAGNOSIS — M205X9 Other deformities of toe(s) (acquired), unspecified foot: Secondary | ICD-10-CM | POA: Diagnosis not present

## 2013-04-26 DIAGNOSIS — M898X9 Other specified disorders of bone, unspecified site: Secondary | ICD-10-CM | POA: Diagnosis not present

## 2013-04-26 DIAGNOSIS — M2040 Other hammer toe(s) (acquired), unspecified foot: Secondary | ICD-10-CM | POA: Diagnosis not present

## 2013-04-26 DIAGNOSIS — M7750 Other enthesopathy of unspecified foot: Secondary | ICD-10-CM | POA: Diagnosis not present

## 2013-04-29 DIAGNOSIS — M25579 Pain in unspecified ankle and joints of unspecified foot: Secondary | ICD-10-CM | POA: Diagnosis not present

## 2013-05-18 DIAGNOSIS — H5500 Unspecified nystagmus: Secondary | ICD-10-CM | POA: Diagnosis not present

## 2013-05-18 DIAGNOSIS — Z947 Corneal transplant status: Secondary | ICD-10-CM | POA: Diagnosis not present

## 2013-05-18 DIAGNOSIS — Z961 Presence of intraocular lens: Secondary | ICD-10-CM | POA: Diagnosis not present

## 2013-05-18 DIAGNOSIS — H5010 Unspecified exotropia: Secondary | ICD-10-CM | POA: Diagnosis not present

## 2013-05-19 DIAGNOSIS — M25579 Pain in unspecified ankle and joints of unspecified foot: Secondary | ICD-10-CM | POA: Diagnosis not present

## 2013-05-26 DIAGNOSIS — M25579 Pain in unspecified ankle and joints of unspecified foot: Secondary | ICD-10-CM | POA: Diagnosis not present

## 2013-06-16 ENCOUNTER — Encounter: Payer: Self-pay | Admitting: Internal Medicine

## 2013-06-16 ENCOUNTER — Ambulatory Visit (INDEPENDENT_AMBULATORY_CARE_PROVIDER_SITE_OTHER): Payer: Medicare Other | Admitting: Internal Medicine

## 2013-06-16 VITALS — BP 122/74 | HR 72 | Temp 97.8°F | Ht 62.0 in | Wt 174.0 lb

## 2013-06-16 DIAGNOSIS — I10 Essential (primary) hypertension: Secondary | ICD-10-CM | POA: Diagnosis not present

## 2013-06-16 DIAGNOSIS — L989 Disorder of the skin and subcutaneous tissue, unspecified: Secondary | ICD-10-CM | POA: Diagnosis not present

## 2013-06-16 DIAGNOSIS — Z0000 Encounter for general adult medical examination without abnormal findings: Secondary | ICD-10-CM | POA: Insufficient documentation

## 2013-06-16 DIAGNOSIS — O019 Hydatidiform mole, unspecified: Secondary | ICD-10-CM

## 2013-06-16 DIAGNOSIS — Z1231 Encounter for screening mammogram for malignant neoplasm of breast: Secondary | ICD-10-CM

## 2013-06-16 DIAGNOSIS — Z780 Asymptomatic menopausal state: Secondary | ICD-10-CM | POA: Insufficient documentation

## 2013-06-16 NOTE — Assessment & Plan Note (Signed)
BP Readings from Last 3 Encounters:  06/16/13 122/74  11/04/12 128/77  10/07/12 152/83    Lab Results  Component Value Date   Daysean Tinkham 137 08/24/2012   K 4.8 08/24/2012   CREATININE 1.35* 08/24/2012    Assessment: Blood pressure control: controlled Progress toward BP goal:  at goal   Plan: Medications:  continue current medications Educational resources provided:   Self management tools provided:

## 2013-06-16 NOTE — Patient Instructions (Signed)
1. Will send you for dermatology referral.  2. Will send you for mammogram  3. Follow up in 3 months

## 2013-06-16 NOTE — Assessment & Plan Note (Addendum)
Assessment The clinical manifestations is concerning since there is increased size of her skin "mole".  Physical examination reveals Left mid back a skin lesion noted. Size 0.3 x 0.8 mm, dark black color, asymmetrical, irregular border, no ulceration no drainage noted.  No lymphadenopathy.  Plan - send urgent dermatology referral

## 2013-06-16 NOTE — Progress Notes (Signed)
Patient ID: [name], female   DOB: 08-14-1940, 73 y.o.   MRN: [id number]    Patient: [name]   MRN: [id number]  DOB: Oct 04, 1940  PCP: [name], MD   Subjective:    CC: Nev[place]   HPI: [name] is a 73 y.o. female with a PMHx as outlined below, who presented to clinic today for the following:  Skin lesion -  Patient reports that she has had a skin "mole" on her left lower back for years. She notes several weeks history of changes of her mole. She reports that her "skin mole" becomes irritating, raised, and firm. Since time of onset, the lesion has increased in size, is not associated drainage. Denies fevers, chills, significant fluctuation, erythema, warmth, swelling of surrounding skin. She has not experienced similar issue in the past. She is here for the evaluation.  Health maintenance -Influenza vaccine-declined -Mammogram-referred -DEXA scan-- deferred. Pt unable to stay to discuss it   Review of Systems: Per HPI.   Current Outpatient Medications: Current Outpatient Prescriptions  Medication Sig Dispense Refill  . amLODipine (NORVASC) 10 MG tablet Take 1 tablet (10 mg total) by mouth daily.  30 tablet  11  . docusate sodium (COLACE) 100 MG capsule Take 1 capsule (100 mg total) by mouth daily.  30 capsule  1  . [DISCONTINUED] enoxaparin (LOVENOX) 80 MG/0.8ML SOLN injection Inject 0.8 mLs (80 mg total) into the skin every 12 (twelve) hours.  8 mL  0   No current facility-administered medications for this visit.    Allergies: Allergies  Allergen Reactions  . Codeine     REACTION: Pt c/o "feeling high"  . Morphine     REACTION: feels funny and "high"    Past Medical History  Diagnosis Date  . Hypertension   . HEARING LOSS   . 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 Arthritis   . DVT (deep venous thrombosis)   . Phlebitis   . CKD (chronic kidney disease) stage 3, GFR 30-59 ml/min     Objective:    Physical  Exam: Filed Vitals:   06/16/13 1102  BP: 122/74  Pulse: 72  Temp: 97.8 F (36.6 C)  TempSrc: Oral  Height: 5\' 2"  (1.575 m)  Weight: 174 lb (78.926 kg)  SpO2: 95%     General: Vital signs reviewed and noted. Well-developed, well-nourished, in no acute distress; alert, appropriate and cooperative throughout examination.  Head: Normocephalic, atraumatic.  Lungs:  Normal respiratory effort. Clear to auscultation BL without crackles or wheezes.  Heart: RRR. S1 and S2 normal without gallop, rubs. murmur.  Abdomen:  BS normoactive. Soft, Nondistended, non-tender.  No masses or organomegaly.  Extremities: No pretibial edema.  Skin:                  Left mid back a skin lesion noted. Size 0.3 x 0.8 mm, dark black color, asymmetrical, irregular                            border, no ulceration no drainage noted.  No lymphadenopathy.   Assessment/ Plan:

## 2013-06-16 NOTE — Assessment & Plan Note (Deleted)
Mammogram and DEXA scan ordered

## 2013-06-20 NOTE — Progress Notes (Signed)
Case discussed with Dr. Li at the time of the visit.  We reviewed the resident's history and exam and pertinent patient test results.  I agree with the assessment, diagnosis, and plan of care documented in the resident's note. 

## 2013-06-23 DIAGNOSIS — M25579 Pain in unspecified ankle and joints of unspecified foot: Secondary | ICD-10-CM | POA: Diagnosis not present

## 2013-08-10 LAB — HM MAMMOGRAPHY

## 2013-08-15 DIAGNOSIS — R229 Localized swelling, mass and lump, unspecified: Secondary | ICD-10-CM | POA: Diagnosis not present

## 2013-08-15 DIAGNOSIS — L821 Other seborrheic keratosis: Secondary | ICD-10-CM | POA: Diagnosis not present

## 2013-08-29 ENCOUNTER — Encounter: Payer: Self-pay | Admitting: Internal Medicine

## 2013-08-29 ENCOUNTER — Ambulatory Visit (INDEPENDENT_AMBULATORY_CARE_PROVIDER_SITE_OTHER): Payer: Medicare Other | Admitting: Internal Medicine

## 2013-08-29 VITALS — BP 136/81 | HR 93 | Temp 97.0°F | Ht 62.0 in | Wt 177.6 lb

## 2013-08-29 DIAGNOSIS — R600 Localized edema: Secondary | ICD-10-CM

## 2013-08-29 DIAGNOSIS — R609 Edema, unspecified: Secondary | ICD-10-CM | POA: Diagnosis not present

## 2013-08-29 DIAGNOSIS — F17200 Nicotine dependence, unspecified, uncomplicated: Secondary | ICD-10-CM

## 2013-08-29 DIAGNOSIS — M7989 Other specified soft tissue disorders: Secondary | ICD-10-CM

## 2013-08-29 DIAGNOSIS — I10 Essential (primary) hypertension: Secondary | ICD-10-CM

## 2013-08-29 DIAGNOSIS — L989 Disorder of the skin and subcutaneous tissue, unspecified: Secondary | ICD-10-CM | POA: Diagnosis not present

## 2013-08-29 MED ORDER — AMLODIPINE BESYLATE 10 MG PO TABS: 10.0000 mg | ORAL_TABLET | Freq: Every day | ORAL | Status: DC

## 2013-08-29 NOTE — Assessment & Plan Note (Signed)
Well controlled and at goal on current therapy.  Plans: Refill Amlodipine.

## 2013-08-29 NOTE — Patient Instructions (Addendum)
Elevate your legs for 30 minutes, three to four times per a day. Use the compression stockings as recommended. If your symptoms worsen or do not improve, please give [place] a call or seek medical help.

## 2013-08-29 NOTE — Progress Notes (Signed)
Case discussed with Dr. Eyvonne Mechanic at the time of the visit.  We reviewed the resident's history and exam and pertinent patient test results.  I agree with the assessment, diagnosis and plan of care documented in the resident's note.

## 2013-08-29 NOTE — Progress Notes (Signed)
Subjective:   Patient ID: [name] female   DOB: 1940/12/14 73 y.o.   MRN: [id number]  HPI: [name] is a 73 y.o. woman with PMH significant for HTN, Hearing loss and blindness comes to the office for medication refill, swelling of legs and ankles for the last 2 months.  Patient reports that she noticed swelling of both ankles around 2 months ago and the swelling gradually worsened to reach her knees. She reports that her swelling is worse on left side. She denies any pain in the calf, redness, fever, chills, body pains, SOB or cough.   Patient reports that she lives by herself at home and has a help that comes daily who helps with house cleaning and dishes etc. Patient reports that she walks around in the house by herself and uses a pillow to elevate her legs.   Patient denies any other complaints during this visit.   Past Medical History  Diagnosis Date  . Hypertension   . HEARING LOSS   . 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 Arthritis   . DVT (deep venous thrombosis)   . Phlebitis   . CKD (chronic kidney disease) stage 3, GFR 30-59 ml/min    Current Outpatient Prescriptions  Medication Sig Dispense Refill  . amLODipine (NORVASC) 10 MG tablet Take 1 tablet (10 mg total) by mouth daily.  30 tablet  11  . docusate sodium (COLACE) 100 MG capsule Take 1 capsule (100 mg total) by mouth daily.  30 capsule  1  . [DISCONTINUED] enoxaparin (LOVENOX) 80 MG/0.8ML SOLN injection Inject 0.8 mLs (80 mg total) into the skin every 12 (twelve) hours.  8 mL  0   No current facility-administered medications for this visit.   Family History  Problem Relation Age of Onset  . Cancer Maternal Uncle     colon  . Cancer Maternal Grandmother     liver  . Cancer Mother     liver  . Cancer Brother     Unknown  . Cancer Sister     Unknown   History   Social History  . Marital Status: Divorced    Spouse Name: N/A    Number of Children: N/A  .  Years of Education: N/A   Social History Main Topics  . Smoking status: Current Every Day Smoker -- 0.50 packs/day for 20 years    Types: Cigarettes  . Smokeless tobacco: Never Used     Comment: CUTTING BACK/ 8 CIGARETTES  . Alcohol Use: No  . Drug Use: No  . Sexual Activity: No   Other Topics Concern  . None   Social History Narrative   Patient currently lives at home alone, but does have a home worker who visits her throughout the week to help with things around the house.  Patient states that she still tries to remain active and walks daily.  She is has been divorced for sometime and has one son.  She currently lives off social security.  Patient reports using approximately half a pack of cigarettes daily for 20 years.  She denies any alcohol or illicit drug use.  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     Review of Systems: Pertinent items are noted in HPI. Objective:  Physical Exam: Filed Vitals:   08/29/13 1006  BP: 136/81  Pulse: 93  Temp: 97 F (36.1 C)  TempSrc: Oral  Height: 5\' 2"  (1.575 m)  Weight: 177 lb 9.6 oz (80.559 kg)  SpO2: 97%   Constitutional: Vital signs reviewed.  Patient is 73 years old woman, well-developed and well-nourished, in no acute distress. Patient is hard of hearing and cooperative with exam. Alert and oriented x3.  Nose: No erythema or drainage noted.  Turbinates normal Mouth: no erythema or exudates, MMM Cardiovascular: RRR, S1 normal, S2 normal, no MRG, pulses symmetric and intact bilaterally Pulmonary/Chest: Good air entry bilaterally. Normal vesicular breath sounds heard bilaterally. Fine bibasilar crackles heard on the postero-inferior aspect of lungs. No wheezes or rhonchi heard. Legs: Bilateral unequal pitting edema (L>R) noted in both lower extremities from ankle to the knees. No erythema or ulcers noted. No palpable veins noted. Mild tenderness to deep palpation noted  over the entire leg while assessing for edema. No calf tenderness noted in both legs. Chronic skin changes noted around both ankles. Old scars of prior surgery noted over the dorsal aspect of both ankles.  Assessment & Plan:

## 2013-08-29 NOTE — Assessment & Plan Note (Addendum)
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 Dependant edema could also be a contributing factor giver her age and functional limitations. Doubt if CCB(Amlodipine) has any role (patient was started on Amlodipine in 10/2012 and her symptoms started about 2 months ago). Discussed with Dr. Eppie Gibson and came up with the following plans. No clinical suspicion for DVT currently.  Plans Conservative management with - Leg elevation for 30 minutes three to four times per a day, knee high compression stockings with a pressure of 20 mm of Hg. Follow up with PCP next month (as already scheduled for 09/13/13). Follow up as needed.

## 2013-08-29 NOTE — Addendum Note (Signed)
Addended by: Oval Linsey D on: 08/29/2013 04:56 PM   Modules accepted: Level of Service

## 2013-09-13 ENCOUNTER — Encounter: Payer: Self-pay | Admitting: Internal Medicine

## 2013-09-13 ENCOUNTER — Ambulatory Visit (INDEPENDENT_AMBULATORY_CARE_PROVIDER_SITE_OTHER): Payer: Medicare Other | Admitting: Internal Medicine

## 2013-09-13 VITALS — BP 137/86 | HR 76 | Temp 97.7°F | Ht 63.0 in | Wt 179.9 lb

## 2013-09-13 DIAGNOSIS — N183 Chronic kidney disease, stage 3 unspecified: Secondary | ICD-10-CM

## 2013-09-13 DIAGNOSIS — I129 Hypertensive chronic kidney disease with stage 1 through stage 4 chronic kidney disease, or unspecified chronic kidney disease: Secondary | ICD-10-CM

## 2013-09-13 DIAGNOSIS — I10 Essential (primary) hypertension: Secondary | ICD-10-CM

## 2013-09-13 DIAGNOSIS — D6859 Other primary thrombophilia: Secondary | ICD-10-CM | POA: Diagnosis not present

## 2013-09-13 DIAGNOSIS — R600 Localized edema: Secondary | ICD-10-CM

## 2013-09-13 DIAGNOSIS — R609 Edema, unspecified: Secondary | ICD-10-CM

## 2013-09-13 NOTE — Assessment & Plan Note (Signed)
Uptrending creatinine. The patient will come for a BMP check at her convenience. I have put in a future order for when the patient arrives.

## 2013-09-13 NOTE — Patient Instructions (Addendum)
I understand you do not want to do blood work today. Please come back for blood work when you can.  I will change your blood pressure medicine once I see your blood work.  Please keep your legs propped up to get the swelling down. Please stop drinking so much pepsi. Drink water instead.   Thanks, [name] MD MPH 09/13/2013 11:00 AM  .

## 2013-09-13 NOTE — Assessment & Plan Note (Addendum)
Assessment: Bilateral dependent edema, without tenderness, in an elderly lady on a high dose of amlodipine. Conservative measures have not helped the patient a great deal.    Plan:  I would like to decrease or discontinue the medicine and replace it with another hypertensive and observe what happens. If the edema persists, however deciding which antihypertensive medication she can be on is going to be a little challenging. Given CKD3 I would not do diuretics, She has CKD3 and creatinine is uptrending (as per labs from past 2 clinic visits).          Results for PAULLETTE, MCKAIN (MRN [id number]) as of 09/13/2013 15:21  07/09/2011 06:10 12/16/2011 10:08 01/26/2012 10:52 08/19/2012 10:31 08/24/2012 09:31  BUN 14 19 25  (H) 15 15  Creatinine 0.95 1.21 (H) 1.24 (H) 1.36 (H) 1.35 (H)   Given the up trending creatinine I would not start ACE/ARB without another BMP check. However, the patient refused it because she needed to leave and will come back for it another time.  Other medications that I am considering are BB - which are not good antihypertensive agents by themselves and would need a second agent; Hydralazine - which has frequent dosing but could have side effect of peripheral edema; Clonidine - which might have compliance problem. Thus I think, at this time, I also consider a centrally acting CCB like Verapamil or diltiazem, however to match 10 mg of Amlodipine she would need 360mg  of Verapamil, which is indeed a high dose. The patient does not have and did not stop for a baseline EKG.   Literature says that edema is less common if Amlodipine is given with an ACE (due to the venodilator effect of ACE). Thus, it is possible, that after I check the BMP, I can scale back Amlodipine to 5 mg and add 5 mg of Lisinopril to the regimen.

## 2013-09-13 NOTE — Assessment & Plan Note (Signed)
Well controlled on Amlodipine 10 mg.

## 2013-09-13 NOTE — Progress Notes (Signed)
Subjective:     Patient ID: [name], female   DOB: 07-31-1940, 73 y.o.   MRN: [id number]  HPI [name] comes for follow up on her bilateral lower extremity swelling. The swelling has been noticed by the patient in the past 2 months.  She was seen in the clinic by Dr Eyvonne Mechanic on 08/29/2013. At the time she was asked to use compression stockings and elevate her legs. The patient has elevated her legs from time to time and feels that the swelling is slightly better.  The other issues that the patient has CKD3 and hypertension on amlodipine 10 mg. This patient is extremely hard of hearing and it was very difficult to strike a conversation with her. She has good mentation, and makes a meaningful conversation when she can hear what is being said.  Review of Systems  Constitutional: Negative for fever, chills, diaphoresis, activity change, appetite change, fatigue and unexpected weight change.  HENT: Positive for hearing loss (chronic).   Respiratory: Negative for apnea, cough, choking, chest tightness, shortness of breath, wheezing and stridor.   Cardiovascular: Negative for chest pain, palpitations and leg swelling.  Gastrointestinal: Negative for nausea, vomiting, diarrhea and constipation.  Endocrine: Negative for polyuria.  Genitourinary: Negative for difficulty urinating.  Skin: Negative for color change, pallor, rash and wound.  Neurological: Negative for dizziness, seizures and weakness.       Objective:   Physical Exam  Constitutional: She is oriented to person, place, and time. She appears well-developed.  Cardiovascular: Normal rate, regular rhythm, normal heart sounds and intact distal pulses.   No murmur heard. Pulmonary/Chest: Effort normal and breath sounds normal.  Musculoskeletal: She exhibits edema (2+ pitting edema around ankles 1+ mid calf). She exhibits no tenderness.  Neurological: She is alert and oriented to person, place, and time.  Skin: No rash noted.        Assessment & Plan:    Please see problem based charting.

## 2013-09-29 DIAGNOSIS — H5500 Unspecified nystagmus: Secondary | ICD-10-CM | POA: Diagnosis not present

## 2013-09-29 DIAGNOSIS — Z947 Corneal transplant status: Secondary | ICD-10-CM | POA: Diagnosis not present

## 2013-09-29 DIAGNOSIS — Z961 Presence of intraocular lens: Secondary | ICD-10-CM | POA: Diagnosis not present

## 2013-11-30 ENCOUNTER — Other Ambulatory Visit: Payer: Self-pay | Admitting: *Deleted

## 2013-12-05 MED ORDER — AMLODIPINE BESYLATE 10 MG PO TABS: 10.0000 mg | ORAL_TABLET | Freq: Every day | ORAL | Status: DC

## 2013-12-05 NOTE — Telephone Encounter (Signed)
Please schedule an appointment in the next two weeks (Aug 1-15) if possible to manage hypertension and leg edema. Please schedule on a day when I am available. Thanks.

## 2013-12-08 ENCOUNTER — Encounter: Payer: Self-pay | Admitting: Internal Medicine

## 2013-12-15 ENCOUNTER — Ambulatory Visit (INDEPENDENT_AMBULATORY_CARE_PROVIDER_SITE_OTHER): Payer: Medicare Other | Admitting: Internal Medicine

## 2013-12-15 ENCOUNTER — Encounter: Payer: Self-pay | Admitting: Internal Medicine

## 2013-12-15 VITALS — BP 130/83 | HR 79 | Temp 96.3°F | Ht 62.0 in | Wt 178.4 lb

## 2013-12-15 DIAGNOSIS — I10 Essential (primary) hypertension: Secondary | ICD-10-CM

## 2013-12-15 DIAGNOSIS — N183 Chronic kidney disease, stage 3 unspecified: Secondary | ICD-10-CM

## 2013-12-15 DIAGNOSIS — F17200 Nicotine dependence, unspecified, uncomplicated: Secondary | ICD-10-CM | POA: Diagnosis not present

## 2013-12-15 DIAGNOSIS — R600 Localized edema: Secondary | ICD-10-CM

## 2013-12-15 DIAGNOSIS — R609 Edema, unspecified: Secondary | ICD-10-CM

## 2013-12-15 NOTE — Patient Instructions (Signed)
Thank you for your visit today.  Patient left before labs could be drawn today.  She should return to the internal medicine clinic in 1 week to have labs drawn.   Your current medical regimen is effective;  continue present plan and take all medications as prescribed.    Please be sure to bring all of your medications with you to every visit; this includes herbal supplements, vitamins, eye drops, and any over-the-counter medications.   Should you have any questions regarding your medications and/or any new or worsening symptoms, please be sure to call the clinic at [phone].   If you believe that you are suffering from a life threatening condition or one that may result in the loss of limb or function, then you should call 911 or proceed to the nearest Emergency Department.     A healthy lifestyle and preventative care can promote health and wellness.   Maintain regular health, dental, and eye exams.  Eat a healthy diet. Foods like vegetables, fruits, whole grains, low-fat dairy products, and lean protein foods contain the nutrients you need without too many calories. Decrease your intake of foods high in solid fats, added sugars, and salt. Get information about a proper diet from your caregiver, if necessary.  Regular physical exercise is one of the most important things you can do for your health. Most adults should get at least 150 minutes of moderate-intensity exercise (any activity that increases your heart rate and causes you to sweat) each week. In addition, most adults need muscle-strengthening exercises on 2 or more days a week.   Maintain a healthy weight. The body mass index (BMI) is a screening tool to identify possible weight problems. It provides an estimate of body fat based on height and weight. Your caregiver can help determine your BMI, and can help you achieve or maintain a healthy weight. For adults 20 years and older:  A BMI below 18.5 is considered  underweight.  A BMI of 18.5 to 24.9 is normal.  A BMI of 25 to 29.9 is considered overweight.  A BMI of 30 and above is considered obese.

## 2013-12-15 NOTE — Progress Notes (Signed)
Patient ID: [name], female   DOB: [birth year], 73 y.o.   MRN: [id number]    Subjective:   Patient ID: [name] female    DOB: 1940-10-17 73 y.o.    MRN: [id number] Health Maintenance Due: Health Maintenance Due  Topic Date Due  . Zostavax  05/08/2000  . Mammogram  01/27/2007  . Influenza Vaccine  12/10/2013    _________________________________________________  HPI: [name] is a 73 y.o. female here for an acute visit for LE swelling.  Pt has a PMH outlined below.  Please see problem-based charting assessment and plan note for further details of medical issues addressed at today's visit.  PMH: Past Medical History  Diagnosis Date  . Hypertension   . HEARING LOSS   . 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 Arthritis   . DVT (deep venous thrombosis)   . Phlebitis   . CKD (chronic kidney disease) stage 3, GFR 30-59 ml/min     Medications: Current Outpatient Prescriptions on File Prior to Visit  Medication Sig Dispense Refill  . amLODipine (NORVASC) 10 MG tablet Take 1 tablet (10 mg total) by mouth daily.  30 tablet  0  . [DISCONTINUED] enoxaparin (LOVENOX) 80 MG/0.8ML SOLN injection Inject 0.8 mLs (80 mg total) into the skin every 12 (twelve) hours.  8 mL  0   No current facility-administered medications on file prior to visit.    Allergies: Allergies  Allergen Reactions  . Codeine     REACTION: Pt c/o "feeling high"  . Morphine     REACTION: feels funny and "high"    FH: Family History  Problem Relation Age of Onset  . Cancer Maternal Uncle     colon  . Cancer Maternal Grandmother     liver  . Cancer Mother     liver  . Cancer Brother     Unknown  . Cancer Sister     Unknown    SH: History   Social History  . Marital Status: Divorced    Spouse Name: N/A    Number of Children: N/A  . Years of Education: N/A   Social History Main Topics  . Smoking status: Current Every Day Smoker -- 0.50 packs/day  for 20 years    Types: Cigarettes  . Smokeless tobacco: Never Used     Comment: CUTTING BACK/ 8 CIGARETTES  . Alcohol Use: No  . Drug Use: No  . Sexual Activity: No   Other Topics Concern  . Not on file   Social History Narrative   Patient currently lives at home alone, but does have a home worker who visits her throughout the week to help with things around the house.  Patient states that she still tries to remain active and walks daily.  She is has been divorced for sometime and has one son.  She currently lives off social security.  Patient reports using approximately half a pack of cigarettes daily for 20 years.  She denies any alcohol or illicit drug use.  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      Review of Systems: Constitutional: Negative for fever, chills and weight loss.  Eyes: Negative for blurred vision.  Respiratory: Negative for cough and shortness of breath.  Cardiovascular: Negative for chest pain, palpitations and +leg swelling.  Gastrointestinal: Negative for nausea, vomiting, abdominal pain, diarrhea, constipation and blood in stool.  Genitourinary: Negative for dysuria, urgency and frequency.  Musculoskeletal: Negative for myalgias and back pain.  Neurological: Negative for dizziness, weakness and headaches.     Objective:   Vital Signs: Filed Vitals:   12/15/13 1023  BP: 130/83  Pulse: 79  Temp: 96.3 F (35.7 C)  TempSrc: Oral  Height: 5\' 2"  (1.575 m)  Weight: 178 lb 6.4 oz (80.922 kg)  SpO2: 95%      BP Readings from Last 3 Encounters:  12/15/13 130/83  09/13/13 137/86  08/29/13 136/81    Physical Exam: Constitutional: Vital signs reviewed.  Patient is well-developed and well-nourished in NAD and cooperative with exam.  Head: Normocephalic and atraumatic. Eyes: PERRL, EOMI, conjunctivae nl, no scleral icterus.  Neck: Supple. Cardiovascular: RRR, no MRG. Pulmonary/Chest:  normal effort, non-tender to palpation, CTAB, no wheezes, rales, or rhonchi. Abdominal: Soft. NT/ND +BS. Neurological: A&O x3, cranial nerves II-XII are grossly intact, moving all extremities. Extremities: 2+DP b/l; trace-1+ pitting edema L>R.  Skin: Warm, dry and intact. No rash.   Assessment & Plan:   Assessment and plan was discussed and formulated with my attending.

## 2013-12-15 NOTE — Progress Notes (Signed)
Patient ID: [name], female   DOB: 15-Sep-1940, 73 y.o.   MRN: [id number]  The patient did not wait to be seen or to do labs. She will be called again. Her leg edema could be due to amlodipine. We can reduce its dosage and add an ACEI. However, she had been discontinued of lisinopril because of hyperkalemia, and I would like to make sure her BMP is within normal limits. She does have previous history of DVT - thus she has higher risk of having it again.   Case discussed with Dr. Gordy Levan at the time of the visit.  We reviewed the resident's history and exam and pertinent patient test results.  I agree with the assessment, diagnosis, and plan of care documented in the resident's note.

## 2013-12-15 NOTE — Assessment & Plan Note (Signed)
-  discussed smoking cessation -Golden Hurter, CSW will check to see if the patches are available to her

## 2013-12-15 NOTE — Assessment & Plan Note (Signed)
Pt returns for LE bilateral swelling L>R.  She reports a h/o DVT in the right leg but "that was many years ago."  She reports trying to keep her legs elevated.  Does not want to use compression socks because "they are too tight and she has tried them before."  Also reports she does not eat a lot of salt.   -consider left LE dopplers given h/o DVT (Wells score is 5 which is high risk--pt also a smoker) -advised to limit sodium to 2000mg /day -advised compression socks may help as well as leg elevation -consider decreasing amlodipine and starting another agent

## 2013-12-15 NOTE — Assessment & Plan Note (Addendum)
-  would check renal US, SPEP, ANA, C3/C4, ANCA as there is no clear cause for the rise in SCr -consider referral to nephrology with CKDIII -instructed pt not to take any OTC meds such as aleve (naproxen), ibuprofen, etc.  -advised to discontinue cig smoking; reports she had success with the patches in the past -Golden Hurter with CSW will check to see what options are available for Medicare patients and NRT patches

## 2013-12-15 NOTE — Assessment & Plan Note (Addendum)
BP today 130/83.  Currently on amlodipine 10mg  but complains of LE swelling L>R.  She returns to the clinic today for evaluation of LE swelling that was present on her last OV.  She was supposed to come in and have labs drawn but she has not.  It was discussed to during a previous visit to decrease amlodipine to 5mg  and add lisinopril 5mg .  However, she has experienced problems with hyperkalemia in the past and this is why she was not on an ACEi/ARB.  She had to leave to catch the [place] bus and declined to have her labs drawn today--she agreed to come back to have this done. -she should return to the clinic in the next few days to address this issue -would check urine microalbumin and possibly renal US, SPEP, ANA, C3/C4 because it is unclear the reason for her CKD -also consider referral to nephrology CKDIII

## 2013-12-29 DIAGNOSIS — M19049 Primary osteoarthritis, unspecified hand: Secondary | ICD-10-CM | POA: Diagnosis not present

## 2013-12-29 DIAGNOSIS — G5600 Carpal tunnel syndrome, unspecified upper limb: Secondary | ICD-10-CM | POA: Diagnosis not present

## 2014-02-12 ENCOUNTER — Emergency Department (HOSPITAL_COMMUNITY)
Admission: EM | Admit: 2014-02-12 | Discharge: 2014-02-13 | Disposition: A | Discharge status: Home / Self Care | Payer: Medicare Other | Attending: Emergency Medicine | Admitting: Emergency Medicine

## 2014-02-12 ENCOUNTER — Emergency Department (HOSPITAL_COMMUNITY): Payer: Medicare Other

## 2014-02-12 ENCOUNTER — Encounter (HOSPITAL_COMMUNITY): Payer: Self-pay | Admitting: Emergency Medicine

## 2014-02-12 DIAGNOSIS — R062 Wheezing: Secondary | ICD-10-CM

## 2014-02-12 DIAGNOSIS — B349 Viral infection, unspecified: Secondary | ICD-10-CM | POA: Diagnosis not present

## 2014-02-12 DIAGNOSIS — N183 Chronic kidney disease, stage 3 (moderate): Secondary | ICD-10-CM | POA: Diagnosis not present

## 2014-02-12 DIAGNOSIS — R05 Cough: Secondary | ICD-10-CM | POA: Diagnosis present

## 2014-02-12 DIAGNOSIS — H540 Blindness, both eyes: Secondary | ICD-10-CM | POA: Diagnosis not present

## 2014-02-12 DIAGNOSIS — Z79899 Other long term (current) drug therapy: Secondary | ICD-10-CM | POA: Insufficient documentation

## 2014-02-12 DIAGNOSIS — H9190 Unspecified hearing loss, unspecified ear: Secondary | ICD-10-CM | POA: Diagnosis not present

## 2014-02-12 DIAGNOSIS — R069 Unspecified abnormalities of breathing: Secondary | ICD-10-CM | POA: Diagnosis not present

## 2014-02-12 DIAGNOSIS — Z720 Tobacco use: Secondary | ICD-10-CM | POA: Diagnosis not present

## 2014-02-12 DIAGNOSIS — M1990 Unspecified osteoarthritis, unspecified site: Secondary | ICD-10-CM | POA: Diagnosis not present

## 2014-02-12 DIAGNOSIS — I129 Hypertensive chronic kidney disease with stage 1 through stage 4 chronic kidney disease, or unspecified chronic kidney disease: Secondary | ICD-10-CM | POA: Insufficient documentation

## 2014-02-12 DIAGNOSIS — R059 Cough, unspecified: Secondary | ICD-10-CM

## 2014-02-12 DIAGNOSIS — Z86718 Personal history of other venous thrombosis and embolism: Secondary | ICD-10-CM | POA: Insufficient documentation

## 2014-02-12 DIAGNOSIS — I10 Essential (primary) hypertension: Secondary | ICD-10-CM | POA: Diagnosis not present

## 2014-02-12 DIAGNOSIS — I517 Cardiomegaly: Secondary | ICD-10-CM | POA: Diagnosis not present

## 2014-02-12 LAB — COMPREHENSIVE METABOLIC PANEL
ALBUMIN: 3.3 g/dL — AB (ref 3.5–5.2)
ALK PHOS: 113 U/L (ref 39–117)
ALT: 8 U/L (ref 0–35)
ANION GAP: 10 (ref 5–15)
AST: 13 U/L (ref 0–37)
BILIRUBIN TOTAL: 0.4 mg/dL (ref 0.3–1.2)
BUN: 16 mg/dL (ref 6–23)
CHLORIDE: 101 meq/L (ref 96–112)
CO2: 28 mEq/L (ref 19–32)
Calcium: 9.2 mg/dL (ref 8.4–10.5)
Creatinine, Ser: 1.33 mg/dL — ABNORMAL HIGH (ref 0.50–1.10)
GFR calc Af Amer: 45 mL/min — ABNORMAL LOW (ref >90)
GFR calc non Af Amer: 39 mL/min — ABNORMAL LOW (ref >90)
Glucose, Bld: 109 mg/dL — ABNORMAL HIGH (ref 70–99)
POTASSIUM: 3.7 meq/L (ref 3.7–5.3)
SODIUM: 139 meq/L (ref 137–147)
TOTAL PROTEIN: 6.8 g/dL (ref 6.0–8.3)

## 2014-02-12 LAB — CBC WITH DIFFERENTIAL/PLATELET
BASOS ABS: 0.1 10*3/uL (ref 0.0–0.1)
BASOS PCT: 1 % (ref 0–1)
EOS ABS: 0.2 10*3/uL (ref 0.0–0.7)
Eosinophils Relative: 3 % (ref 0–5)
HCT: 41.6 % (ref 36.0–46.0)
HEMOGLOBIN: 13.7 g/dL (ref 12.0–15.0)
Lymphocytes Relative: 28 % (ref 12–46)
Lymphs Abs: 1.7 10*3/uL (ref 0.7–4.0)
MCH: 31.9 pg (ref 26.0–34.0)
MCHC: 32.9 g/dL (ref 30.0–36.0)
MCV: 96.7 fL (ref 78.0–100.0)
Monocytes Absolute: 0.6 10*3/uL (ref 0.1–1.0)
Monocytes Relative: 10 % (ref 3–12)
NEUTROS ABS: 3.4 10*3/uL (ref 1.7–7.7)
NEUTROS PCT: 58 % (ref 43–77)
PLATELETS: 207 10*3/uL (ref 150–400)
RBC: 4.3 MIL/uL (ref 3.87–5.11)
RDW: 13.9 % (ref 11.5–15.5)
WBC: 5.9 10*3/uL (ref 4.0–10.5)

## 2014-02-12 LAB — PRO B NATRIURETIC PEPTIDE: Pro B Natriuretic peptide (BNP): 12.5 pg/mL (ref 0–125)

## 2014-02-12 MED ORDER — ALBUTEROL SULFATE HFA 108 (90 BASE) MCG/ACT IN AERS
2.0000 | INHALATION_SPRAY | Freq: Once | RESPIRATORY_TRACT | Status: DC
  Filled 2014-02-12: qty 6.7

## 2014-02-12 MED ORDER — PREDNISONE 20 MG PO TABS: ORAL_TABLET | ORAL | Status: DC

## 2014-02-12 MED ORDER — PREDNISONE 20 MG PO TABS
60.0000 mg | ORAL_TABLET | Freq: Once | ORAL | Status: AC
  Administered 2014-02-12: 60 mg via ORAL
  Filled 2014-02-12: qty 3

## 2014-02-12 MED ORDER — ALBUTEROL SULFATE (2.5 MG/3ML) 0.083% IN NEBU
5.0000 mg | INHALATION_SOLUTION | Freq: Once | RESPIRATORY_TRACT | Status: AC
  Administered 2014-02-12: 5 mg via RESPIRATORY_TRACT
  Filled 2014-02-12: qty 6

## 2014-02-12 NOTE — Discharge Instructions (Signed)
Please call your doctor for a followup appointment within 24-48 hours. When you talk to your doctor please let them know that you were seen in the emergency department and have them acquire all of your records so that they can discuss the findings with you and formulate a treatment plan to fully care for your new and ongoing problems. Please call and set-up an appointment with your primary care provider to be seen and re-assessed  Please rest and stay hydrated  Please take medications as prescribed Please avoid any physical or strenuous activity Please continue to monitor symptoms closely and if symptoms are to worsen or change (fever greater than 101, chills, sweating, nausea, vomiting, chest pain, shortness of breathe, difficulty breathing, weakness, numbness, tingling, worsening or changes to pain pattern, throat sensation, worsening or changes to cough, coughing up blood, neck pain) please report back to the Emergency Department immediately.    Cough, Adult  A cough is a reflex that helps clear your throat and airways. It can help heal the body or may be a reaction to an irritated airway. A cough may only last 2 or 3 weeks (acute) or may last more than 8 weeks (chronic).  CAUSES Acute cough:  Viral or bacterial infections. Chronic cough:  Infections.  Allergies.  Asthma.  Post-nasal drip.  Smoking.  Heartburn or acid reflux.  Some medicines.  Chronic lung problems (COPD).  Cancer. SYMPTOMS   Cough.  Fever.  Chest pain.  Increased breathing rate.  High-pitched whistling sound when breathing (wheezing).  Colored mucus that you cough up (sputum). TREATMENT   A bacterial cough may be treated with antibiotic medicine.  A viral cough must run its course and will not respond to antibiotics.  Your caregiver may recommend other treatments if you have a chronic cough. HOME CARE INSTRUCTIONS   Only take over-the-counter or prescription medicines for pain, discomfort,  or fever as directed by your caregiver. Use cough suppressants only as directed by your caregiver.  Use a cold steam vaporizer or humidifier in your bedroom or home to help loosen secretions.  Sleep in a semi-upright position if your cough is worse at night.  Rest as needed.  Stop smoking if you smoke. SEEK IMMEDIATE MEDICAL CARE IF:   You have pus in your sputum.  Your cough starts to worsen.  You cannot control your cough with suppressants and are losing sleep.  You begin coughing up blood.  You have difficulty breathing.  You develop pain which is getting worse or is uncontrolled with medicine.  You have a fever. MAKE SURE YOU:   Understand these instructions.  Will watch your condition.  Will get help right away if you are not doing well or get worse. Document Released: 10/25/2010 Document Revised: 07/21/2011 Document Reviewed: 10/25/2010 [facility] [employer] Patient Information 2015 [place], [place]. This information is not intended to replace advice given to you by your health care provider. Make sure you discuss any questions you have with your health care provider.

## 2014-02-12 NOTE — ED Provider Notes (Signed)
Medical screening examination/treatment/procedure(s) were conducted as a shared visit with non-physician practitioner(s) and myself.  I personally evaluated the patient during the encounter.   EKG Interpretation None     Patient encouraged to quit smoking today, she has diffuse wheezes she has used an inhaler in the past although she does not have a formal previous diagnosis of COPD or asthma, patient wants discharge pulse oximetry is normal on room air she does have diffuse wheezes but speaks in sentences without difficulty at rest.  Babette Relic, MD 02/13/14 1757

## 2014-02-12 NOTE — ED Notes (Signed)
Patient transported to X-ray 

## 2014-02-12 NOTE — ED Notes (Signed)
Ptar Called.

## 2014-02-12 NOTE — ED Notes (Signed)
Message left on voicemail to inform [name] that pt has been medically cleared and will be coming back to her apartment.

## 2014-02-12 NOTE — ED Provider Notes (Signed)
CSN: [id number]     Arrival date & time 02/12/14  1940 History   First MD Initiated Contact with Patient 02/12/14 2047     Chief Complaint  Patient presents with  . Cough     (Consider location/radiation/quality/duration/timing/severity/associated sxs/prior Treatment) The history is provided by the patient. No language interpreter was used.  [name] is a 73 y/o F with PMHx of HTN, hearing loss, blindness, arthritis, DVT, phlebitis, chronic kidney disease presenting to the ED with cough that has been ongoing for the past 2-3 days. Patient reported that she has been coughing up phlegm described as a greenish color. Stated that she has been experiencing nasal congestion with greenish congestion. Stated that she has not been using anything over the counter. Reported that she lives in an independent living facility. Denied fever, chills, nausea, vomiting, diarrhea, comp pain, blurred vision, sudden loss of vision, neck pain, neck stiffness, sore throat, difficulty swallowing, ear pain, chest pain, shortness of breath, difficulty breathing, difficulty swallowing. Patient does smoke cigarettes. PCP Dr. Ellwood Dense  Past Medical History  Diagnosis Date  . Hypertension   . HEARING LOSS   . 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 Arthritis   . DVT (deep venous thrombosis)   . Phlebitis   . CKD (chronic kidney disease) stage 3, GFR 30-59 ml/min    Past Surgical History  Procedure Laterality Date  . Eye surgery    . Shoulder open rotator cuff repair  1973    right shoulder, secondary numbness in R hand  . Foot surgery    . Right wrist     Family History  Problem Relation Age of Onset  . Cancer Maternal Uncle     colon  . Cancer Maternal Grandmother     liver  . Cancer Mother     liver  . Cancer Brother     Unknown  . Cancer Sister     Unknown   History  Substance Use Topics  . Smoking status: Current Every Day Smoker -- 0.50 packs/day for 20 years     Types: Cigarettes  . Smokeless tobacco: Never Used     Comment: CUTTING BACK/ 8 CIGARETTES  . Alcohol Use: No   OB History   Grav Para Term Preterm Abortions TAB SAB Ect Mult Living                 Review of Systems  Constitutional: Negative for fever and chills.  HENT: Negative for sore throat and trouble swallowing.   Eyes: Negative for visual disturbance.  Respiratory: Positive for cough. Negative for chest tightness and shortness of breath.   Cardiovascular: Negative for chest pain.  Gastrointestinal: Negative for nausea, vomiting, abdominal pain and diarrhea.  Musculoskeletal: Negative for back pain, neck pain and neck stiffness.  Neurological: Negative for dizziness and weakness.      Allergies  Codeine and Morphine  Home Medications   Prior to Admission medications   Medication Sig Start Date End Date Taking? Authorizing Provider  amLODipine (NORVASC) 10 MG tablet Take 1 tablet (10 mg total) by mouth daily.  11/30/14 Yes [name], MD  predniSONE (DELTASONE) 20 MG tablet 3 tabs po day one, then 2 tabs daily x 4 days 02/12/14   Lareta Bruneau, PA-C   BP 136/62  Pulse 80  Temp(Src) 98.6 F (37 C) (Oral)  Resp 20  SpO2 95% Physical Exam  Nursing note and vitals reviewed. Constitutional: She is oriented  to person, place, and time. She appears well-developed and well-nourished. No distress.  HENT:  Head: Normocephalic and atraumatic.  Mouth/Throat: Oropharynx is clear and moist. No oropharyngeal exudate.  Eyes: Conjunctivae and EOM are normal. Pupils are equal, round, and reactive to light. Right eye exhibits no discharge. Left eye exhibits no discharge.  Neck: Normal range of motion. Neck supple. No tracheal deviation present.  Cardiovascular: Normal rate, regular rhythm and normal heart sounds.  Exam reveals no friction rub.   No murmur heard. Cap refill < 3 seconds Mild swelling noted to the lower extremities bilaterally with 1+ pitting edema from the  dorsal aspect of the feet to mid-shin bilaterally   Pulmonary/Chest: Effort normal. No respiratory distress. She has wheezes (expiratory wheezes identified to the upper and lower lobes bilaterally). She has no rales. She exhibits no tenderness.  Patient is able to speak in full sentences without difficulty Negative use of accessory muscles Negative stridor  Musculoskeletal: Normal range of motion.  Full ROM to upper and lower extremities without difficulty noted, negative ataxia noted.  Lymphadenopathy:    She has no cervical adenopathy.  Neurological: She is alert and oriented to person, place, and time. No cranial nerve deficit. She exhibits normal muscle tone. Coordination normal.  Cranial nerves III-XII grossly intact Equal grip strength  Negative facial drooping Negative slurred speech Negative aphasia Patient able to respond to questions appropriately Patient follows commands well  Skin: Skin is warm and dry. No rash noted. She is not diaphoretic. No erythema.  Psychiatric: She has a normal mood and affect. Her behavior is normal. Thought content normal.    ED Course  Procedures (including critical care time)  9:14 PM Patient was seen and assessed by attending physician, Dr. Rebeca Alert. As per physician, agreed with albuterol. Recommended prednisone 60 mg to be given. Reported that patient can be discharged home with albuterol and prednisone and for patient to follow-up as an outpatient.   Results for orders placed during the hospital encounter of 02/12/14  CBC WITH DIFFERENTIAL      Result Value Ref Range   WBC 5.9  4.0 - 10.5 K/uL   RBC 4.30  3.87 - 5.11 MIL/uL   Hemoglobin 13.7  12.0 - 15.0 g/dL   HCT 41.6  36.0 - 46.0 %   MCV 96.7  78.0 - 100.0 fL   MCH 31.9  26.0 - 34.0 pg   MCHC 32.9  30.0 - 36.0 g/dL   RDW 13.9  11.5 - 15.5 %   Platelets 207  150 - 400 K/uL   Neutrophils Relative % 58  43 - 77 %   Neutro Abs 3.4  1.7 - 7.7 K/uL   Lymphocytes Relative 28  12 - 46 %    Lymphs Abs 1.7  0.7 - 4.0 K/uL   Monocytes Relative 10  3 - 12 %   Monocytes Absolute 0.6  0.1 - 1.0 K/uL   Eosinophils Relative 3  0 - 5 %   Eosinophils Absolute 0.2  0.0 - 0.7 K/uL   Basophils Relative 1  0 - 1 %   Basophils Absolute 0.1  0.0 - 0.1 K/uL  COMPREHENSIVE METABOLIC PANEL      Result Value Ref Range   Sodium 139  137 - 147 mEq/L   Potassium 3.7  3.7 - 5.3 mEq/L   Chloride 101  96 - 112 mEq/L   CO2 28  19 - 32 mEq/L   Glucose, Bld 109 (*) 70 - 99 mg/dL  BUN 16  6 - 23 mg/dL   Creatinine, Ser 1.33 (*) 0.50 - 1.10 mg/dL   Calcium 9.2  8.4 - 10.5 mg/dL   Total Protein 6.8  6.0 - 8.3 g/dL   Albumin 3.3 (*) 3.5 - 5.2 g/dL   AST 13  0 - 37 U/L   ALT 8  0 - 35 U/L   Alkaline Phosphatase 113  39 - 117 U/L   Total Bilirubin 0.4  0.3 - 1.2 mg/dL   GFR calc non Af Amer 39 (*) >90 mL/min   GFR calc Af Amer 45 (*) >90 mL/min   Anion gap 10  5 - 15  PRO B NATRIURETIC PEPTIDE      Result Value Ref Range   Pro B Natriuretic peptide (BNP) 12.5  0 - 125 pg/mL    Labs Review Labs Reviewed  COMPREHENSIVE METABOLIC PANEL - Abnormal; Notable for the following:    Glucose, Bld 109 (*)    Creatinine, Ser 1.33 (*)    Albumin 3.3 (*)    GFR calc non Af Amer 39 (*)    GFR calc Af Amer 45 (*)    All other components within normal limits  CBC WITH DIFFERENTIAL  PRO B NATRIURETIC PEPTIDE    Imaging Review Dg Chest 2 View (if Patient Has Fever And/or Copd)  02/12/2014   CLINICAL DATA:  Cough and congestion for 3 days.  Smoker.  EXAM: CHEST  2 VIEW  COMPARISON:  PA and lateral chest 07/22/2009.  FINDINGS: There is cardiomegaly. Coarsening of the pulmonary interstitium is identified. No consolidative process, pneumothorax or effusion.  IMPRESSION: No acute disease.  Cardiomegaly.  Coarsened appearance of the pulmonary interstitium compatible with chronic change.   Electronically Signed   By: Inge Rise M.D.   On: 02/12/2014 20:11     EKG Interpretation None      MDM    Final diagnoses:  Cough  Wheezing  Viral illness    Medications  albuterol (PROVENTIL HFA;VENTOLIN HFA) 108 (90 BASE) MCG/ACT inhaler 2 puff (not administered)  albuterol (PROVENTIL) (2.5 MG/3ML) 0.083% nebulizer solution 5 mg (5 mg Nebulization Given 02/12/14 2134)  predniSONE (DELTASONE) tablet 60 mg (60 mg Oral Given 02/12/14 2123)   Filed Vitals:   02/12/14 1942 02/12/14 1943 02/12/14 2134 02/12/14 2237  BP:  161/71  136/62  Pulse:  80  80  Temp:  98.6 F (37 C)    TempSrc:  Oral    Resp:  20    SpO2: 98% 98% 95% 95%   CBC unremarkable. CMP unremarkable, mild elevation creatinine of 1.33 - when compared to previous labs patient's creatinine has been as high as 1.36 - patient has history chronic kidney disease. BNP negative elevation. Chest x-ray negative for acute cardiopulmonary disease-mild cardiomegaly identified. Doubt pneumonia. Doubt CHF. Cannot rule out possible COPD secondary to patient having history of smoking versus viral illness. Patient responded well to albuterol nebulizer treatment - wheezing has improved. Patient reported that she is feeling well and would like to be discharged home. Patient seen and assessed by attending physician, Dr. Rebeca Alert who agreed to plan of discharge. Recommended patient be discharged with albuterol inhaler and prednisone. Patient stable, afebrile. Patient not septic appearing. Negative hypoxemia. Negative signs of respiratory distress. Discharged patient. Discharged patient with prednisone and albuterol inhaler. Discussed with patient smoking cessation. Discussed with patient to rest and stay hydrated. Referred patient to PCP. Discussed with patient to closely monitor symptoms and if symptoms are to worsen  or change to report back to the ED - strict return instructions given.  Patient agreed to plan of care, understood, all questions answered.   Jamse Mead, PA-C 02/12/14 2316

## 2014-02-12 NOTE — ED Notes (Signed)
Respiratory called.

## 2014-02-12 NOTE — ED Notes (Signed)
Bed: WJ19 Expected date:  Expected time:  Means of arrival:  Comments: EMS 73 yo female with shortness of breath

## 2014-02-12 NOTE — ED Notes (Signed)
Per EMS, Pt from independent living. Pt has had a cough and congestion x 3 days. Pt sts "I feel like I just can't breathe." When EMS arrived, pt had heat set to 89 degrees F.  A&Ox4. Pt ambulated to and from bed. Pt legally blind and has some hearing problems.

## 2014-02-13 DIAGNOSIS — J069 Acute upper respiratory infection, unspecified: Secondary | ICD-10-CM | POA: Diagnosis not present

## 2014-02-13 DIAGNOSIS — R05 Cough: Secondary | ICD-10-CM | POA: Diagnosis not present

## 2014-02-17 ENCOUNTER — Ambulatory Visit (INDEPENDENT_AMBULATORY_CARE_PROVIDER_SITE_OTHER): Payer: Medicare Other | Admitting: Internal Medicine

## 2014-02-17 ENCOUNTER — Encounter: Payer: Self-pay | Admitting: Internal Medicine

## 2014-02-17 VITALS — BP 132/72 | HR 95 | Temp 97.5°F | Ht 63.0 in | Wt 175.7 lb

## 2014-02-17 DIAGNOSIS — I10 Essential (primary) hypertension: Secondary | ICD-10-CM

## 2014-02-17 DIAGNOSIS — Z720 Tobacco use: Secondary | ICD-10-CM

## 2014-02-17 DIAGNOSIS — R059 Cough, unspecified: Secondary | ICD-10-CM | POA: Insufficient documentation

## 2014-02-17 DIAGNOSIS — F17200 Nicotine dependence, unspecified, uncomplicated: Secondary | ICD-10-CM

## 2014-02-17 DIAGNOSIS — B370 Candidal stomatitis: Secondary | ICD-10-CM | POA: Diagnosis not present

## 2014-02-17 DIAGNOSIS — K6289 Other specified diseases of anus and rectum: Secondary | ICD-10-CM

## 2014-02-17 DIAGNOSIS — Z0000 Encounter for general adult medical examination without abnormal findings: Secondary | ICD-10-CM

## 2014-02-17 DIAGNOSIS — R05 Cough: Secondary | ICD-10-CM | POA: Insufficient documentation

## 2014-02-17 MED ORDER — NYSTATIN 100000 UNIT/ML MT SUSP: 5.0000 mL | Freq: Four times a day (QID) | OROMUCOSAL | Status: DC

## 2014-02-17 NOTE — Assessment & Plan Note (Signed)
Likely 2/2 recent prednisone use.   -nystatin swish and swallow prescribed

## 2014-02-17 NOTE — Patient Instructions (Addendum)
General Instructions:  Please bring your medicines with you each time you come to clinic.  Medicines may include prescription medications, over-the-counter medications, herbal remedies, eye drops, vitamins, or other pills.  Please apply the ointment to your wound at least once a day and try to keep the area dry and clean as much as possible. Avoid harsh irritants and consider changing the cleaning agent in your bath tub.   PLEASE STOP SMOKING!!!  Try the nystatin swish and swallow up to 4 times a day and use the albuterol inhaler up to 4 times a day as needed  Consider over the counter cough syrup without codeine if needed  Follow up with your pcp  Progress Toward Treatment Goals:  Treatment Goal 02/17/2014  Blood pressure at goal  Stop smoking smoking the same amount    Self Care Goals & Plans:  Self Care Goal 02/17/2014  Manage my medications -  Eat healthy foods -  Be physically active -  Stop smoking call [facility] (1-800-QUIT-NOW)    No flowsheet data found.   Care Management & Community Referrals:  Referral 11/04/2012  Referrals made for care management support none needed

## 2014-02-17 NOTE — Assessment & Plan Note (Signed)
Likely related to smoking and underlying copd. Recent worsening last week when seen in ED but now back to baseline. Baseline wheezing on physical exam today but not in any respiratory distress or SOB.   -advised to quit smoking -she does not want any cough syrup at this time -consider PFTs -continue albuterol inhaler prn, advised she only needs to take when necessary -was on spiriva in the past, could consider restarting

## 2014-02-17 NOTE — Assessment & Plan Note (Signed)
BP Readings from Last 3 Encounters:  02/17/14 132/72  02/12/14 136/62  12/15/13 130/83   Lab Results  Component Value Date   NA 139 02/12/2014   K 3.7 02/12/2014   CREATININE 1.33* 02/12/2014   Assessment: Blood pressure control: controlled Progress toward BP goal:  at goal  Plan: Medications:  continue current medications norvasc 10mg  daily

## 2014-02-17 NOTE — Assessment & Plan Note (Signed)
Tip of anal crease, slight blistering, skin tears no drainage or inflammation but pink, moist, and tender.   -likely secondary to irritation or improper wiping. She says they have switched the bathtub cleaner at her home and since then the area has been irritated.  -advised to keep the area clean and dry as much as possible -prn bacitracin ointment provided for relief -avoid harsh chemicals to area -picture placed in chart for reference, no open blistering for HSV swab at this time

## 2014-02-17 NOTE — Assessment & Plan Note (Signed)
  Assessment: Progress toward smoking cessation:  smoking the same amount Barriers to progress toward smoking cessation:    Comments: 1/2ppd, interested in quitting one day but not interested in starting at this time  Plan: Instruction/counseling given:  I counseled patient on the dangers of tobacco use, advised patient to stop smoking, and reviewed strategies to maximize success. Educational resources provided:    Self management tools provided:    Medications to assist with smoking cessation:  None Patient agreed to the following self-care plans for smoking cessation: call [facility] (1-800-QUIT-NOW)  Other plans: advised to consider patches

## 2014-02-17 NOTE — Progress Notes (Signed)
Subjective:   Patient ID: [name] female   DOB: 04-16-1941 73 y.o.   MRN: [id number]  HPI: [name] is a 73 y.o. female with HTN, chronic current smoker, and CKD3 presenting to opc today for ED follow up visit. She was seen at [facility] ED 10/4 for productive cough and wheezing. Thought to likely have underlying COPD and prescribed prednisone course and albuterol inhaler. She received 60mg  po dose of prednisone x1 in ED but did not get the 4 day prescription filled on discharge.  She says the albuterol has helped tremendously with her breathing and she feels back to baseline now with her baseline cough. She does not wish to take the prednisone anymore as she feels better. She does endorse white stuff on her tongue that she says started since using the inhaler that she uses no more than 6 hours a day. She does report being on advair in the past but on epic says spiriva. She does not know why that was stopped.   Of note, continues to smoke ~1/2ppd, interested in quitting but not ready yet.   Her main complaint today is skin breakdown on her buttocks that she accounts to cleaning agent of her bathtub recently.    Past Medical History  Diagnosis Date  . Hypertension   . HEARING LOSS   . 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 Arthritis   . DVT (deep venous thrombosis)   . Phlebitis   . CKD (chronic kidney disease) stage 3, GFR 30-59 ml/min    Current Outpatient Prescriptions  Medication Sig Dispense Refill  . amLODipine (NORVASC) 10 MG tablet Take 1 tablet (10 mg total) by mouth daily.  30 tablet  0  . predniSONE (DELTASONE) 20 MG tablet 3 tabs po day one, then 2 tabs daily x 4 days  11 tablet  0  . [DISCONTINUED] enoxaparin (LOVENOX) 80 MG/0.8ML SOLN injection Inject 0.8 mLs (80 mg total) into the skin every 12 (twelve) hours.  8 mL  0   No current facility-administered medications for this visit.   Family History  Problem Relation Age of Onset  .  Cancer Maternal Uncle     colon  . Cancer Maternal Grandmother     liver  . Cancer Mother     liver  . Cancer Brother     Unknown  . Cancer Sister     Unknown   History   Social History  . Marital Status: Divorced    Spouse Name: N/A    Number of Children: N/A  . Years of Education: N/A   Social History Main Topics  . Smoking status: Current Every Day Smoker -- 0.50 packs/day for 20 years    Types: Cigarettes  . Smokeless tobacco: Never Used     Comment: CUTTING BACK/ 8 CIGARETTES  . Alcohol Use: No  . Drug Use: No  . Sexual Activity: No   Other Topics Concern  . None   Social History Narrative   Patient currently lives at home alone, but does have a home worker who visits her throughout the week to help with things around the house.  Patient states that she still tries to remain active and walks daily.  She is has been divorced for sometime and has one son.  She currently lives off social security.  Patient reports using approximately half a pack of cigarettes daily for 20 years.  She denies any alcohol or illicit  drug use.  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     Review of Systems:  Constitutional:  Denies fever, chills  HEENT:  Thrush, hx of eye surgery  Respiratory:  Intermittent cough and wheezing  Cardiovascular:  Denies chest pain  Gastrointestinal:  Denies nausea, vomiting, abdominal pain   Genitourinary:  Denies dysuria   Musculoskeletal:  Denies myalgias, back pain, joint swelling, arthralgias and gait problem.   Skin:  Wound buttocks  Neurological:  Denies dizziness    Objective:  Physical Exam: Filed Vitals:   02/17/14 1443  BP: 132/72  Pulse: 95  Temp: 97.5 F (36.4 C)  TempSrc: Oral  Height: 5\' 3"  (1.6 m)  Weight: 175 lb 11.2 oz (79.697 kg)  SpO2: 92%   Vitals reviewed. General: sitting on examination table, NAD, hard of hearing HEENT: EOMI, noted horizontal nystagmus,  appears cross eyed, +thrush Cardiac: RRR, ?gallop Pulm: diffuse expiratory wheezing, anterior worse than posterior Abd: soft, nontender Ext: warm and well perfused, b/l lower extremity edema, non-pitting, left leg?right, difficult to appreciate DP's, long hardened toe nails Neuro: alert and oriented X3, strength and sensation grossly intact Skin: skin breakdown area top of anal crease with small region of blisters on left buttock as per picture below. Pink, no visible drainage, tender to palpation     Assessment & Plan:  Discussed with Dr. Lynnae January Bacitracin ointment for skin breakdown prn Nystatin swish and swallow for thrush

## 2014-02-17 NOTE — Assessment & Plan Note (Signed)
Refused flu vaccine today

## 2014-02-20 ENCOUNTER — Other Ambulatory Visit: Payer: Self-pay

## 2014-02-20 ENCOUNTER — Inpatient Hospital Stay (HOSPITAL_COMMUNITY)
Admission: EM | Admit: 2014-02-20 | Discharge: 2014-02-21 | DRG: 190 | Disposition: A | Discharge status: Home / Self Care | Payer: Medicare Other | Attending: Oncology | Admitting: Oncology

## 2014-02-20 ENCOUNTER — Emergency Department (HOSPITAL_COMMUNITY): Payer: Medicare Other

## 2014-02-20 ENCOUNTER — Encounter (HOSPITAL_COMMUNITY): Payer: Self-pay | Admitting: Emergency Medicine

## 2014-02-20 DIAGNOSIS — R918 Other nonspecific abnormal finding of lung field: Secondary | ICD-10-CM | POA: Diagnosis not present

## 2014-02-20 DIAGNOSIS — Z720 Tobacco use: Secondary | ICD-10-CM | POA: Diagnosis not present

## 2014-02-20 DIAGNOSIS — N183 Chronic kidney disease, stage 3 (moderate): Secondary | ICD-10-CM | POA: Diagnosis present

## 2014-02-20 DIAGNOSIS — H9190 Unspecified hearing loss, unspecified ear: Secondary | ICD-10-CM | POA: Diagnosis present

## 2014-02-20 DIAGNOSIS — R0602 Shortness of breath: Secondary | ICD-10-CM | POA: Diagnosis not present

## 2014-02-20 DIAGNOSIS — M545 Low back pain: Secondary | ICD-10-CM | POA: Diagnosis present

## 2014-02-20 DIAGNOSIS — J440 Chronic obstructive pulmonary disease with acute lower respiratory infection: Principal | ICD-10-CM | POA: Diagnosis present

## 2014-02-20 DIAGNOSIS — J189 Pneumonia, unspecified organism: Secondary | ICD-10-CM | POA: Diagnosis present

## 2014-02-20 DIAGNOSIS — I10 Essential (primary) hypertension: Secondary | ICD-10-CM | POA: Diagnosis not present

## 2014-02-20 DIAGNOSIS — B370 Candidal stomatitis: Secondary | ICD-10-CM | POA: Diagnosis present

## 2014-02-20 DIAGNOSIS — Z7952 Long term (current) use of systemic steroids: Secondary | ICD-10-CM

## 2014-02-20 DIAGNOSIS — Z947 Corneal transplant status: Secondary | ICD-10-CM | POA: Diagnosis not present

## 2014-02-20 DIAGNOSIS — I129 Hypertensive chronic kidney disease with stage 1 through stage 4 chronic kidney disease, or unspecified chronic kidney disease: Secondary | ICD-10-CM | POA: Diagnosis present

## 2014-02-20 DIAGNOSIS — F17210 Nicotine dependence, cigarettes, uncomplicated: Secondary | ICD-10-CM | POA: Diagnosis present

## 2014-02-20 DIAGNOSIS — G8929 Other chronic pain: Secondary | ICD-10-CM | POA: Diagnosis present

## 2014-02-20 DIAGNOSIS — M810 Age-related osteoporosis without current pathological fracture: Secondary | ICD-10-CM | POA: Diagnosis present

## 2014-02-20 DIAGNOSIS — M47816 Spondylosis without myelopathy or radiculopathy, lumbar region: Secondary | ICD-10-CM | POA: Diagnosis not present

## 2014-02-20 DIAGNOSIS — Z86718 Personal history of other venous thrombosis and embolism: Secondary | ICD-10-CM | POA: Diagnosis not present

## 2014-02-20 DIAGNOSIS — R069 Unspecified abnormalities of breathing: Secondary | ICD-10-CM | POA: Diagnosis not present

## 2014-02-20 DIAGNOSIS — I517 Cardiomegaly: Secondary | ICD-10-CM | POA: Diagnosis not present

## 2014-02-20 DIAGNOSIS — Z79899 Other long term (current) drug therapy: Secondary | ICD-10-CM

## 2014-02-20 DIAGNOSIS — E876 Hypokalemia: Secondary | ICD-10-CM | POA: Diagnosis present

## 2014-02-20 DIAGNOSIS — I872 Venous insufficiency (chronic) (peripheral): Secondary | ICD-10-CM | POA: Diagnosis present

## 2014-02-20 DIAGNOSIS — J449 Chronic obstructive pulmonary disease, unspecified: Secondary | ICD-10-CM | POA: Diagnosis present

## 2014-02-20 DIAGNOSIS — R0902 Hypoxemia: Secondary | ICD-10-CM | POA: Diagnosis not present

## 2014-02-20 LAB — PRO B NATRIURETIC PEPTIDE: PRO B NATRI PEPTIDE: 31.7 pg/mL (ref 0–125)

## 2014-02-20 LAB — BASIC METABOLIC PANEL
Anion gap: 15 (ref 5–15)
BUN: 11 mg/dL (ref 6–23)
CALCIUM: 9.5 mg/dL (ref 8.4–10.5)
CO2: 28 meq/L (ref 19–32)
Chloride: 97 mEq/L (ref 96–112)
Creatinine, Ser: 1.31 mg/dL — ABNORMAL HIGH (ref 0.50–1.10)
GFR calc Af Amer: 46 mL/min — ABNORMAL LOW (ref >90)
GFR calc non Af Amer: 39 mL/min — ABNORMAL LOW (ref >90)
GLUCOSE: 164 mg/dL — AB (ref 70–99)
Potassium: 3 mEq/L — ABNORMAL LOW (ref 3.7–5.3)
SODIUM: 140 meq/L (ref 137–147)

## 2014-02-20 LAB — TROPONIN I

## 2014-02-20 LAB — CBC
HEMATOCRIT: 42.5 % (ref 36.0–46.0)
HEMOGLOBIN: 13.9 g/dL (ref 12.0–15.0)
MCH: 31.9 pg (ref 26.0–34.0)
MCHC: 32.7 g/dL (ref 30.0–36.0)
MCV: 97.5 fL (ref 78.0–100.0)
Platelets: 202 10*3/uL (ref 150–400)
RBC: 4.36 MIL/uL (ref 3.87–5.11)
RDW: 13.3 % (ref 11.5–15.5)
WBC: 7.5 10*3/uL (ref 4.0–10.5)

## 2014-02-20 LAB — PROCALCITONIN: Procalcitonin: <0.1 ng/mL

## 2014-02-20 MED ORDER — ALBUTEROL SULFATE (2.5 MG/3ML) 0.083% IN NEBU: 2.5000 mg | INHALATION_SOLUTION | RESPIRATORY_TRACT | Status: DC | PRN

## 2014-02-20 MED ORDER — AZITHROMYCIN 500 MG IV SOLR
500.0000 mg | INTRAVENOUS | Status: DC
  Administered 2014-02-20: 500 mg via INTRAVENOUS
  Filled 2014-02-20: qty 500

## 2014-02-20 MED ORDER — DEXTROSE 5 % IV SOLN
1.0000 g | INTRAVENOUS | Status: DC
  Administered 2014-02-20: 1 g via INTRAVENOUS
  Filled 2014-02-20: qty 10

## 2014-02-20 NOTE — ED Notes (Signed)
MD at bedside. 

## 2014-02-20 NOTE — ED Notes (Signed)
Pt from home. Reports one week history of lumbar/thoracic back pain. Also reports one week history of sob and productive cough with green sputum. EMS gave a total of 10mg  albuterol and 0.5 atrovent nebulizer, and 125mg  solu-medrol. Pt reports she feels better after breathing tx. Pt speaking in complete sentences.

## 2014-02-20 NOTE — ED Notes (Signed)
Bed: EX52 Expected date:  Expected time:  Means of arrival:  Comments: EMS-back pain/SOB

## 2014-02-20 NOTE — ED Notes (Signed)
Pt given turkey sandwich and gingerale. 

## 2014-02-20 NOTE — Progress Notes (Signed)
Internal Medicine Clinic Attending  Case discussed with Dr. Qureshi soon after the resident saw the patient.  We reviewed the resident's history and exam and pertinent patient test results.  I agree with the assessment, diagnosis, and plan of care documented in the resident's note. 

## 2014-02-20 NOTE — ED Notes (Signed)
Will draw labs when pt returns from x-ray

## 2014-02-20 NOTE — H&P (Signed)
Date: 02/21/2014               Patient Name:  [name] MRN: [id number]  DOB: 1941/03/05 Age / Sex: 73 y.o., female   PCP: [name], MD              Medical Service: Internal Medicine Teaching Service              Attending Physician: Dr. Annia Belt, MD    First Contact: Dr. Marvel Plan Pager: [phone]  Second Contact: Dr. Heber Aragon Pager: [phone]            After Hours (After 5p/  First Contact Pager: [phone]  weekends / holidays): Second Contact Pager: [phone]   Chief Complaint:  Shortness of breath  History of Present Illness: [name] is a 73 year old woman with history of HTN, arthritis, DVT, CKD stage 3, and tobacco abuse presenting with shortness of breath.  [name] reports presenting to the [facility] ER for lower back pain and shortness of breath and cough for more than a week.  She was originally seen in the [facility] ED on 10/4 for productive cough and wheezing.  She was though to have underlying COPD and was prescribed a course of prednisone and an albuterol inhaler, but she did not fill the prednisone on discharge.  She was seen in [facility] on 02/17/14 and reported that the albuterol inhaler had helped, and she her breathing was back at her baseline.  However, she reports that her cough has continued since then and is productive of green sputum.  Today, she became acutely short of breath and called EMS.  EMS reported significant wheezing upon arrival and gave her Sulmedrol 105 mg IV, albuterol, and Atrovent during transport with significant improvement in her breathing.  She denies fevers, chest pain, or hemoptysis.  She was on Spiriva and Advair in 2012, but she does not know why they were stopped.  She also complains of low back pain for more than a week that is sharp and constant.  It is worse with movement, palpation, and coughing.  She denies any recent falls, radiation of the pain, numbness, tingling, weakness, or incontinence.  In the ER, she  was initially satting 98% on room air, but desaturated to 88% and was started on 2L nasal canula.  Chest x-ray showed possible interstitial edema vs. atypical pneumonia and a confluent airspace opacity in the lingula concerning for pneumonia.  Lumbar spine x-ray showed degenerative facet disease and remote compression fractures.  She was started on ceftriaxone and azithromycin.  Review of Systems: Review of Systems  Constitutional: Positive for malaise/fatigue. Negative for fever, chills, weight loss and diaphoresis.  HENT: Positive for congestion. Negative for sore throat.   Eyes: Negative for blurred vision.  Respiratory: Positive for cough, sputum production, shortness of breath and wheezing. Negative for hemoptysis.   Cardiovascular: Positive for orthopnea and leg swelling. Negative for chest pain, palpitations and claudication.  Gastrointestinal: Negative for heartburn, nausea, vomiting, abdominal pain, diarrhea and constipation.  Genitourinary: Negative for dysuria.  Musculoskeletal: Positive for back pain and myalgias. Negative for falls and joint pain.  Skin: Negative for itching and rash.  Neurological: Negative for dizziness, sensory change, focal weakness, weakness and headaches.    Meds: Medications Prior to Admission  Medication Sig Dispense Refill  . albuterol (PROVENTIL HFA;VENTOLIN HFA) 108 (90 BASE) MCG/ACT inhaler Inhale 2 puffs into the lungs every 6 (six) hours as needed for wheezing or shortness of  breath.      Marland Kitchen amLODipine (NORVASC) 10 MG tablet Take 1 tablet (10 mg total) by mouth daily.  30 tablet  0  . nystatin (MYCOSTATIN) 100000 UNIT/ML suspension Take 5 mLs (500,000 Units total) by mouth 4 (four) times daily.  60 mL  0   Current Facility-Administered Medications  Medication Dose Route Frequency Provider Last Rate Last Dose  . acetaminophen (TYLENOL) tablet 650 mg  650 mg Oral Q6H PRN Jones Bales, MD       Or  . acetaminophen (TYLENOL) suppository 650 mg   650 mg Rectal Q6H PRN Jones Bales, MD      . albuterol (PROVENTIL) (2.5 MG/3ML) 0.083% nebulizer solution 2.5 mg  2.5 mg Nebulization Q2H PRN Jones Bales, MD      . amLODipine (NORVASC) tablet 10 mg  10 mg Oral Daily Jones Bales, MD      . heparin injection 5,000 Units  5,000 Units Subcutaneous 3 times per day Jones Bales, MD      . ipratropium-albuterol (DUONEB) 0.5-2.5 (3) MG/3ML nebulizer solution 3 mL  3 mL Nebulization Q4H Jones Bales, MD      . levofloxacin (LEVAQUIN) IVPB 750 mg  750 mg Intravenous Q48H Annia Belt, MD      . nystatin (MYCOSTATIN) 100000 UNIT/ML suspension 500,000 Units  5 mL Oral QID Jones Bales, MD      . potassium chloride SA (K-DUR,KLOR-CON) CR tablet 40 mEq  40 mEq Oral Once Jones Bales, MD      . predniSONE (DELTASONE) tablet 40 mg  40 mg Oral Q breakfast Jones Bales, MD        Allergies: Allergies as of 02/20/2014 - Review Complete 02/20/2014  Allergen Reaction Noted  . Codeine  11/28/2009  . Morphine  11/28/2009   Past Medical History  Diagnosis Date  . Hypertension   . HEARING LOSS   . 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 Arthritis   . DVT (deep venous thrombosis)   . Phlebitis   . CKD (chronic kidney disease) stage 3, GFR 30-59 ml/min    Past Surgical History  Procedure Laterality Date  . Eye surgery    . Shoulder open rotator cuff repair  1973    right shoulder, secondary numbness in R hand  . Foot surgery    . Right wrist     Family History  Problem Relation Age of Onset  . Cancer Maternal Uncle     colon  . Cancer Maternal Grandmother     liver  . Cancer Mother     liver  . Cancer Brother     Unknown  . Cancer Sister     Unknown   History   Social History  . Marital Status: Divorced    Spouse Name: N/A    Number of Children: N/A  . Years of Education: N/A   Occupational History  . Not on file.   Social History Main Topics  . Smoking status: Current  Every Day Smoker -- 0.50 packs/day for 20 years    Types: Cigarettes  . Smokeless tobacco: Never Used     Comment: CUTTING BACK/ 8 CIGARETTES  . Alcohol Use: No  . Drug Use: No  . Sexual Activity: No   Other Topics Concern  . Not on file   Social History Narrative   Patient currently lives at home alone, but does have a home  worker who visits her throughout the week to help with things around the house.  Patient states that she still tries to remain active and walks daily.  She is has been divorced for sometime and has one son.  She currently lives off social security.  Patient reports using approximately half a pack of cigarettes daily for 20 years.  She denies any alcohol or illicit drug use.  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      Physical Exam: Filed Vitals:   02/20/14 2248  BP: 107/49  Pulse: 92  Temp: 97.7  Resp: 20   Physical Exam  Constitutional: She is oriented to person, place, and time and well-developed, well-nourished, and in no distress. No distress.  HENT:  Head: Normocephalic and atraumatic.  Eyes: EOM are normal. Pupils are equal, round, and reactive to light. No scleral icterus.  S/p corneal transplant.  Neck: Normal range of motion. Neck supple.  Cardiovascular: Normal rate, regular rhythm and normal heart sounds.   Pulmonary/Chest: Effort normal. No respiratory distress. She has wheezes (Bilateral expiratory.). She has no rales.  Abdominal: Soft. Bowel sounds are normal. She exhibits no distension. There is no tenderness.  Musculoskeletal: Normal range of motion. She exhibits edema (1+ bilaterally, L>R) and tenderness (With palpation of lower back and legs bilaterally).  Pain with flexion and extension of back, straight leg raise negative.  Neurological: She is alert and oriented to person, place, and time. No cranial nerve deficit. She exhibits normal muscle tone. Coordination normal.    Skin: Skin is warm and dry. No rash noted. She is not diaphoretic. No erythema.    Lab results: Basic Metabolic Panel:  Recent Labs  02/20/14 1918  NA 140  K 3.0*  CL 97  CO2 28  GLUCOSE 164*  BUN 11  CREATININE 1.31*  CALCIUM 9.5   CBC:  Recent Labs  02/20/14 1918  WBC 7.5  HGB 13.9  HCT 42.5  MCV 97.5  PLT 202   Cardiac Enzymes:  Recent Labs  02/20/14 1918  TROPONINI <0.30   BNP:  Recent Labs  02/20/14 1918  PROBNP 31.7   Procalcitonin: <0.10  Imaging results:  Dg Chest 2 View (if Patient Has Fever And/or Copd)  02/20/2014   CLINICAL DATA:  Cough. Shortness of breath. Tobacco use. Chronic kidney disease.  EXAM: CHEST  2 VIEW  COMPARISON:  02/12/2014  FINDINGS: Mildly enlarged cardiopericardial silhouette. Increased airspace opacity at the left lung base the is indistinct on the lateral projection and could be within the lingula or left lower lobe -I favor the former.  Atherosclerotic aortic arch. Airway thickening is present, suggesting bronchitis or reactive airways disease. Indistinct pulmonary vasculature.  IMPRESSION: 1. Cardiomegaly with indistinct pulmonary vasculature. This could be due to interstitial edema or atypical pneumonia. 2. Confluent airspace opacity believed to likely be in the lingula, favoring pneumonia over confluent edema.   Electronically Signed   By: Sherryl Barters M.D.   On: 02/20/2014 19:17   Dg Lumbar Spine Complete  02/20/2014   CLINICAL DATA:  Low back pain for 1 week.  No known injury.  EXAM: LUMBAR SPINE - COMPLETE 4+ VIEW  COMPARISON:  CT abdomen and pelvis 08/05/2009.  FINDINGS: Remote superior endplate compression fractures of T12 and L3 are identified. Vertebral body height is otherwise maintained. Facet arthropathy is worst in the lower lumbar spine. Paraspinous structures are unremarkable.  IMPRESSION: No acute finding.  Remote T12 and  L3 compression fractures, unchanged.  Facet degenerative disease lower lumbar spine.    Electronically Signed   By: Inge Rise M.D.   On: 02/20/2014 19:17    Other results: EKG: normal sinus rhythm, occasional PVCs.  Assessment & Plan by Problem: Active Problems:   CAP (community acquired pneumonia)   COPD with acute lower respiratory infection   #Community acquired pneumonia with COPD exacerbation Increasingly productive cough with shortness of breath and hypoxia.  Lingular opacity noted on chest x-ray.  0/4 SIRS criteria.  Procalcitonin undetectable, suggesting this is likely a viral pneumonia.  Breathing responded to albuterol and atrovent, suggesting she has an underlying component of COPD.  No PFTs in Epic, but she has been on Spiriva and Advair in the past.  Previous clinic note mentions that she did not like using Spiriva because it is hard to use the capsule, but she reports that Advair helped her breathing.  Opacity on chest x-ray could also represent edema and interstitial edema noted, cannot rule out that this is secondary to heart failure. No echo in system, however BNP normal. -Stop ceftriaxone and azithromycin given low procalcitonin and switch to Levaquin for COPD exacerbation. -Duonebs q4h. -Albuterol nebs q2h PRN. -Prednisone 40 mg PO daily. -Consider restarting Advair and Spiriva at discharge. -Consider TTE to evaluate cardiac function. -Consult PT/OT. -Influenza panel. -PFTs as outpatient.  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  Had been on Warfarin in the past, but has denied anticoagulation since then.  Lupus anticoagulant positive in 2013, but negative on repeat.  Now, has chronic L>R edema likely secondary to amlodipine use, but need to rule out recurrent DVT. -Lower extremity dopplers. -Heparin for DVT prophylaxis.  #Low back pain Likely musculoskeletal lasting for a week.  No new finding on x-ray or red flag symptoms.  Tender to palpation. -Tylenol PRN.   #Osteoporosis Compression fractures notes in 2011, not on  vitamin D and no DEXA scans. -Check vit D 25 hydroxy. -Start vitamin D supplementation.  #Hypokalemia Secondary to nebs. -Kdur 40 mEq. -Check Mg.  #Hypertension Normotensive in the ER. -Continue home amlodipine 10 mg daily.  #CKD stage 3 Likely secondary to hypertension. Cr 1.31 on admission, at baseline. -Continue to monitor BMP. -Avoid NSAIDs.  #Tobacco abuse Patient want to quit smoking, but not ready currently.  Smoking 1/2 PPD. -Nicotine patch.  #Thrush Developed after starting albuterol inhaler. -Continue nystatin suspension 4x daily.  Dispo: Disposition is deferred at this time, awaiting improvement of current medical problems. Anticipated discharge in approximately 1-2 day(s).   The patient does have a current PCP [name], MD), therefore will be require OPC follow-up after discharge.   The patient does have transportation limitations that hinder transportation to clinic appointments.   Signed:  Arman Filter, MD, PhD PGY-1 Internal Medicine Teaching Service Pager: [phone] 02/21/2014, 1:47 AM

## 2014-02-20 NOTE — ED Notes (Signed)
Attempted to draw pt lab was unsuccessful

## 2014-02-20 NOTE — ED Provider Notes (Signed)
CSN: [id number]     Arrival date & time 02/20/14  1801 History   First MD Initiated Contact with Patient 02/20/14 1818     Chief Complaint  Patient presents with  . Shortness of Breath  . Back Pain     (Consider location/radiation/quality/duration/timing/severity/associated sxs/prior Treatment) HPI Comments: Patient presents to the ER with multiple complaints. Patient reports that she has had progressively worsening cough and difficulty breathing for more than a week. Patient reports that she was seen by her doctor week ago with a cough, has worsened since then. Patient became acutely very short of breath tonight and called EMS. EMS reported significant wheezing, patient has been given Solu-Medrol 105 mg IV, albuterol 10 mg, Atrovent 0.5 mg during transport. She reports she is feeling much better with her breathing now. Patient does not think she has had a fever. She has not chest pain.  Patient does report that she has low back pain. This has been ongoing for more than a week. The patient reports a sharp and constant pain in the lower back that worsens with movement. She denies any recent fall or injury. The pain does not radiate to the extremities. No numbness, tingling or weakness in lower extremities. She denies change in bowel and bladder function.  Patient is a 73 y.o. female presenting with shortness of breath and back pain.  Shortness of Breath Associated symptoms: cough   Back Pain   Past Medical History  Diagnosis Date  . Hypertension   . HEARING LOSS   . 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 Arthritis   . DVT (deep venous thrombosis)   . Phlebitis   . CKD (chronic kidney disease) stage 3, GFR 30-59 ml/min    Past Surgical History  Procedure Laterality Date  . Eye surgery    . Shoulder open rotator cuff repair  1973    right shoulder, secondary numbness in R hand  . Foot surgery    . Right wrist     Family History  Problem Relation Age of  Onset  . Cancer Maternal Uncle     colon  . Cancer Maternal Grandmother     liver  . Cancer Mother     liver  . Cancer Brother     Unknown  . Cancer Sister     Unknown   History  Substance Use Topics  . Smoking status: Current Every Day Smoker -- 0.50 packs/day for 20 years    Types: Cigarettes  . Smokeless tobacco: Never Used     Comment: CUTTING BACK/ 8 CIGARETTES  . Alcohol Use: No   OB History   Grav Para Term Preterm Abortions TAB SAB Ect Mult Living                 Review of Systems  Respiratory: Positive for cough and shortness of breath.   Musculoskeletal: Positive for back pain.  All other systems reviewed and are negative.     Allergies  Codeine and Morphine  Home Medications   Prior to Admission medications   Medication Sig Start Date End Date Taking? Authorizing Provider  amLODipine (NORVASC) 10 MG tablet Take 1 tablet (10 mg total) by mouth daily.  11/30/14  [name], MD  nystatin (MYCOSTATIN) 100000 UNIT/ML suspension Take 5 mLs (500,000 Units total) by mouth 4 (four) times daily. 02/17/14   Wilber Oliphant, MD   BP 108/62  Pulse 88  Temp(Src) 98.1 F (36.7 C) (  Oral)  Resp 21  SpO2 100% Physical Exam  Constitutional: She is oriented to person, place, and time. She appears well-developed and well-nourished. No distress.  HENT:  Head: Normocephalic and atraumatic.  Right Ear: Hearing normal.  Left Ear: Hearing normal.  Nose: Nose normal.  Mouth/Throat: Oropharynx is clear and moist and mucous membranes are normal.  Eyes: Conjunctivae and EOM are normal. Pupils are equal, round, and reactive to light.  Neck: Normal range of motion. Neck supple.  Cardiovascular: Regular rhythm, S1 normal and S2 normal.  Exam reveals no gallop and no friction rub.   No murmur heard. Pulmonary/Chest: Effort normal. No respiratory distress. She has decreased breath sounds. She has wheezes. She exhibits no tenderness.  Abdominal: Soft. Normal appearance and  bowel sounds are normal. There is no hepatosplenomegaly. There is no tenderness. There is no rebound, no guarding, no tenderness at McBurney's point and negative Murphy's sign. No hernia.  Musculoskeletal: Normal range of motion.  Neurological: She is alert and oriented to person, place, and time. She has normal strength. No cranial nerve deficit or sensory deficit. Coordination normal. GCS eye subscore is 4. GCS verbal subscore is 5. GCS motor subscore is 6.  Skin: Skin is warm, dry and intact. No rash noted. No cyanosis.  Psychiatric: She has a normal mood and affect. Her speech is normal and behavior is normal. Thought content normal.    ED Course  Procedures (including critical care time) Labs Review Labs Reviewed  CBC  BASIC METABOLIC PANEL  TROPONIN I  PRO B NATRIURETIC PEPTIDE    Imaging Review Dg Chest 2 View (if Patient Has Fever And/or Copd)  02/20/2014   CLINICAL DATA:  Cough. Shortness of breath. Tobacco use. Chronic kidney disease.  EXAM: CHEST  2 VIEW  COMPARISON:  02/12/2014  FINDINGS: Mildly enlarged cardiopericardial silhouette. Increased airspace opacity at the left lung base the is indistinct on the lateral projection and could be within the lingula or left lower lobe -I favor the former.  Atherosclerotic aortic arch. Airway thickening is present, suggesting bronchitis or reactive airways disease. Indistinct pulmonary vasculature.  IMPRESSION: 1. Cardiomegaly with indistinct pulmonary vasculature. This could be due to interstitial edema or atypical pneumonia. 2. Confluent airspace opacity believed to likely be in the lingula, favoring pneumonia over confluent edema.   Electronically Signed   By: Sherryl Barters M.D.   On: 02/20/2014 19:17   Dg Lumbar Spine Complete  02/20/2014   CLINICAL DATA:  Low back pain for 1 week.  No known injury.  EXAM: LUMBAR SPINE - COMPLETE 4+ VIEW  COMPARISON:  CT abdomen and pelvis 08/05/2009.  FINDINGS: Remote superior endplate compression  fractures of T12 and L3 are identified. Vertebral body height is otherwise maintained. Facet arthropathy is worst in the lower lumbar spine. Paraspinous structures are unremarkable.  IMPRESSION: No acute finding.  Remote T12 and L3 compression fractures, unchanged.  Facet degenerative disease lower lumbar spine.   Electronically Signed   By: Inge Rise M.D.   On: 02/20/2014 19:17     EKG Interpretation   Date/Time:  Monday February 20 2014 18:29:16 EDT Ventricular Rate:  90 PR Interval:  148 QRS Duration: 82 QT Interval:  342 QTC Calculation: 418 R Axis:   53 Text Interpretation:  Sinus rhythm Multiple ventricular premature  complexes Borderline repolarization abnormality No significant change  since last tracing Confirmed by POLLINA  MD, [place] [phone]) on  02/20/2014 7:27:47 PM      MDM   Final diagnoses:  None   community acquired pneumonia  Patient with a history of mild COPD, continues to smoke. Patient reports progressively worsening cough for more than a week. She arrived by ambulance tonight with significant wheezing. She had exhibited some mild hypoxia, room air oxygen saturation of 80%. The patient was given Solu-Medrol and continuous nebulizer treatment with some improvement. Chest x-ray, however, is concerning for early lingular pneumonia. Patient continues to be short of breath, requiring nasal cannula oxygen. She'll require hospitalization for community acquired pneumonia.  Patient also complaining of lower back pain. She does not have any history of fall. Patient has normal strength and sensation in lower extremities. X-ray lumbar spine was unremarkable.    Orpah Greek, MD 02/20/14 2040

## 2014-02-21 ENCOUNTER — Encounter (HOSPITAL_COMMUNITY): Payer: Self-pay | Admitting: General Practice

## 2014-02-21 DIAGNOSIS — R0602 Shortness of breath: Secondary | ICD-10-CM | POA: Diagnosis not present

## 2014-02-21 DIAGNOSIS — I129 Hypertensive chronic kidney disease with stage 1 through stage 4 chronic kidney disease, or unspecified chronic kidney disease: Secondary | ICD-10-CM | POA: Diagnosis present

## 2014-02-21 DIAGNOSIS — M810 Age-related osteoporosis without current pathological fracture: Secondary | ICD-10-CM | POA: Diagnosis present

## 2014-02-21 DIAGNOSIS — J449 Chronic obstructive pulmonary disease, unspecified: Secondary | ICD-10-CM | POA: Diagnosis present

## 2014-02-21 DIAGNOSIS — G8929 Other chronic pain: Secondary | ICD-10-CM | POA: Diagnosis present

## 2014-02-21 DIAGNOSIS — J440 Chronic obstructive pulmonary disease with acute lower respiratory infection: Secondary | ICD-10-CM | POA: Diagnosis present

## 2014-02-21 DIAGNOSIS — Z947 Corneal transplant status: Secondary | ICD-10-CM | POA: Diagnosis not present

## 2014-02-21 DIAGNOSIS — M545 Low back pain: Secondary | ICD-10-CM | POA: Diagnosis present

## 2014-02-21 DIAGNOSIS — E876 Hypokalemia: Secondary | ICD-10-CM | POA: Diagnosis present

## 2014-02-21 DIAGNOSIS — I872 Venous insufficiency (chronic) (peripheral): Secondary | ICD-10-CM | POA: Diagnosis present

## 2014-02-21 DIAGNOSIS — N183 Chronic kidney disease, stage 3 (moderate): Secondary | ICD-10-CM | POA: Diagnosis present

## 2014-02-21 DIAGNOSIS — F17210 Nicotine dependence, cigarettes, uncomplicated: Secondary | ICD-10-CM | POA: Diagnosis present

## 2014-02-21 DIAGNOSIS — B370 Candidal stomatitis: Secondary | ICD-10-CM | POA: Diagnosis present

## 2014-02-21 DIAGNOSIS — Z86718 Personal history of other venous thrombosis and embolism: Secondary | ICD-10-CM | POA: Diagnosis not present

## 2014-02-21 DIAGNOSIS — H9190 Unspecified hearing loss, unspecified ear: Secondary | ICD-10-CM | POA: Diagnosis present

## 2014-02-21 DIAGNOSIS — Z7952 Long term (current) use of systemic steroids: Secondary | ICD-10-CM | POA: Diagnosis not present

## 2014-02-21 DIAGNOSIS — Z79899 Other long term (current) drug therapy: Secondary | ICD-10-CM | POA: Diagnosis not present

## 2014-02-21 DIAGNOSIS — J189 Pneumonia, unspecified organism: Secondary | ICD-10-CM | POA: Diagnosis present

## 2014-02-21 LAB — INFLUENZA PANEL BY PCR (TYPE A & B)
H1N1 flu by pcr: NOT DETECTED
INFLBPCR: NEGATIVE
Influenza A By PCR: NEGATIVE

## 2014-02-21 LAB — BASIC METABOLIC PANEL
Anion gap: 20 — ABNORMAL HIGH (ref 5–15)
BUN: 15 mg/dL (ref 6–23)
CO2: 23 mEq/L (ref 19–32)
CREATININE: 1.38 mg/dL — AB (ref 0.50–1.10)
Calcium: 9.6 mg/dL (ref 8.4–10.5)
Chloride: 96 mEq/L (ref 96–112)
GFR, EST AFRICAN AMERICAN: 43 mL/min — AB (ref >90)
GFR, EST NON AFRICAN AMERICAN: 37 mL/min — AB (ref >90)
GLUCOSE: 160 mg/dL — AB (ref 70–99)
POTASSIUM: 3.2 meq/L — AB (ref 3.7–5.3)
Sodium: 139 mEq/L (ref 137–147)

## 2014-02-21 LAB — VITAMIN D 25 HYDROXY (VIT D DEFICIENCY, FRACTURES): VIT D 25 HYDROXY: 14 ng/mL — AB (ref 30–89)

## 2014-02-21 LAB — MAGNESIUM: Magnesium: 2.1 mg/dL (ref 1.5–2.5)

## 2014-02-21 MED ORDER — HEPARIN SODIUM (PORCINE) 5000 UNIT/ML IJ SOLN
5000.0000 [IU] | Freq: Three times a day (TID) | INTRAMUSCULAR | Status: DC
  Filled 2014-02-21 (×4): qty 1

## 2014-02-21 MED ORDER — ACETAMINOPHEN 325 MG PO TABS: 650.0000 mg | ORAL_TABLET | Freq: Four times a day (QID) | ORAL | Status: DC | PRN

## 2014-02-21 MED ORDER — VITAMIN D (ERGOCALCIFEROL) 1.25 MG (50000 UNIT) PO CAPS: 50000.0000 [IU] | ORAL_CAPSULE | ORAL | Status: DC

## 2014-02-21 MED ORDER — LEVOFLOXACIN IN D5W 750 MG/150ML IV SOLN
750.0000 mg | INTRAVENOUS | Status: DC
  Administered 2014-02-21: 750 mg via INTRAVENOUS
  Filled 2014-02-21 (×2): qty 150

## 2014-02-21 MED ORDER — TIOTROPIUM BROMIDE MONOHYDRATE 18 MCG IN CAPS: 18.0000 ug | ORAL_CAPSULE | Freq: Every day | RESPIRATORY_TRACT | Status: DC

## 2014-02-21 MED ORDER — FLUTICASONE-SALMETEROL 100-50 MCG/DOSE IN AEPB: 1.0000 | INHALATION_SPRAY | Freq: Two times a day (BID) | RESPIRATORY_TRACT | Status: DC

## 2014-02-21 MED ORDER — POTASSIUM CHLORIDE CRYS ER 20 MEQ PO TBCR
40.0000 meq | EXTENDED_RELEASE_TABLET | Freq: Once | ORAL | Status: AC
Start: 2014-02-21 — End: 2014-02-21
  Administered 2014-02-21: 40 meq via ORAL
  Filled 2014-02-21: qty 2

## 2014-02-21 MED ORDER — ACETAMINOPHEN 650 MG RE SUPP: 650.0000 mg | Freq: Four times a day (QID) | RECTAL | Status: DC | PRN

## 2014-02-21 MED ORDER — POTASSIUM CHLORIDE CRYS ER 20 MEQ PO TBCR
40.0000 meq | EXTENDED_RELEASE_TABLET | Freq: Once | ORAL | Status: AC
  Administered 2014-02-21: 40 meq via ORAL
  Filled 2014-02-21: qty 2

## 2014-02-21 MED ORDER — VITAMIN D (ERGOCALCIFEROL) 1.25 MG (50000 UNIT) PO CAPS
50000.0000 [IU] | ORAL_CAPSULE | ORAL | Status: DC
  Administered 2014-02-21: 50000 [IU] via ORAL
  Filled 2014-02-21: qty 1

## 2014-02-21 MED ORDER — IPRATROPIUM-ALBUTEROL 0.5-2.5 (3) MG/3ML IN SOLN
3.0000 mL | RESPIRATORY_TRACT | Status: DC
  Administered 2014-02-21 (×2): 3 mL via RESPIRATORY_TRACT
  Filled 2014-02-21 (×4): qty 3

## 2014-02-21 MED ORDER — NYSTATIN 100000 UNIT/ML MT SUSP
5.0000 mL | Freq: Four times a day (QID) | OROMUCOSAL | Status: DC
  Administered 2014-02-21: 500000 [IU] via ORAL
  Filled 2014-02-21 (×4): qty 5

## 2014-02-21 MED ORDER — PREDNISONE 20 MG PO TABS
40.0000 mg | ORAL_TABLET | Freq: Every day | ORAL | Status: DC
  Administered 2014-02-21: 40 mg via ORAL
  Filled 2014-02-21 (×2): qty 2

## 2014-02-21 MED ORDER — AMLODIPINE BESYLATE 10 MG PO TABS
10.0000 mg | ORAL_TABLET | Freq: Every day | ORAL | Status: DC
  Administered 2014-02-21: 10 mg via ORAL
  Filled 2014-02-21: qty 1

## 2014-02-21 MED ORDER — PREDNISONE 20 MG PO TABS: ORAL_TABLET | ORAL | Status: DC

## 2014-02-21 MED ORDER — ALBUTEROL SULFATE (2.5 MG/3ML) 0.083% IN NEBU: 2.5000 mg | INHALATION_SOLUTION | RESPIRATORY_TRACT | Status: DC | PRN

## 2014-02-21 MED ORDER — MOMETASONE FURO-FORMOTEROL FUM 200-5 MCG/ACT IN AERO: 2.0000 | INHALATION_SPRAY | Freq: Two times a day (BID) | RESPIRATORY_TRACT | Status: DC

## 2014-02-21 MED ORDER — LEVOFLOXACIN 750 MG PO TABS: ORAL_TABLET | ORAL | Status: DC

## 2014-02-21 NOTE — Discharge Summary (Signed)
Name: [name] MRN: [id number] DOB: Feb 04, 1941 73 y.o. PCP: [name], MD  Date of Admission: 02/20/2014  6:01 PM Date of Discharge: 02/21/2014 Attending Physician: Annia Belt, MD  Discharge Diagnosis: Active Problems:   CAP (community acquired pneumonia)   COPD with acute lower respiratory infection  Discharge Medications:   Medication List         albuterol 108 (90 BASE) MCG/ACT inhaler  Commonly known as:  PROVENTIL HFA;VENTOLIN HFA  Inhale 2 puffs into the lungs every 6 (six) hours as needed for wheezing or shortness of breath.     amLODipine 10 MG tablet  Commonly known as:  NORVASC  Take 1 tablet (10 mg total) by mouth daily.     levofloxacin 750 MG tablet  Commonly known as:  LEVAQUIN  Take one tablet by mouth on 10/14, then 10/16, then 10/18.     mometasone-formoterol 200-5 MCG/ACT Aero  Commonly known as:  DULERA  Inhale 2 puffs into the lungs 2 (two) times daily.     nystatin 100000 UNIT/ML suspension  Commonly known as:  MYCOSTATIN  Take 5 mLs (500,000 Units total) by mouth 4 (four) times daily.     predniSONE 20 MG tablet  Commonly known as:  DELTASONE  Take 2 tablets by mouth (40mg ) on 10/14, 10/15, 10/16, and 10/17.     tiotropium 18 MCG inhalation capsule  Commonly known as:  SPIRIVA  Place 1 capsule (18 mcg total) into inhaler and inhale daily.     Vitamin D (Ergocalciferol) 50000 UNITS Caps capsule  Commonly known as:  DRISDOL  Take 1 capsule (50,000 Units total) by mouth every 7 (seven) days.        Disposition and follow-up:   [name] was discharged from [facility] in Stable condition.  At the hospital follow up visit please address:  1.  COPD (discharged on Dulera and Spiriva. Needs referral for PFTs), tobacco cessation (she may be interested in nicotine patch).       Please refill vitamin D for continued maintenance dose if patient tolerated it well.   2.  Labs / imaging needed at  time of follow-up: none  3.  Pending labs/ test needing follow-up: none  Follow-up Appointments: Follow-up Information   Follow up with Michail Jewels, MD On 03/01/2014. (9:45am for hospital follow up)    Specialty:  Internal Medicine   Contact information:   [place] [address] [phone]      Discharge Instructions:  Procedures Performed:  Dg Chest 2 View (if Patient Has Fever And/or Copd)  02/20/2014   CLINICAL DATA:  Cough. Shortness of breath. Tobacco use. Chronic kidney disease.  EXAM: CHEST  2 VIEW  COMPARISON:  02/12/2014  FINDINGS: Mildly enlarged cardiopericardial silhouette. Increased airspace opacity at the left lung base the is indistinct on the lateral projection and could be within the lingula or left lower lobe -I favor the former.  Atherosclerotic aortic arch. Airway thickening is present, suggesting bronchitis or reactive airways disease. Indistinct pulmonary vasculature.  IMPRESSION: 1. Cardiomegaly with indistinct pulmonary vasculature. This could be due to interstitial edema or atypical pneumonia. 2. Confluent airspace opacity believed to likely be in the lingula, favoring pneumonia over confluent edema.   Electronically Signed   By: Sherryl Barters M.D.   On: 02/20/2014 19:17   Dg Chest 2 View (if Patient Has Fever And/or Copd)  02/12/2014   CLINICAL DATA:  Cough and congestion for 3 days.  Smoker.  EXAM: CHEST  2 VIEW  COMPARISON:  PA and lateral chest 07/22/2009.  FINDINGS: There is cardiomegaly. Coarsening of the pulmonary interstitium is identified. No consolidative process, pneumothorax or effusion.  IMPRESSION: No acute disease.  Cardiomegaly.  Coarsened appearance of the pulmonary interstitium compatible with chronic change.   Electronically Signed   By: Inge Rise M.D.   On: 02/12/2014 20:11   Dg Lumbar Spine Complete  02/20/2014   CLINICAL DATA:  Low back pain for 1 week.  No known injury.  EXAM: LUMBAR SPINE - COMPLETE 4+ VIEW   COMPARISON:  CT abdomen and pelvis 08/05/2009.  FINDINGS: Remote superior endplate compression fractures of T12 and L3 are identified. Vertebral body height is otherwise maintained. Facet arthropathy is worst in the lower lumbar spine. Paraspinous structures are unremarkable.  IMPRESSION: No acute finding.  Remote T12 and L3 compression fractures, unchanged.  Facet degenerative disease lower lumbar spine.   Electronically Signed   By: Inge Rise M.D.   On: 02/20/2014 19:17   Admission HPI: [name] is a 73 year old woman with history of HTN, arthritis, DVT, CKD stage 3, and tobacco abuse presenting with shortness of breath.  [name] reports presenting to the [facility] ER for lower back pain and shortness of breath and cough for more than a week. She was originally seen in the [facility] ED on 10/4 for productive cough and wheezing. She was though to have underlying COPD and was prescribed a course of prednisone and an albuterol inhaler, but she did not fill the prednisone on discharge. She was seen in [facility] on 02/17/14 and reported that the albuterol inhaler had helped, and she her breathing was back at her baseline. However, she reports that her cough has continued since then and is productive of green sputum. Today, she became acutely short of breath and called EMS. EMS reported significant wheezing upon arrival and gave her Sulmedrol 105 mg IV, albuterol, and Atrovent during transport with significant improvement in her breathing. She denies fevers, chest pain, or hemoptysis. She was on Spiriva and Advair in 2012, but she does not know why they were stopped. She also complains of low back pain for more than a week that is sharp and constant. It is worse with movement, palpation, and coughing. She denies any recent falls, radiation of the pain, numbness, tingling, weakness, or incontinence.   In the ER, she was initially satting 98% on room air, but desaturated to 88% and was started on 2L nasal  canula. Chest x-ray showed possible interstitial edema vs. atypical pneumonia and a confluent airspace opacity in the lingula concerning for pneumonia. Lumbar spine x-ray showed degenerative facet disease and remote compression fractures. She was started on ceftriaxone and azithromycin.  Hospital Course by problem list:   #Community acquired pneumonia with COPD exacerbation: She has a history of COPD (no PFTs in Epic) and had previously been prescribed Spiriva and Dulera--Advair is not preferred per her insurance. She reported increasingly productive cough with shortness of breath and hypoxia before calling EMS. She was given Solu-Medrol, albuterol, and atrovent en route to the hospital. On arrival, her O2 saturation was oted to be 80% on RA and she was placed on supplemental O2. She was given continuous nebulizer treatment with improvement in her oxygenation. Chest Xray in the ED showed a lingular opacity concerning for early pneumonia. She was originally stared on ceftriaxone and azithromycin in the ED for community acquired pneumonia and was transitioned to Levaquin at  the time of admission due to concern for COPD exacerbation. She also received Duonebs q4h and Prednisone 40 mg PO daily. Influenza PCR was negative. At discharge, she was prescribed Dulera, Spiriva, Levaquin 750mg  every other day x 3 doses (total of 8 day course), and prednisone taper x5days.   #Low back pain: She reported low back pain x1 week with reproducible tenderness on palpation. X-ray showed old compression fracture but no acute changes. She was given Tylenol as needed for pain.   #Osteoporosis: On chart review, she was noted to have a history of compression fractures in 2011. She has no DEXA scans in EPIC. She was started on Vitamin D, which she should continue after discharge.  #Hypokalemia: Patient was noted to have a K+ of 3.2, likely secondary to nebulized treatments. Her Mg level was found to be normal and her K+ was  repleted with oral supplementation.   #Hypertension: Patient remained normotensive during this hospitalization. She was continue on her home amlodipine 10 mg daily with no changes at discharge.   #CKD stage 3: Patient's Cr was 1.31 on presentation, which is at her baseline. Her Levaquin was renally dosed for Creatinine clearance of <93mL/minute .  #Tobacco abuse: Patient reported she wants to quit smoking, but not ready currently. She smokes 1/2 PPD. She did not require nicotine patch while in the hospital.   #Thrush: Patient developed after recently starting albuterol inhaler. She was continued on her home nystatin suspension 4x daily.  Discharge Vitals:   BP 142/64  Pulse 84  Temp(Src) 97.6 F (36.4 C) (Oral)  Resp 16  SpO2 97%  Discharge Labs:  Results for orders placed during the hospital encounter of 02/20/14 (from the past 24 hour(s))  CBC     Status: None   Collection Time    02/20/14  7:18 PM      Result Value Ref Range   WBC 7.5  4.0 - 10.5 K/uL   RBC 4.36  3.87 - 5.11 MIL/uL   Hemoglobin 13.9  12.0 - 15.0 g/dL   HCT 42.5  36.0 - 46.0 %   MCV 97.5  78.0 - 100.0 fL   MCH 31.9  26.0 - 34.0 pg   MCHC 32.7  30.0 - 36.0 g/dL   RDW 13.3  11.5 - 15.5 %   Platelets 202  150 - 400 K/uL  BASIC METABOLIC PANEL     Status: Abnormal   Collection Time    02/20/14  7:18 PM      Result Value Ref Range   Sodium 140  137 - 147 mEq/L   Potassium 3.0 (*) 3.7 - 5.3 mEq/L   Chloride 97  96 - 112 mEq/L   CO2 28  19 - 32 mEq/L   Glucose, Bld 164 (*) 70 - 99 mg/dL   BUN 11  6 - 23 mg/dL   Creatinine, Ser 1.31 (*) 0.50 - 1.10 mg/dL   Calcium 9.5  8.4 - 10.5 mg/dL   GFR calc non Af Amer 39 (*) >90 mL/min   GFR calc Af Amer 46 (*) >90 mL/min   Anion gap 15  5 - 15  TROPONIN I     Status: None   Collection Time    02/20/14  7:18 PM      Result Value Ref Range   Troponin I <0.30  <0.30 ng/mL  PRO B NATRIURETIC PEPTIDE     Status: None   Collection Time    02/20/14  7:18 PM  Result Value Ref Range   Pro B Natriuretic peptide (BNP) 31.7  0 - 125 pg/mL  PROCALCITONIN     Status: None   Collection Time    02/20/14  7:18 PM      Result Value Ref Range   Procalcitonin <0.10    CULTURE, BLOOD (ROUTINE X 2)     Status: None   Collection Time    02/20/14  7:55 PM      Result Value Ref Range   Specimen Description BLOOD LEFT ANTECUBITAL     Special Requests BOTTLES DRAWN AEROBIC ONLY 4 ML     Culture  Setup Time       Value: 02/20/2014 22:21     Performed at [facility]   Culture       Value:        BLOOD CULTURE RECEIVED NO GROWTH TO DATE CULTURE WILL BE HELD FOR 5 DAYS BEFORE ISSUING A FINAL NEGATIVE REPORT     Performed at [facility]   Report Status PENDING    CULTURE, BLOOD (ROUTINE X 2)     Status: None   Collection Time    02/20/14  7:56 PM      Result Value Ref Range   Specimen Description BLOOD BLOOD LEFT FOREARM     Special Requests BOTTLES DRAWN AEROBIC AND ANAEROBIC 3 ML     Culture  Setup Time       Value: 02/20/2014 22:21     Performed at [facility]   Culture       Value:        BLOOD CULTURE RECEIVED NO GROWTH TO DATE CULTURE WILL BE HELD FOR 5 DAYS BEFORE ISSUING A FINAL NEGATIVE REPORT     Performed at [facility]   Report Status PENDING    INFLUENZA PANEL BY PCR (TYPE A & B, H1N1)     Status: None   Collection Time    02/21/14  4:21 AM      Result Value Ref Range   Influenza A By PCR NEGATIVE  NEGATIVE   Influenza B By PCR NEGATIVE  NEGATIVE   H1N1 flu by pcr NOT DETECTED  NOT DETECTED  VITAMIN D 25 HYDROXY     Status: Abnormal   Collection Time    02/21/14  5:35 AM      Result Value Ref Range   Vit D, 25-Hydroxy 14 (*) 30 - 89 ng/mL  MAGNESIUM     Status: None   Collection Time    02/21/14  5:35 AM      Result Value Ref Range   Magnesium 2.1  1.5 - 2.5 mg/dL  BASIC METABOLIC PANEL     Status: Abnormal   Collection Time    02/21/14  5:35 AM      Result Value Ref Range   Sodium 139  137 -  147 mEq/L   Potassium 3.2 (*) 3.7 - 5.3 mEq/L   Chloride 96  96 - 112 mEq/L   CO2 23  19 - 32 mEq/L   Glucose, Bld 160 (*) 70 - 99 mg/dL   BUN 15  6 - 23 mg/dL   Creatinine, Ser 1.38 (*) 0.50 - 1.10 mg/dL   Calcium 9.6  8.4 - 10.5 mg/dL   GFR calc non Af Amer 37 (*) >90 mL/min   GFR calc Af Amer 43 (*) >90 mL/min   Anion gap 20 (*) 5 - 15    Signed: Blain Pais, MD  PGY3, IMTS 02/21/2014, 3:51 PM    Services Ordered on Discharge: none, she has aide services through the [facility] and will resume this at her discharge. She was seen by PT/OT with no DME equipment of [facility] recommendations.   Equipment Ordered on Discharge: none

## 2014-02-21 NOTE — Progress Notes (Signed)
Pt refused heparin stated "I will not take any kind of blood thinners."

## 2014-02-21 NOTE — Progress Notes (Signed)
I have seen the patient and reviewed the daily progress note by Jake Bathe MS 4 and discussed the care of the patient with them.  See below for documentation of my findings, assessment, and plans.  Subjective: She reports feeling better today with improvement in her breathing. Denies SOB/fever/chills.  She ambulated with PT this morning and her oxygen dropped to 86% on room air. Per nursing report, the patient was ambulated again this afternoon with oxygen saturation of 93% on room air.   Objective: Vital signs in last 24 hours: Filed Vitals:   02/21/14 0442 02/21/14 1058 02/21/14 1128 02/21/14 1341  BP:  118/82  142/64  Pulse:    84  Temp:    97.6 F (36.4 C)  TempSrc:      Resp:    16  SpO2: 95%  96% 97%   Weight change:   Intake/Output Summary (Last 24 hours) at 02/21/14 1354 Last data filed at 02/21/14 1300  Gross per 24 hour  Intake    360 ml  Output      0 ml  Net    360 ml   Vitals reviewed. General: Sitting up in chair, in NAD HEENT: no scleral icterus. Wearing hearing aids. Hard of hearing Cardiac: RRR, no rubs, murmurs or gallops Pulm: No respiratory distress. Inspiratory and expiratory wheezes through all lung fields. No crackles. Abd: soft, nontender, nondistended, BS present MSK: Mild lumbar paraspinal TTP on exam with no erythema or edema Ext: warm and well perfused, no pedal edema Neuro: alert and oriented x3. Follows commands appropriately. Able to move all extremities voluntarily.   Lab Results: Reviewed and documented in Electronic Record Micro Results: Reviewed and documented in Electronic Record Studies/Results: Reviewed and documented in Electronic Record Medications: I have reviewed the patient's current medications. Scheduled Meds: . amLODipine  10 mg Oral Daily  . heparin  5,000 Units Subcutaneous 3 times per day  . ipratropium-albuterol  3 mL Nebulization Q4H  . levofloxacin (LEVAQUIN) IV  750 mg Intravenous Q48H  . nystatin  5 mL Oral  QID  . predniSONE  40 mg Oral Q breakfast  . Vitamin D (Ergocalciferol)  50,000 Units Oral Q7 days   Continuous Infusions:  PRN Meds:.acetaminophen, acetaminophen, albuterol Assessment/Plan: Community Acquired pneumonia with COPD: She has possible LL infiltrated on CXR. Her shortness of breath and cough are improving however, her oxygen saturation dropped to 86% on RA with ambulation today. She is influenza negative.  -Continue DuoNebs q4h -Albuterol nebs q2Hr PRN -Prednisone 40mg  PO daily  -Levaquin 750mg  PO q48 hrs (renally dosed) for 7 days, next dose on 10/14, last dose on 10/18.   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  No tenderness on palpation. Low suspicion for DVT. No need for LE Doppler at this time.  -Heparin for DVT prophylaxis.   Low back pain: Likely MSK due to muscle strain. Lumbar spine Xray with remote T12 and L3 compression fractures, facet degenerative disease lower lumbar spine. Denies bowel/bladder retention, paresthesias.  -Tylenol PRN--reports that this is helping her pain   Osteoporosis: Compression fractures notes in 2011, not on vitamin D and no DEXA scans. Vitamin D 25-hydroxy low at 14.  -Start vitamin D supplementation   Hypokalemia: K+ 3.2 this am. Mg normal. Likely secondary to albuterol nebulizer.  -Kdur 40 mEq once.   Hypertension: Normotensive overnight  -Continue home amlodipine 10 mg daily.   CKD stage 3: Likely secondary to hypertension. Cr 1.31 on admission,  at baseline.  -Continue to monitor BMP -Renally dose Levaquin  -Avoid NSAIDs  Tobacco abuse: She wants to quit smoking but is not ready. Smoking 1/2 PPD.  -nicotine patch PRN -Tobacco cessation counseling  Thrush: Developed after starting albuterol inhaler.  -Continue nystatin suspension 4x daily  DVT prophylaxis: Heparin TID [place]  FEN:  NSL Replete K as needed Heart healthy diet   Dispo: Disposition is deferred at this  time, awaiting improvement of current medical problems.  Anticipated discharge is today.   The patient does have a current PCP ([name], MD) and does need an [facility] hospital follow-up appointment after discharge.  The patient does not have transportation limitations that hinder transportation to clinic appointments.  .Services Needed at time of discharge: Y = Yes, Blank = No PT:   OT:   RN:   Equipment:   Other:     LOS: 1 day   Blain Pais, MD PGY3, IMTS 02/21/2014, 1:54 PM

## 2014-02-21 NOTE — Progress Notes (Signed)
OT Cancellation Note  Patient Details Name: [name] MRN: [id number] DOB: 04-11-1941   Cancelled Treatment:    Reason Eval/Treat Not Completed: OT screened, no needs identified, will sign off. Per PT note pt at baseline for mobility and has [facility] aide who assists with ADLs.  Hortencia Pilar 02/21/2014, 9:49 AM

## 2014-02-21 NOTE — Progress Notes (Signed)
ANTIBIOTIC CONSULT NOTE - INITIAL  Pharmacy Consult for levaquin Indication: copd exacerbation  Allergies  Allergen Reactions  . Codeine     REACTION: Pt c/o "feeling high"  . Morphine     REACTION: feels funny and "high"    Patient Measurements:   Adjusted Body Weight:   Vital Signs: Temp: 97.7 F (36.5 C) (10/13 0007) Temp Source: Oral (10/12 1813) BP: 125/53 mmHg (10/13 0007) Pulse Rate: 68 (10/13 0007) Intake/Output from previous day:   Intake/Output from this shift:    Labs:  Recent Labs  02/20/14 1918  WBC 7.5  HGB 13.9  PLT 202  CREATININE 1.31*   The CrCl is unknown because both a height and weight (above a minimum accepted value) are required for this calculation. No results found for this basename: VANCOTROUGH, VANCOPEAK, VANCORANDOM, GENTTROUGH, GENTPEAK, GENTRANDOM, TOBRATROUGH, TOBRAPEAK, TOBRARND, AMIKACINPEAK, AMIKACINTROU, AMIKACIN,  in the last 72 hours   Microbiology: No results found for this or any previous visit (from the past 720 hour(s)).  Medical History: Past Medical History  Diagnosis Date  . Hypertension   . HEARING LOSS   . 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 Arthritis   . DVT (deep venous thrombosis)   . Phlebitis   . CKD (chronic kidney disease) stage 3, GFR 30-59 ml/min     Medications:   Assessment: Copd exacerbation in 73 yo female with crcl ~43 ml/min  Goal of Therapy:  Renal adjust levaquin  Plan:  levaquin 750mg  q48 hours  Curlene Dolphin 02/21/2014,12:38 AM

## 2014-02-21 NOTE — Progress Notes (Signed)
INITIAL NUTRITION ASSESSMENT  DOCUMENTATION CODES Per approved criteria  -Obesity Unspecified   INTERVENTION: Encourage PO intake.  NUTRITION DIAGNOSIS: Increased nutrient needs related to chronic illness, COPD as evidenced by estimated nutrition needs.   Goal: Pt to meet >/= 90% of their estimated nutrition needs   Monitor:  PO intake, weight trends, labs, I/O's  Reason for Assessment: MD consult for assessment of nutrition requirements/status  73 y.o. female  Admitting Dx: CAP  ASSESSMENT: Pt with history of HTN, arthritis, DVT, CKD stage 3, and tobacco abuse presenting with shortness of breath. Chest x-ray showed possible interstitial edema vs. atypical pneumonia and a confluent airspace opacity in the lingula concerning for pneumonia. Lumbar spine x-ray showed degenerative facet disease and remote compression fractures.  Pt reports having a good appetite currently and prior to admission at home. Current meal completion is 90%. Pt reports she has not had any recent weight loss with her usual body weight of 175 lbs. Pt reports she does not need any oral supplements at this time as she is eating well. Pt with encouraged to continue eating her food at meals.  Pt with observed no significant fat or muscle mass loss.  Labs: Low potassium and GFR. High creatinine.  Height: Ht Readings from Last 1 Encounters:  02/17/14 5\' 3"  (1.6 m)    Weight: Wt Readings from Last 1 Encounters:  02/17/14 175 lb 11.2 oz (79.697 kg)    Ideal Body Weight: 115 lbs  % Ideal Body Weight: 152%  Wt Readings from Last 10 Encounters:  02/17/14 175 lb 11.2 oz (79.697 kg)  12/15/13 178 lb 6.4 oz (80.922 kg)  09/13/13 179 lb 14.4 oz (81.602 kg)  08/29/13 177 lb 9.6 oz (80.559 kg)  06/16/13 174 lb (78.926 kg)  11/04/12 174 lb 4.8 oz (79.062 kg)  10/07/12 173 lb 9.6 oz (78.744 kg)  09/01/12 176 lb (79.833 kg)  08/19/12 175 lb 9.6 oz (79.652 kg)  01/26/12 172 lb 11.2 oz (78.336 kg)    Usual  Body Weight: 175 lbs  % Usual Body Weight: 100%  BMI: Body Mass Index: 31 (kg/m^2) Class I obesity  Estimated Nutritional Needs: Kcal: 1800-2000 Protein: 85-95 grams Fluid: 1.8 - 2 L/day  Skin: non-pitting LE edema  Diet Order: Cardiac  EDUCATION NEEDS: -No education needs identified at this time   Intake/Output Summary (Last 24 hours) at 02/21/14 0916 Last data filed at 02/21/14 0803  Gross per 24 hour  Intake    120 ml  Output      0 ml  Net    120 ml    Last BM: 10/12  Labs:   Recent Labs Lab 02/20/14 1918 02/21/14 0535  NA 140 139  K 3.0* 3.2*  CL 97 96  CO2 28 23  BUN 11 15  CREATININE 1.31* 1.38*  CALCIUM 9.5 9.6  MG  --  2.1  GLUCOSE 164* 160*    CBG (last 3)  No results found for this basename: GLUCAP,  in the last 72 hours  Scheduled Meds: . amLODipine  10 mg Oral Daily  . heparin  5,000 Units Subcutaneous 3 times per day  . ipratropium-albuterol  3 mL Nebulization Q4H  . levofloxacin (LEVAQUIN) IV  750 mg Intravenous Q48H  . nystatin  5 mL Oral QID  . predniSONE  40 mg Oral Q breakfast  . Vitamin D (Ergocalciferol)  50,000 Units Oral Q7 days    Continuous Infusions:   Past Medical History  Diagnosis Date  . Hypertension   .  HEARING LOSS   . 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 Arthritis   . DVT (deep venous thrombosis)   . Phlebitis   . CKD (chronic kidney disease) stage 3, GFR 30-59 ml/min     Past Surgical History  Procedure Laterality Date  . Eye surgery    . Shoulder open rotator cuff repair  1973    right shoulder, secondary numbness in R hand  . Foot surgery    . Right wrist      Kallie Locks, MS, RD, Provisional LDN Pager # [phone] After hours/ weekend pager # [phone]

## 2014-02-21 NOTE — Discharge Instructions (Signed)
It was a pleasure taking care of you!  You were hospitalized and treated for early pneumonia and COPD exacerbation.   Please start taking your inhalers, the Dulera, Spiriva, and albuterol.   Continue taking the Prednisone and the antibiotic (levaquin) until you finish them.   You have been started on Vitamin D supplementation for your bone health. Take one capsule once per week. Your next dose will be on Tuesday, 10/20.   Continue working on quitting smoking. Please refer to the information below to help you quit smoking.    Smoking Cessation Quitting smoking is important to your health and has many advantages. However, it is not always easy to quit since nicotine is a very addictive drug. Oftentimes, people try 3 times or more before being able to quit. This document explains the best ways for you to prepare to quit smoking. Quitting takes hard work and a lot of effort, but you can do it. ADVANTAGES OF QUITTING SMOKING  You will live longer, feel better, and live better.  Your body will feel the impact of quitting smoking almost immediately.  Within 20 minutes, blood pressure decreases. Your pulse returns to its normal level.  After 8 hours, carbon monoxide levels in the blood return to normal. Your oxygen level increases.  After 24 hours, the chance of having a heart attack starts to decrease. Your breath, hair, and body stop smelling like smoke.  After 48 hours, damaged nerve endings begin to recover. Your sense of taste and smell improve.  After 72 hours, the body is virtually free of nicotine. Your bronchial tubes relax and breathing becomes easier.  After 2 to 12 weeks, lungs can hold more air. Exercise becomes easier and circulation improves.  The risk of having a heart attack, stroke, cancer, or lung disease is greatly reduced.  After 1 year, the risk of coronary heart disease is cut in half.  After 5 years, the risk of stroke falls to the same as a nonsmoker.  After 10  years, the risk of lung cancer is cut in half and the risk of other cancers decreases significantly.  After 15 years, the risk of coronary heart disease drops, usually to the level of a nonsmoker.  If you are pregnant, quitting smoking will improve your chances of having a healthy baby.  The people you live with, especially any children, will be healthier.  You will have extra money to spend on things other than cigarettes. QUESTIONS TO THINK ABOUT BEFORE ATTEMPTING TO QUIT You may want to talk about your answers with your health care provider.  Why do you want to quit?  If you tried to quit in the past, what helped and what did not?  What will be the most difficult situations for you after you quit? How will you plan to handle them?  Who can help you through the tough times? Your family? Friends? A health care provider?  What pleasures do you get from smoking? What ways can you still get pleasure if you quit? Here are some questions to ask your health care provider:  How can you help me to be successful at quitting?  What medicine do you think would be best for me and how should I take it?  What should I do if I need more help?  What is smoking withdrawal like? How can I get information on withdrawal? GET READY  Set a quit date.  Change your environment by getting rid of all cigarettes, ashtrays, matches, and lighters  in your home, car, or work. Do not let people smoke in your home.  Review your past attempts to quit. Think about what worked and what did not. GET SUPPORT AND ENCOURAGEMENT You have a better chance of being successful if you have help. You can get support in many ways.  Tell your family, friends, and coworkers that you are going to quit and need their support. Ask them not to smoke around you.  Get individual, group, or telephone counseling and support. Programs are available at [employer] and health centers. Call your local health department for  information about programs in your area.  Spiritual beliefs and practices may help some smokers quit.  Download a "quit meter" on your computer to keep track of quit statistics, such as how long you have gone without smoking, cigarettes not smoked, and money saved.  Get a self-help book about quitting smoking and staying off tobacco. Marion yourself from urges to smoke. Talk to someone, go for a walk, or occupy your time with a task.  Change your normal routine. Take a different route to work. Drink tea instead of coffee. Eat breakfast in a different place.  Reduce your stress. Take a hot bath, exercise, or read a book.  Plan something enjoyable to do every day. Reward yourself for not smoking.  Explore interactive web-based programs that specialize in helping you quit. GET MEDICINE AND USE IT CORRECTLY Medicines can help you stop smoking and decrease the urge to smoke. Combining medicine with the above behavioral methods and support can greatly increase your chances of successfully quitting smoking.  Nicotine replacement therapy helps deliver nicotine to your body without the negative effects and risks of smoking. Nicotine replacement therapy includes nicotine gum, lozenges, inhalers, nasal sprays, and skin patches. Some may be available over-the-counter and others require a prescription.  Antidepressant medicine helps people abstain from smoking, but how this works is unknown. This medicine is available by prescription.  Nicotinic receptor partial agonist medicine simulates the effect of nicotine in your brain. This medicine is available by prescription. Ask your health care provider for advice about which medicines to use and how to use them based on your health history. Your health care provider will tell you what side effects to look out for if you choose to be on a medicine or therapy. Carefully read the information on the package. Do not use any  other product containing nicotine while using a nicotine replacement product.  RELAPSE OR DIFFICULT SITUATIONS Most relapses occur within the first 3 months after quitting. Do not be discouraged if you start smoking again. Remember, most people try several times before finally quitting. You may have symptoms of withdrawal because your body is used to nicotine. You may crave cigarettes, be irritable, feel very hungry, cough often, get headaches, or have difficulty concentrating. The withdrawal symptoms are only temporary. They are strongest when you first quit, but they will go away within 10-14 days. To reduce the chances of relapse, try to:  Avoid drinking alcohol. Drinking lowers your chances of successfully quitting.  Reduce the amount of caffeine you consume. Once you quit smoking, the amount of caffeine in your body increases and can give you symptoms, such as a rapid heartbeat, sweating, and anxiety.  Avoid smokers because they can make you want to smoke.  Do not let weight gain distract you. Many smokers will gain weight when they quit, usually less than 10 pounds. Eat a healthy diet  and stay active. You can always lose the weight gained after you quit.  Find ways to improve your mood other than smoking. FOR MORE INFORMATION  www.smokefree.gov  Document Released: 04/22/2001 Document Revised: 09/12/2013 Document Reviewed: 08/07/2011 [facility] Patient Information 2015 [place], [place]. This information is not intended to replace advice given to you by your health care provider. Make sure you discuss any questions you have with your health care provider.   Smoking Cessation, Tips for Success If you are ready to quit smoking, congratulations! You have chosen to help yourself be healthier. Cigarettes bring nicotine, tar, carbon monoxide, and other irritants into your body. Your lungs, heart, and blood vessels will be able to work better without these poisons. There are many different ways to  quit smoking. Nicotine gum, nicotine patches, a nicotine inhaler, or nicotine nasal spray can help with physical craving. Hypnosis, support groups, and medicines help break the habit of smoking. WHAT THINGS CAN I DO TO MAKE QUITTING EASIER?  Here are some tips to help you quit for good:  Pick a date when you will quit smoking completely. Tell all of your friends and family about your plan to quit on that date.  Do not try to slowly cut down on the number of cigarettes you are smoking. Pick a quit date and quit smoking completely starting on that day.  Throw away all cigarettes.   Clean and remove all ashtrays from your home, work, and car.  On a card, write down your reasons for quitting. Carry the card with you and read it when you get the urge to smoke.  Cleanse your body of nicotine. Drink enough water and fluids to keep your urine clear or pale yellow. Do this after quitting to flush the nicotine from your body.  Learn to predict your moods. Do not let a bad situation be your excuse to have a cigarette. Some situations in your life might tempt you into wanting a cigarette.  Never have "just one" cigarette. It leads to wanting another and another. Remind yourself of your decision to quit.  Change habits associated with smoking. If you smoked while driving or when feeling stressed, try other activities to replace smoking. Stand up when drinking your coffee. Brush your teeth after eating. Sit in a different chair when you read the paper. Avoid alcohol while trying to quit, and try to drink fewer caffeinated beverages. Alcohol and caffeine may urge you to smoke.  Avoid foods and drinks that can trigger a desire to smoke, such as sugary or spicy foods and alcohol.  Ask people who smoke not to smoke around you.  Have something planned to do right after eating or having a cup of coffee. For example, plan to take a walk or exercise.  Try a relaxation exercise to calm you down and decrease  your stress. Remember, you may be tense and nervous for the first 2 weeks after you quit, but this will pass.  Find new activities to keep your hands busy. Play with a pen, coin, or rubber band. Doodle or draw things on paper.  Brush your teeth right after eating. This will help cut down on the craving for the taste of tobacco after meals. You can also try mouthwash.   Use oral substitutes in place of cigarettes. Try using lemon drops, carrots, cinnamon sticks, or chewing gum. Keep them handy so they are available when you have the urge to smoke.  When you have the urge to smoke, try deep breathing.  Designate your home as a nonsmoking area.  If you are a heavy smoker, ask your health care provider about a prescription for nicotine chewing gum. It can ease your withdrawal from nicotine.  Reward yourself. Set aside the cigarette money you save and buy yourself something nice.  Look for support from others. Join a support group or smoking cessation program. Ask someone at home or at work to help you with your plan to quit smoking.  Always ask yourself, "Do I need this cigarette or is this just a reflex?" Tell yourself, "Today, I choose not to smoke," or "I do not want to smoke." You are reminding yourself of your decision to quit.  Do not replace cigarette smoking with electronic cigarettes (commonly called e-cigarettes). The safety of e-cigarettes is unknown, and some may contain harmful chemicals.  If you relapse, do not give up! Plan ahead and think about what you will do the next time you get the urge to smoke. HOW WILL I FEEL WHEN I QUIT SMOKING? You may have symptoms of withdrawal because your body is used to nicotine (the addictive substance in cigarettes). You may crave cigarettes, be irritable, feel very hungry, cough often, get headaches, or have difficulty concentrating. The withdrawal symptoms are only temporary. They are strongest when you first quit but will go away within 10-14  days. When withdrawal symptoms occur, stay in control. Think about your reasons for quitting. Remind yourself that these are signs that your body is healing and getting used to being without cigarettes. Remember that withdrawal symptoms are easier to treat than the major diseases that smoking can cause.  Even after the withdrawal is over, expect periodic urges to smoke. However, these cravings are generally short lived and will go away whether you smoke or not. Do not smoke! WHAT RESOURCES ARE AVAILABLE TO HELP ME QUIT SMOKING? Your health care provider can direct you to community resources or hospitals for support, which may include:  Group support.  Education.  Hypnosis.  Therapy. Document Released: 01/25/2004 Document Revised: 09/12/2013 Document Reviewed: 10/14/2012 [facility] Patient Information 2015 [place], [place]. This information is not intended to replace advice given to you by your health care provider. Make sure you discuss any questions you have with your health care provider.

## 2014-02-21 NOTE — Progress Notes (Signed)
Have paged Dr Hayes Ludwig several times no response

## 2014-02-21 NOTE — Discharge Summary (Signed)
Medicine Attending I personally examined this patient on the day of discharge and I concur with the evaluation and discharge plan as recorded by resident physician Dr Ernesto Rutherford, MD, [place]  Hematology-Oncology/Internal Medicine

## 2014-02-21 NOTE — Progress Notes (Signed)
Ambulated pt in hall sat on side of bed after deep breathing O2 sat 93%

## 2014-02-21 NOTE — Care Management Note (Signed)
CARE MANAGEMENT NOTE 02/21/2014  Patient:  [name], [name]   Account Number:  [id number]  Date Initiated:  02/21/2014  Documentation initiated by:  Ricki Miller  Subjective/Objective Assessment:   73 yr old female admitted with shortness of breath and back pain.     Action/Plan:   PT/OT have signed off- no needs.  Patient has aide services through [facility] [phone]. Patient desats with ambulation, CM will continue to monitor for oxygen needs.   Anticipated DC Date:  02/22/2014   Anticipated DC Plan:        DC Planning Services  CM consult      Choice offered to / List presented to:             Status of service:  In process, will continue to follow Medicare Important Message given?   (If response is "NO", the following Medicare IM given date fields will be blank) Date Medicare IM given:   Medicare IM given by:   Date Additional Medicare IM given:   Additional Medicare IM given by:    Discharge Disposition:    Per UR Regulation:  Reviewed for med. necessity/level of care/duration of stay

## 2014-02-21 NOTE — Evaluation (Signed)
Physical Therapy Evaluation and Discharge  Patient Details Name: [name] MRN: [id number] DOB: 20-Mar-1941 Today's Date: 02/21/2014   History of Present Illness  73 year old woman with history of HTN, arthritis, DVT, CKD stage 3, and tobacco abuse presenting with shortness of breath. Pt COPD gold protocol.   Clinical Impression  Patient evaluated by Physical Therapy with no further acute PT needs identified. Pt appears to be baseline for mobility.  Pt on RA and O2 at 91% prior to activity; ambulating without O2 pt dropped to 86%, upon returning to room pt placed on 2L O2 and O2 sats returned to 92%.  All education has been completed and the patient has no further questions. See below for any follow-up Physial Therapy or equipment needs. PT is signing off. Thank you for this referral.     Follow Up Recommendations No PT follow up;Supervision - Intermittent    Equipment Recommendations  None recommended by PT    Recommendations for Other Services       Precautions / Restrictions Precautions Precaution Comments: pt legally blind  Restrictions Weight Bearing Restrictions: No      Mobility  Bed Mobility Overal bed mobility: Modified Independent             General bed mobility comments: no (A) required   Transfers Overall transfer level: Modified independent Equipment used: None             General transfer comment: pt demo good balance and technique with transfers; no sway or LOB noted  Ambulation/Gait Ambulation/Gait assistance: Supervision Ambulation Distance (Feet): 220 Feet Assistive device: None Gait Pattern/deviations: WFL(Within Functional Limits)   Gait velocity interpretation: at or above normal speed for age/gender General Gait Details: supervision due to visual deficits; pt navigating hallway and obstacles at good speed and no LOB noted; pt appears to be at baseline   Stairs            Wheelchair Mobility    Modified Rankin (Stroke  Patients Only)       Balance Overall balance assessment: No apparent balance deficits (not formally assessed);Modified Independent                           High level balance activites: Head turns;Sudden stops;Direction changes High Level Balance Comments: no LOB noted             Pertinent Vitals/Pain Pain Assessment: No/denies pain    Home Living Family/patient expects to be discharged to:: Private residence Living Arrangements: Alone Available Help at Discharge: Family;Available PRN/intermittently Type of Home: Apartment Home Access: Level entry     Home Layout: One level Home Equipment: Cane - single point;Walker - 2 wheels      Prior Function Level of Independence: Needs assistance   Gait / Transfers Assistance Needed: ambulates without AD   ADL's / Homemaking Assistance Needed: has [place] aide 5 days a week for 3 hours to (A) with bathing, cooking, cleaning as needed        Hand Dominance        Extremity/Trunk Assessment   Upper Extremity Assessment: Overall WFL for tasks assessed           Lower Extremity Assessment: Overall WFL for tasks assessed      Cervical / Trunk Assessment: Normal  Communication   Communication: HOH  Cognition Arousal/Alertness: Awake/alert Behavior During Therapy: WFL for tasks assessed/performed Overall Cognitive Status: Within Functional Limits for tasks assessed  General Comments      Exercises        Assessment/Plan    PT Assessment Patent does not need any further PT services  PT Diagnosis Generalized weakness;Abnormality of gait   PT Problem List    PT Treatment Interventions     PT Goals (Current goals can be found in the Care Plan section) Acute Rehab PT Goals Patient Stated Goal: to go home today PT Goal Formulation: No goals set, d/c therapy    Frequency     Barriers to discharge        Co-evaluation               End of Session Equipment  Utilized During Treatment: Gait belt;Oxygen Activity Tolerance: Patient tolerated treatment well Patient left: in chair;with call bell/phone within reach Nurse Communication: Mobility status;Other (comment) (O2 sats)    Functional Assessment Tool Used: clinical judgement  Functional Limitation: Mobility: Walking and moving around Mobility: Walking and Moving Around Current Status [phone]): At least 1 percent but less than 20 percent impaired, limited or restricted Mobility: Walking and Moving Around Goal Status [phone]): At least 1 percent but less than 20 percent impaired, limited or restricted Mobility: Walking and Moving Around Discharge Status [phone]): At least 1 percent but less than 20 percent impaired, limited or restricted    Time: 0828-0848 PT Time Calculation (min): 20 min   Charges:   PT Evaluation $Initial PT Evaluation Tier I: 1 Procedure PT Treatments $Gait Training: 8-22 mins   PT G Codes:   Functional Assessment Tool Used: clinical judgement  Functional Limitation: Mobility: Walking and moving around    [place], [place], [place]  [phone] 02/21/2014, 9:29 AM

## 2014-02-21 NOTE — H&P (Signed)
Medicine attending admission note: I personally interviewed and examined this patient, reviewed the lab database, electrocardiogram, and pertinent radiographs, and I concur with the evaluation and management plan as recorded by resident physician Dr. Karle Starch Moding.  Clinical summary: 73 year old woman with hypertension, stage III renal insufficiency, chronic obstructive airway disease, and degenerative arthritis with chronic back pain. She continues to smoke about one half pack of cigarettes per day. She was evaluated in the emergency department one week ago for increased cough and dyspnea. She was treated with steroids and albuterol but no antibiotics. She now presents with increasing cough, dyspnea, and wheezing. Coughing has exacerbated her chronic back pain. She called an ambulance and was brought to the emergency department. En route to the hospital she was given IV Solu-Medrol, albuterol, and Atrovent with significant improvement in her breathing. On arrival to the emergency department, she had a transient fall in oxygen saturation down to 88% and was started on oxygen. A chest radiograph which I personally reviewed showed a probably patchy infiltrate in the left lower lobe.  Initial exam by Dr. Trudee Kuster: The patient did not appear to be in distress. Blood pressure 107/49, pulse 92 regular, temp 97.7, respirations 20. Pertinent findings include bilateral corneal transplants. Regular cardiac rhythm without murmur or gallop. Bilateral expiratory wheezes over the lung fields. No rales. Tender on palpation over her low back. Bilateral peripheral edema left greater than right.  Pertinent lab with potassium 3.0, random glucose 164, E1 11, creatinine 1.3 which is her baseline. Hemoglobin 13.9, white count 7500. A troponin was undetectable. ProBNP normal at 31.7 ProCalcitonin undetectable  EKG: Normal sinus rhythm. Occasional PVC. No acute ischemic changes.  Current exam: A pleasant elderly woman in no  distress Blood pressure 142/64, pulse 84, temperature 97.6 F (36.4 C), temperature source Oral, resp. rate 16, SpO2 97.00%. No change in my exam compared with the exam reported by Dr. Trudee Kuster. Some scattered wheezes over the lung fields. No dullness to percussion. No egophony. No focal neurologic deficits.  Impression #1. Early community-acquired pneumonia with developing left basilar infiltrate #2. Exacerbation of chronic obstructive airway disease secondary to #1  Plan: As detailed in Dr. Vivia Budge note. Antibiotics. Bronchodilators. Steroids. Potassium replacement. Symptomatic treatment of back pain. Begin vitamin D supplementation for osteoporosis.  Murriel Hopper, MD, [facility]  Hematology-Oncology/Internal Medicine

## 2014-02-21 NOTE — Progress Notes (Signed)
Subjective: [name] reports she feels much better today. Breathing is improved. She denies SOB or wheezing. No fevers or chills overnight.   Objective: Vital signs in last 24 hours: Filed Vitals:   02/21/14 0007 02/21/14 0440 02/21/14 0442 02/21/14 1058  BP: 125/53 118/62  118/82  Pulse: 68 81    Temp: 97.7 F (36.5 C) 97.1 F (36.2 C)    TempSrc:      Resp:  20    SpO2: 95% 92% 95%    Weight change:   Intake/Output Summary (Last 24 hours) at 02/21/14 1116 Last data filed at 02/21/14 0803  Gross per 24 hour  Intake    120 ml  Output      0 ml  Net    120 ml   Constitutional: Well developed, in no acute distress.  HENT:  Head: Normocephalic and atraumatic.  Eyes: EOM are normal. Pupils are equal, round, and reactive to light. Vision impaired in both eyes. No scleral icterus.  Cardiovascular: Normal rate, regular rhythm and normal heart sounds.  Pulmonary/Chest: Effort normal. No respiratory distress. She has wheezes (Bilateral expiratory.). She has no rales.  Abdominal: Soft. Bowel sounds are normal. She exhibits no distension. There is no tenderness.  Neurological: She is alert and oriented to person, place, and time. No cranial nerve deficit. S Skin: Skin is warm and dry. No rash noted. She is not diaphoretic. No erythema.   Lab Results: Results for orders placed during the hospital encounter of 02/20/14 (from the past 24 hour(s))  CBC     Status: None   Collection Time    02/20/14  7:18 PM      Result Value Ref Range   WBC 7.5  4.0 - 10.5 K/uL   RBC 4.36  3.87 - 5.11 MIL/uL   Hemoglobin 13.9  12.0 - 15.0 g/dL   HCT 42.5  36.0 - 46.0 %   MCV 97.5  78.0 - 100.0 fL   MCH 31.9  26.0 - 34.0 pg   MCHC 32.7  30.0 - 36.0 g/dL   RDW 13.3  11.5 - 15.5 %   Platelets 202  150 - 400 K/uL  BASIC METABOLIC PANEL     Status: Abnormal   Collection Time    02/20/14  7:18 PM      Result Value Ref Range   Sodium 140  137 - 147 mEq/L   Potassium 3.0 (*) 3.7 - 5.3 mEq/L   Chloride 97  96 - 112 mEq/L   CO2 28  19 - 32 mEq/L   Glucose, Bld 164 (*) 70 - 99 mg/dL   BUN 11  6 - 23 mg/dL   Creatinine, Ser 1.31 (*) 0.50 - 1.10 mg/dL   Calcium 9.5  8.4 - 10.5 mg/dL   GFR calc non Af Amer 39 (*) >90 mL/min   GFR calc Af Amer 46 (*) >90 mL/min   Anion gap 15  5 - 15  TROPONIN I     Status: None   Collection Time    02/20/14  7:18 PM      Result Value Ref Range   Troponin I <0.30  <0.30 ng/mL  PRO B NATRIURETIC PEPTIDE     Status: None   Collection Time    02/20/14  7:18 PM      Result Value Ref Range   Pro B Natriuretic peptide (BNP) 31.7  0 - 125 pg/mL  PROCALCITONIN     Status: None   Collection Time  02/20/14  7:18 PM      Result Value Ref Range   Procalcitonin <0.10    CULTURE, BLOOD (ROUTINE X 2)     Status: None   Collection Time    02/20/14  7:55 PM      Result Value Ref Range   Specimen Description BLOOD LEFT ANTECUBITAL     Special Requests BOTTLES DRAWN AEROBIC ONLY 4 ML     Culture  Setup Time       Value: 02/20/2014 22:21     Performed at [facility]   Culture       Value:        BLOOD CULTURE RECEIVED NO GROWTH TO DATE CULTURE WILL BE HELD FOR 5 DAYS BEFORE ISSUING A FINAL NEGATIVE REPORT     Performed at [facility]   Report Status PENDING    CULTURE, BLOOD (ROUTINE X 2)     Status: None   Collection Time    02/20/14  7:56 PM      Result Value Ref Range   Specimen Description BLOOD BLOOD LEFT FOREARM     Special Requests BOTTLES DRAWN AEROBIC AND ANAEROBIC 3 ML     Culture  Setup Time       Value: 02/20/2014 22:21     Performed at [facility]   Culture       Value:        BLOOD CULTURE RECEIVED NO GROWTH TO DATE CULTURE WILL BE HELD FOR 5 DAYS BEFORE ISSUING A FINAL NEGATIVE REPORT     Performed at [facility]   Report Status PENDING    INFLUENZA PANEL BY PCR (TYPE A & B, H1N1)     Status: None   Collection Time    02/21/14  4:21 AM      Result Value Ref Range   Influenza A By PCR NEGATIVE   NEGATIVE   Influenza B By PCR NEGATIVE  NEGATIVE   H1N1 flu by pcr NOT DETECTED  NOT DETECTED  MAGNESIUM     Status: None   Collection Time    02/21/14  5:35 AM      Result Value Ref Range   Magnesium 2.1  1.5 - 2.5 mg/dL  BASIC METABOLIC PANEL     Status: Abnormal   Collection Time    02/21/14  5:35 AM      Result Value Ref Range   Sodium 139  137 - 147 mEq/L   Potassium 3.2 (*) 3.7 - 5.3 mEq/L   Chloride 96  96 - 112 mEq/L   CO2 23  19 - 32 mEq/L   Glucose, Bld 160 (*) 70 - 99 mg/dL   BUN 15  6 - 23 mg/dL   Creatinine, Ser 1.38 (*) 0.50 - 1.10 mg/dL   Calcium 9.6  8.4 - 10.5 mg/dL   GFR calc non Af Amer 37 (*) >90 mL/min   GFR calc Af Amer 43 (*) >90 mL/min   Anion gap 20 (*) 5 - 15   Micro Results: Recent Results (from the past 240 hour(s))  CULTURE, BLOOD (ROUTINE X 2)     Status: None   Collection Time    02/20/14  7:55 PM      Result Value Ref Range Status   Specimen Description BLOOD LEFT ANTECUBITAL   Final   Special Requests BOTTLES DRAWN AEROBIC ONLY 4 ML   Final   Culture  Setup Time     Final   Value: 02/20/2014  22:21     Performed at [facility]     Final   Value:        BLOOD CULTURE RECEIVED NO GROWTH TO DATE CULTURE WILL BE HELD FOR 5 DAYS BEFORE ISSUING A FINAL NEGATIVE REPORT     Performed at [facility]   Report Status PENDING   Incomplete  CULTURE, BLOOD (ROUTINE X 2)     Status: None   Collection Time    02/20/14  7:56 PM      Result Value Ref Range Status   Specimen Description BLOOD BLOOD LEFT FOREARM   Final   Special Requests BOTTLES DRAWN AEROBIC AND ANAEROBIC 3 ML   Final   Culture  Setup Time     Final   Value: 02/20/2014 22:21     Performed at [facility]   Culture     Final   Value:        BLOOD CULTURE RECEIVED NO GROWTH TO DATE CULTURE WILL BE HELD FOR 5 DAYS BEFORE ISSUING A FINAL NEGATIVE REPORT     Performed at [facility]   Report Status PENDING   Incomplete   Studies/Results: Dg  Chest 2 View (if Patient Has Fever And/or Copd)  02/20/2014   CLINICAL DATA:  Cough. Shortness of breath. Tobacco use. Chronic kidney disease.  EXAM: CHEST  2 VIEW  COMPARISON:  02/12/2014  FINDINGS: Mildly enlarged cardiopericardial silhouette. Increased airspace opacity at the left lung base the is indistinct on the lateral projection and could be within the lingula or left lower lobe -I favor the former.  Atherosclerotic aortic arch. Airway thickening is present, suggesting bronchitis or reactive airways disease. Indistinct pulmonary vasculature.  IMPRESSION: 1. Cardiomegaly with indistinct pulmonary vasculature. This could be due to interstitial edema or atypical pneumonia. 2. Confluent airspace opacity believed to likely be in the lingula, favoring pneumonia over confluent edema.   Electronically Signed   By: Sherryl Barters M.D.   On: 02/20/2014 19:17   Dg Lumbar Spine Complete  02/20/2014   CLINICAL DATA:  Low back pain for 1 week.  No known injury.  EXAM: LUMBAR SPINE - COMPLETE 4+ VIEW  COMPARISON:  CT abdomen and pelvis 08/05/2009.  FINDINGS: Remote superior endplate compression fractures of T12 and L3 are identified. Vertebral body height is otherwise maintained. Facet arthropathy is worst in the lower lumbar spine. Paraspinous structures are unremarkable.  IMPRESSION: No acute finding.  Remote T12 and L3 compression fractures, unchanged.  Facet degenerative disease lower lumbar spine.   Electronically Signed   By: Inge Rise M.D.   On: 02/20/2014 19:17   Medications: I have reviewed the patient's current medications. Scheduled Meds: . amLODipine  10 mg Oral Daily  . heparin  5,000 Units Subcutaneous 3 times per day  . ipratropium-albuterol  3 mL Nebulization Q4H  . levofloxacin (LEVAQUIN) IV  750 mg Intravenous Q48H  . nystatin  5 mL Oral QID  . predniSONE  40 mg Oral Q breakfast  . Vitamin D (Ergocalciferol)  50,000 Units Oral Q7 days   Continuous Infusions:  PRN  Meds:.acetaminophen, acetaminophen, albuterol Assessment/Plan: Active Problems:   CAP (community acquired pneumonia)   COPD with acute lower respiratory infection  #Community acquired pneumonia with COPD exacerbation: Possible infiltrate on CXR. O2 sats dropped to 86% on RA with ambulation today. Breathing improved with Spiriva and Advair in the past. Influenza negative. -Duonebs q4h.  -Albuterol nebs q2h PRN.  -Prednisone 40 mg PO daily.  -  Levaquin 750mg  every other day (renally dose for creatinine clearance of 44) -PT/OT following -Consider restarting Advair and Spiriva at discharge.  -PFTs as outpatient.   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 No tenderness on palpation. Low suspicion for DVT.  -Heparin for DVT prophylaxis.   #Low back pain: Likely musculoskeletal lasting for a week. No new finding on x-ray or red flag symptoms. Tender to palpation.  -Tylenol PRN.   #Osteoporosis: Compression fractures notes in 2011, not on vitamin D and no DEXA scans.  -Check vit D 25 hydroxy.  -Start vitamin D supplementation.   #Hypokalemia: K+ 3.2. Mg normal. Secondary to nebs.  -Kdur 80 mEq.   #Hypertension: Normotensive overnight -Continue home amlodipine 10 mg daily.   #CKD stage 3: Likely secondary to hypertension. Cr 1.31 on admission, at baseline.  -Continue to monitor BMP.  -Renally dose Levaquin -Avoid NSAIDs.   #Tobacco abuse: Patient want to quit smoking, but not ready currently. Smoking 1/2 PPD.  -Nicotine patch.   #Thrush: Developed after starting albuterol inhaler.  -Continue nystatin suspension 4x daily.  This is a Careers information officer Note.  The care of the patient was discussed with Dr. Hayes Ludwig and the assessment and plan formulated with their assistance.  Please see their attached note for official documentation of the daily encounter.   LOS: 1 day   Ciro Backer, Med Student 02/21/2014, 11:16 AM

## 2014-02-26 LAB — CULTURE, BLOOD (ROUTINE X 2)
CULTURE: NO GROWTH
Culture: NO GROWTH

## 2014-03-01 ENCOUNTER — Encounter: Payer: Self-pay | Admitting: Internal Medicine

## 2014-03-01 ENCOUNTER — Ambulatory Visit (INDEPENDENT_AMBULATORY_CARE_PROVIDER_SITE_OTHER): Payer: Medicare Other | Admitting: Internal Medicine

## 2014-03-01 VITALS — BP 123/67 | HR 90 | Temp 98.3°F | Wt 180.7 lb

## 2014-03-01 DIAGNOSIS — Z23 Encounter for immunization: Secondary | ICD-10-CM

## 2014-03-01 DIAGNOSIS — Z720 Tobacco use: Secondary | ICD-10-CM | POA: Diagnosis not present

## 2014-03-01 DIAGNOSIS — J440 Chronic obstructive pulmonary disease with acute lower respiratory infection: Secondary | ICD-10-CM

## 2014-03-01 DIAGNOSIS — Z0000 Encounter for general adult medical examination without abnormal findings: Secondary | ICD-10-CM

## 2014-03-01 DIAGNOSIS — F17200 Nicotine dependence, unspecified, uncomplicated: Secondary | ICD-10-CM

## 2014-03-01 DIAGNOSIS — B370 Candidal stomatitis: Secondary | ICD-10-CM | POA: Diagnosis not present

## 2014-03-01 DIAGNOSIS — R05 Cough: Secondary | ICD-10-CM | POA: Diagnosis not present

## 2014-03-01 MED ORDER — ACETAMINOPHEN 500 MG PO TABS: 500.0000 mg | ORAL_TABLET | ORAL | Status: AC | PRN

## 2014-03-01 MED ORDER — NICOTINE 21 MG/24HR TD PT24: 21.0000 mg | MEDICATED_PATCH | TRANSDERMAL | Status: DC

## 2014-03-01 NOTE — Progress Notes (Signed)
Case discussed with Dr. Gill soon after the resident saw the patient.  We reviewed the resident's history and exam and pertinent patient test results.  I agree with the assessment, diagnosis, and plan of care documented in the resident's note. 

## 2014-03-01 NOTE — Patient Instructions (Addendum)
Thank you for your visit today.   Please return to the internal medicine clinic in November to see Dr. Ellwood Dense or sooner if needed.   I have sent you tylenol for your back pain to your pharmacy. I have also sent you nicotine patches to your pharmacy.   Please change this every day.    Your current medical regimen is effective;  continue present plan and take all medications as prescribed.    Please be sure to bring all of your medications with you to every visit; this includes herbal supplements, vitamins, eye drops, and any over-the-counter medications.   Should you have any questions regarding your medications and/or any new or worsening symptoms, please be sure to call the clinic at [phone].   If you believe that you are suffering from a life threatening condition or one that may result in the loss of limb or function, then you should call 911 or proceed to the nearest Emergency Department.   Nicotine skin patches What is this medicine? NICOTINE ([place] teen) helps people stop smoking. The patches replace the nicotine found in cigarettes and help to decrease withdrawal effects. They are most effective when used in combination with a stop-smoking program. This medicine may be used for other purposes; ask your health care provider or pharmacist if you have questions. COMMON BRAND NAME(S): Habitrol, Nicoderm CQ, Nicotrol What should I tell my health care provider before I take this medicine? They need to know if you have any of these conditions: -diabetes -heart disease, angina, irregular heartbeat or previous heart attack -lung disease, including asthma -overactive thyroid -pheochromocytoma -skin problems -stomach problems or ulcers -an unusual or allergic reaction to nicotine, adhesives, other medicines, foods, dyes, or preservatives -pregnant or trying to get pregnant -breast-feeding How should I use this medicine? This medicine is for use on the skin. Follow the directions  that come with the patches. Find an area of skin on your upper arm, chest, or back that is clean, dry, greaseless, undamaged and hairless. Wash hands with plain soap and water. Do not use anything that contains aloe, lanolin or glycerin as these may prevent the patch from sticking. Dry thoroughly. Remove the patch from the sealed pouch. Do not try to cut or trim the patch. Using your palm, press the patch firmly in place for 10 seconds to make sure that there is good contact with your skin. After applying the patch, wash your hands. Change the patch every day, keeping to a regular schedule. When you apply a new patch, use a new area of skin. Wait at least 1 week before using the same area again. Talk to your pediatrician regarding the use of this medicine in children. Special care may be needed. Overdosage: If you think you have taken too much of this medicine contact a poison control center or emergency room at once. NOTE: This medicine is only for you. Do not share this medicine with others. What if I miss a dose? If you forget to replace a patch, use it as soon as you can. Only use one patch at a time and do not leave on the skin for longer than directed. If a patch falls off, you can replace it, but keep to your schedule and remove the patch at the right time. What may interact with this medicine? -medicines for asthma -medicines for blood pressure -medicines for mental depression This list may not describe all possible interactions. Give your health care provider a list of all the  medicines, herbs, non-prescription drugs, or dietary supplements you use. Also tell them if you smoke, drink alcohol, or use illegal drugs. Some items may interact with your medicine. What should I watch for while using this medicine? You should begin using the nicotine patch the day you stop smoking. It is okay if you do not succeed at your attempt to quit and have a cigarette. You can still continue your quit attempt and  keep using the product as directed. Just throw away your cigarettes and get back to your quit plan. You can keep the patch in place during swimming, bathing, and showering. If your patch falls off during these activities, replace it. When you first apply the patch, your skin may itch or burn. This should soon go away. When you remove a patch, the skin may look red, but this should only last for a day. Call your doctor or health care professional if you get a permanent skin rash. If you are a diabetic and you quit smoking, the effects of insulin may be increased and you may need to reduce your insulin dose. Check with your doctor or health care professional about how you should adjust your insulin dose. If you are going to have a magnetic resonance imaging (MRI) procedure, tell your MRI technician if you have this patch on your body. It must be removed before a MRI. What side effects may I notice from receiving this medicine? Side effects that you should report to your doctor or health care professional as soon as possible: -allergic reactions like skin rash, itching or hives, swelling of the face, lips, or tongue -breathing problems -changes in hearing -changes in vision -chest pain -cold sweats -confusion -fast, irregular heartbeat -feeling faint or lightheaded, falls -headache -increased saliva -nausea, vomiting -skin redness that lasts more than 4 days -stomach pain -weakness Side effects that usually do not require medical attention (report to your doctor or health care professional if they continue or are bothersome): -diarrhea -dry mouth -hiccups -irritability -nervousness or restlessness -trouble sleeping or vivid dreams This list may not describe all possible side effects. Call your doctor for medical advice about side effects. You may report side effects to FDA at 1-800-FDA-1088. Where should I keep my medicine? Keep out of the reach of children. Store at room temperature  between 20 and 25 degrees C (68 and 77 degrees F). Protect from heat and light. Store in International aid/development worker until ready to use. Throw away unused medicine after the expiration date. When you remove a patch, fold with sticky sides together; put in an empty opened pouch and throw away. NOTE: This sheet is a summary. It may not cover all possible information. If you have questions about this medicine, talk to your doctor, pharmacist, or health care provider.  2015, [employer] Standard. (2013-05-18 12:51:27)

## 2014-03-01 NOTE — Progress Notes (Signed)
Patient ID: [name], female   DOB: [birth year], 73 y.o.   MRN: [id number]    Subjective:   Patient ID: [name] female    DOB: [birth year] 73 y.o.    MRN: [id number] Health Maintenance Due: Health Maintenance Due  Topic Date Due  . Zostavax  05/08/2000  . Mammogram  01/27/2007  . Influenza Vaccine  12/10/2013    _________________________________________________  HPI: [name] is a 73 y.o. female here for a hospital follow-up. Pt has a PMH outlined below.  Please see problem-based charting assessment and plan note for further details of medical issues addressed at today's visit.  PMH: Past Medical History  Diagnosis Date  . Hypertension   . HEARING LOSS   . 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 Arthritis   . DVT (deep venous thrombosis)   . Phlebitis   . CKD (chronic kidney disease) stage 3, GFR 30-59 ml/min     Medications: Current Outpatient Prescriptions on File Prior to Visit  Medication Sig Dispense Refill  . albuterol (PROVENTIL HFA;VENTOLIN HFA) 108 (90 BASE) MCG/ACT inhaler Inhale 2 puffs into the lungs every 6 (six) hours as needed for wheezing or shortness of breath.      Marland Kitchen amLODipine (NORVASC) 10 MG tablet Take 1 tablet (10 mg total) by mouth daily.  30 tablet  0  . mometasone-formoterol (DULERA) 200-5 MCG/ACT AERO Inhale 2 puffs into the lungs 2 (two) times daily.  13 g  12  . nystatin (MYCOSTATIN) 100000 UNIT/ML suspension Take 5 mLs (500,000 Units total) by mouth 4 (four) times daily.  60 mL  0  . tiotropium (SPIRIVA) 18 MCG inhalation capsule Place 1 capsule (18 mcg total) into inhaler and inhale daily.  30 capsule  12  . Vitamin D, Ergocalciferol, (DRISDOL) 50000 UNITS CAPS capsule Take 1 capsule (50,000 Units total) by mouth every 7 (seven) days.  4 capsule  0  . [DISCONTINUED] enoxaparin (LOVENOX) 80 MG/0.8ML SOLN injection Inject 0.8 mLs (80 mg total) into the skin every 12 (twelve) hours.  8 mL  0   No  current facility-administered medications on file prior to visit.    Allergies: Allergies  Allergen Reactions  . Codeine     REACTION: Pt c/o "feeling high"  . Morphine     REACTION: feels funny and "high"    FH: Family History  Problem Relation Age of Onset  . Cancer Maternal Uncle     colon  . Cancer Maternal Grandmother     liver  . Cancer Mother     liver  . Cancer Brother     Unknown  . Cancer Sister     Unknown    SH: History   Social History  . Marital Status: Divorced    Spouse Name: N/A    Number of Children: N/A  . Years of Education: N/A   Social History Main Topics  . Smoking status: Current Every Day Smoker -- 0.50 packs/day for 20 years    Types: Cigarettes  . Smokeless tobacco: Never Used     Comment: CUTTING BACK/ 8 CIGARETTES  . Alcohol Use: No  . Drug Use: No  . Sexual Activity: No   Other Topics Concern  . None   Social History Narrative   Patient currently lives at home alone, but does have a home worker who visits her throughout the week to help with things around the house.  Patient states that she  still tries to remain active and walks daily.  She is has been divorced for sometime and has one son.  She currently lives off social security.  Patient reports using approximately half a pack of cigarettes daily for 20 years.  She denies any alcohol or illicit drug use.  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      Review of Systems: Constitutional: Negative for fever, chills and weight loss.  Eyes: Negative for blurred vision.  Respiratory: Negative for cough and shortness of breath.  Cardiovascular: Negative for chest pain, palpitations and leg swelling.  Gastrointestinal: Negative for nausea, vomiting, abdominal pain, diarrhea, constipation and blood in stool.  Genitourinary: Negative for dysuria, urgency and frequency.  Musculoskeletal: Negative for myalgias and back pain.    Neurological: Negative for dizziness, weakness and headaches.     Objective:   Vital Signs: Filed Vitals:   03/01/14 1004  BP: 123/67  Pulse: 90  Temp: 98.3 F (36.8 C)  TempSrc: Oral  Weight: 180 lb 11.2 oz (81.965 kg)  SpO2: 96%      BP Readings from Last 3 Encounters:  03/01/14 123/67  02/21/14 142/64  02/17/14 132/72    Physical Exam: Constitutional: Vital signs reviewed.  Patient is well-developed and well-nourished in NAD and cooperative with exam.  Head: Normocephalic and atraumatic. Eyes: PERRL, EOMI, conjunctivae nl, no scleral icterus.  Neck: Supple. Cardiovascular: RRR, no MRG. Pulmonary/Chest: normal effort, CTAB, no wheezes, rales, or rhonchi. Abdominal: Soft. NT/ND +BS. Neurological: A&O x3, cranial nerves II-XII are grossly intact, moving all extremities. Extremities: 2+DP b/l; no pitting edema. Skin: Warm, dry and intact. No rash.   Assessment & Plan:   Assessment and plan was discussed and formulated with my attending.

## 2014-03-01 NOTE — Addendum Note (Signed)
Addended by: Jones Bales on: 03/01/2014 12:00 PM   Modules accepted: Level of Service

## 2014-03-01 NOTE — Assessment & Plan Note (Signed)
-  prescribed nicotine patches today

## 2014-03-01 NOTE — Assessment & Plan Note (Addendum)
-  continue VitD-->will need to continue VitD daily at least 1000 units after completion of 4 weeks of 50,000 units started in hospital -influenza vaccine given today -will need prevnar at next OV

## 2014-03-01 NOTE — Assessment & Plan Note (Signed)
Pt recently discharged from [facility] due to mild COPD exacerbation.  She is doing well and completed course of levaquin and prednisone.  Lungs are clear without wheezing on exam.  She is interested in quitting smoking.  She continues to use her inhalers spiriva and dulera and albuterol PRN. -continue inhalers -prescription sent to pharmacy for nicotine patches today -influenza vaccine given today -will need prevnar at next OV

## 2014-03-07 NOTE — Addendum Note (Signed)
Addended by: Orson Gear on: 03/07/2014 02:11 PM   Modules accepted: Orders

## 2014-03-28 ENCOUNTER — Encounter: Payer: Self-pay | Admitting: Internal Medicine

## 2014-03-28 ENCOUNTER — Ambulatory Visit (INDEPENDENT_AMBULATORY_CARE_PROVIDER_SITE_OTHER): Payer: Medicare Other | Admitting: Internal Medicine

## 2014-03-28 VITALS — BP 129/71 | HR 80 | Temp 97.8°F | Ht 60.0 in | Wt 177.2 lb

## 2014-03-28 DIAGNOSIS — I10 Essential (primary) hypertension: Secondary | ICD-10-CM

## 2014-03-28 DIAGNOSIS — Z720 Tobacco use: Secondary | ICD-10-CM | POA: Diagnosis not present

## 2014-03-28 DIAGNOSIS — R05 Cough: Secondary | ICD-10-CM | POA: Diagnosis not present

## 2014-03-28 DIAGNOSIS — B370 Candidal stomatitis: Secondary | ICD-10-CM | POA: Diagnosis not present

## 2014-03-28 DIAGNOSIS — E559 Vitamin D deficiency, unspecified: Secondary | ICD-10-CM | POA: Diagnosis not present

## 2014-03-28 DIAGNOSIS — F17200 Nicotine dependence, unspecified, uncomplicated: Secondary | ICD-10-CM

## 2014-03-28 DIAGNOSIS — Z0000 Encounter for general adult medical examination without abnormal findings: Secondary | ICD-10-CM

## 2014-03-28 DIAGNOSIS — J440 Chronic obstructive pulmonary disease with acute lower respiratory infection: Secondary | ICD-10-CM

## 2014-03-28 NOTE — Patient Instructions (Signed)
Ms Zelpha, Messing to meet you.  Please keep taking your BP meds. Please see your foot doctor for your calluses.  Next time we get PREVNAR shot. We will do some blood work next time - you refused it this time.   See you in 3 months  Thanks, [name] MD MPH 03/28/2014 11:10 AM

## 2014-03-29 ENCOUNTER — Encounter: Payer: Self-pay | Admitting: Internal Medicine

## 2014-03-29 DIAGNOSIS — E559 Vitamin D deficiency, unspecified: Secondary | ICD-10-CM | POA: Insufficient documentation

## 2014-03-29 MED ORDER — CHOLECALCIFEROL 10 MCG (400 UNIT) PO CAPS: 2.0000 | ORAL_CAPSULE | Freq: Every day | ORAL | Status: DC

## 2014-03-29 NOTE — Assessment & Plan Note (Signed)
Brief counseling about tobacco use. The patient does not seem very motivated to quit. However, she does have nicotine patches which she is currently not using.

## 2014-03-29 NOTE — Assessment & Plan Note (Signed)
Under control on Amlodipine 10mg  daily. Continue current management.

## 2014-03-29 NOTE — Progress Notes (Signed)
Subjective:     Patient ID: [name], female   DOB: Sep 10, 1940, 73 y.o.   MRN: [id number]  HPI [name] is a 73 year old african [place] female, who follows with me basically for hypertension, CKD and preventive care. Today, she comes in for routine follow up and is in a hurry to leave. She has no complaints. She is a current smoker, and is smoking 5-6 cigarrettes a day despite having nicotine patches with her. She reports that nicotine patches make her feel funny. She reports compliance with her medications. She has completed the levofloxacin course prescribed last visit, and reports improvement in her URI symptoms from last time. Patient had brought empty bottles of levofloxacin and prednisone and was confused if she needs a refill.  Review of Systems  Constitutional: Negative.   Respiratory: Negative.   Cardiovascular: Negative.   Gastrointestinal: Negative.   Skin: Negative for rash.       Objective:   Physical Exam  Constitutional: She is oriented to person, place, and time. She appears well-developed and well-nourished. No distress.  HENT:  Head: Normocephalic and atraumatic.  Mouth/Throat: Oropharynx is clear and moist.  Eyes: Conjunctivae and EOM are normal. Pupils are equal, round, and reactive to light.  Cardiovascular: Normal rate, regular rhythm and normal heart sounds.   No murmur heard. Pulmonary/Chest: Effort normal and breath sounds normal. She has no wheezes. She has no rales.  Abdominal: Soft.  Lymphadenopathy:    She has no cervical adenopathy.  Neurological: She is alert and oriented to person, place, and time.  Skin: Skin is warm.  Did not allow examination of legs.     Assessment & Plan:    Please see problem based charting.

## 2014-03-29 NOTE — Assessment & Plan Note (Signed)
She has completed the 4 capsule course give by Dr Hayes Ludwig for Vitamin D 50,000 units weekly.  I will change it Vitamin D3 800 units daily OTC.

## 2014-03-29 NOTE — Assessment & Plan Note (Signed)
Improved. Collected empty pill bottles, and informed patient that she does not need to take antibiotic and prednisone any more. Patient verbalized understanding/

## 2014-03-29 NOTE — Assessment & Plan Note (Signed)
The patient refused any preventive health care (Prevnar 13, blood work) today.  She seeks foot care at [facility] on [address].

## 2014-04-10 ENCOUNTER — Other Ambulatory Visit: Payer: Self-pay | Admitting: *Deleted

## 2014-04-10 MED ORDER — AMLODIPINE BESYLATE 10 MG PO TABS: 10.0000 mg | ORAL_TABLET | Freq: Every day | ORAL | Status: DC

## 2014-06-13 DIAGNOSIS — L84 Corns and callosities: Secondary | ICD-10-CM | POA: Diagnosis not present

## 2014-06-13 DIAGNOSIS — L603 Nail dystrophy: Secondary | ICD-10-CM | POA: Diagnosis not present

## 2014-06-13 DIAGNOSIS — I739 Peripheral vascular disease, unspecified: Secondary | ICD-10-CM | POA: Diagnosis not present

## 2014-07-11 ENCOUNTER — Ambulatory Visit (HOSPITAL_COMMUNITY)
Admission: RE | Admit: 2014-07-11 | Discharge: 2014-07-11 | Disposition: A | Discharge status: Home / Self Care | Payer: Medicare Other | Source: Ambulatory Visit | Attending: Internal Medicine | Admitting: Internal Medicine

## 2014-07-11 ENCOUNTER — Ambulatory Visit (INDEPENDENT_AMBULATORY_CARE_PROVIDER_SITE_OTHER): Payer: Medicare Other | Admitting: Internal Medicine

## 2014-07-11 ENCOUNTER — Encounter: Payer: Self-pay | Admitting: Internal Medicine

## 2014-07-11 VITALS — BP 125/72 | HR 90 | Temp 97.5°F | Wt 176.4 lb

## 2014-07-11 DIAGNOSIS — N183 Chronic kidney disease, stage 3 unspecified: Secondary | ICD-10-CM

## 2014-07-11 DIAGNOSIS — I129 Hypertensive chronic kidney disease with stage 1 through stage 4 chronic kidney disease, or unspecified chronic kidney disease: Secondary | ICD-10-CM | POA: Diagnosis not present

## 2014-07-11 DIAGNOSIS — R1084 Generalized abdominal pain: Secondary | ICD-10-CM | POA: Diagnosis not present

## 2014-07-11 DIAGNOSIS — K5900 Constipation, unspecified: Secondary | ICD-10-CM | POA: Insufficient documentation

## 2014-07-11 DIAGNOSIS — R946 Abnormal results of thyroid function studies: Secondary | ICD-10-CM | POA: Diagnosis not present

## 2014-07-11 DIAGNOSIS — R109 Unspecified abdominal pain: Secondary | ICD-10-CM | POA: Insufficient documentation

## 2014-07-11 DIAGNOSIS — R7989 Other specified abnormal findings of blood chemistry: Secondary | ICD-10-CM

## 2014-07-11 DIAGNOSIS — Z87891 Personal history of nicotine dependence: Secondary | ICD-10-CM

## 2014-07-11 DIAGNOSIS — I10 Essential (primary) hypertension: Secondary | ICD-10-CM

## 2014-07-11 HISTORY — DX: Unspecified abdominal pain: R10.9

## 2014-07-11 LAB — BASIC METABOLIC PANEL WITH GFR
BUN: 25 mg/dL — AB (ref 6–23)
CALCIUM: 9.8 mg/dL (ref 8.4–10.5)
CO2: 25 mEq/L (ref 19–32)
CREATININE: 1.34 mg/dL — AB (ref 0.50–1.10)
Chloride: 102 mEq/L (ref 96–112)
GFR, EST AFRICAN AMERICAN: 45 mL/min — AB
GFR, Est Non African American: 39 mL/min — ABNORMAL LOW
Glucose, Bld: 103 mg/dL — ABNORMAL HIGH (ref 70–99)
Potassium: 4.5 mEq/L (ref 3.5–5.3)
Sodium: 139 mEq/L (ref 135–145)

## 2014-07-11 LAB — TSH: TSH: 5.791 u[IU]/mL — AB (ref 0.350–4.500)

## 2014-07-11 MED ORDER — POLYETHYLENE GLYCOL 3350 17 G PO PACK: 17.0000 g | PACK | Freq: Every day | ORAL | Status: DC

## 2014-07-11 MED ORDER — SENNA-DOCUSATE SODIUM 8.6-50 MG PO TABS: 2.0000 | ORAL_TABLET | Freq: Every day | ORAL | Status: DC

## 2014-07-11 MED ORDER — POLYETHYLENE GLYCOL 3350 17 GM/SCOOP PO POWD: 17.0000 g | Freq: Once | ORAL | Status: DC

## 2014-07-11 MED ORDER — NICOTINE 21 MG/24HR TD PT24: 21.0000 mg | MEDICATED_PATCH | TRANSDERMAL | Status: DC

## 2014-07-11 NOTE — Assessment & Plan Note (Addendum)
Assessment: Abdominal Pain secondary to constipation. On my exam, the patient's abdomen does not appear to be extremely tender. There is mild bloating. She is passing gas, and she is not reporting any nausea and vomiting. Patient financially unable to afford laxatives. I am told that the clinic does not do enema on patients. Unwilling to go to the ER at this time as she fears she will miss her SCAT ride back home.   Plan:  Abdominal 2 view X ray does not show any obstruction/perforation or other pathology. Some feces noted.   Given the financial problem - we got Senna from [facility] - and gave the patient money for buying Miralax. RN Ulis Rias called the patient's pharmacy to establish co-ordination. [facility] had run out of Miralax.   The patient has been instructed that if she does not have a BM in 1-2 days, she should get back in touch.   If her abdominal pain increases or she has nausea and vomiting, she should go to the ER or call 911. The patient voices understanding.  ADDENDUM Work up for abdominal pain and chronic constipation:   TSH 5.791* 07/11/2014   I will call the patient today close to evening to enquire about her well being and ask her to come in for repeat testing of TSH and Free T4 to ascertain possible hypothyroidism. We will do a more detailed review of hypothyroidism related symptoms on that visit.

## 2014-07-11 NOTE — Progress Notes (Signed)
   Subjective:    Patient ID: [name], female    DOB: 1940-12-25, 74 y.o.   MRN: [id number]  HPI 74 year old lady with chronic constipation coming with generalized abdominal pain - started 3 days back, progressive, about 8-10/10 intensity according to the patient. She has not had a BM in over a week. She usually goes once in 2 days. She is passing lots of gas. She feels bloated. She has not tried any laxatives and says that she has been progressively trying to eat more and more to build pressure. However she has been unsuccessful. No nausea or vomiting reported.  Additionally, she says that she does not have any money to buy laxatives. She has $5 with which she would like to buy her prescription medications.  Would like to get her nicotine patches as they have been helping her stay away from smoking.  No other complaints voiced this visit.  Review of Systems  Constitutional: Negative for fever, chills, diaphoresis and fatigue.  Respiratory: Negative for cough, chest tightness and wheezing.   Cardiovascular: Negative for chest pain, palpitations and leg swelling.  Gastrointestinal: Positive for abdominal pain, constipation and abdominal distention. Negative for nausea, vomiting and rectal pain.  Neurological: Negative for dizziness and headaches.       Objective:   Physical Exam  Constitutional: She is oriented to person, place, and time. She appears well-developed and well-nourished. No distress.  HENT:  Head: Normocephalic and atraumatic.  Eyes: Conjunctivae are normal. Pupils are equal, round, and reactive to light.  Cardiovascular: Normal rate, regular rhythm and normal heart sounds.   No murmur heard. Pulmonary/Chest: Effort normal and breath sounds normal. No respiratory distress. She has no wheezes. She has no rales.  Abdominal: Soft. Bowel sounds are normal. She exhibits distension (mild). She exhibits no mass. There is tenderness (mild TTP all quadrants, patient does not  wince at all when I palpate). There is no rebound and no guarding.  Neurological: She is alert and oriented to person, place, and time.  Skin: Skin is warm. She is not diaphoretic.      Assessment & Plan:   Please see problem based charting.

## 2014-07-11 NOTE — Assessment & Plan Note (Signed)
BP Readings from Last 3 Encounters:  07/11/14 125/72  03/28/14 129/71  03/01/14 123/67   Well controlled.

## 2014-07-11 NOTE — Patient Instructions (Addendum)
[name]   Take Miralax - 1 dose this evening and then 1 dose in the morning tomorrow, and see if it helps your constipation.  Take Senna as prescribed.  If nothing helps by tomorrow evening, please call the clinic the next day. If your belly pain increases, or you have nausea or vomiting, call 911 or come to the ER.   Thanks, [name] MD MPH 07/11/2014 11:53 AM

## 2014-07-11 NOTE — Assessment & Plan Note (Signed)
Nicotine patches reordered.

## 2014-07-11 NOTE — Assessment & Plan Note (Signed)
BMP for routine monitoring of CKD.

## 2014-07-12 LAB — T4, FREE: Free T4: 1 ng/dL (ref 0.80–1.80)

## 2014-07-12 NOTE — Addendum Note (Signed)
Addended by: [name] on: 07/12/2014 09:43 AM   Modules accepted: Orders

## 2014-07-12 NOTE — Addendum Note (Signed)
Addended by: [name] on: 07/12/2014 04:02 PM   Modules accepted: Orders

## 2014-07-13 LAB — THYROID PEROXIDASE ANTIBODY: Thyroperoxidase Ab SerPl-aCnc: 20 IU/mL — ABNORMAL HIGH (ref <9)

## 2014-07-17 ENCOUNTER — Encounter: Payer: Self-pay | Admitting: *Deleted

## 2014-07-17 NOTE — Assessment & Plan Note (Signed)
The patient does have baseline constipation and her normal pattern in 1 BM per 2-3 days.   ADDENDUM 07/17/2014 8:58 am RN Despina Hidden called the patient and it was revealed that the patient had a BM with the above treatment and no longer has abdominal pain. However, she is still struggling with constipation, and is taking Miralax and Senna daily with BMs every 2-3 days.   Thyroid Panel   TSH 5.791* 07/11/2014    Free T4 1.00 07/11/2014    TPO antibodies 20* 07/11/2014   I would like to call her back for a follow up visit, and do a review of systems with hypothyroidism backdrop. 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

## 2014-07-19 ENCOUNTER — Telehealth: Payer: Self-pay | Admitting: Internal Medicine

## 2014-07-19 NOTE — Telephone Encounter (Signed)
Call to patient to confirm appointment for 07/20/14 at 10:45. [email or web address]

## 2014-07-20 ENCOUNTER — Ambulatory Visit: Payer: Medicare Other | Admitting: Internal Medicine

## 2014-08-17 DIAGNOSIS — H5010 Unspecified exotropia: Secondary | ICD-10-CM | POA: Diagnosis not present

## 2014-08-17 DIAGNOSIS — H5500 Unspecified nystagmus: Secondary | ICD-10-CM | POA: Diagnosis not present

## 2014-08-17 DIAGNOSIS — Z961 Presence of intraocular lens: Secondary | ICD-10-CM | POA: Diagnosis not present

## 2014-08-17 DIAGNOSIS — Z947 Corneal transplant status: Secondary | ICD-10-CM | POA: Diagnosis not present

## 2014-08-24 DIAGNOSIS — L603 Nail dystrophy: Secondary | ICD-10-CM | POA: Diagnosis not present

## 2014-08-24 DIAGNOSIS — L84 Corns and callosities: Secondary | ICD-10-CM | POA: Diagnosis not present

## 2014-08-24 DIAGNOSIS — I739 Peripheral vascular disease, unspecified: Secondary | ICD-10-CM | POA: Diagnosis not present

## 2014-09-11 ENCOUNTER — Other Ambulatory Visit: Payer: Self-pay | Admitting: *Deleted

## 2014-09-13 MED ORDER — NICOTINE 21 MG/24HR TD PT24: 21.0000 mg | MEDICATED_PATCH | TRANSDERMAL | Status: DC

## 2014-09-24 ENCOUNTER — Encounter (HOSPITAL_COMMUNITY): Payer: Self-pay

## 2014-09-24 ENCOUNTER — Emergency Department (HOSPITAL_COMMUNITY)
Admit: 2014-09-24 | Discharge: 2014-09-24 | Disposition: A | Discharge status: Home / Self Care | Payer: Medicare Other | Attending: Emergency Medicine | Admitting: Emergency Medicine

## 2014-09-24 ENCOUNTER — Emergency Department (HOSPITAL_COMMUNITY)
Admission: EM | Admit: 2014-09-24 | Discharge: 2014-09-24 | Disposition: A | Discharge status: Home / Self Care | Payer: Medicare Other | Attending: Emergency Medicine | Admitting: Emergency Medicine

## 2014-09-24 DIAGNOSIS — H9190 Unspecified hearing loss, unspecified ear: Secondary | ICD-10-CM | POA: Insufficient documentation

## 2014-09-24 DIAGNOSIS — Z79899 Other long term (current) drug therapy: Secondary | ICD-10-CM | POA: Diagnosis not present

## 2014-09-24 DIAGNOSIS — M79662 Pain in left lower leg: Secondary | ICD-10-CM | POA: Diagnosis not present

## 2014-09-24 DIAGNOSIS — M1990 Unspecified osteoarthritis, unspecified site: Secondary | ICD-10-CM | POA: Insufficient documentation

## 2014-09-24 DIAGNOSIS — H540 Blindness, both eyes: Secondary | ICD-10-CM | POA: Diagnosis not present

## 2014-09-24 DIAGNOSIS — R609 Edema, unspecified: Secondary | ICD-10-CM

## 2014-09-24 DIAGNOSIS — N183 Chronic kidney disease, stage 3 (moderate): Secondary | ICD-10-CM | POA: Insufficient documentation

## 2014-09-24 DIAGNOSIS — Z720 Tobacco use: Secondary | ICD-10-CM | POA: Diagnosis not present

## 2014-09-24 DIAGNOSIS — R600 Localized edema: Secondary | ICD-10-CM | POA: Diagnosis not present

## 2014-09-24 DIAGNOSIS — R2242 Localized swelling, mass and lump, left lower limb: Secondary | ICD-10-CM | POA: Diagnosis not present

## 2014-09-24 DIAGNOSIS — Z86718 Personal history of other venous thrombosis and embolism: Secondary | ICD-10-CM | POA: Diagnosis not present

## 2014-09-24 DIAGNOSIS — I129 Hypertensive chronic kidney disease with stage 1 through stage 4 chronic kidney disease, or unspecified chronic kidney disease: Secondary | ICD-10-CM | POA: Diagnosis not present

## 2014-09-24 NOTE — Progress Notes (Signed)
VASCULAR LAB PRELIMINARY  PRELIMINARY  PRELIMINARY  PRELIMINARY  Left lower extremity venous Doppler completed.    Preliminary report:  There is no DVT or SVT noted in the left lower extremity.   [name], RVT 09/24/2014, 12:31 PM

## 2014-09-24 NOTE — ED Notes (Signed)
Vascular at the bedside. 

## 2014-09-24 NOTE — ED Notes (Signed)
To room via EMS. Pt reports left  Lower leg/foot pain and swelling x 2 weeks.  Pain worsened today.   Has not been evaluated by PCP.  EMS BP 150/80, HR 100, RR 16, SpO2 95%.  Pt is legally  blind and HOH.

## 2014-09-24 NOTE — Discharge Instructions (Signed)
Edema °Edema is an abnormal buildup of fluids in your body tissues. Edema is somewhat dependent on gravity to pull the fluid to the lowest place in your body. That makes the condition more common in the legs and thighs (lower extremities). Painless swelling of the feet and ankles is common and becomes more likely as you get older. It is also common in looser tissues, like around your eyes.  °When the affected area is squeezed, the fluid may move out of that spot and leave a dent for a few moments. This dent is called pitting.  °CAUSES  °There are many possible causes of edema. Eating too much salt and being on your feet or sitting for a long time can cause edema in your legs and ankles. Hot weather may make edema worse. Common medical causes of edema include: °· Heart failure. °· Liver disease. °· Kidney disease. °· Weak blood vessels in your legs. °· Cancer. °· An injury. °· Pregnancy. °· Some medications. °· Obesity.  °SYMPTOMS  °Edema is usually painless. Your skin may look swollen or shiny.  °DIAGNOSIS  °Your health care provider may be able to diagnose edema by asking about your medical history and doing a physical exam. You may need to have tests such as X-rays, an electrocardiogram, or blood tests to check for medical conditions that may cause edema.  °TREATMENT  °Edema treatment depends on the cause. If you have heart, liver, or kidney disease, you need the treatment appropriate for these conditions. General treatment may include: °· Elevation of the affected body part above the level of your heart. °· Compression of the affected body part. Pressure from elastic bandages or support stockings squeezes the tissues and forces fluid back into the blood vessels. This keeps fluid from entering the tissues. °· Restriction of fluid and salt intake. °· Use of a water pill (diuretic). These medications are appropriate only for some types of edema. They pull fluid out of your body and make you urinate more often. This  gets rid of fluid and reduces swelling, but diuretics can have side effects. Only use diuretics as directed by your health care provider. °HOME CARE INSTRUCTIONS  °· Keep the affected body part above the level of your heart when you are lying down.   °· Do not sit still or stand for prolonged periods.   °· Do not put anything directly under your knees when lying down. °· Do not wear constricting clothing or garters on your upper legs.   °· Exercise your legs to work the fluid back into your blood vessels. This may help the swelling go down.   °· Wear elastic bandages or support stockings to reduce ankle swelling as directed by your health care provider.   °· Eat a low-salt diet to reduce fluid if your health care provider recommends it.   °· Only take medicines as directed by your health care provider.  °SEEK MEDICAL CARE IF:  °· Your edema is not responding to treatment. °· You have heart, liver, or kidney disease and notice symptoms of edema. °· You have edema in your legs that does not improve after elevating them.   °· You have sudden and unexplained weight gain. °SEEK IMMEDIATE MEDICAL CARE IF:  °· You develop shortness of breath or chest pain.   °· You cannot breathe when you lie down. °· You develop pain, redness, or warmth in the swollen areas.   °· You have heart, liver, or kidney disease and suddenly get edema. °· You have a fever and your symptoms suddenly get worse. °MAKE SURE YOU:  °·   Understand these instructions.  Will watch your condition.  Will get help right away if you are not doing well or get worse. Document Released: 04/28/2005 Document Revised: 09/12/2013 Document Reviewed: 02/18/2013 [facility] Patient Information 2015 [place], [place]. This information is not intended to replace advice given to you by your health care provider. Make sure you discuss any questions you have with your health care provider.  Please continue to elevate her extremity, avoid aggravating activities. Return to  emergency room if any new or worsening signs or symptoms present. Please follow-up with the primary care provider for further evaluation and management.

## 2014-09-24 NOTE — ED Provider Notes (Signed)
CSN: [id number]     Arrival date & time 09/24/14  1146 History   First MD Initiated Contact with Patient 09/24/14 1155     Chief Complaint  Patient presents with  . Leg Swelling  . Leg Pain   HPI   74 year old female presents today with 2 week history of intermittent left leg swelling. Patient reports that she has baseline lower extremity edema, worse during the day but her at night. Noted that over the last 2 weeks her left leg has become more swollen than the right during the daytime and active hours. She states that at night she keeps it elevated and wakes up with equal extremities bilateral. Patient reports with the swelling she has associated pain in the foot to the mid tibia. Patient denies trauma to the extremity, denies pain up the remainder of the leg, pain in the back. Patient denies headache, shortness of breath, orthopnea, chest pain, nausea, vomiting, abdominal pain, back pain, or any other concerning signs or symptoms. Patient also reports a long-standing history of left shoulder pain for which she's had "bone spurs" taken out of it. She reports continued pain with flexion extension abduction and abduction, pain 0 out of 10 at rest; not associated with any concerning signs or symptoms including shortness of breath chest pain diaphoresis. Patient has significant past medical history of a small nonocclusive DVT in her right distal extremity.  Past Medical History  Diagnosis Date  . Hypertension   . HEARING LOSS   . 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 Arthritis   . DVT (deep venous thrombosis)   . Phlebitis   . CKD (chronic kidney disease) stage 3, GFR 30-59 ml/min    Past Surgical History  Procedure Laterality Date  . Eye surgery    . Shoulder open rotator cuff repair  1973    right shoulder, secondary numbness in R hand  . Foot surgery    . Right wrist     Family History  Problem Relation Age of Onset  . Cancer Maternal Uncle     colon  .  Cancer Maternal Grandmother     liver  . Cancer Mother     liver  . Cancer Brother     Unknown  . Cancer Sister     Unknown   History  Substance Use Topics  . Smoking status: Current Every Day Smoker -- 1.00 packs/day for 20 years    Types: Cigarettes  . Smokeless tobacco: Never Used     Comment: last cig 07/04/2014  . Alcohol Use: No   OB History    No data available     Review of Systems  All other systems reviewed and are negative.   Allergies  Codeine and Morphine  Home Medications   Prior to Admission medications   Medication Sig Start Date End Date Taking? Authorizing Provider  acetaminophen (TYLENOL) 500 MG tablet Take 1 tablet (500 mg total) by mouth every 4 (four) hours as needed. 03/01/14 03/01/15  Jones Bales, MD  albuterol (PROVENTIL HFA;VENTOLIN HFA) 108 (90 BASE) MCG/ACT inhaler Inhale 2 puffs into the lungs every 6 (six) hours as needed for wheezing or shortness of breath.    Historical Provider, MD  amLODipine (NORVASC) 10 MG tablet Take 1 tablet (10 mg total) by mouth daily. 04/10/14   [name], MD  Cholecalciferol ([employer] VITAMIN D3) 400 UNITS CAPS Take 2 capsules (800 Units total) by mouth daily. 03/29/14  [name], MD  mometasone-formoterol (DULERA) 200-5 MCG/ACT AERO Inhale 2 puffs into the lungs 2 (two) times daily. 02/21/14   Blain Pais, MD  nicotine (NICODERM CQ - DOSED IN MG/24 HOURS) 21 mg/24hr patch Place 1 patch (21 mg total) onto the skin daily. 09/13/14   [name], MD  nystatin (MYCOSTATIN) 100000 UNIT/ML suspension Take 5 mLs (500,000 Units total) by mouth 4 (four) times daily. 02/17/14   Wilber Oliphant, MD  polyethylene glycol powder (GLYCOLAX/MIRALAX) powder Take 17 g by mouth once. 07/11/14   [name], MD  sennosides-docusate sodium (SENOKOT-S) 8.6-50 MG tablet Take 2 tablets by mouth daily. 07/11/14   [name], MD  tiotropium (SPIRIVA) 18 MCG inhalation capsule Place 1 capsule (18 mcg total) into  inhaler and inhale daily. 02/21/14   Blain Pais, MD   There were no vitals taken for this visit.   Physical Exam  Constitutional: She is oriented to person, place, and time. She appears well-developed and well-nourished.  HENT:  Head: Normocephalic and atraumatic.  Neck: Normal range of motion. Neck supple. No JVD present. No tracheal deviation present. No thyromegaly present.  Cardiovascular: Normal rate, regular rhythm, normal heart sounds and intact distal pulses.  Exam reveals no gallop and no friction rub.   No murmur heard. Pulmonary/Chest: Effort normal and breath sounds normal. No stridor. No respiratory distress. She has no wheezes. She has no rales. She exhibits no tenderness.  Musculoskeletal: Normal range of motion.  Edema of the left lower extremity, bilateral distal pulses intact, no signs of infection, nontender to palpation. C-spine T-spine and L-spine nontender to palpation for range of motion including hip knee ankle. Neurovascular intact distal extremities  Left shoulder pain with both passive and active range of motion, sensation intact distal pulses intact. Symmetrical no obvious signs of trauma, deformity.  Lymphadenopathy:    She has no cervical adenopathy.  Neurological: She is alert and oriented to person, place, and time. Coordination normal.  Skin: Skin is warm and dry.  Psychiatric: She has a normal mood and affect. Her behavior is normal. Judgment and thought content normal.  Nursing note and vitals reviewed.   ED Course  Procedures (including critical care time) Labs Review Labs Reviewed - No data to display  Imaging Review No results found.   EKG Interpretation None      MDM   Final diagnoses:  Edema   Imaging: LE venous negative  Consults: none   Therapeutics: none  Assessment: Edema  Plan: Patient presents with lower extremity edema bilaterally worse on the left last 2 weeks. She reports this is relieved with elevation, is  associated with pain. Patient has significant past medical history of a small nonocclusive DVT on the right 2003, but has not had any similar episodes recently.  Patient denies chest pain, shortness of breath, orthopnea or any other concerning signs or symptoms. She remained stable throughout her stay in the ED, she was not to, tachycardic, or hypoxic, and continued to deny any cardiorespiratory complaints. Patient does report left shoulder pain history significant for arthroscopic surgery removing "bone spurs". She reports this pain is only present with active range of motion of the shoulder, 0-10 pain at rest. No concerning findings on physical exam. Patient is discharged home with instructions to elevate extremity, monitor for new or worsening signs or symptoms and return to emergency room if any present. Struck to follow-up with her primary care provider for further evaluation and management. Patient verbalized her understanding today's plan  and had no further concerns at time of discharge.      Okey Regal, PA-C 09/24/14 [place], DO 09/24/14 1600

## 2014-09-24 NOTE — ED Notes (Signed)
MD Hedges at the bedside.

## 2014-10-18 NOTE — Telephone Encounter (Signed)
A user error has taken place: encounter opened in error, closed for administrative reasons, orders placed in error, not carried out on this patient//kg.

## 2014-10-30 ENCOUNTER — Encounter: Payer: Self-pay | Admitting: *Deleted

## 2014-11-02 ENCOUNTER — Ambulatory Visit (INDEPENDENT_AMBULATORY_CARE_PROVIDER_SITE_OTHER): Payer: Medicare Other | Admitting: Internal Medicine

## 2014-11-02 ENCOUNTER — Encounter: Payer: Self-pay | Admitting: Internal Medicine

## 2014-11-02 ENCOUNTER — Ambulatory Visit (HOSPITAL_COMMUNITY)
Admission: RE | Admit: 2014-11-02 | Discharge: 2014-11-02 | Disposition: A | Discharge status: Home / Self Care | Payer: Medicare Other | Source: Ambulatory Visit | Attending: Internal Medicine | Admitting: Internal Medicine

## 2014-11-02 VITALS — BP 119/67 | HR 82 | Temp 98.0°F | Wt 179.8 lb

## 2014-11-02 DIAGNOSIS — I10 Essential (primary) hypertension: Secondary | ICD-10-CM | POA: Diagnosis not present

## 2014-11-02 DIAGNOSIS — Z0000 Encounter for general adult medical examination without abnormal findings: Secondary | ICD-10-CM

## 2014-11-02 DIAGNOSIS — M19031 Primary osteoarthritis, right wrist: Secondary | ICD-10-CM | POA: Diagnosis not present

## 2014-11-02 DIAGNOSIS — M25531 Pain in right wrist: Secondary | ICD-10-CM | POA: Insufficient documentation

## 2014-11-02 DIAGNOSIS — Z8781 Personal history of (healed) traumatic fracture: Secondary | ICD-10-CM | POA: Diagnosis not present

## 2014-11-02 DIAGNOSIS — E559 Vitamin D deficiency, unspecified: Secondary | ICD-10-CM

## 2014-11-02 DIAGNOSIS — Z13820 Encounter for screening for osteoporosis: Secondary | ICD-10-CM

## 2014-11-02 DIAGNOSIS — F17200 Nicotine dependence, unspecified, uncomplicated: Secondary | ICD-10-CM | POA: Diagnosis not present

## 2014-11-02 DIAGNOSIS — I739 Peripheral vascular disease, unspecified: Secondary | ICD-10-CM | POA: Insufficient documentation

## 2014-11-02 DIAGNOSIS — M4856XD Collapsed vertebra, not elsewhere classified, lumbar region, subsequent encounter for fracture with routine healing: Secondary | ICD-10-CM | POA: Diagnosis not present

## 2014-11-02 DIAGNOSIS — S32000S Wedge compression fracture of unspecified lumbar vertebra, sequela: Secondary | ICD-10-CM

## 2014-11-02 LAB — BASIC METABOLIC PANEL WITH GFR
BUN: 18 mg/dL (ref 6–23)
CALCIUM: 9.9 mg/dL (ref 8.4–10.5)
CHLORIDE: 100 meq/L (ref 96–112)
CO2: 26 meq/L (ref 19–32)
Creat: 1.33 mg/dL — ABNORMAL HIGH (ref 0.50–1.10)
GFR, Est African American: 45 mL/min — ABNORMAL LOW
GFR, Est Non African American: 39 mL/min — ABNORMAL LOW
GLUCOSE: 90 mg/dL (ref 70–99)
POTASSIUM: 4.2 meq/L (ref 3.5–5.3)
SODIUM: 139 meq/L (ref 135–145)

## 2014-11-02 MED ORDER — VITAMIN D (CHOLECALCIFEROL) 25 MCG (1000 UT) PO CAPS: 1.0000 | ORAL_CAPSULE | Freq: Every day | ORAL | Status: DC

## 2014-11-02 MED ORDER — NICOTINE 14 MG/24HR TD PT24: 14.0000 mg | MEDICATED_PATCH | TRANSDERMAL | Status: DC

## 2014-11-02 MED ORDER — AMLODIPINE BESYLATE 10 MG PO TABS: 10.0000 mg | ORAL_TABLET | Freq: Every day | ORAL | Status: DC

## 2014-11-02 NOTE — Assessment & Plan Note (Signed)
-   I discussed with her the importance of a dexa scan, she is in agreement.

## 2014-11-02 NOTE — Progress Notes (Signed)
[facility] Subjective:   Patient ID: [name] female   DOB: [birth year] 74 y.o.   MRN: [id number]  HPI: [name] is a 74 y.o. female with a PMH detailed below who presents for right wrist pain.  She notes that she broke her wrist a few years ago and had pins placed,  It has always hurt some but that for the past 3 weeks it has hurt more than it was.  She currently rates her pain at 8/10.  She has been taking tylenol which helps her pain.  She denies any recent trauma.    She also asks for a shower chair as it has been difficult for her to bath herself lately.    Past Medical History  Diagnosis Date  . Hypertension   . HEARING LOSS   . 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 Arthritis   . DVT (deep venous thrombosis)   . Phlebitis   . CKD (chronic kidney disease) stage 3, GFR 30-59 ml/min    Current Outpatient Prescriptions  Medication Sig Dispense Refill  . acetaminophen (TYLENOL) 500 MG tablet Take 1 tablet (500 mg total) by mouth every 4 (four) hours as needed. 100 tablet 0  . albuterol (PROVENTIL HFA;VENTOLIN HFA) 108 (90 BASE) MCG/ACT inhaler Inhale 2 puffs into the lungs every 6 (six) hours as needed for wheezing or shortness of breath.    Marland Kitchen amLODipine (NORVASC) 10 MG tablet Take 1 tablet (10 mg total) by mouth daily. 30 tablet 5  . mometasone-formoterol (DULERA) 200-5 MCG/ACT AERO Inhale 2 puffs into the lungs 2 (two) times daily. 13 g 12  . nicotine (NICODERM CQ - DOSED IN MG/24 HOURS) 14 mg/24hr patch Place 1 patch (14 mg total) onto the skin daily. 14 patch 1  . nystatin (MYCOSTATIN) 100000 UNIT/ML suspension Take 5 mLs (500,000 Units total) by mouth 4 (four) times daily. 60 mL 0  . polyethylene glycol powder (GLYCOLAX/MIRALAX) powder Take 17 g by mouth once. 255 g 0  . sennosides-docusate sodium (SENOKOT-S) 8.6-50 MG tablet Take 2 tablets by mouth daily. 30 tablet 6  . tiotropium (SPIRIVA) 18 MCG  inhalation capsule Place 1 capsule (18 mcg total) into inhaler and inhale daily. 30 capsule 12  . Vitamin D, Cholecalciferol, 1000 UNITS CAPS Take 1 capsule by mouth daily. 90 capsule 3  . [DISCONTINUED] enoxaparin (LOVENOX) 80 MG/0.8ML SOLN injection Inject 0.8 mLs (80 mg total) into the skin every 12 (twelve) hours. 8 mL 0   No current facility-administered medications for this visit.   Family History  Problem Relation Age of Onset  . Cancer Maternal Uncle     colon  . Cancer Maternal Grandmother     liver  . Cancer Mother     liver  . Cancer Brother     Unknown  . Cancer Sister     Unknown   History   Social History  . Marital Status: Divorced    Spouse Name: N/A  . Number of Children: N/A  . Years of Education: N/A   Social History Main Topics  . Smoking status: Former Smoker -- 1.00 packs/day for 20 years    Types: Cigarettes  . Smokeless tobacco: Never Used     Comment: last cig May; on patches.  . Alcohol Use: No  . Drug Use: No  . Sexual Activity: No   Other Topics Concern  . None   Social History Narrative  Patient currently lives at home alone, but does have a home worker who visits her throughout the week to help with things around the house.  Patient states that she still tries to remain active and walks daily.  She is has been divorced for sometime and has one son.  She currently lives off social security.  Patient reports using approximately half a pack of cigarettes daily for 20 years.  She denies any alcohol or illicit drug use.  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     Review of Systems: Review of Systems  Constitutional: Negative for fever and chills.  Respiratory: Negative for cough and shortness of breath.   Cardiovascular: Negative for chest pain.  Gastrointestinal: Negative for heartburn.  Musculoskeletal: Positive for joint pain. Negative for falls.  Neurological: Negative for  sensory change.     Objective:  Physical Exam: Filed Vitals:   11/02/14 0843  BP: 119/67  Pulse: 82  Temp: 98 F (36.7 C)  TempSrc: Oral  Weight: 179 lb 12.8 oz (81.557 kg)  SpO2: 99%   Physical Exam  Constitutional: She is well-developed, well-nourished, and in no distress.  HENT:  Head: Normocephalic and atraumatic.  Cardiovascular: Normal rate and regular rhythm.   Pulmonary/Chest: Effort normal and breath sounds normal. She has no wheezes. She has no rales.  Abdominal: Soft. Bowel sounds are normal.  Musculoskeletal: She exhibits edema (left greater than right).       Right wrist: She exhibits decreased range of motion and tenderness. She exhibits no swelling, no effusion, no crepitus, no deformity and no laceration.  Pain at [facility] as well as wrist, no joint pain in the hand itself.  Nursing note and vitals reviewed.    Assessment & Plan:  Case discussed with Dr. Daryll Drown  Essential hypertension BP Readings from Last 3 Encounters:  11/02/14 119/67  09/24/14 156/72  07/11/14 125/72    Lab Results  Component Value Date   NA 139 07/11/2014   K 4.5 07/11/2014   CREATININE 1.34* 07/11/2014    Assessment: Blood pressure control: controlled Progress toward BP goal:  at goal Comments:   Plan: Medications:  continue current medications Educational resources provided:   Self management tools provided:   Other plans: check BMP   Vitamin D deficiency She reports she is not taking Vit D, I am concerned about osteoporsis after her wrist fracture in 2012, she also has evidence of lumbar compression fractures.  I have instructed her to start taking Vit D 1000 U.  Will check a VIt D level and order DEXA.  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  I suspect she has osteoporosis as well and is at risk of further fractures. - Xrays of wrist - Continue tylenol  Preventative health care - I discussed with her the importance of a dexa scan, she is in  agreement.  TOBACCO ABUSE She wants refill of '21mg'$  patches. I commended her for stopping smoking but instructed we need to wean her from nicotine, will decrease to '14mg'$  patches. If she still needs additional can go down to '7mg'$ .    Medications Ordered Meds ordered this encounter  Medications  . Vitamin D, Cholecalciferol, 1000 UNITS CAPS    Sig: Take 1 capsule by mouth daily.    Dispense:  90 capsule    Refill:  3  . nicotine (NICODERM CQ - DOSED IN MG/24 HOURS) 14 mg/24hr patch    Sig: Place  1 patch (14 mg total) onto the skin daily.    Dispense:  14 patch    Refill:  1   Other Orders Orders Placed This Encounter  Procedures  . For home use only DME Shower stool  . DG Wrist Complete Right    Standing Status: Future     Number of Occurrences:      Standing Expiration Date: 01/02/2016    Order Specific Question:  Reason for Exam (SYMPTOM  OR DIAGNOSIS REQUIRED)    Answer:  right wrist pain, hx of fracture in 2012    Order Specific Question:  Preferred imaging location?    Answer:  [facility]  . DG Bone Density    179 LBS/NO PF/NO NEEDS/INS/MEDICARE/RLC/GLENDA-EPIC    Standing Status: Future     Number of Occurrences:      Standing Expiration Date: 01/02/2016    Order Specific Question:  Reason for Exam (SYMPTOM  OR DIAGNOSIS REQUIRED)    Answer:  screening    Order Specific Question:  Preferred imaging location?    Answer:  [facility]  . Vitamin D (25 hydroxy)  . BMP with Estimated GFR ([id number])   Follow Up: Return in about 3 months (around 02/02/2015).

## 2014-11-02 NOTE — Addendum Note (Signed)
Addended by: Joni Reining C on: 11/02/2014 11:00 AM   Modules accepted: Orders

## 2014-11-02 NOTE — Assessment & Plan Note (Signed)
BP Readings from Last 3 Encounters:  11/02/14 119/67  09/24/14 156/72  07/11/14 125/72    Lab Results  Component Value Date   NA 139 07/11/2014   K 4.5 07/11/2014   CREATININE 1.34* 07/11/2014    Assessment: Blood pressure control: controlled Progress toward BP goal:  at goal Comments:   Plan: Medications:  continue current medications Educational resources provided:   Self management tools provided:   Other plans: check BMP

## 2014-11-02 NOTE — Patient Instructions (Signed)
General Instructions:  Please start taking Vitamin D 1000 units daily and get your DEXA scan.  Please bring your medicines with you each time you come to clinic.  Medicines may include prescription medications, over-the-counter medications, herbal remedies, eye drops, vitamins, or other pills.   Progress Toward Treatment Goals:  Treatment Goal 02/17/2014  Blood pressure at goal  Stop smoking smoking the same amount    Self Care Goals & Plans:  Self Care Goal 11/02/2014  Manage my medications take my medicines as prescribed; bring my medications to every visit; refill my medications on time  Monitor my health keep track of my blood pressure  Eat healthy foods eat more vegetables; eat foods that are low in salt; eat baked foods instead of fried foods  Be physically active find an activity I enjoy  Stop smoking -    No flowsheet data found.   Care Management & Community Referrals:  Referral 11/04/2012  Referrals made for care management support none needed

## 2014-11-02 NOTE — Assessment & Plan Note (Signed)
She wants refill of '21mg'$  patches. I commended her for stopping smoking but instructed we need to wean her from nicotine, will decrease to '14mg'$  patches. If she still needs additional can go down to '7mg'$ .

## 2014-11-02 NOTE — Assessment & Plan Note (Signed)
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  I suspect she has osteoporosis as well and is at risk of further fractures. - Xrays of wrist - Continue tylenol

## 2014-11-02 NOTE — Assessment & Plan Note (Signed)
She reports she is not taking Vit D, I am concerned about osteoporsis after her wrist fracture in 2012, she also has evidence of lumbar compression fractures.  I have instructed her to start taking Vit D 1000 U.  Will check a VIt D level and order DEXA.

## 2014-11-03 LAB — VITAMIN D 25 HYDROXY (VIT D DEFICIENCY, FRACTURES): VIT D 25 HYDROXY: 17 ng/mL — AB (ref 30–100)

## 2014-11-06 NOTE — Addendum Note (Signed)
Addended by: Gilles Chiquito B on: 11/06/2014 11:20 AM   Modules accepted: Level of Service

## 2014-11-06 NOTE — Progress Notes (Signed)
Internal Medicine Clinic Attending  Case discussed with Dr. Hoffman soon after the resident saw the patient.  We reviewed the resident's history and exam and pertinent patient test results.  I agree with the assessment, diagnosis, and plan of care documented in the resident's note. 

## 2014-11-08 ENCOUNTER — Other Ambulatory Visit: Payer: Medicare Other

## 2014-11-16 DIAGNOSIS — L84 Corns and callosities: Secondary | ICD-10-CM | POA: Diagnosis not present

## 2014-11-16 DIAGNOSIS — I739 Peripheral vascular disease, unspecified: Secondary | ICD-10-CM | POA: Diagnosis not present

## 2014-11-16 DIAGNOSIS — L603 Nail dystrophy: Secondary | ICD-10-CM | POA: Diagnosis not present

## 2014-11-28 ENCOUNTER — Ambulatory Visit
Admission: RE | Admit: 2014-11-28 | Discharge: 2014-11-28 | Disposition: A | Discharge status: Home / Self Care | Payer: Medicare Other | Source: Ambulatory Visit | Attending: Internal Medicine | Admitting: Internal Medicine

## 2014-11-28 DIAGNOSIS — Z13820 Encounter for screening for osteoporosis: Secondary | ICD-10-CM

## 2014-11-28 DIAGNOSIS — M85852 Other specified disorders of bone density and structure, left thigh: Secondary | ICD-10-CM | POA: Diagnosis not present

## 2014-11-28 DIAGNOSIS — Z780 Asymptomatic menopausal state: Secondary | ICD-10-CM | POA: Diagnosis not present

## 2014-11-28 DIAGNOSIS — E559 Vitamin D deficiency, unspecified: Secondary | ICD-10-CM

## 2014-11-28 DIAGNOSIS — S32000S Wedge compression fracture of unspecified lumbar vertebra, sequela: Secondary | ICD-10-CM

## 2014-12-19 DIAGNOSIS — I890 Lymphedema, not elsewhere classified: Secondary | ICD-10-CM | POA: Diagnosis not present

## 2014-12-19 DIAGNOSIS — D2372 Other benign neoplasm of skin of left lower limb, including hip: Secondary | ICD-10-CM | POA: Diagnosis not present

## 2014-12-25 ENCOUNTER — Other Ambulatory Visit (HOSPITAL_COMMUNITY): Payer: Self-pay | Admitting: Podiatry

## 2014-12-25 DIAGNOSIS — M7989 Other specified soft tissue disorders: Principal | ICD-10-CM

## 2014-12-25 DIAGNOSIS — M79662 Pain in left lower leg: Secondary | ICD-10-CM

## 2014-12-26 ENCOUNTER — Inpatient Hospital Stay (HOSPITAL_COMMUNITY): Admission: RE | Admit: 2014-12-26 | Payer: Medicare Other | Source: Ambulatory Visit

## 2014-12-29 ENCOUNTER — Ambulatory Visit (HOSPITAL_COMMUNITY)
Admission: RE | Admit: 2014-12-29 | Discharge: 2014-12-29 | Disposition: A | Discharge status: Home / Self Care | Payer: Medicare Other | Source: Ambulatory Visit | Attending: Cardiology | Admitting: Cardiology

## 2014-12-29 DIAGNOSIS — M79605 Pain in left leg: Secondary | ICD-10-CM | POA: Insufficient documentation

## 2014-12-29 DIAGNOSIS — M79662 Pain in left lower leg: Secondary | ICD-10-CM

## 2014-12-29 DIAGNOSIS — M7989 Other specified soft tissue disorders: Secondary | ICD-10-CM | POA: Insufficient documentation

## 2015-01-10 ENCOUNTER — Encounter: Payer: Self-pay | Admitting: *Deleted

## 2015-01-23 ENCOUNTER — Ambulatory Visit: Payer: Medicare Other | Admitting: Internal Medicine

## 2015-01-24 ENCOUNTER — Ambulatory Visit (INDEPENDENT_AMBULATORY_CARE_PROVIDER_SITE_OTHER): Payer: Medicare Other | Admitting: Internal Medicine

## 2015-01-24 ENCOUNTER — Encounter: Payer: Self-pay | Admitting: Internal Medicine

## 2015-01-24 VITALS — BP 112/63 | HR 83 | Temp 97.6°F | Ht 60.0 in | Wt 176.9 lb

## 2015-01-24 DIAGNOSIS — R600 Localized edema: Secondary | ICD-10-CM | POA: Diagnosis not present

## 2015-01-24 DIAGNOSIS — M25512 Pain in left shoulder: Secondary | ICD-10-CM | POA: Diagnosis not present

## 2015-01-24 MED ORDER — AMLODIPINE BESYLATE 5 MG PO TABS: 10.0000 mg | ORAL_TABLET | Freq: Every day | ORAL | Status: DC

## 2015-01-24 MED ORDER — DICLOFENAC SODIUM 1 % TD GEL: 2.0000 g | Freq: Four times a day (QID) | TRANSDERMAL | Status: DC

## 2015-01-24 NOTE — Progress Notes (Signed)
Internal Medicine Clinic Attending 74 year old female with ankle swelling for the past year, on amlodipine 10. Also complain of L shoulder pain (AC degenerative changes, Xray 2013).   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 Despite chronicity of symptoms DVT could be a differential however the patient had a negative USG LLE in August 2016. Given that, I would decrease Amlodipine to 5 mg. BP is too well controlled, given her age we can relax the limits, if in 2 weeks unstatisfactory control - can add lisinopril 10.   Shoulder pain - Conservative management, tylenol - voltaren gel - heat packs. Discuss referral to sports medicine for intraarticular injections if the patient prefers.  Case discussed with Dr. Hulen Luster at the time of the visit.  We reviewed the resident's history and exam and pertinent patient test results.  I agree with the assessment, diagnosis, and plan of care documented in the resident's note. [name] MD MPH 01/24/2015 9:27 AM

## 2015-01-24 NOTE — Patient Instructions (Signed)
Take amlodipine '5mg'$  daily for blood pressure. You can take half a tab of your '10mg'$  tab that you just refilled.   Apply voltaren gel to your left shoulder for pain.   Wear ted hose on your left leg to help with swelling and try to keep left leg elevated as well.

## 2015-01-25 ENCOUNTER — Ambulatory Visit: Payer: Medicare Other | Admitting: Internal Medicine

## 2015-01-25 DIAGNOSIS — R600 Localized edema: Secondary | ICD-10-CM | POA: Insufficient documentation

## 2015-01-25 DIAGNOSIS — M25512 Pain in left shoulder: Secondary | ICD-10-CM | POA: Insufficient documentation

## 2015-01-25 NOTE — Progress Notes (Signed)
   Subjective:    Patient ID: [name], female    DOB: [birth year], 74 y.o.   MRN: [id number]  HPI 74 y/o F w/ PMHx of COPD, deafness, CKD stage 3, and vit D def who presents to clinic for left shoulder pain and left lower extremity edema. Please see problem list for further details.      Review of Systems  Constitutional: Negative for fever.  Respiratory: Negative for shortness of breath.   Cardiovascular: Positive for leg swelling (chronic x 1 year, goes away w/ elevation). Negative for chest pain.  Musculoskeletal: Positive for arthralgias (left shoulder pain).       Objective:   Physical Exam  Constitutional: She appears well-developed and well-nourished. No distress.  HENT:  Head: Normocephalic.  Cardiovascular: Normal rate and regular rhythm.   No murmur heard. Pulmonary/Chest: Effort normal and breath sounds normal. No respiratory distress. She has no wheezes.  Abdominal: Soft. Bowel sounds are normal.  Musculoskeletal:  No pain on external and internal rotation of left shoulder, ttp of left deltoid muscle, shoulder pain on arm flexion against resistance, left ankle and foot with 1+ pedal edema, rt leg 1+ pedal edma, left calf size equal to left and non erythematous or warm. Tender when rotating left ankle          Assessment & Plan:  Please see problem based assessment and plan.

## 2015-01-25 NOTE — Assessment & Plan Note (Addendum)
Pt complaining of left shoulder pain x 3 years. She is ttp of left deltoid muscle and has pain when resisting arm flexion on exam. Shoulder xray on 08/2012 revealed osseous demineralization with AC joint degenerative changes. Likely degenerative changes causing shoulder pain. She has tried shoulder injections in the past which have not worked for her. She takes tylenol for pain which helps alleviate pain but does not like having to take tylenol so much for it. Will rx voltaren gel to apply to area of shoulder pain.

## 2015-01-25 NOTE — Assessment & Plan Note (Signed)
Pt complaining of swelling in LLE x 1 year. Edema goes away with elevation and occurs after walking. Unable to palpate pulses on LLE but both feet are warm w/o signs of ischemia. Calves are equal in size b/l, her left ankle is larger that her rt. She had LLE dopplers on 8/19 that was neg for DVT. She is on norvasc '10mg'$  which could cause LE edema. However most likely venous statis. She does not want to change her BP medication b/c she just got it filled and has a whole bottle left. Will decrease dose to norvasc '5mg'$  and see if it helps w/ swelling. If not can add low dose lisinopril on her next visit in 2 weeks. Also placed order for ted hose to help with swelling.

## 2015-02-01 DIAGNOSIS — I739 Peripheral vascular disease, unspecified: Secondary | ICD-10-CM | POA: Diagnosis not present

## 2015-02-01 DIAGNOSIS — L84 Corns and callosities: Secondary | ICD-10-CM | POA: Diagnosis not present

## 2015-02-01 DIAGNOSIS — L603 Nail dystrophy: Secondary | ICD-10-CM | POA: Diagnosis not present

## 2015-02-07 ENCOUNTER — Encounter: Payer: Self-pay | Admitting: Internal Medicine

## 2015-02-07 ENCOUNTER — Ambulatory Visit (INDEPENDENT_AMBULATORY_CARE_PROVIDER_SITE_OTHER): Payer: Medicare Other | Admitting: Internal Medicine

## 2015-02-07 VITALS — BP 129/103 | HR 78 | Temp 98.2°F | Wt 178.0 lb

## 2015-02-07 DIAGNOSIS — I10 Essential (primary) hypertension: Secondary | ICD-10-CM

## 2015-02-07 DIAGNOSIS — R600 Localized edema: Secondary | ICD-10-CM

## 2015-02-07 MED ORDER — AMLODIPINE BESYLATE 10 MG PO TABS: 10.0000 mg | ORAL_TABLET | Freq: Every day | ORAL | Status: DC

## 2015-02-07 NOTE — Assessment & Plan Note (Signed)
Patient has persistent left lower extremity edema which is bothersome to her. DVT was previously ruled out with doppler study. Differential included venous insufficiency or amlodipine side effect. At last visit, patient's amlodipine 10 mg was decreased to 5 mg daily to see if edema improved. It did not. She was also given TED hose, but states the wouldn't stay up on her leg so she didn't wear them. I feel that her edema is more consistent with venous insufficiency. We will re-measure her for TED hose and get her a smaller size. Edema could be secondary to amlodipine; however, edema did not improve with lower dose and there are not suitable alternatives for her blood pressure other than amlodipine. ACEI caused hyperkalemia and questionable angioedema in the past. HCTZ and lasix would be cautioned with her GFR. Other treatment modalities such as BB, hydralazine, and clonidine would not be as effective for her BP and poor alternatives given issues with compliance and multiple doses during the day.   Plan: -Continue amlodipine 10 mg daily -Re-measured lower extremities for TED hose--> actually a large, not extra-large -Provided information on venous insufficiency, encouraged weight loss and keeping her legs up

## 2015-02-07 NOTE — Patient Instructions (Signed)
KEEP YOU LEGS ELEVATED WHEN YOU CAN. WEAR YOUR TED HOSE TO DECREASE SWELLING.  CONTINUE TAKING AMLODIPINE 10 MG EVERYDAY.   Venous Stasis or Chronic Venous Insufficiency Chronic venous insufficiency, also called venous stasis, is a condition that affects the veins in the legs. The condition prevents blood from being pumped through these veins effectively. Blood may no longer be pumped effectively from the legs back to the heart. This condition can range from mild to severe. With proper treatment, you should be able to continue with an active life. CAUSES  Chronic venous insufficiency occurs when the vein walls become stretched, weakened, or damaged or when valves within the vein are damaged. Some common causes of this include:  High blood pressure inside the veins (venous hypertension).  Increased blood pressure in the leg veins from long periods of sitting or standing.  A blood clot that blocks blood flow in a vein (deep vein thrombosis).  Inflammation of a superficial vein (phlebitis) that causes a blood clot to form. RISK FACTORS Various things can make you more likely to develop chronic venous insufficiency, including:  Family history of this condition.  Obesity.  Pregnancy.  Sedentary lifestyle.  Smoking.  Jobs requiring long periods of standing or sitting in one place.  Being a certain age. Women in their 18s and 38s and men in their 38s are more likely to develop this condition. SIGNS AND SYMPTOMS  Symptoms may include:   Varicose veins.  Skin breakdown or ulcers.  Reddened or discolored skin on the leg.  Brown, smooth, tight, and painful skin just above the ankle, usually on the inside surface (lipodermatosclerosis).  Swelling. DIAGNOSIS  To diagnose this condition, your health care provider will take a medical history and do a physical exam. The following tests may be ordered to confirm the diagnosis:  Duplex ultrasound--A procedure that produces a picture of a  blood vessel and nearby organs and also provides information on blood flow through the blood vessel.  Plethysmography--A procedure that tests blood flow.  A venogram, or venography--A procedure used to look at the veins using X-ray and dye. TREATMENT The goals of treatment are to help you return to an active life and to minimize pain or disability. Treatment will depend on the severity of the condition. Medical procedures may be needed for severe cases. Treatment options may include:   Use of compression stockings. These can help with symptoms and lower the chances of the problem getting worse, but they do not cure the problem.  Sclerotherapy--A procedure involving an injection of a material that "dissolves" the damaged veins. Other veins in the network of blood vessels take over the function of the damaged veins.  Surgery to remove the vein or cut off blood flow through the vein (vein stripping or laser ablation surgery).  Surgery to repair a valve. HOME CARE INSTRUCTIONS   Wear compression stockings as directed by your health care provider.  Only take over-the-counter or prescription medicines for pain, discomfort, or fever as directed by your health care provider.  Follow up with your health care provider as directed. SEEK MEDICAL CARE IF:   You have redness, swelling, or increasing pain in the affected area.  You see a red streak or line that extends up or down from the affected area.  You have a breakdown or loss of skin in the affected area, even if the breakdown is small.  You have an injury to the affected area. SEEK IMMEDIATE MEDICAL CARE IF:   You have  an injury and open wound in the affected area.  Your pain is severe and does not improve with medicine.  You have sudden numbness or weakness in the foot or ankle below the affected area, or you have trouble moving your foot or ankle.  You have a fever or persistent symptoms for more than 2-3 days.  You have a fever  and your symptoms suddenly get worse. MAKE SURE YOU:   Understand these instructions.  Will watch your condition.  Will get help right away if you are not doing well or get worse. Document Released: 09/01/2006 Document Revised: 02/16/2013 Document Reviewed: 01/03/2013 [facility] Patient Information 2015 [place], [place]. This information is not intended to replace advice given to you by your health care provider. Make sure you discuss any questions you have with your health care provider.

## 2015-02-07 NOTE — Assessment & Plan Note (Signed)
BP Readings from Last 3 Encounters:  02/07/15 129/103  01/24/15 112/63  11/02/14 119/67    Lab Results  Component Value Date   NA 139 11/02/2014   K 4.2 11/02/2014   CREATININE 1.33* 11/02/2014    Assessment: Blood pressure control:  Uncontrolled Progress toward BP goal:   Deteriorated Comments: Amlodipine was recently decreased to 5 mg daily d/t lower extremity edema. Please see edema for more niforamtion.   Plan: Medications:  continue current medications, Resume amlodipine 10 mg daily.  Cannot use ACEI due to hyperkalemia in the past and questionable angioedema. Cannot use HCTZ given renal function. No clear indications for BB and verapamil can also cause lower extremity edema like amlodipine.

## 2015-02-07 NOTE — Progress Notes (Signed)
Subjective:    Patient ID: [name], female    DOB: [birth year], 74 y.o.   MRN: [id number]  HPI [name] is a 74 y.o. female with PMHx of HTN, DVT, and CKD who presents to the clinic for left lower extremity swelling. Please see A&P for the status of the patient's chronic medical problems.   Past Medical History  Diagnosis Date  . Hypertension   . HEARING LOSS   . 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 Arthritis   . DVT (deep venous thrombosis)   . Phlebitis   . CKD (chronic kidney disease) stage 3, GFR 30-59 ml/min     Outpatient Encounter Prescriptions as of 02/07/2015  Medication Sig Note  . acetaminophen (TYLENOL) 500 MG tablet Take 1 tablet (500 mg total) by mouth every 4 (four) hours as needed.   Marland Kitchen albuterol (PROVENTIL HFA;VENTOLIN HFA) 108 (90 BASE) MCG/ACT inhaler Inhale 2 puffs into the lungs every 6 (six) hours as needed for wheezing or shortness of breath.   Marland Kitchen amLODipine (NORVASC) 5 MG tablet Take 2 tablets (10 mg total) by mouth daily.   . diclofenac sodium (VOLTAREN) 1 % GEL Apply 2 g topically 4 (four) times daily.   . mometasone-formoterol (DULERA) 200-5 MCG/ACT AERO Inhale 2 puffs into the lungs 2 (two) times daily.   . nicotine (NICODERM CQ - DOSED IN MG/24 HOURS) 14 mg/24hr patch Place 1 patch (14 mg total) onto the skin daily.   Marland Kitchen nystatin (MYCOSTATIN) 100000 UNIT/ML suspension Take 5 mLs (500,000 Units total) by mouth 4 (four) times daily. 02/20/2014: Pt has not picked up from the pharmacy yet.  . polyethylene glycol powder (GLYCOLAX/MIRALAX) powder Take 17 g by mouth once.   . sennosides-docusate sodium (SENOKOT-S) 8.6-50 MG tablet Take 2 tablets by mouth daily.   Marland Kitchen tiotropium (SPIRIVA) 18 MCG inhalation capsule Place 1 capsule (18 mcg total) into inhaler and inhale daily.   . Vitamin D, Cholecalciferol, 1000 UNITS CAPS Take 1 capsule by mouth daily.    No facility-administered encounter medications on file as of 02/07/2015.     Family History  Problem Relation Age of Onset  . Cancer Maternal Uncle     colon  . Cancer Maternal Grandmother     liver  . Cancer Mother     liver  . Cancer Brother     Unknown  . Cancer Sister     Unknown    Social History   Social History  . Marital Status: Divorced    Spouse Name: N/A  . Number of Children: N/A  . Years of Education: N/A   Occupational History  . Not on file.   Social History Main Topics  . Smoking status: Former Smoker -- 1.00 packs/day for 20 years    Types: Cigarettes  . Smokeless tobacco: Never Used     Comment: last cig May; on patches.  . Alcohol Use: No  . Drug Use: No  . Sexual Activity: No   Other Topics Concern  . Not on file   Social History Narrative   Patient currently lives at home alone, but does have a home worker who visits her throughout the week to help with things around the house.  Patient states that she still tries to remain active and walks daily.  She is has been divorced for sometime and has one son.  She currently lives off social security.  Patient reports using approximately half a  pack of cigarettes daily for 20 years.  She denies any alcohol or illicit drug use.  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      Review of Systems General: Denies fever, chills, fatigue.  Respiratory: Denies SOB, cough, DOE.   Cardiovascular: Denies chest pain and palpitations.  Gastrointestinal: Denies abdominal pain, diarrhea, constipation Musculoskeletal: Admits to lower extremity edema L>R. Denies myalgias, back pain, joint swelling and gait problem.  Skin: Denies pallor, rash and wounds.  Neurological: Admits to headache. Denies dizziness, weakness, lightheadedness     Objective:   Physical Exam Filed Vitals:   02/07/15 0854  BP: 129/103  Pulse: 78  Temp: 98.2 F (36.8 C)  TempSrc: Oral  Weight: 178 lb (80.74 kg)  SpO2: 97%   General: Vital signs  reviewed.  Patient is well-developed and well-nourished, in no acute distress and cooperative with exam.  Cardiovascular: RRR, S1 normal, S2 normal, no murmurs, gallops, or rubs. Pulmonary/Chest: Clear to auscultation bilaterally, no wheezes, rales, or rhonchi. Abdominal: Soft, non-tender, non-distended, BS + Extremities: 2+ non-pitting edema of left lower extremity. 1+ non-pitting edema of right lower extremity. No associated erythema or increased warmth. Mildly tender diffusely of both lower extremities, but no localized pain over calves or of deep venous system.  Skin: Warm, dry and intact. No rashes or erythema. Psychiatric: Normal mood and affect. speech and behavior is normal. Cognition and memory are normal.      Assessment & Plan:   Please see problem based assessment and plan.

## 2015-02-08 NOTE — Progress Notes (Signed)
Internal Medicine Clinic Attending  Case discussed with Dr. Richardson soon after the resident saw the patient.  We reviewed the resident's history and exam and pertinent patient test results.  I agree with the assessment, diagnosis, and plan of care documented in the resident's note. 

## 2015-02-09 ENCOUNTER — Emergency Department (HOSPITAL_COMMUNITY)
Admission: EM | Admit: 2015-02-09 | Discharge: 2015-02-09 | Disposition: A | Discharge status: Home / Self Care | Payer: Medicare Other | Attending: Emergency Medicine | Admitting: Emergency Medicine

## 2015-02-09 ENCOUNTER — Other Ambulatory Visit: Payer: Self-pay

## 2015-02-09 ENCOUNTER — Emergency Department (HOSPITAL_COMMUNITY): Payer: Medicare Other

## 2015-02-09 ENCOUNTER — Encounter (HOSPITAL_COMMUNITY): Payer: Self-pay | Admitting: Emergency Medicine

## 2015-02-09 DIAGNOSIS — N183 Chronic kidney disease, stage 3 (moderate): Secondary | ICD-10-CM | POA: Insufficient documentation

## 2015-02-09 DIAGNOSIS — H9190 Unspecified hearing loss, unspecified ear: Secondary | ICD-10-CM | POA: Insufficient documentation

## 2015-02-09 DIAGNOSIS — R519 Headache, unspecified: Secondary | ICD-10-CM

## 2015-02-09 DIAGNOSIS — Z7951 Long term (current) use of inhaled steroids: Secondary | ICD-10-CM | POA: Insufficient documentation

## 2015-02-09 DIAGNOSIS — M1990 Unspecified osteoarthritis, unspecified site: Secondary | ICD-10-CM | POA: Diagnosis not present

## 2015-02-09 DIAGNOSIS — M7989 Other specified soft tissue disorders: Secondary | ICD-10-CM

## 2015-02-09 DIAGNOSIS — I129 Hypertensive chronic kidney disease with stage 1 through stage 4 chronic kidney disease, or unspecified chronic kidney disease: Secondary | ICD-10-CM | POA: Insufficient documentation

## 2015-02-09 DIAGNOSIS — R51 Headache: Secondary | ICD-10-CM | POA: Insufficient documentation

## 2015-02-09 DIAGNOSIS — Z86718 Personal history of other venous thrombosis and embolism: Secondary | ICD-10-CM | POA: Diagnosis not present

## 2015-02-09 DIAGNOSIS — I5030 Unspecified diastolic (congestive) heart failure: Secondary | ICD-10-CM | POA: Diagnosis not present

## 2015-02-09 DIAGNOSIS — R2243 Localized swelling, mass and lump, lower limb, bilateral: Secondary | ICD-10-CM | POA: Insufficient documentation

## 2015-02-09 DIAGNOSIS — Z79899 Other long term (current) drug therapy: Secondary | ICD-10-CM | POA: Insufficient documentation

## 2015-02-09 DIAGNOSIS — H540 Blindness, both eyes: Secondary | ICD-10-CM | POA: Insufficient documentation

## 2015-02-09 DIAGNOSIS — R030 Elevated blood-pressure reading, without diagnosis of hypertension: Secondary | ICD-10-CM | POA: Diagnosis not present

## 2015-02-09 DIAGNOSIS — Z720 Tobacco use: Secondary | ICD-10-CM | POA: Diagnosis not present

## 2015-02-09 DIAGNOSIS — R05 Cough: Secondary | ICD-10-CM | POA: Diagnosis not present

## 2015-02-09 LAB — COMPREHENSIVE METABOLIC PANEL
ALK PHOS: 95 U/L (ref 38–126)
ALT: 12 U/L — ABNORMAL LOW (ref 14–54)
AST: 19 U/L (ref 15–41)
Albumin: 4 g/dL (ref 3.5–5.0)
Anion gap: 7 (ref 5–15)
BILIRUBIN TOTAL: 0.5 mg/dL (ref 0.3–1.2)
BUN: 15 mg/dL (ref 6–20)
CALCIUM: 9.5 mg/dL (ref 8.9–10.3)
CO2: 28 mmol/L (ref 22–32)
Chloride: 101 mmol/L (ref 101–111)
Creatinine, Ser: 1.03 mg/dL — ABNORMAL HIGH (ref 0.44–1.00)
GFR calc non Af Amer: 52 mL/min — ABNORMAL LOW (ref >60)
Glucose, Bld: 102 mg/dL — ABNORMAL HIGH (ref 65–99)
Potassium: 3.8 mmol/L (ref 3.5–5.1)
Sodium: 136 mmol/L (ref 135–145)
Total Protein: 7.4 g/dL (ref 6.5–8.1)

## 2015-02-09 LAB — CBC WITH DIFFERENTIAL/PLATELET
Basophils Absolute: 0.1 10*3/uL (ref 0.0–0.1)
Basophils Relative: 1 %
Eosinophils Absolute: 0.2 10*3/uL (ref 0.0–0.7)
Eosinophils Relative: 3 %
HCT: 45.1 % (ref 36.0–46.0)
HEMOGLOBIN: 15 g/dL (ref 12.0–15.0)
LYMPHS ABS: 1.7 10*3/uL (ref 0.7–4.0)
Lymphocytes Relative: 29 %
MCH: 32 pg (ref 26.0–34.0)
MCHC: 33.3 g/dL (ref 30.0–36.0)
MCV: 96.2 fL (ref 78.0–100.0)
MONOS PCT: 10 %
Monocytes Absolute: 0.6 10*3/uL (ref 0.1–1.0)
NEUTROS PCT: 57 %
Neutro Abs: 3.3 10*3/uL (ref 1.7–7.7)
Platelets: 191 10*3/uL (ref 150–400)
RBC: 4.69 MIL/uL (ref 3.87–5.11)
RDW: 14.3 % (ref 11.5–15.5)
WBC: 5.9 10*3/uL (ref 4.0–10.5)

## 2015-02-09 LAB — I-STAT TROPONIN, ED: Troponin i, poc: 0 ng/mL (ref 0.00–0.08)

## 2015-02-09 LAB — BRAIN NATRIURETIC PEPTIDE: B NATRIURETIC PEPTIDE 5: 10.6 pg/mL (ref 0.0–100.0)

## 2015-02-09 MED ORDER — FUROSEMIDE 20 MG PO TABS: 20.0000 mg | ORAL_TABLET | Freq: Every day | ORAL | Status: DC

## 2015-02-09 MED ORDER — DIPHENHYDRAMINE HCL 50 MG/ML IJ SOLN
12.5000 mg | Freq: Once | INTRAMUSCULAR | Status: AC
  Administered 2015-02-09: 12.5 mg via INTRAVENOUS
  Filled 2015-02-09: qty 1

## 2015-02-09 MED ORDER — IPRATROPIUM-ALBUTEROL 0.5-2.5 (3) MG/3ML IN SOLN
3.0000 mL | Freq: Once | RESPIRATORY_TRACT | Status: AC
  Administered 2015-02-09: 3 mL via RESPIRATORY_TRACT
  Filled 2015-02-09: qty 3

## 2015-02-09 MED ORDER — METOCLOPRAMIDE HCL 5 MG/ML IJ SOLN
10.0000 mg | Freq: Once | INTRAMUSCULAR | Status: AC
  Administered 2015-02-09: 10 mg via INTRAVENOUS
  Filled 2015-02-09: qty 2

## 2015-02-09 MED ORDER — FUROSEMIDE 10 MG/ML IJ SOLN
40.0000 mg | Freq: Once | INTRAMUSCULAR | Status: AC
Start: 2015-02-09 — End: 2015-02-09
  Administered 2015-02-09: 40 mg via INTRAVENOUS
  Filled 2015-02-09: qty 4

## 2015-02-09 MED ORDER — TRAMADOL HCL 50 MG PO TABS
50.0000 mg | ORAL_TABLET | Freq: Once | ORAL | Status: AC
  Administered 2015-02-09: 50 mg via ORAL
  Filled 2015-02-09: qty 1

## 2015-02-09 MED ORDER — TRAMADOL HCL 50 MG PO TABS: 50.0000 mg | ORAL_TABLET | Freq: Four times a day (QID) | ORAL | Status: DC | PRN

## 2015-02-09 NOTE — ED Notes (Signed)
Pt walked to and from restroom state her left foot hurt really bad. O2=95  pulse=91

## 2015-02-09 NOTE — ED Notes (Signed)
Pt presents from home via EMS for left leg pain/swelling and headache x6 months.   Last VS: 152/70, 72hr, 16 resp, 96%ra.

## 2015-02-09 NOTE — ED Notes (Signed)
Bed: KG25 Expected date:  Expected time:  Means of arrival:  Comments: EMS- 73 LEG PAIN HEADACHE

## 2015-02-09 NOTE — ED Notes (Signed)
Called [facility] for pickup.

## 2015-02-09 NOTE — ED Provider Notes (Signed)
CSN: [id number]     Arrival date & time 02/09/15  1854 History   First MD Initiated Contact with Patient 02/09/15 1859     Chief Complaint  Patient presents with  . Leg Pain  . Headache     (Consider location/radiation/quality/duration/timing/severity/associated sxs/prior Treatment) The history is provided by the patient.  GARRY BOCHICCHIO is a 74 y.o. female hx of HTN, DVT off coumadin, CKD here with leg pain, leg swelling, headaches. Patient states that she has been having leg pain for the last 6 months. She had a negative DVT study month ago. Also has intermittent headaches. About 2 weeks ago, patient was told to decrease her Norvasc from 10 mg back to 5 mg. About 2 days ago, she went back to the clinic and told them that the leg swelling has not improved. The primary care doctor then increase the Norvasc back to 10 mg. Since then, her headache has been progressively getting worse. Denies a nausea vomiting. Denies any abdominal pain. Denies any shortness of breath or chest pain. Has been taking Tylenol with no relief.   Past Medical History  Diagnosis Date  . Hypertension   . HEARING LOSS   . 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 Arthritis   . DVT (deep venous thrombosis)   . Phlebitis   . CKD (chronic kidney disease) stage 3, GFR 30-59 ml/min    Past Surgical History  Procedure Laterality Date  . Eye surgery    . Shoulder open rotator cuff repair  1973    right shoulder, secondary numbness in R hand  . Foot surgery    . Right wrist     Family History  Problem Relation Age of Onset  . Cancer Maternal Uncle     colon  . Cancer Maternal Grandmother     liver  . Cancer Mother     liver  . Cancer Brother     Unknown  . Cancer Sister     Unknown   Social History  Substance Use Topics  . Smoking status: Current Some Day Smoker -- 1.00 packs/day for 20 years    Types: Cigarettes  . Smokeless tobacco: Never Used     Comment: last cig May; on  patches.  . Alcohol Use: No   OB History    No data available     Review of Systems  Cardiovascular: Positive for leg swelling.  Neurological: Positive for headaches.  All other systems reviewed and are negative.     Allergies  Codeine and Morphine  Home Medications   Prior to Admission medications   Medication Sig Start Date End Date Taking? Authorizing Provider  acetaminophen (TYLENOL) 500 MG tablet Take 1 tablet (500 mg total) by mouth every 4 (four) hours as needed. Patient taking differently: Take 500 mg by mouth every 4 (four) hours as needed for moderate pain or headache.  03/01/14 03/01/15 Yes Jones Bales, MD  amLODipine (NORVASC) 10 MG tablet Take 1 tablet (10 mg total) by mouth daily. 02/07/15  Yes Alexa Sherral Hammers, MD  nicotine (NICODERM CQ - DOSED IN MG/24 HOURS) 21 mg/24hr patch Place 21 mg onto the skin daily.   Yes Historical Provider, MD  diclofenac sodium (VOLTAREN) 1 % GEL Apply 2 g topically 4 (four) times daily. Patient not taking: Reported on 02/09/2015 01/24/15   Norman Herrlich, MD  mometasone-formoterol [facility]) 200-5 MCG/ACT AERO Inhale 2 puffs into the lungs 2 (two) times  daily. Patient not taking: Reported on 02/09/2015 02/21/14   Blain Pais, MD  nicotine (NICODERM CQ - DOSED IN MG/24 HOURS) 14 mg/24hr patch Place 1 patch (14 mg total) onto the skin daily. Patient not taking: Reported on 02/09/2015 11/02/14   Lucious Groves, DO  nystatin (MYCOSTATIN) 100000 UNIT/ML suspension Take 5 mLs (500,000 Units total) by mouth 4 (four) times daily. Patient not taking: Reported on 02/09/2015 02/17/14   Wilber Oliphant, MD  polyethylene glycol powder (GLYCOLAX/MIRALAX) powder Take 17 g by mouth once. Patient not taking: Reported on 02/09/2015 07/11/14   [name], MD  sennosides-docusate sodium (SENOKOT-S) 8.6-50 MG tablet Take 2 tablets by mouth daily. Patient not taking: Reported on 02/09/2015 07/11/14   [name], MD  tiotropium (SPIRIVA) 18 MCG  inhalation capsule Place 1 capsule (18 mcg total) into inhaler and inhale daily. Patient not taking: Reported on 02/09/2015 02/21/14   Blain Pais, MD  Vitamin D, Cholecalciferol, 1000 UNITS CAPS Take 1 capsule by mouth daily. Patient not taking: Reported on 02/09/2015 11/02/14   Lucious Groves, DO   BP 132/60 mmHg  Pulse 72  Temp(Src) 98 F (36.7 C) (Oral)  Resp 12  SpO2 92% Physical Exam  Constitutional: She is oriented to person, place, and time.  Chronically ill, NAD   HENT:  Head: Normocephalic.  Mouth/Throat: Oropharynx is clear and moist.  Eyes: Conjunctivae are normal. Pupils are equal, round, and reactive to light.  Neck: Normal range of motion. Neck supple.  Cardiovascular: Normal rate, regular rhythm and normal heart sounds.   Pulmonary/Chest: Effort normal.  Mild diffuse wheezing, no crackles   Abdominal: Soft. Bowel sounds are normal. She exhibits no distension. There is no tenderness. There is no rebound.  Musculoskeletal:  2+ edema L leg, 1+ edema R leg. No calf tenderness. 2+ pulses   Neurological: She is alert and oriented to person, place, and time.  Skin: Skin is warm and dry.  Psychiatric: She has a normal mood and affect. Her behavior is normal. Judgment and thought content normal.  Nursing note and vitals reviewed.   ED Course  Procedures (including critical care time) Labs Review Labs Reviewed  COMPREHENSIVE METABOLIC PANEL - Abnormal; Notable for the following:    Glucose, Bld 102 (*)    Creatinine, Ser 1.03 (*)    ALT 12 (*)    GFR calc non Af Amer 52 (*)    All other components within normal limits  CBC WITH DIFFERENTIAL/PLATELET  BRAIN NATRIURETIC PEPTIDE  I-STAT TROPOININ, ED    Imaging Review Dg Chest 2 View  02/09/2015   CLINICAL DATA:  Cough  EXAM: CHEST  2 VIEW  COMPARISON:  02/20/2014  FINDINGS: Cardiac enlargement. Pulmonary vascular congestion. Increased interstitial lung markings most consistent with congestive heart failure and  edema. No significant pleural effusion. Mild atelectasis in the lung bases.  IMPRESSION: Congestive heart failure with interstitial edema.   Electronically Signed   By: Franchot Gallo M.D.   On: 02/09/2015 19:58   I have personally reviewed and evaluated these images and lab results as part of my medical decision-making.   EKG Interpretation   Date/Time:  Friday February 09 2015 20:23:16 EDT Ventricular Rate:  68 PR Interval:  156 QRS Duration: 77 QT Interval:  411 QTC Calculation: 437 R Axis:   57 Text Interpretation:  Sinus rhythm No significant change since last  tracing Confirmed by YAO  MD, DAVID ([id number]) on 02/09/2015 8:27:24 PM      MDM  Final diagnoses:  None   LADASHIA DEMARINIS is a 73 y.o. female here with leg swelling, headaches. I think leg swelling likely chronic venous stasis and may get worse with norvasc. In the recent clinic note, there was a consideration to switch BP meds to HCTZ or lasix but didn't do it because of renal insufficiency. Will recheck kidney function. Headaches likely chronic, I doubt SAH. She has some wheezing as well so will get CXR and give neb.     9:50 PM CXR showed mild interstitial edema. I considered CHF and EKG unchanged and BNP nl. Had previous echo showed nl EF. She may have diastolic dysfunction causing mild pul edema and leg swelling. Patient ambulated with pulse ox 95%. No need for admission. She is given lasix 40 mg IV. Will dc home with lasix 20 mg daily x 2 days. Headache improved. Will also give tramadol for leg pain. Will have her f/u with Internal teaching service next week.     Wandra Arthurs, MD 02/09/15 2153

## 2015-02-09 NOTE — Discharge Instructions (Signed)
Take lasix 20 mg daily for 2 days.   Take tramadol as needed for pain.   Keep leg elevated, use compression stockings.   See internal medicine doctor early next week for follow up.   Return to ER if you have severe headache, worse leg swelling, shortness of breath.

## 2015-02-09 NOTE — ED Notes (Signed)
MD at bedside. 

## 2015-02-15 DIAGNOSIS — Z961 Presence of intraocular lens: Secondary | ICD-10-CM | POA: Diagnosis not present

## 2015-02-15 DIAGNOSIS — H5500 Unspecified nystagmus: Secondary | ICD-10-CM | POA: Diagnosis not present

## 2015-02-15 DIAGNOSIS — Z947 Corneal transplant status: Secondary | ICD-10-CM | POA: Diagnosis not present

## 2015-02-15 DIAGNOSIS — H548 Legal blindness, as defined in USA: Secondary | ICD-10-CM | POA: Diagnosis not present

## 2015-03-06 ENCOUNTER — Encounter (HOSPITAL_COMMUNITY): Payer: Self-pay | Admitting: Emergency Medicine

## 2015-03-06 ENCOUNTER — Emergency Department (HOSPITAL_COMMUNITY): Payer: Medicare Other

## 2015-03-06 ENCOUNTER — Emergency Department (HOSPITAL_COMMUNITY)
Admission: EM | Admit: 2015-03-06 | Discharge: 2015-03-06 | Disposition: A | Discharge status: Home / Self Care | Payer: Medicare Other | Attending: Emergency Medicine | Admitting: Emergency Medicine

## 2015-03-06 DIAGNOSIS — N390 Urinary tract infection, site not specified: Secondary | ICD-10-CM | POA: Diagnosis not present

## 2015-03-06 DIAGNOSIS — Z79899 Other long term (current) drug therapy: Secondary | ICD-10-CM | POA: Insufficient documentation

## 2015-03-06 DIAGNOSIS — Z720 Tobacco use: Secondary | ICD-10-CM | POA: Diagnosis not present

## 2015-03-06 DIAGNOSIS — R109 Unspecified abdominal pain: Secondary | ICD-10-CM | POA: Diagnosis not present

## 2015-03-06 DIAGNOSIS — M1990 Unspecified osteoarthritis, unspecified site: Secondary | ICD-10-CM | POA: Diagnosis not present

## 2015-03-06 DIAGNOSIS — N183 Chronic kidney disease, stage 3 (moderate): Secondary | ICD-10-CM | POA: Diagnosis not present

## 2015-03-06 DIAGNOSIS — Z86718 Personal history of other venous thrombosis and embolism: Secondary | ICD-10-CM | POA: Insufficient documentation

## 2015-03-06 DIAGNOSIS — R1084 Generalized abdominal pain: Secondary | ICD-10-CM

## 2015-03-06 DIAGNOSIS — K8020 Calculus of gallbladder without cholecystitis without obstruction: Secondary | ICD-10-CM | POA: Diagnosis not present

## 2015-03-06 DIAGNOSIS — I129 Hypertensive chronic kidney disease with stage 1 through stage 4 chronic kidney disease, or unspecified chronic kidney disease: Secondary | ICD-10-CM | POA: Diagnosis not present

## 2015-03-06 DIAGNOSIS — K5900 Constipation, unspecified: Secondary | ICD-10-CM | POA: Diagnosis not present

## 2015-03-06 DIAGNOSIS — Z947 Corneal transplant status: Secondary | ICD-10-CM | POA: Insufficient documentation

## 2015-03-06 DIAGNOSIS — H9190 Unspecified hearing loss, unspecified ear: Secondary | ICD-10-CM | POA: Diagnosis not present

## 2015-03-06 DIAGNOSIS — R10814 Left lower quadrant abdominal tenderness: Secondary | ICD-10-CM | POA: Diagnosis not present

## 2015-03-06 LAB — CBC WITH DIFFERENTIAL/PLATELET
Basophils Absolute: 0.1 10*3/uL (ref 0.0–0.1)
Basophils Relative: 1 %
Eosinophils Absolute: 0.2 10*3/uL (ref 0.0–0.7)
Eosinophils Relative: 3 %
HCT: 41.8 % (ref 36.0–46.0)
Hemoglobin: 13.4 g/dL (ref 12.0–15.0)
Lymphocytes Relative: 29 %
Lymphs Abs: 1.7 10*3/uL (ref 0.7–4.0)
MCH: 31.2 pg (ref 26.0–34.0)
MCHC: 32.1 g/dL (ref 30.0–36.0)
MCV: 97.4 fL (ref 78.0–100.0)
Monocytes Absolute: 0.6 10*3/uL (ref 0.1–1.0)
Monocytes Relative: 9 %
Neutro Abs: 3.4 10*3/uL (ref 1.7–7.7)
Neutrophils Relative %: 58 %
Platelets: 212 10*3/uL (ref 150–400)
RBC: 4.29 MIL/uL (ref 3.87–5.11)
RDW: 14.2 % (ref 11.5–15.5)
WBC: 6 10*3/uL (ref 4.0–10.5)

## 2015-03-06 LAB — COMPREHENSIVE METABOLIC PANEL
ALT: 10 U/L — ABNORMAL LOW (ref 14–54)
AST: 12 U/L — ABNORMAL LOW (ref 15–41)
Albumin: 3.5 g/dL (ref 3.5–5.0)
Alkaline Phosphatase: 85 U/L (ref 38–126)
Anion gap: 6 (ref 5–15)
BUN: 13 mg/dL (ref 6–20)
CO2: 27 mmol/L (ref 22–32)
Calcium: 9.1 mg/dL (ref 8.9–10.3)
Chloride: 103 mmol/L (ref 101–111)
Creatinine, Ser: 1.11 mg/dL — ABNORMAL HIGH (ref 0.44–1.00)
GFR calc Af Amer: 55 mL/min — ABNORMAL LOW (ref >60)
GFR calc non Af Amer: 48 mL/min — ABNORMAL LOW (ref >60)
Glucose, Bld: 111 mg/dL — ABNORMAL HIGH (ref 65–99)
Potassium: 4 mmol/L (ref 3.5–5.1)
Sodium: 136 mmol/L (ref 135–145)
Total Bilirubin: 0.3 mg/dL (ref 0.3–1.2)
Total Protein: 6.5 g/dL (ref 6.5–8.1)

## 2015-03-06 LAB — URINALYSIS, ROUTINE W REFLEX MICROSCOPIC
Bilirubin Urine: NEGATIVE
Glucose, UA: NEGATIVE mg/dL
Ketones, ur: NEGATIVE mg/dL
Nitrite: POSITIVE — AB
Protein, ur: NEGATIVE mg/dL
Specific Gravity, Urine: 1.015 (ref 1.005–1.030)
Urobilinogen, UA: 0.2 mg/dL (ref 0.0–1.0)
pH: 5.5 (ref 5.0–8.0)

## 2015-03-06 LAB — URINE MICROSCOPIC-ADD ON

## 2015-03-06 LAB — LIPASE, BLOOD: Lipase: 17 U/L (ref 11–51)

## 2015-03-06 MED ORDER — IOHEXOL 300 MG/ML  SOLN
50.0000 mL | Freq: Once | INTRAMUSCULAR | Status: DC | PRN
  Administered 2015-03-06: 50 mL via ORAL
  Filled 2015-03-06: qty 50

## 2015-03-06 MED ORDER — FENTANYL CITRATE (PF) 100 MCG/2ML IJ SOLN
50.0000 ug | Freq: Once | INTRAMUSCULAR | Status: AC
  Administered 2015-03-06: 50 ug via INTRAVENOUS
  Filled 2015-03-06: qty 2

## 2015-03-06 MED ORDER — POLYETHYLENE GLYCOL 3350 17 G PO PACK
17.0000 g | PACK | Freq: Every day | ORAL | Status: DC
  Administered 2015-03-06: 17 g via ORAL
  Filled 2015-03-06: qty 1

## 2015-03-06 MED ORDER — IOHEXOL 300 MG/ML  SOLN
100.0000 mL | Freq: Once | INTRAMUSCULAR | Status: AC | PRN
  Administered 2015-03-06: 100 mL via INTRAVENOUS

## 2015-03-06 MED ORDER — DEXTROSE 5 % IV SOLN
1.0000 g | Freq: Once | INTRAVENOUS | Status: AC
  Administered 2015-03-06: 1 g via INTRAVENOUS
  Filled 2015-03-06: qty 10

## 2015-03-06 MED ORDER — CEPHALEXIN 500 MG PO CAPS: 500.0000 mg | ORAL_CAPSULE | Freq: Four times a day (QID) | ORAL | Status: DC

## 2015-03-06 NOTE — ED Notes (Signed)
Patient transported to CT 

## 2015-03-06 NOTE — ED Provider Notes (Signed)
CSN: [id number]     Arrival date & time 03/06/15  7846 History   First MD Initiated Contact with Patient 03/06/15 0719     Chief Complaint  Patient presents with  . Abdominal Pain     (Consider location/radiation/quality/duration/timing/severity/associated sxs/prior Treatment) HPI   74 year old female with abdominal pain. Gradual onset about 2 days ago. Relatively constant since then. Progressive. Describes diffuse pain. She reports that she has pain from "underneath my breasts all way down." Mild nausea. No vomiting. Feels distended. No urinary complaints. She fell she may be constipated and tried an enema. She did have a small bowel movement butdid not get any significant relief of her symptoms. No fever or chills. She describes what might be a history of bowel obstruction. 'They had to put a tube down my nose to break things up." Mentions "stricture" when speaking about this but I cannot corroborate through a cursory review of records. Denies past abdominal surgical history.   Past Medical History  Diagnosis Date  . Hypertension   . HEARING LOSS   . 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 Arthritis   . DVT (deep venous thrombosis) ([place])   . Phlebitis   . CKD (chronic kidney disease) stage 3, GFR 30-59 ml/min    Past Surgical History  Procedure Laterality Date  . Eye surgery    . Shoulder open rotator cuff repair  1973    right shoulder, secondary numbness in R hand  . Foot surgery    . Right wrist     Family History  Problem Relation Age of Onset  . Cancer Maternal Uncle     colon  . Cancer Maternal Grandmother     liver  . Cancer Mother     liver  . Cancer Brother     Unknown  . Cancer Sister     Unknown   Social History  Substance Use Topics  . Smoking status: Current Some Day Smoker -- 1.00 packs/day for 20 years    Types: Cigarettes  . Smokeless tobacco: Never Used     Comment: last cig May; on patches.  . Alcohol Use: No   OB  History    No data available     Review of Systems  All systems reviewed and negative, other than as noted in HPI.   Allergies  Codeine and Morphine  Home Medications   Prior to Admission medications   Medication Sig Start Date End Date Taking? Authorizing Provider  amLODipine (NORVASC) 10 MG tablet Take 1 tablet (10 mg total) by mouth daily. 02/07/15   Alexa Sherral Hammers, MD  diclofenac sodium (VOLTAREN) 1 % GEL Apply 2 g topically 4 (four) times daily. Patient not taking: Reported on 02/09/2015 01/24/15   Norman Herrlich, MD  furosemide (LASIX) 20 MG tablet Take 1 tablet (20 mg total) by mouth daily. 02/09/15   Wandra Arthurs, MD  mometasone-formoterol [facility]) 200-5 MCG/ACT AERO Inhale 2 puffs into the lungs 2 (two) times daily. Patient not taking: Reported on 02/09/2015 02/21/14   Blain Pais, MD  nicotine (NICODERM CQ - DOSED IN MG/24 HOURS) 14 mg/24hr patch Place 1 patch (14 mg total) onto the skin daily. Patient not taking: Reported on 02/09/2015 11/02/14   Lucious Groves, DO  nicotine (NICODERM CQ - DOSED IN MG/24 HOURS) 21 mg/24hr patch Place 21 mg onto the skin daily.    Historical Provider, MD  nystatin (MYCOSTATIN) 100000 UNIT/ML suspension Take  5 mLs (500,000 Units total) by mouth 4 (four) times daily. Patient not taking: Reported on 02/09/2015 02/17/14   Wilber Oliphant, MD  polyethylene glycol powder (GLYCOLAX/MIRALAX) powder Take 17 g by mouth once. Patient not taking: Reported on 02/09/2015 07/11/14   [name], MD  sennosides-docusate sodium (SENOKOT-S) 8.6-50 MG tablet Take 2 tablets by mouth daily. Patient not taking: Reported on 02/09/2015 07/11/14   [name], MD  tiotropium (SPIRIVA) 18 MCG inhalation capsule Place 1 capsule (18 mcg total) into inhaler and inhale daily. Patient not taking: Reported on 02/09/2015 02/21/14   Blain Pais, MD  traMADol (ULTRAM) 50 MG tablet Take 1 tablet (50 mg total) by mouth every 6 (six) hours as needed. 02/09/15   Wandra Arthurs, MD  Vitamin D, Cholecalciferol, 1000 UNITS CAPS Take 1 capsule by mouth daily. Patient not taking: Reported on 02/09/2015 11/02/14   Lucious Groves, DO   BP 142/58 mmHg  Pulse 66  Temp(Src) 97.6 F (36.4 C) (Oral)  Resp 16  Ht '5\' 4"'$  (1.626 m)  Wt 176 lb (79.833 kg)  BMI 30.20 kg/m2  SpO2 96% Physical Exam  Constitutional: She appears well-developed and well-nourished. No distress.  HENT:  Head: Normocephalic and atraumatic.  Eyes: Conjunctivae are normal. Right eye exhibits no discharge. Left eye exhibits no discharge.  Neck: Neck supple.  Cardiovascular: Normal rate, regular rhythm and normal heart sounds.  Exam reveals no gallop and no friction rub.   No murmur heard. Pulmonary/Chest: Effort normal and breath sounds normal. No respiratory distress.  Abdominal: Soft. She exhibits no distension. There is tenderness.  Mild-moderate ttp lower abdomen  Musculoskeletal: She exhibits no edema or tenderness.  Neurological: She is alert.  Skin: Skin is warm and dry.  Psychiatric: She has a normal mood and affect. Her behavior is normal. Thought content normal.  Nursing note and vitals reviewed.   ED Course  Procedures (including critical care time) Labs Review Labs Reviewed  URINALYSIS, ROUTINE W REFLEX MICROSCOPIC (NOT AT [facility]) - Abnormal; Notable for the following:    APPearance CLOUDY (*)    Hgb urine dipstick SMALL (*)    Nitrite POSITIVE (*)    Leukocytes, UA LARGE (*)    All other components within normal limits  COMPREHENSIVE METABOLIC PANEL - Abnormal; Notable for the following:    Glucose, Bld 111 (*)    Creatinine, Ser 1.11 (*)    AST 12 (*)    ALT 10 (*)    GFR calc non Af Amer 48 (*)    GFR calc Af Amer 55 (*)    All other components within normal limits  URINE MICROSCOPIC-ADD ON - Abnormal; Notable for the following:    Bacteria, UA MANY (*)    All other components within normal limits  URINE CULTURE  CBC WITH DIFFERENTIAL/PLATELET  LIPASE, BLOOD     Imaging Review Ct Abdomen Pelvis W Contrast  03/06/2015  CLINICAL DATA:  Abdominal pain and constipation. EXAM: CT ABDOMEN AND PELVIS WITH CONTRAST TECHNIQUE: Multidetector CT imaging of the abdomen and pelvis was performed using the standard protocol following bolus administration of intravenous contrast. CONTRAST:  170m OMNIPAQUE IOHEXOL 300 MG/ML  SOLN COMPARISON:  Abdominal x-rays on 07/11/2014 and prior CT of the abdomen and pelvis on 08/05/2009 FINDINGS: Visualized lung bases show scattered scarring and atelectasis bilaterally. The liver, spleen, pancreas and adrenal glands are unremarkable. There is a calcified gallstone again noted in the gallbladder lumen measuring approximately 13 mm. This is not associated  with gallbladder distention or inflammation. Bile ducts are not dilated. Stable 5 cm cyst of the left kidney has a benign appearance. No evidence of renal masses or hydronephrosis. There is a cystic structure in the retroperitoneum to the left of the aorta and just below the level of the takeoff of the left renal artery. This measures approximately 1.6 x 2.0 cm and demonstrates internal fluid density of 5 Hounsfield units. This was present in retrospect on the prior study in 2011 and was slightly smaller at that time, measuring approximately 1.4 x 1.8 cm. 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 An abnormal lymph node would have been expected to show more growth over a 5.5 year time span. Bowel shows moderate stool throughout the colon without evidence of fecal impaction. No small bowel dilatation identified. No evidence of free air, free fluid or abscess. No evidence of appendicitis. No hernias are seen. The uterus is small and postmenopausal. The bladder is low lying with potential component of mild cystocele. Bony structures show osteopenia and degenerative changes of the spine with stable mild loss of height of the L3 vertebral body. IMPRESSION: 1. No acute  findings. 2. Stable cholelithiasis with single calcified gallstone identified. No associated evidence to suggest biliary obstruction or cholecystitis. 3. 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 Although minimally larger in size, this is clearly cystic and benign in appearance. No further workup or follow-up would be necessary for this finding. 4. Low lying bladder with potential mild cystocele. Electronically Signed   By: Aletta Edouard M.D.   On: 03/06/2015 10:14   I have personally reviewed and evaluated these images and lab results as part of my medical decision-making.   EKG Interpretation None      MDM   Final diagnoses:  Generalized abdominal pain  UTI (lower urinary tract infection)    73yF with abdominal pain. Per review of office notes, pt seems to have a history of abdominal pain/constipation. She reports no improvement of current symptoms after having a small BM though and is diffusely tender on exam. Perhaps some mild distension. Will obtain basic labs. CT a/p.   Urinalysis consistent with UTI. We'll send culture. Dose of Rocephin in the ED. I feel further treatment as appropriate as an outpatient. Pt provided with enough money to fill keflex prescription as well as pick up some miralax.   Virgel Manifold, MD 03/08/15 [id number]

## 2015-03-06 NOTE — Discharge Instructions (Signed)
Abdominal Pain, Adult Many things can cause abdominal pain. Usually, abdominal pain is not caused by a disease and will improve without treatment. It can often be observed and treated at home. Your health care provider will do a physical exam and possibly order blood tests and X-rays to help determine the seriousness of your pain. However, in many cases, more time must pass before a clear cause of the pain can be found. Before that point, your health care provider may not know if you need more testing or further treatment. HOME CARE INSTRUCTIONS Monitor your abdominal pain for any changes. The following actions may help to alleviate any discomfort you are experiencing:  Only take over-the-counter or prescription medicines as directed by your health care provider.  Do not take laxatives unless directed to do so by your health care provider.  Try a clear liquid diet (broth, tea, or water) as directed by your health care provider. Slowly move to a bland diet as tolerated. SEEK MEDICAL CARE IF:  You have unexplained abdominal pain.  You have abdominal pain associated with nausea or diarrhea.  You have pain when you urinate or have a bowel movement.  You experience abdominal pain that wakes you in the night.  You have abdominal pain that is worsened or improved by eating food.  You have abdominal pain that is worsened with eating fatty foods.  You have a fever. SEEK IMMEDIATE MEDICAL CARE IF:  Your pain does not go away within 2 hours.  You keep throwing up (vomiting).  Your pain is felt only in portions of the abdomen, such as the right side or the left lower portion of the abdomen.  You pass bloody or black tarry stools. MAKE SURE YOU:  Understand these instructions.  Will watch your condition.  Will get help right away if you are not doing well or get worse.   This information is not intended to replace advice given to you by your health care provider. Make sure you discuss  any questions you have with your health care provider.   Document Released: 02/05/2005 Document Revised: 01/17/2015 Document Reviewed: 01/05/2013 Elsevier Interactive Patient Education 2016 Elsevier Inc[employer]  Urinary Tract Infection A urinary tract infection (UTI) can occur any place along the urinary tract. The tract includes the kidneys, ureters, bladder, and urethra. A type of germ called bacteria often causes a UTI. UTIs are often helped with antibiotic medicine.  HOME CARE   If given, take antibiotics as told by your doctor. Finish them even if you start to feel better.  Drink enough fluids to keep your pee (urine) clear or pale yellow.  Avoid tea, drinks with caffeine, and bubbly (carbonated) drinks.  Pee often. Avoid holding your pee in for a long time.  Pee before and after having sex (intercourse).  Wipe from front to back after you poop (bowel movement) if you are a woman. Use each tissue only once. GET HELP RIGHT AWAY IF:   You have back pain.  You have lower belly (abdominal) pain.  You have chills.  You feel sick to your stomach (nauseous).  You throw up (vomit).  Your burning or discomfort with peeing does not go away.  You have a fever.  Your symptoms are not better in 3 days. MAKE SURE YOU:   Understand these instructions.  Will watch your condition.  Will get help right away if you are not doing well or get worse.   This information is not intended to replace advice given  to you by your health care provider. Make sure you discuss any questions you have with your health care provider.   Document Released: 10/15/2007 Document Revised: 05/19/2014 Document Reviewed: 11/27/2011 Elsevier Interactive Patient Education [employer].

## 2015-03-06 NOTE — ED Notes (Signed)
Bed: WA09 Expected date:  Expected time:  Means of arrival:  Comments: abd pain, constipation

## 2015-03-06 NOTE — ED Notes (Signed)
Per EMS pt complaint generalized abdominal pain and constipation for two days; denies n/v/d; hx of stricture.

## 2015-03-08 ENCOUNTER — Encounter: Payer: Medicare Other | Admitting: Internal Medicine

## 2015-03-08 LAB — URINE CULTURE: Culture: >=100000

## 2015-03-09 ENCOUNTER — Telehealth (HOSPITAL_BASED_OUTPATIENT_CLINIC_OR_DEPARTMENT_OTHER): Payer: Self-pay | Admitting: Emergency Medicine

## 2015-03-09 NOTE — Progress Notes (Signed)
ED Antimicrobial Stewardship Positive Culture Follow Up   [name] is an 74 y.o. female who presented to [facility] on 03/06/2015 with a chief complaint of  Chief Complaint  Patient presents with  . Abdominal Pain    Recent Results (from the past 720 hour(s))  Urine culture     Status: None   Collection Time: 03/06/15  9:38 AM  Result Value Ref Range Status   Specimen Description URINE, CLEAN CATCH  Final   Special Requests NONE  Final   Culture   Final    >=100,000 COLONIES/mL ENTEROBACTER CLOACAE Performed at [facility]    Report Status 03/08/2015 FINAL  Final   Organism ID, Bacteria ENTEROBACTER CLOACAE  Final      Susceptibility   Enterobacter cloacae - MIC*    CEFAZOLIN >=64 RESISTANT Resistant     CEFTRIAXONE <=1 SENSITIVE Sensitive     CIPROFLOXACIN <=0.25 SENSITIVE Sensitive     GENTAMICIN <=1 SENSITIVE Sensitive     IMIPENEM <=0.25 SENSITIVE Sensitive     NITROFURANTOIN <=16 SENSITIVE Sensitive     TRIMETH/SULFA <=20 SENSITIVE Sensitive     PIP/TAZO <=4 SENSITIVE Sensitive     * >=100,000 COLONIES/mL ENTEROBACTER CLOACAE    '[x]'$  Treated with Keflex, organism resistant to prescribed antimicrobial  New antibiotic prescription: Bactrim DS bid x 5 days  ED Provider: Alvina Chou, PA-C  Assessment: 55 yoF presenting to ED with complaints of abdominal tenderness. In ED diagnosed with UTI and sent home on Keflex. UCx now growing bacteria resistant to Keflex. Plan to stop Keflex and change therapy to Bactrim DS bid x 5 days  Darl Pikes, PharmD Clinical Pharmacist- Resident Pager: [phone]  Darl Pikes 03/09/2015, 9:01 AM Infectious Diseases Pharmacist Phone# [phone]

## 2015-03-09 NOTE — Telephone Encounter (Signed)
Post ED Visit - Positive Culture Follow-up: Successful Patient Follow-Up  Culture assessed and recommendations reviewed by: '[]'$  [name], Pharm.D., BCPS-AQ ID '[]'$  [name], Pharm.D., BCPS '[]'$  [name], Pharm.D., BCPS, AAHIVP '[]'$  [name], Pharm.D., BCPS, AAHIVP '[]'$  [name], Pharm.D. '[]'$  [name], [place].D. [name] PharmD  Positive urine culture Enterobacter  '[]'$  Patient discharged without antimicrobial prescription and treatment is now indicated '[x]'$  Organism is resistant to prescribed ED discharge antimicrobial '[]'$  Patient with positive blood cultures  Changes discussed with ED provider: [name] PA New antibiotic prescription stop keflex, start bactrim DS bid x 5 days Called to n/a at present, pt to call back name of drugstore  Contacted patient, date 03/09/15 1640   [name] 03/09/2015, 4:40 PM

## 2015-03-12 ENCOUNTER — Telehealth (HOSPITAL_BASED_OUTPATIENT_CLINIC_OR_DEPARTMENT_OTHER): Payer: Self-pay

## 2015-03-13 ENCOUNTER — Telehealth (HOSPITAL_BASED_OUTPATIENT_CLINIC_OR_DEPARTMENT_OTHER): Payer: Self-pay | Admitting: Emergency Medicine

## 2015-03-19 ENCOUNTER — Encounter: Payer: Self-pay | Admitting: *Deleted

## 2015-03-20 ENCOUNTER — Encounter: Payer: Self-pay | Admitting: *Deleted

## 2015-03-21 ENCOUNTER — Encounter: Payer: Self-pay | Admitting: *Deleted

## 2015-04-07 ENCOUNTER — Emergency Department (HOSPITAL_COMMUNITY)
Admission: EM | Admit: 2015-04-07 | Discharge: 2015-04-07 | Disposition: A | Discharge status: Home / Self Care | Payer: Medicare Other | Attending: Emergency Medicine | Admitting: Emergency Medicine

## 2015-04-07 ENCOUNTER — Encounter (HOSPITAL_COMMUNITY): Payer: Self-pay | Admitting: Emergency Medicine

## 2015-04-07 DIAGNOSIS — Z79899 Other long term (current) drug therapy: Secondary | ICD-10-CM | POA: Diagnosis not present

## 2015-04-07 DIAGNOSIS — Z7951 Long term (current) use of inhaled steroids: Secondary | ICD-10-CM | POA: Insufficient documentation

## 2015-04-07 DIAGNOSIS — I129 Hypertensive chronic kidney disease with stage 1 through stage 4 chronic kidney disease, or unspecified chronic kidney disease: Secondary | ICD-10-CM | POA: Diagnosis not present

## 2015-04-07 DIAGNOSIS — H9209 Otalgia, unspecified ear: Secondary | ICD-10-CM | POA: Diagnosis not present

## 2015-04-07 DIAGNOSIS — J029 Acute pharyngitis, unspecified: Secondary | ICD-10-CM | POA: Insufficient documentation

## 2015-04-07 DIAGNOSIS — H540 Blindness, both eyes: Secondary | ICD-10-CM | POA: Diagnosis not present

## 2015-04-07 DIAGNOSIS — M1990 Unspecified osteoarthritis, unspecified site: Secondary | ICD-10-CM | POA: Diagnosis not present

## 2015-04-07 DIAGNOSIS — F17210 Nicotine dependence, cigarettes, uncomplicated: Secondary | ICD-10-CM | POA: Diagnosis not present

## 2015-04-07 DIAGNOSIS — N183 Chronic kidney disease, stage 3 (moderate): Secondary | ICD-10-CM | POA: Insufficient documentation

## 2015-04-07 DIAGNOSIS — Z792 Long term (current) use of antibiotics: Secondary | ICD-10-CM | POA: Diagnosis not present

## 2015-04-07 DIAGNOSIS — Z86718 Personal history of other venous thrombosis and embolism: Secondary | ICD-10-CM | POA: Insufficient documentation

## 2015-04-07 DIAGNOSIS — J028 Acute pharyngitis due to other specified organisms: Secondary | ICD-10-CM

## 2015-04-07 DIAGNOSIS — H9201 Otalgia, right ear: Secondary | ICD-10-CM | POA: Diagnosis not present

## 2015-04-07 DIAGNOSIS — B9789 Other viral agents as the cause of diseases classified elsewhere: Secondary | ICD-10-CM

## 2015-04-07 LAB — RAPID STREP SCREEN (MED CTR MEBANE ONLY): STREPTOCOCCUS, GROUP A SCREEN (DIRECT): NEGATIVE

## 2015-04-07 MED ORDER — TRAMADOL HCL 50 MG PO TABS: 50.0000 mg | ORAL_TABLET | Freq: Four times a day (QID) | ORAL | Status: DC | PRN

## 2015-04-07 MED ORDER — MAGIC MOUTHWASH
5.0000 mL | Freq: Once | ORAL | Status: AC
  Administered 2015-04-07: 5 mL via ORAL
  Filled 2015-04-07: qty 5

## 2015-04-07 NOTE — ED Notes (Signed)
[facility] called for transport.  

## 2015-04-07 NOTE — ED Notes (Signed)
Bed: WLPT1 Expected date:  Expected time:  Means of arrival:  Comments: 78F otalgia

## 2015-04-07 NOTE — ED Notes (Signed)
Per EMS, Patient c/o right ear pain radiates into her jaw. Patient is legally blind and uses hearing aids.

## 2015-04-07 NOTE — Discharge Instructions (Signed)
You may have a cold causing your ear pain, sore throat and neck pain.  Take tramadol as needed for pain and follow up with your doctor for further care.  Avoid taking too much tylenol.  Pharyngitis Pharyngitis is redness, pain, and swelling (inflammation) of your pharynx.  CAUSES  Pharyngitis is usually caused by infection. Most of the time, these infections are from viruses (viral) and are part of a cold. However, sometimes pharyngitis is caused by bacteria (bacterial). Pharyngitis can also be caused by allergies. Viral pharyngitis may be spread from person to person by coughing, sneezing, and personal items or utensils (cups, forks, spoons, toothbrushes). Bacterial pharyngitis may be spread from person to person by more intimate contact, such as kissing.  SIGNS AND SYMPTOMS  Symptoms of pharyngitis include:   Sore throat.   Tiredness (fatigue).   Low-grade fever.   Headache.  Joint pain and muscle aches.  Skin rashes.  Swollen lymph nodes.  Plaque-like film on throat or tonsils (often seen with bacterial pharyngitis). DIAGNOSIS  Your health care provider will ask you questions about your illness and your symptoms. Your medical history, along with a physical exam, is often all that is needed to diagnose pharyngitis. Sometimes, a rapid strep test is done. Other lab tests may also be done, depending on the suspected cause.  TREATMENT  Viral pharyngitis will usually get better in 3-4 days without the use of medicine. Bacterial pharyngitis is treated with medicines that kill germs (antibiotics).  HOME CARE INSTRUCTIONS   Drink enough water and fluids to keep your urine clear or pale yellow.   Only take over-the-counter or prescription medicines as directed by your health care provider:   If you are prescribed antibiotics, make sure you finish them even if you start to feel better.   Do not take aspirin.   Get lots of rest.   Gargle with 8 oz of salt water ( tsp of salt  per 1 qt of water) as often as every 1-2 hours to soothe your throat.   Throat lozenges (if you are not at risk for choking) or sprays may be used to soothe your throat. SEEK MEDICAL CARE IF:   You have large, tender lumps in your neck.  You have a rash.  You cough up green, yellow-brown, or bloody spit. SEEK IMMEDIATE MEDICAL CARE IF:   Your neck becomes stiff.  You drool or are unable to swallow liquids.  You vomit or are unable to keep medicines or liquids down.  You have severe pain that does not go away with the use of recommended medicines.  You have trouble breathing (not caused by a stuffy nose). MAKE SURE YOU:   Understand these instructions.  Will watch your condition.  Will get help right away if you are not doing well or get worse.   This information is not intended to replace advice given to you by your health care provider. Make sure you discuss any questions you have with your health care provider.   Document Released: 04/28/2005 Document Revised: 02/16/2013 Document Reviewed: 01/03/2013 Elsevier Interactive Patient Education [employer].

## 2015-04-07 NOTE — ED Provider Notes (Signed)
CSN: [id number]     Arrival date & time 04/07/15  0242 History   First MD Initiated Contact with Patient 04/07/15 0602     Chief Complaint  Patient presents with  . Otalgia    right     (Consider location/radiation/quality/duration/timing/severity/associated sxs/prior Treatment) HPI   74 year old female with history of chronic kidney disease, blind, hearing loss, hypertension, prior prior DVT brought here via EMS with complaints of sore throat and right ear pain. Patient report for the past week she has had throat discomfort with pain radiates to her right ear. Pain is persistent, worsening with swallowing and seems to be improving with taking Tylenol. However, once the Tylenol wears off she noticed the pain again. She denies having fever, hearing changes, runny nose, cough, sneezing, chest pain, shortness of breath. Denies having lightheadedness, dizziness or any recent injury. Patient wears hearing aid. No history of active cancer.  Past Medical History  Diagnosis Date  . Hypertension   . HEARING LOSS   . 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 Arthritis   . DVT (deep venous thrombosis) ([place])   . Phlebitis   . CKD (chronic kidney disease) stage 3, GFR 30-59 ml/min    Past Surgical History  Procedure Laterality Date  . Eye surgery    . Shoulder open rotator cuff repair  1973    right shoulder, secondary numbness in R hand  . Foot surgery    . Right wrist     Family History  Problem Relation Age of Onset  . Cancer Maternal Uncle     colon  . Cancer Maternal Grandmother     liver  . Cancer Mother     liver  . Cancer Brother     Unknown  . Cancer Sister     Unknown   Social History  Substance Use Topics  . Smoking status: Current Some Day Smoker -- 1.00 packs/day for 20 years    Types: Cigarettes  . Smokeless tobacco: Never Used     Comment: last cig May; on patches.  . Alcohol Use: No   OB History    No data available     Review of  Systems  All other systems reviewed and are negative.     Allergies  Codeine and Morphine  Home Medications   Prior to Admission medications   Medication Sig Start Date End Date Taking? Authorizing Provider  acetaminophen (TYLENOL) 500 MG tablet Take 1,000 mg by mouth every 6 (six) hours as needed for mild pain, moderate pain, fever or headache.    Historical Provider, MD  amLODipine (NORVASC) 10 MG tablet Take 1 tablet (10 mg total) by mouth daily. 02/07/15   Alexa Sherral Hammers, MD  cephALEXin (KEFLEX) 500 MG capsule Take 1 capsule (500 mg total) by mouth 4 (four) times daily. 03/06/15   Virgel Manifold, MD  diclofenac sodium (VOLTAREN) 1 % GEL Apply 2 g topically 4 (four) times daily. Patient not taking: Reported on 02/09/2015 01/24/15   Norman Herrlich, MD  furosemide (LASIX) 20 MG tablet Take 1 tablet (20 mg total) by mouth daily. Patient not taking: Reported on 03/06/2015 02/09/15   Wandra Arthurs, MD  mometasone-formoterol [facility]) 200-5 MCG/ACT AERO Inhale 2 puffs into the lungs 2 (two) times daily. Patient not taking: Reported on 02/09/2015 02/21/14   Blain Pais, MD  nicotine (NICODERM CQ - DOSED IN MG/24 HOURS) 14 mg/24hr patch Place 1 patch (14 mg total)  onto the skin daily. Patient not taking: Reported on 02/09/2015 11/02/14   Lucious Groves, DO  nicotine (NICODERM CQ - DOSED IN MG/24 HOURS) 21 mg/24hr patch Place 21 mg onto the skin daily.    Historical Provider, MD  nystatin (MYCOSTATIN) 100000 UNIT/ML suspension Take 5 mLs (500,000 Units total) by mouth 4 (four) times daily. Patient not taking: Reported on 02/09/2015 02/17/14   Wilber Oliphant, MD  polyethylene glycol powder (GLYCOLAX/MIRALAX) powder Take 17 g by mouth once. Patient not taking: Reported on 02/09/2015 07/11/14   [name], MD  sennosides-docusate sodium (SENOKOT-S) 8.6-50 MG tablet Take 2 tablets by mouth daily. Patient not taking: Reported on 02/09/2015 07/11/14   [name], MD  tiotropium (SPIRIVA) 18  MCG inhalation capsule Place 1 capsule (18 mcg total) into inhaler and inhale daily. Patient not taking: Reported on 02/09/2015 02/21/14   Blain Pais, MD  traMADol (ULTRAM) 50 MG tablet Take 1 tablet (50 mg total) by mouth every 6 (six) hours as needed. Patient not taking: Reported on 03/06/2015 02/09/15   Wandra Arthurs, MD  Vitamin D, Cholecalciferol, 1000 UNITS CAPS Take 1 capsule by mouth daily. Patient not taking: Reported on 02/09/2015 11/02/14   Lucious Groves, DO   BP 131/72 mmHg  Pulse 82  Temp(Src) 98.4 F (36.9 C) (Oral)  Resp 18  Ht '5\' 3"'$  (1.6 m)  Wt 79.833 kg  BMI 31.18 kg/m2  SpO2 97% Physical Exam  Constitutional: She appears well-developed and well-nourished. No distress.  African-American female appears to be in no acute discomfort.  HENT:  Head: Atraumatic.  Ears: Hearing aid was removed, normal ear canal and normal TM bilaterally Nose: normal nares Throat: uvula is midline, no tonsillar enlargement or exudates.  Eyes: Conjunctivae are normal.  Neck: Neck supple.  No nuchal rigidity. No carotid bruit. Tenderness to right side of neck along the SCM without any swelling, or overlying skin changes. No TMJ pain.  NO mastoiditis  Cardiovascular: Normal rate and regular rhythm.   Pulmonary/Chest: She has wheezes.  Faint inspiratory and expiratory wheezes.  Abdominal: Soft. There is no tenderness.  Neurological: She is alert.  Skin: No rash noted.  Psychiatric: She has a normal mood and affect.  Nursing note and vitals reviewed.   ED Course  Procedures (including critical care time)  Patient here complaining of sore throat and right ear pain. She has receiving producible pain around the SCM region. No evidence of mastoiditis. No signs of infection on exam and her throat exam is unremarkable. She has no carotid bruit and does not have symptoms to suggest cerebral artery dissection considering the duration of her symptoms. She is afebrile with stable normal vital  signs. She is well appearing.  8:06 AM Strep test negative.  Care discussed with Dr. Kathrynn Humble who also evaluate pt and felt pt stable for discharge. Tramadol prescribed for pain.  Pt to f/u with PCP.  She did cough a few times and has scattered wheezes on exam.  Suspect viral etiology and less likely temporal arteritis, cerebral artery dissection, meningitis, mastoiditis or other acute emergent medical condition  Labs Review Labs Reviewed  RAPID STREP SCREEN (NOT AT [facility])  CULTURE, GROUP A STREP    Imaging Review No results found. I have personally reviewed and evaluated these images and lab results as part of my medical decision-making.   EKG Interpretation None      MDM   Final diagnoses:  Sore throat (viral)  Right ear pain  BP 142/79 mmHg  Pulse 66  Temp(Src) 98 F (36.7 C) (Oral)  Resp 20  Ht '5\' 3"'$  (1.6 m)  Wt 79.833 kg  BMI 31.18 kg/m2  SpO2 96%     Domenic Moras, PA-C 04/07/15 0272  Varney Biles, MD 04/07/15 2344

## 2015-04-09 LAB — CULTURE, GROUP A STREP: STREP A CULTURE: NEGATIVE

## 2015-04-12 ENCOUNTER — Encounter: Payer: Medicare Other | Admitting: Internal Medicine

## 2015-04-19 DIAGNOSIS — L603 Nail dystrophy: Secondary | ICD-10-CM | POA: Diagnosis not present

## 2015-04-19 DIAGNOSIS — L84 Corns and callosities: Secondary | ICD-10-CM | POA: Diagnosis not present

## 2015-04-19 DIAGNOSIS — I739 Peripheral vascular disease, unspecified: Secondary | ICD-10-CM | POA: Diagnosis not present

## 2015-05-16 ENCOUNTER — Encounter: Payer: Medicare Other | Admitting: Internal Medicine

## 2015-06-13 ENCOUNTER — Encounter: Payer: Self-pay | Admitting: Internal Medicine

## 2015-06-13 ENCOUNTER — Ambulatory Visit (INDEPENDENT_AMBULATORY_CARE_PROVIDER_SITE_OTHER): Payer: Medicare Other | Admitting: Internal Medicine

## 2015-06-13 VITALS — BP 125/75 | HR 86 | Temp 97.5°F | Wt 169.1 lb

## 2015-06-13 DIAGNOSIS — N183 Chronic kidney disease, stage 3 unspecified: Secondary | ICD-10-CM

## 2015-06-13 DIAGNOSIS — I129 Hypertensive chronic kidney disease with stage 1 through stage 4 chronic kidney disease, or unspecified chronic kidney disease: Secondary | ICD-10-CM | POA: Diagnosis not present

## 2015-06-13 DIAGNOSIS — D6859 Other primary thrombophilia: Secondary | ICD-10-CM

## 2015-06-13 DIAGNOSIS — H540 Blindness, both eyes: Secondary | ICD-10-CM

## 2015-06-13 DIAGNOSIS — F17200 Nicotine dependence, unspecified, uncomplicated: Secondary | ICD-10-CM

## 2015-06-13 DIAGNOSIS — J440 Chronic obstructive pulmonary disease with acute lower respiratory infection: Secondary | ICD-10-CM

## 2015-06-13 DIAGNOSIS — H548 Legal blindness, as defined in USA: Secondary | ICD-10-CM

## 2015-06-13 DIAGNOSIS — R600 Localized edema: Secondary | ICD-10-CM

## 2015-06-13 DIAGNOSIS — I10 Essential (primary) hypertension: Secondary | ICD-10-CM

## 2015-06-13 MED ORDER — HYDROCHLOROTHIAZIDE 12.5 MG PO CAPS: 12.5000 mg | ORAL_CAPSULE | Freq: Every day | ORAL | Status: DC

## 2015-06-13 MED ORDER — ALBUTEROL SULFATE HFA 108 (90 BASE) MCG/ACT IN AERS: 2.0000 | INHALATION_SPRAY | Freq: Four times a day (QID) | RESPIRATORY_TRACT | Status: DC | PRN

## 2015-06-13 NOTE — Progress Notes (Signed)
   Subjective:    Patient ID: [name], female    DOB: Jul 22, 1940, 75 y.o.   MRN: [id number]  CC: Follow up for HTN and LE edema  HPI  [name] is a 75yo woman with PMH of COPD (not on medications now), HTN, HLD, and chronic LE edema along with history of DVTs and ? Hypercoagulable state (previously checked in 2010 and 2014, labs do not reliably show a hypercoagulable state).    LE edema, Left > right, [place] just checked in August, no clot.  Better with lying down.  She is convinced she does not have a clot.  She is not taking her lasix.    HTN, worried amlodipine may be causing swelling, but she is rather resistant to new medications.    SOB about every 2-3 days, used to use albuterol.  Now just stops smoking for the day.  Smoking 1/2 PPD.  She was very circumspect about her symptoms.    Brought in meds, only one taking is amlodipine.   She was very rushed to leave as her caretaker "had to be somewhere."  She was only really interested in talking about getting her amlodipine filled.  She does not have good medical literacy and was not aware of the medications that are on her list or what she takes them for.   Meds reviewed, only on amlodipine.    Review of Systems  Constitutional: Negative for fever and fatigue.  HENT: Positive for hearing loss (hard of hearing).   Eyes: Positive for visual disturbance (blind).  Respiratory: Positive for shortness of breath and wheezing. Negative for cough and choking.   Cardiovascular: Negative for chest pain and leg swelling.  Musculoskeletal: Positive for back pain and arthralgias.  Neurological: Negative for dizziness, weakness and light-headedness.       Objective:   Physical Exam  Constitutional:  Elderly woman, NAD.  Speaking in complete sentences, though very hard of hearing.   HENT:  Head: Normocephalic and atraumatic.  Eyes: No scleral icterus.  She is legally blind, clouded corneas  Cardiovascular: Normal rate, regular  rhythm and normal heart sounds.   No murmur heard. Pulmonary/Chest: Effort normal. No respiratory distress. She has no wheezes.  Decreased breath sounds throughout  Abdominal: Soft. Bowel sounds are normal.  Musculoskeletal: She exhibits edema (worse on the left). She exhibits no tenderness.  Skin: Skin is warm and dry. No rash noted. No erythema.    CMET today      Assessment & Plan:  RTC in 1 month for BP check.

## 2015-06-13 NOTE — Patient Instructions (Signed)
Ms. Due - -   For your medications  Please STOP taking amlodipine  Please START taking HCTZ (hydrochlorothiazide), more information below.   Please come back to the clinic to see me in 1 month to see how you are doing on this new medication.   Thank you!  Hydrochlorothiazide, HCTZ capsules or tablets What is this medicine? HYDROCHLOROTHIAZIDE (hye droe klor oh THYE a zide) is a diuretic. It increases the amount of urine passed, which causes the body to lose salt and water. This medicine is used to treat high blood pressure. It is also reduces the swelling and water retention caused by various medical conditions, such as heart, liver, or kidney disease. This medicine may be used for other purposes; ask your health care provider or pharmacist if you have questions. What should I tell my health care provider before I take this medicine? They need to know if you have any of these conditions: -diabetes -gout -immune system problems, like lupus -kidney disease or kidney stones -liver disease -pancreatitis -small amount of urine or difficulty passing urine -an unusual or allergic reaction to hydrochlorothiazide, sulfa drugs, other medicines, foods, dyes, or preservatives -pregnant or trying to get pregnant -breast-feeding How should I use this medicine? Take this medicine by mouth with a glass of water. Follow the directions on the prescription label. Take your medicine at regular intervals. Remember that you will need to pass urine frequently after taking this medicine. Do not take your doses at a time of day that will cause you problems. Do not stop taking your medicine unless your doctor tells you to. Talk to your pediatrician regarding the use of this medicine in children. Special care may be needed. Overdosage: If you think you have taken too much of this medicine contact a poison control center or emergency room at once. NOTE: This medicine is only for you. Do not share this medicine  with others. What if I miss a dose? If you miss a dose, take it as soon as you can. If it is almost time for your next dose, take only that dose. Do not take double or extra doses. What may interact with this medicine? -cholestyramine -colestipol -digoxin -dofetilide -lithium -medicines for blood pressure -medicines for diabetes -medicines that relax muscles for surgery -other diuretics -steroid medicines like prednisone or cortisone This list may not describe all possible interactions. Give your health care provider a list of all the medicines, herbs, non-prescription drugs, or dietary supplements you use. Also tell them if you smoke, drink alcohol, or use illegal drugs. Some items may interact with your medicine. What should I watch for while using this medicine? Visit your doctor or health care professional for regular checks on your progress. Check your blood pressure as directed. Ask your doctor or health care professional what your blood pressure should be and when you should contact him or her. You may need to be on a special diet while taking this medicine. Ask your doctor. Check with your doctor or health care professional if you get an attack of severe diarrhea, nausea and vomiting, or if you sweat a lot. The loss of too much body fluid can make it dangerous for you to take this medicine. You may get drowsy or dizzy. Do not drive, use machinery, or do anything that needs mental alertness until you know how this medicine affects you. Do not stand or sit up quickly, especially if you are an older patient. This reduces the risk of dizzy or fainting  spells. Alcohol may interfere with the effect of this medicine. Avoid alcoholic drinks. This medicine may affect your blood sugar level. If you have diabetes, check with your doctor or health care professional before changing the dose of your diabetic medicine. This medicine can make you more sensitive to the sun. Keep out of the sun. If you  cannot avoid being in the sun, wear protective clothing and use sunscreen. Do not use sun lamps or tanning beds/booths. What side effects may I notice from receiving this medicine? Side effects that you should report to your doctor or health care professional as soon as possible: -allergic reactions such as skin rash or itching, hives, swelling of the lips, mouth, tongue, or throat -changes in vision -chest pain -eye pain -fast or irregular heartbeat -feeling faint or lightheaded, falls -gout attack -muscle pain or cramps -pain or difficulty when passing urine -pain, tingling, numbness in the hands or feet -redness, blistering, peeling or loosening of the skin, including inside the mouth -unusually weak or tired Side effects that usually do not require medical attention (report to your doctor or health care professional if they continue or are bothersome): -change in sex drive or performance -dry mouth -headache -stomach upset This list may not describe all possible side effects. Call your doctor for medical advice about side effects. You may report side effects to FDA at 1-800-FDA-1088. Where should I keep my medicine? Keep out of the reach of children. Store at room temperature between 15 and 30 degrees C (59 and 86 degrees F). Do not freeze. Protect from light and moisture. Keep container closed tightly. Throw away any unused medicine after the expiration date. NOTE: This sheet is a summary. It may not cover all possible information. If you have questions about this medicine, talk to your doctor, pharmacist, or health care provider.    2016, Elsevier/[employer] Standard. (2009-12-21 12:57:37)

## 2015-06-19 NOTE — Assessment & Plan Note (Signed)
L > R.  She has a history of DVT on the left per her.  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  She is resistant to getting any sort of doppler study and notes that she "just had one."    Given that her amlodipine may be worsening the situation and that she should likely be on a diuretic, I have asked her to STOP amlodipine and START HCTZ.  She will return in 1 month for further evaluation.

## 2015-06-19 NOTE — Assessment & Plan Note (Signed)
She expresses no urinary symptoms.  We got a CMET today and her renal function is stable.   BMET after being on HCTZ.

## 2015-06-19 NOTE — Assessment & Plan Note (Signed)
Did a chart review and assessment of labs done.  Reviewed labs with Dr. Beryle Beams.  Patient likely DOES NOT have a hypercoagulable disease given her most recent panel OFF of anticoagulation did not detect lupus anticoagulant and all other labs were near normal.  She has had no further clots.  + lab work previously in 2013 were likely drawn too close to previous [facility] therapy (2 weeks per report in chart).  Patient does not believe she has a disorder and is resistant to further therapy.  Will have close vigilance for any further clots and patient is aware of signs/symptoms.  (reviewed labs and discussion by Dr. Doy Mince in April of 2014)  Will resolve from problem list.

## 2015-06-19 NOTE — Assessment & Plan Note (Signed)
She is blind.  Has caretakers who help her get places and possibly help with her medications.  She said they "sometimes" do.  She is currently on one medication for ease.  If she should need more medications in future may consider blister packs or home health aid if she qualifies.

## 2015-06-19 NOTE — Assessment & Plan Note (Signed)
BP today is well controlled at 125/75.  She is taking only amlodipine.   Given other issues noted above including LE edema, will CHANGE amlodipine to HCTZ 12.5 Qdaily.  Na is normal on CMET today.   See back in 1 month for BP check and BMET.

## 2015-06-19 NOTE — Assessment & Plan Note (Signed)
She was previously on 2 inhalers (spiriva and dulera is what it looked like) but she reports not having these and not taking them for "months."  She has occasional SOB and still smokes.  She is aware that she should quit smoking.   Restart albuterol prn inhaler to start.  Consider adding medications based on symptoms.

## 2015-07-11 ENCOUNTER — Ambulatory Visit: Payer: Medicare Other | Admitting: Internal Medicine

## 2015-07-17 ENCOUNTER — Ambulatory Visit: Payer: Medicare Other | Admitting: Internal Medicine

## 2015-07-17 DIAGNOSIS — L84 Corns and callosities: Secondary | ICD-10-CM | POA: Diagnosis not present

## 2015-07-17 DIAGNOSIS — L603 Nail dystrophy: Secondary | ICD-10-CM | POA: Diagnosis not present

## 2015-07-17 DIAGNOSIS — I739 Peripheral vascular disease, unspecified: Secondary | ICD-10-CM | POA: Diagnosis not present

## 2015-07-18 ENCOUNTER — Ambulatory Visit: Payer: Medicare Other | Admitting: Internal Medicine

## 2015-08-15 ENCOUNTER — Ambulatory Visit: Payer: Medicare Other | Admitting: Internal Medicine

## 2015-08-15 ENCOUNTER — Ambulatory Visit (INDEPENDENT_AMBULATORY_CARE_PROVIDER_SITE_OTHER): Payer: Medicare Other | Admitting: Internal Medicine

## 2015-08-15 ENCOUNTER — Encounter: Payer: Self-pay | Admitting: Internal Medicine

## 2015-08-15 ENCOUNTER — Other Ambulatory Visit: Payer: Self-pay | Admitting: Internal Medicine

## 2015-08-15 VITALS — BP 132/62 | HR 82 | Temp 97.3°F | Wt 169.6 lb

## 2015-08-15 DIAGNOSIS — E559 Vitamin D deficiency, unspecified: Secondary | ICD-10-CM

## 2015-08-15 DIAGNOSIS — Z0000 Encounter for general adult medical examination without abnormal findings: Secondary | ICD-10-CM

## 2015-08-15 DIAGNOSIS — R7989 Other specified abnormal findings of blood chemistry: Secondary | ICD-10-CM

## 2015-08-15 DIAGNOSIS — Z780 Asymptomatic menopausal state: Secondary | ICD-10-CM

## 2015-08-15 DIAGNOSIS — N183 Chronic kidney disease, stage 3 unspecified: Secondary | ICD-10-CM

## 2015-08-15 DIAGNOSIS — Z79899 Other long term (current) drug therapy: Secondary | ICD-10-CM | POA: Diagnosis not present

## 2015-08-15 DIAGNOSIS — J440 Chronic obstructive pulmonary disease with acute lower respiratory infection: Secondary | ICD-10-CM

## 2015-08-15 DIAGNOSIS — I129 Hypertensive chronic kidney disease with stage 1 through stage 4 chronic kidney disease, or unspecified chronic kidney disease: Secondary | ICD-10-CM | POA: Diagnosis not present

## 2015-08-15 DIAGNOSIS — Z7951 Long term (current) use of inhaled steroids: Secondary | ICD-10-CM | POA: Diagnosis not present

## 2015-08-15 DIAGNOSIS — Z1231 Encounter for screening mammogram for malignant neoplasm of breast: Secondary | ICD-10-CM

## 2015-08-15 DIAGNOSIS — F17200 Nicotine dependence, unspecified, uncomplicated: Secondary | ICD-10-CM

## 2015-08-15 DIAGNOSIS — J449 Chronic obstructive pulmonary disease, unspecified: Secondary | ICD-10-CM | POA: Diagnosis not present

## 2015-08-15 DIAGNOSIS — I10 Essential (primary) hypertension: Secondary | ICD-10-CM | POA: Diagnosis not present

## 2015-08-15 DIAGNOSIS — E785 Hyperlipidemia, unspecified: Secondary | ICD-10-CM

## 2015-08-15 DIAGNOSIS — R946 Abnormal results of thyroid function studies: Secondary | ICD-10-CM | POA: Diagnosis not present

## 2015-08-15 DIAGNOSIS — R600 Localized edema: Secondary | ICD-10-CM

## 2015-08-15 DIAGNOSIS — R2232 Localized swelling, mass and lump, left upper limb: Secondary | ICD-10-CM

## 2015-08-15 MED ORDER — ALBUTEROL SULFATE HFA 108 (90 BASE) MCG/ACT IN AERS: 2.0000 | INHALATION_SPRAY | Freq: Four times a day (QID) | RESPIRATORY_TRACT | Status: AC | PRN

## 2015-08-15 MED ORDER — HYDROCHLOROTHIAZIDE 12.5 MG PO CAPS: 12.5000 mg | ORAL_CAPSULE | Freq: Every day | ORAL | Status: DC

## 2015-08-15 MED ORDER — VITAMIN D (CHOLECALCIFEROL) 25 MCG (1000 UT) PO CAPS: 1.0000 | ORAL_CAPSULE | Freq: Every day | ORAL | Status: DC

## 2015-08-15 NOTE — Patient Instructions (Signed)
[name] - -  Thank you for coming in today.   For your blood pressure please keep taking HCTZ.   You should be on vitamin D, please pick this up from the pharmacy.   For your constipation, please start taking dulcolax every 3 days.   Someone should call you to schedule your mammogram.   Thank you  Come back to see me in 3 months.

## 2015-08-15 NOTE — Progress Notes (Signed)
Subjective:    Patient ID: [name], female    DOB: Jan 15, 1941, 75 y.o.   MRN: [id number]  CC: 1 month follow up for HTN  HPI  [name] is a 75yo woman with PMH of HTN, hypothyroidism, CKD, vitamin D deficiency, osteopenia, chronic venous insufficiency with edema and tobacco use who presents for follow up.   For her HTN, BP is well controlled today.  She reports that her edema is not much better, but on exam, her edema is much improved.  She has no urinary symptoms today and no new complaints.  She is somewhat confused with her medications, and this is partly due to her blindness.  She reports both improved swelling and worsened swelling with HCTZ.  She does report that the HCTZ is causing her to be constipated.  Per chart review, however, this is a very chronic condition which has been going on for quite a while.  She has been taking dulcolax weekly to have a bowel movement.  She was previously prescribed senna-colace but she has not picked that medication up and did not take it.  She was also previously diagnosed with subclinical hypothyroidism and was going to be started on thyroid medication, but the patient does not remember discussing this with [name].  I talked about her bowel movements with [name] and recommended colace and miralax, however, she is not interested in these medications because of cost (likely < $15/month).  I advised her to take her dulcolax every other day, but this idea "shocked" her because going to the bathroom every other day was "too much."  She reports that going ever 3-4 days would be "just right."  I therefore advised her to take the dulcolax every 3rd or 4th day.    Other issues we discussed were her thyroid and the possibility that some of her constipation could be explained from hypothyroidism.  She again repeated that she has only been constipated for "a week."    We discussed at length bone health and her vitamin D deficiency.  She reports  that she will take the vitamin D replacement, refill will be sent.   I think there is a large proportion of health literacy that is making full understanding of her health issues understandable to her.  She is also legally blind and has difficulty with explaining her symptoms.  She will likely need to be followed every 3 months for good follow up.   She also complains of an itchy bump in her left armpit that has been there for "years."    She continues to smoke.  She is not interested in quitting.    Review of Systems  Constitutional: Negative for fever and chills.  Eyes: Positive for visual disturbance. Negative for pain.  Respiratory: Positive for cough and wheezing. Negative for shortness of breath.   Cardiovascular: Positive for leg swelling. Negative for chest pain.  Gastrointestinal: Positive for abdominal pain and constipation. Negative for nausea.  Neurological: Negative for dizziness and syncope.       Objective:   Physical Exam  Constitutional: She is oriented to person, place, and time.  Elderly woman, NAD, alert and oriented  HENT:  Head: Normocephalic and atraumatic.  Cardiovascular: Normal rate, regular rhythm and normal heart sounds.   No murmur heard. Pulmonary/Chest: Effort normal. No respiratory distress. She has wheezes (mild wheezing).  Abdominal: Soft. Bowel sounds are normal. She exhibits no distension. There is no tenderness.  Musculoskeletal: Edema: She has some mild  non pitting edema to her knees bilaterally, thought this is much improved from previous visits.   She has a mobile nickel sized cyst in her left armpit, anterior to axilla, not deep, mobile, non painful with indentation on at the 9 o'clock position.  She reports that it sometimes drains, no expressible fluid today.   Neurological: She is alert and oriented to person, place, and time. No cranial nerve deficit.  Skin: Skin is warm and dry. No rash noted. No erythema.  Psychiatric: She has a normal  mood and affect. Her behavior is normal.    CMET, vitamin D, TSH, free T4 today      Assessment & Plan:  RTC in 3 months  Elevated TSH On recheck today, TSH and free T4 normal, no need to treat  Sebaceous cyst She pointed out a nickel sized sebaceous cyst on exam today.  It has a puntum on the top of the cyst, is very superficial, non painful and occasionally drains.  I offered her surgical removal, which she was not interested in.  She has no breast complaints, however, she is due for a mammogram.  I do not think this superficial lesion which is actually anterior to the axilla and has been there for years per patient is concerning at this time.  Will do screening mammogram as she is due.

## 2015-08-16 DIAGNOSIS — L97521 Non-pressure chronic ulcer of other part of left foot limited to breakdown of skin: Secondary | ICD-10-CM | POA: Diagnosis not present

## 2015-08-16 LAB — CMP14 + ANION GAP
ALK PHOS: 107 IU/L (ref 39–117)
ALT: 6 IU/L (ref 0–32)
ANION GAP: 18 mmol/L (ref 10.0–18.0)
AST: 13 IU/L (ref 0–40)
Albumin/Globulin Ratio: 1.5 (ref 1.2–2.2)
Albumin: 4 g/dL (ref 3.5–4.8)
BUN/Creatinine Ratio: 18 (ref 12–28)
BUN: 23 mg/dL (ref 8–27)
Bilirubin Total: 0.4 mg/dL (ref 0.0–1.2)
CALCIUM: 9.5 mg/dL (ref 8.7–10.3)
CHLORIDE: 100 mmol/L (ref 96–106)
CO2: 23 mmol/L (ref 18–29)
CREATININE: 1.29 mg/dL — AB (ref 0.57–1.00)
GFR calc Af Amer: 47 mL/min/{1.73_m2} — ABNORMAL LOW (ref >59)
GFR, EST NON AFRICAN AMERICAN: 41 mL/min/{1.73_m2} — AB (ref >59)
Globulin, Total: 2.6 g/dL (ref 1.5–4.5)
Glucose: 102 mg/dL — ABNORMAL HIGH (ref 65–99)
POTASSIUM: 4.4 mmol/L (ref 3.5–5.2)
Sodium: 141 mmol/L (ref 134–144)
Total Protein: 6.6 g/dL (ref 6.0–8.5)

## 2015-08-16 LAB — VITAMIN D 25 HYDROXY (VIT D DEFICIENCY, FRACTURES): Vit D, 25-Hydroxy: 22 ng/mL — ABNORMAL LOW (ref 30.0–100.0)

## 2015-08-16 LAB — TSH: TSH: 2.26 u[IU]/mL (ref 0.450–4.500)

## 2015-08-16 LAB — T4, FREE: Free T4: 1.14 ng/dL (ref 0.82–1.77)

## 2015-08-16 NOTE — Assessment & Plan Note (Signed)
BP well controlled today at 132/62.  Renal function as noted above.   Overall, she is doing well on low dose HCTZ.  Continue for now.   She reports constipation with this medication, however, she has had chronic constipation for a long time prior to starting this medication based on chart review.  We discussed this at length and she did not desire any new medications such as miralax and colace.  She has no BM per her for a week and then will take a dulcolax.  She was somewhat shocked when I advised fiber and possible every other day dulcolax to have a BM every day or 2.  "that would destroy my stomach!"  She noted that she, at most, would only want to have a BM every 4th day.  I then advised her to take dulcolax every fourth day to which she responded "I might try that." I do not think that HCTZ is the culprit here.

## 2015-08-16 NOTE — Assessment & Plan Note (Signed)
No change in urinary symptoms today, no burning or pain on urination.   CMET to evaluate for renal function - Cr 1.29, Na and K WNL.   Continue HCTZ for BP control.  Cr is within her baseline.  Monitor yearly.

## 2015-08-16 NOTE — Assessment & Plan Note (Signed)
Checked today, 22.   Advised daily 1000 IU of vitamin D to further correct deficiency.

## 2015-08-16 NOTE — Assessment & Plan Note (Signed)
LDL last checked a few years ago, 108.  She is very resistant to new medications.  Consider checking lipid profile next visit.

## 2015-08-16 NOTE — Assessment & Plan Note (Signed)
She is precontemplative about quitting.  I did discuss with her today how her smoking is affecting her breathing.  Further discussion at every visit.

## 2015-08-16 NOTE — Assessment & Plan Note (Signed)
She has been using her inhaler regularly.  Is not interested in long acting medications.  No acute issues today.  She does have a mild dry cough and occasional wheezing improved with albuterol.   Refill albuterol.

## 2015-08-16 NOTE — Assessment & Plan Note (Signed)
This has improved somewhat with the discontinuation of the amlodipine and initiated of the hctz.   She was supposed to be using compression stockings, but has not been.  I do not think she can get them on.   Continue current therapy, much improved off of amlodipine.

## 2015-10-17 DIAGNOSIS — I739 Peripheral vascular disease, unspecified: Secondary | ICD-10-CM | POA: Diagnosis not present

## 2015-10-17 DIAGNOSIS — L84 Corns and callosities: Secondary | ICD-10-CM | POA: Diagnosis not present

## 2015-10-17 DIAGNOSIS — L603 Nail dystrophy: Secondary | ICD-10-CM | POA: Diagnosis not present

## 2015-10-24 ENCOUNTER — Emergency Department (HOSPITAL_COMMUNITY)
Admission: EM | Admit: 2015-10-24 | Discharge: 2015-10-24 | Disposition: A | Discharge status: Home / Self Care | Payer: Medicare Other | Attending: Emergency Medicine | Admitting: Emergency Medicine

## 2015-10-24 ENCOUNTER — Encounter (HOSPITAL_COMMUNITY): Payer: Self-pay | Admitting: Emergency Medicine

## 2015-10-24 DIAGNOSIS — N183 Chronic kidney disease, stage 3 (moderate): Secondary | ICD-10-CM | POA: Insufficient documentation

## 2015-10-24 DIAGNOSIS — Z86718 Personal history of other venous thrombosis and embolism: Secondary | ICD-10-CM | POA: Diagnosis not present

## 2015-10-24 DIAGNOSIS — M1990 Unspecified osteoarthritis, unspecified site: Secondary | ICD-10-CM | POA: Insufficient documentation

## 2015-10-24 DIAGNOSIS — I129 Hypertensive chronic kidney disease with stage 1 through stage 4 chronic kidney disease, or unspecified chronic kidney disease: Secondary | ICD-10-CM | POA: Diagnosis not present

## 2015-10-24 DIAGNOSIS — Z79899 Other long term (current) drug therapy: Secondary | ICD-10-CM | POA: Diagnosis not present

## 2015-10-24 DIAGNOSIS — M79603 Pain in arm, unspecified: Secondary | ICD-10-CM | POA: Diagnosis not present

## 2015-10-24 DIAGNOSIS — F17210 Nicotine dependence, cigarettes, uncomplicated: Secondary | ICD-10-CM | POA: Diagnosis not present

## 2015-10-24 DIAGNOSIS — M79602 Pain in left arm: Secondary | ICD-10-CM | POA: Diagnosis present

## 2015-10-24 DIAGNOSIS — R6889 Other general symptoms and signs: Secondary | ICD-10-CM | POA: Diagnosis not present

## 2015-10-24 DIAGNOSIS — L089 Local infection of the skin and subcutaneous tissue, unspecified: Secondary | ICD-10-CM

## 2015-10-24 DIAGNOSIS — L723 Sebaceous cyst: Secondary | ICD-10-CM | POA: Diagnosis not present

## 2015-10-24 MED ORDER — LIDOCAINE HCL (PF) 1 % IJ SOLN
5.0000 mL | Freq: Once | INTRAMUSCULAR | Status: AC
  Administered 2015-10-24: 5 mL
  Filled 2015-10-24: qty 30

## 2015-10-24 MED ORDER — CEPHALEXIN 250 MG PO CAPS: 250.0000 mg | ORAL_CAPSULE | Freq: Four times a day (QID) | ORAL | Status: DC

## 2015-10-24 MED ORDER — ACETAMINOPHEN 325 MG PO TABS
650.0000 mg | ORAL_TABLET | Freq: Once | ORAL | Status: AC
  Administered 2015-10-24: 650 mg via ORAL
  Filled 2015-10-24: qty 2

## 2015-10-24 NOTE — ED Notes (Signed)
Pt was assisted to the bathroom. 

## 2015-10-24 NOTE — ED Notes (Signed)
Pt from home via EMS with complaints of pain under her left arm secondary to a cyst that has been present for 2 months. Pt is waiting for a mammogram for a diagnosis. Pt describes the pain as achy and woke her from her sleep

## 2015-10-24 NOTE — ED Notes (Signed)
Discharge instructions, follow up care, and rx x1 reviewed with patient. Patient verbalized understanding.

## 2015-10-24 NOTE — ED Provider Notes (Signed)
CSN: [id number]     Arrival date & time 10/24/15  0454 History   First MD Initiated Contact with Patient 10/24/15 0701     Chief Complaint  Patient presents with  . Arm Pain     (Consider location/radiation/quality/duration/timing/severity/associated sxs/prior Treatment) HPI  [name] is a 75 y.o. female who presents for evaluation of a sore, left axilla, which is enlarging and tender. She saw her PCP who recommended that she have a mammogram. She denies fever, chills, nausea, vomiting, cough, shortness of breath, weakness or dizziness. No prior similar problem. No history of breast cancer. There are no other known modifying factors.    Past Medical History  Diagnosis Date  . Hypertension   . HEARING LOSS   . 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 Arthritis   . DVT (deep venous thrombosis) ([facility])   . Phlebitis   . CKD (chronic kidney disease) stage 3, GFR 30-59 ml/min    Past Surgical History  Procedure Laterality Date  . Eye surgery    . Shoulder open rotator cuff repair  1973    right shoulder, secondary numbness in R hand  . Foot surgery    . Right wrist     Family History  Problem Relation Age of Onset  . Cancer Maternal Uncle     colon  . Cancer Maternal Grandmother     liver  . Cancer Mother     liver  . Cancer Brother     Unknown  . Cancer Sister     Unknown   Social History  Substance Use Topics  . Smoking status: Current Every Day Smoker -- 0.50 packs/day for 20 years    Types: Cigarettes  . Smokeless tobacco: Never Used     Comment: .  Marland Kitchen Alcohol Use: No   OB History    No data available     Review of Systems  All other systems reviewed and are negative.     Allergies  Codeine and Morphine  Home Medications   Prior to Admission medications   Medication Sig Start Date End Date Taking? Authorizing Provider  albuterol (PROVENTIL HFA;VENTOLIN HFA) 108 (90 Base) MCG/ACT inhaler Inhale 2 puffs into the lungs  every 6 (six) hours as needed for wheezing or shortness of breath. 08/15/15   Sid Falcon, MD  hydrochlorothiazide (MICROZIDE) 12.5 MG capsule Take 1 capsule (12.5 mg total) by mouth daily. 08/15/15   Sid Falcon, MD  Vitamin D, Cholecalciferol, 1000 units CAPS Take 1 capsule by mouth daily. 08/15/15   Sid Falcon, MD   BP 164/78 mmHg  Pulse 77  Temp(Src) 97.9 F (36.6 C) (Oral)  Resp 18  SpO2 100% Physical Exam  Constitutional: She is oriented to person, place, and time. She appears well-developed and well-nourished.  HENT:  Head: Normocephalic and atraumatic.  Right Ear: External ear normal.  Left Ear: External ear normal.  Eyes: Conjunctivae and EOM are normal. Pupils are equal, round, and reactive to light.  Neck: Normal range of motion and phonation normal. Neck supple.  Cardiovascular: Normal rate, regular rhythm and normal heart sounds.   Pulmonary/Chest: Effort normal and breath sounds normal. She exhibits no bony tenderness.  Normal left breast, no mass, lumps, nipple discharge or deformity.  Abdominal: Soft. There is no tenderness.  Musculoskeletal: Normal range of motion.  Neurological: She is alert and oriented to person, place, and time. No cranial nerve deficit or sensory deficit.  She exhibits normal muscle tone. Coordination normal.  Skin: Skin is warm, dry and intact.  Tender 2 x 3 cm mass left axilla with a overlying central umbilicated area, suggestive of infected sebaceous cyst.  Psychiatric: She has a normal mood and affect. Her behavior is normal. Judgment and thought content normal.  Nursing note and vitals reviewed.   ED Course  .Marland KitchenIncision and Drainage Date/Time: 10/24/2015 10:45 AM Performed by: Daleen Bo Authorized by: Daleen Bo Consent: Verbal consent obtained. Written consent not obtained. Risks and benefits: risks, benefits and alternatives were discussed Consent given by: patient Patient understanding: patient states understanding of the  procedure being performed Patient consent: the patient's understanding of the procedure matches consent given Site marked: the operative site was not marked Patient identity confirmed: verbally with patient and arm band Time out: Immediately prior to procedure a "time out" was called to verify the correct patient, procedure, equipment, support staff and site/side marked as required. Type: abscess Location: Left axilla. Anesthesia: local infiltration Local anesthetic: lidocaine 1% without epinephrine Anesthetic total: 8 ml Patient sedated: no Risk factors: Local bleeding. Scalpel size: 11 Needle gauge: 25. Incision type: single straight Incision depth: dermal Complexity: simple Drainage: purulent (Degenerated, fragile, sebaceous cyst wall material, removed piecemeal, along with moderate amounts of sebaceous material.) Drainage amount: moderate Wound treatment: wound left open Packing material: none Patient tolerance: Patient tolerated the procedure well with no immediate complications Comments: Findings on I&D, consistent with degenerating sebaceous cyst, with local abscess.   (including critical care time)  Review of notes from PCP, on 08/15/2015, indicate that the patient had a similar sebaceous cyst in the left axilla region, which was at that time, was superficial and nontender.    Medications  lidocaine (PF) (XYLOCAINE) 1 % injection 5 mL (not administered)  acetaminophen (TYLENOL) tablet 650 mg (650 mg Oral Given 10/24/15 0801)    Patient Vitals for the past 24 hrs:  BP Temp Temp src Pulse Resp SpO2  10/24/15 0900 147/86 mmHg - - 78 16 98 %  10/24/15 0737 164/78 mmHg - - 77 18 100 %  10/24/15 0508 172/83 mmHg 97.9 F (36.6 C) Oral 83 18 100 %         Labs Review Labs Reviewed - No data to display  Imaging Review No results found. I have personally reviewed and evaluated these images and lab results as part of my medical decision-making.   EKG  Interpretation None      MDM   Final diagnoses:  Infected sebaceous cyst    Evaluation consistent with an infected sebaceous cyst, treated with I&D, and partial removal of cyst wall material. Doubt systemic infection, significant localized infection or breast cancer.  Nursing Notes Reviewed/ Care Coordinated Applicable Imaging Reviewed Interpretation of Laboratory Data incorporated into ED treatment  The patient appears reasonably screened and/or stabilized for discharge and I doubt any other medical condition or other [facility] requiring further screening, evaluation, or treatment in the ED at this time prior to discharge.  Plan: Home Medications- Keflex; Home Treatments- warm compresses; return here if the recommended treatment, does not improve the symptoms; Recommended follow up- PCP prn     Daleen Bo, MD 10/25/15 2050

## 2015-10-24 NOTE — ED Notes (Signed)
MD at bedside. 

## 2015-10-24 NOTE — Discharge Instructions (Signed)
The cyst and infection were at least partially removed. Use a warm compress on the wound, 3-4 times a day for 30-60 minutes. This will help the wound to heal. Use Tylenol as needed for pain. We are giving you a prescription to use if you feel like the wound is infected, or not healing. Watch for increasing pain, swelling or drainage.    Sebaceous Cyst Removal Sebaceous cyst removal is a procedure to remove a sac of oily material that forms under your skin (sebaceous cyst). Sebaceous cysts may also be called epidermoid cysts or keratin cysts. Normally, the skin secretes this oily material through a gland or a hair follicle. This type of cyst usually results when a skin gland or hair follicle becomes blocked. You may need this procedure if you have a sebaceous cyst that becomes large, uncomfortable, or infected. LET [facility] CARE PROVIDER KNOW ABOUT:  Any allergies you have.  All medicines you are taking, including vitamins, herbs, eye drops, creams, and over-the-counter medicines.  Previous problems you or members of your family have had with the use of anesthetics.  Any blood disorders you have.  Previous surgeries you have had.  Medical conditions you have. RISKS AND COMPLICATIONS Generally, this is a safe procedure. However, problems may occur, including:  Developing another cyst.  Bleeding.  Infection.  Scarring. BEFORE THE PROCEDURE  Ask your health care provider about:  Changing or stopping your regular medicines. This is especially important if you are taking diabetes medicines or blood thinners.  Taking medicines such as aspirin and ibuprofen. These medicines can thin your blood. Do not take these medicines before your procedure if your health care provider instructs you not to.  If you have an infected cyst, you may have to take antibiotic medicines before or after the cyst removal. Take your antibiotics as directed by your health care provider. Finish all of the  medicine even if you start to feel better.  Take a shower on the morning of your procedure. Your health care provider may ask you to use a germ-killing (antiseptic) soap. PROCEDURE  You will be given a medicine that numbs the area (local anesthetic).  The skin around the cyst will be cleaned with a germ-killing solution (antiseptic).  Your health care provider will make a small surgical incision over the cyst.  The cyst will be separated from the surrounding tissues that are under your skin.  If possible, the cyst will be removed undamaged (intact).  If the cyst bursts (ruptures), it will need to be removed in pieces.  After the cyst is removed, your health care provider will control any bleeding and close the incision with small stitches (sutures). Small incisions may not need sutures, and the bleeding will be controlled by applying direct pressure with gauze.  Your health care provider may apply antibiotic ointment and a light bandage (dressing) over the incision. This procedure may vary among health care providers and hospitals. AFTER THE PROCEDURE  If your cyst ruptured during surgery, you may need to take antibiotic medicine. If you were prescribed an antibiotic medicine, finish all of it even if you start to feel better.   This information is not intended to replace advice given to you by your health care provider. Make sure you discuss any questions you have with your health care provider.   Document Released: 04/25/2000 Document Revised: 05/19/2014 Document Reviewed: 01/11/2014 Elsevier Interactive Patient Education [employer].

## 2015-11-01 ENCOUNTER — Ambulatory Visit (INDEPENDENT_AMBULATORY_CARE_PROVIDER_SITE_OTHER): Payer: Medicare Other | Admitting: Internal Medicine

## 2015-11-01 VITALS — BP 142/83 | HR 81 | Temp 97.6°F | Ht 61.0 in | Wt 164.6 lb

## 2015-11-01 DIAGNOSIS — L720 Epidermal cyst: Secondary | ICD-10-CM

## 2015-11-01 DIAGNOSIS — F17200 Nicotine dependence, unspecified, uncomplicated: Secondary | ICD-10-CM

## 2015-11-01 NOTE — Patient Instructions (Addendum)
Let us know if you wants to to refer you to surgery for the cyst removal.   Follow up with Dr. Daryll Drown in 1 month.

## 2015-11-01 NOTE — Assessment & Plan Note (Signed)
Epidermoid cyst on left axilla is stable currently, not inflamed or infected. She had I&D of this in the ED recently, was given abx but she did not take those. I don't think she needs abx as it does not look infected.  Gave her the option to refer to surgery for the cyst removal. Since it's not bothering her too much right now, she stated she will think about it and let us know in the future.

## 2015-11-01 NOTE — Progress Notes (Signed)
   Subjective:    Patient ID: [name], female    DOB: 1940/11/09, 75 y.o.   MRN: [id number]  HPI  75 yo F with HTN, COPD, CKD 3, tobacco abuse, presents for ED follow up for sebacceous cyst.  Went to ED on 6/14 for left axilla sore, was diagnosed with infected sebacceous cyst. Had I&D of the cyst with partial removal of the cyst wall material. Was discharged with keflex and told to do warm compresses.   Was seen by Dr. Daryll Drown on 4/5 when she did have the cyst and stated it was there for years, but she was not interested in surgical removal. Patient did not fill her abx. She feels well, not having any pain under the arm. No drainage currently. Denies any other symptoms.   Review of Systems  Constitutional: Negative for fever and chills.  Respiratory: Negative for choking and shortness of breath.   Cardiovascular: Negative for chest pain and palpitations.  Skin:       Epidermoid cyst  Neurological: Negative for dizziness and numbness.       Objective:   Physical Exam  Constitutional: She appears well-developed and well-nourished. No distress.  HENT:  Head: Normocephalic and atraumatic.  Eyes: Conjunctivae are normal.  Cardiovascular: Normal rate and regular rhythm.  Exam reveals no gallop and no friction rub.   No murmur heard. Pulmonary/Chest: Effort normal and breath sounds normal. No respiratory distress. She has no wheezes.  Skin: She is not diaphoretic.  Has a circular area of induration ~1cm in diameter with central punctum. No drainage, tenderness, or erythema.      Filed Vitals:   11/01/15 1037 11/01/15 1039  BP:  142/83  Pulse:  81  Temp: 97.6 F (36.4 C)         Assessment & Plan:  See problem based a&p.

## 2015-11-04 NOTE — Progress Notes (Signed)
Medicine attending: Medical history, presenting problems, physical findings, and medications, reviewed with resident physician Dr Tasrif Ahmed on the day of the patient visit and I concur with his evaluation and management plan. 

## 2015-11-20 DIAGNOSIS — I739 Peripheral vascular disease, unspecified: Secondary | ICD-10-CM | POA: Diagnosis not present

## 2015-11-20 DIAGNOSIS — L84 Corns and callosities: Secondary | ICD-10-CM | POA: Diagnosis not present

## 2015-11-20 DIAGNOSIS — L600 Ingrowing nail: Secondary | ICD-10-CM | POA: Diagnosis not present

## 2015-11-20 DIAGNOSIS — M79675 Pain in left toe(s): Secondary | ICD-10-CM | POA: Diagnosis not present

## 2015-12-10 DIAGNOSIS — B079 Viral wart, unspecified: Secondary | ICD-10-CM | POA: Diagnosis not present

## 2015-12-10 DIAGNOSIS — M79675 Pain in left toe(s): Secondary | ICD-10-CM | POA: Diagnosis not present

## 2015-12-10 DIAGNOSIS — M79674 Pain in right toe(s): Secondary | ICD-10-CM | POA: Diagnosis not present

## 2016-01-18 DIAGNOSIS — M79674 Pain in right toe(s): Secondary | ICD-10-CM | POA: Diagnosis not present

## 2016-01-18 DIAGNOSIS — M79675 Pain in left toe(s): Secondary | ICD-10-CM | POA: Diagnosis not present

## 2016-01-18 DIAGNOSIS — B079 Viral wart, unspecified: Secondary | ICD-10-CM | POA: Diagnosis not present

## 2016-01-25 DIAGNOSIS — H9113 Presbycusis, bilateral: Secondary | ICD-10-CM | POA: Diagnosis not present

## 2016-01-25 DIAGNOSIS — H903 Sensorineural hearing loss, bilateral: Secondary | ICD-10-CM | POA: Diagnosis not present

## 2016-01-31 ENCOUNTER — Ambulatory Visit (INDEPENDENT_AMBULATORY_CARE_PROVIDER_SITE_OTHER): Payer: Medicare Other | Admitting: Podiatry

## 2016-01-31 ENCOUNTER — Ambulatory Visit (INDEPENDENT_AMBULATORY_CARE_PROVIDER_SITE_OTHER): Payer: Medicare Other

## 2016-01-31 VITALS — BP 120/79 | HR 77 | Resp 16 | Ht 64.0 in | Wt 176.0 lb

## 2016-01-31 DIAGNOSIS — I739 Peripheral vascular disease, unspecified: Secondary | ICD-10-CM | POA: Diagnosis not present

## 2016-01-31 DIAGNOSIS — Q828 Other specified congenital malformations of skin: Secondary | ICD-10-CM

## 2016-01-31 DIAGNOSIS — M79676 Pain in unspecified toe(s): Secondary | ICD-10-CM

## 2016-01-31 DIAGNOSIS — M79672 Pain in left foot: Secondary | ICD-10-CM | POA: Diagnosis not present

## 2016-01-31 DIAGNOSIS — M2011 Hallux valgus (acquired), right foot: Secondary | ICD-10-CM | POA: Diagnosis not present

## 2016-01-31 DIAGNOSIS — B351 Tinea unguium: Secondary | ICD-10-CM

## 2016-01-31 DIAGNOSIS — M79671 Pain in right foot: Secondary | ICD-10-CM

## 2016-01-31 NOTE — Progress Notes (Signed)
   Subjective:    Patient ID: [name], female    DOB: 05/07/1941, 75 y.o.   MRN: [id number]  HPI Chief Complaint  Patient presents with  . Foot Pain    bilateral with the R being the most painful at great toe and ball of foot.   Previous bunion surgeries bilateral and pt states it still really hurts.  Pt states it's hurt to walk.     She states that she had this previous surgery performed many years ago she doesn't recall that day at this point. She has now started to experience pain beneath the third metatarsal as well as the dorsal medial aspect of the hallux interphalangeal joint and the medial aspect of the tibial border of the nail.   Review of Systems  All other systems reviewed and are negative.      Objective:   Physical Exam: I reviewed her past history medications and allergies. Pulses are nonpalpable bilateral and feet are cool to touch. There do not appear to be cyanotic however capillary fill time is delayed. Neurologic sensorium is intact deep tendon reflexes are intact muscle strength normal. Orthopedic evaluation demonstrates severe foot deformity right. Status post [facility] bunion repair second and fifth metatarsal head resections resulting in transfer lesions sub-fourth right and a hallux interphalangeal with hallux malleus in a valgus position right foot. Radiographs confirm this with osteopenia and internal fixation is intact. No signs of bone infection. Cutaneous evaluation reactive hyperkeratosis and painful elongated toenails.        Assessment & Plan:  Peripheral vascular disease and painful elongated toenails with digital deformities. Porokeratosis.  Plan: For sending her for arterial studies. I debrided toenails 1 through 5 bilateral. Debrided all reactive hyperkeratosis in place padding. Discussed need for surgical intervention regarding her right foot.

## 2016-02-08 DIAGNOSIS — H5500 Unspecified nystagmus: Secondary | ICD-10-CM | POA: Diagnosis not present

## 2016-02-08 DIAGNOSIS — Z961 Presence of intraocular lens: Secondary | ICD-10-CM | POA: Diagnosis not present

## 2016-02-08 DIAGNOSIS — Z947 Corneal transplant status: Secondary | ICD-10-CM | POA: Diagnosis not present

## 2016-02-20 ENCOUNTER — Ambulatory Visit (INDEPENDENT_AMBULATORY_CARE_PROVIDER_SITE_OTHER): Payer: Medicare Other | Admitting: Cardiovascular Disease

## 2016-02-20 ENCOUNTER — Encounter: Payer: Self-pay | Admitting: Cardiovascular Disease

## 2016-02-20 VITALS — BP 98/70 | HR 73 | Ht 64.0 in

## 2016-02-20 DIAGNOSIS — F17200 Nicotine dependence, unspecified, uncomplicated: Secondary | ICD-10-CM

## 2016-02-20 DIAGNOSIS — M79673 Pain in unspecified foot: Secondary | ICD-10-CM

## 2016-02-20 DIAGNOSIS — E7800 Pure hypercholesterolemia, unspecified: Secondary | ICD-10-CM | POA: Diagnosis not present

## 2016-02-20 DIAGNOSIS — M79671 Pain in right foot: Secondary | ICD-10-CM

## 2016-02-20 DIAGNOSIS — M79672 Pain in left foot: Secondary | ICD-10-CM

## 2016-02-20 DIAGNOSIS — I10 Essential (primary) hypertension: Secondary | ICD-10-CM

## 2016-02-20 NOTE — Patient Instructions (Signed)
Medication Instructions:  NO CHANGES.   Testing/Procedures: Your physician has requested that you have a lower extremity arterial doppler- During this test, ultrasound is used to evaluate arterial blood flow in the legs. Allow approximately one hour for this exam.    Follow-Up: Your physician recommends that you schedule a follow-up appointment in: AS NEEDED.   If you need a refill on your cardiac medications before your next appointment, please call your pharmacy.

## 2016-02-20 NOTE — Progress Notes (Signed)
02/20/2016 [name]   1940/09/27  [id number]  Primary Physician Gilles Chiquito, MD Primary Cardiologist: Lorretta Harp MD Renae Gloss  HPI:  [name] was referred to me by Dr. Milinda Pointer, her podiatrist, for peripheral vascular evaluation and preoperative clearance. She has a history of treated hypertension and long-standing tobacco abuse. She is not diabetic. She apparently has lipidemia but not on statin therapy. She has never had a heart attack or stroke. She's had prior foot surgeries in the past and has bilateral foot pain right greater than left making it difficult to walk. She saw her podiatrist wants to perform a surgical procedure and she was referred here for vascular clearance. She does have palpable pedal pulses in her right foot.   Current Outpatient Prescriptions  Medication Sig Dispense Refill  . albuterol (PROVENTIL HFA;VENTOLIN HFA) 108 (90 Base) MCG/ACT inhaler Inhale 2 puffs into the lungs every 6 (six) hours as needed for wheezing or shortness of breath. 1 Inhaler 6  . hydrochlorothiazide (MICROZIDE) 12.5 MG capsule Take 1 capsule (12.5 mg total) by mouth daily. 30 capsule 6  . Vitamin D, Cholecalciferol, 1000 units CAPS Take 1 capsule by mouth daily. 90 capsule 3   No current facility-administered medications for this visit.     Allergies  Allergen Reactions  . Codeine     REACTION: Pt c/o "feeling high"  . Morphine     REACTION: feels funny and "high"    Social History   Social History  . Marital status: Divorced    Spouse name: N/A  . Number of children: N/A  . Years of education: N/A   Occupational History  . Not on file.   Social History Main Topics  . Smoking status: Current Every Day Smoker    Packs/day: 0.50    Years: 20.00    Types: Cigarettes  . Smokeless tobacco: Never Used     Comment: .  Marland Kitchen Alcohol use No  . Drug use: No  . Sexual activity: Not on file   Other Topics Concern  . Not on file   Social History  Narrative   Patient currently lives at home alone, but does have a home worker who visits her throughout the week to help with things around the house.  Patient states that she still tries to remain active and walks daily.  She is has been divorced for sometime and has one son.  She currently lives off social security.  Patient reports using approximately half a pack of cigarettes daily for 20 years.  She denies any alcohol or illicit drug use.  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       Review of Systems: General: negative for chills, fever, night sweats or weight changes.  Cardiovascular: negative for chest pain, dyspnea on exertion, edema, orthopnea, palpitations, paroxysmal nocturnal dyspnea or shortness of breath Dermatological: negative for rash Respiratory: negative for cough or wheezing Urologic: negative for hematuria Abdominal: negative for nausea, vomiting, diarrhea, bright red blood per rectum, melena, or hematemesis Neurologic: negative for visual changes, syncope, or dizziness All other systems reviewed and are otherwise negative except as noted above.    Blood pressure 98/70, pulse 73, height '5\' 4"'$  (1.626 m).  General appearance: alert and no distress Neck: no adenopathy, no carotid bruit, no JVD, supple, symmetrical, trachea midline and thyroid not enlarged, symmetric, no tenderness/mass/nodules Lungs: clear to auscultation bilaterally Heart: regular rate and  rhythm, S1, S2 normal, no murmur, click, rub or gallop Extremities: extremities normal, atraumatic, no cyanosis or edema  EKG normal sinus rhythm at 73 with ST or T-wave changes. I personally reviewed this EKG  ASSESSMENT AND PLAN:   Hyperlipidemia History of hyperlipidemia not on statin therapy followed by her PCP  TOBACCO ABUSE History of ongoing tobacco abuse of approximately one half pack per day for last 40 years recalcitrant to risk factor  modification  Essential hypertension History of hypertension blood pressure measured 98/70. She is on hydrochlorothiazide. Continue current meds at current dosing  Bilateral foot pain [name] was referred to me by Dr. Milinda Pointer for evaluation of bilateral foot pain with consideration of foot surgery for preoperative peripheral vascular clearance. She does have palpable pedal pulses. I'm going to get lower extremity arterial Doppler studies to determine whether or not she's got adequate perfusion to heal a surgical foot wound. I will see her back when necessary.      Lorretta Harp MD FACP,FACC,FAHA, [facility] 02/20/2016 11:47 AM

## 2016-02-20 NOTE — Assessment & Plan Note (Signed)
[name] was referred to me by Dr. Milinda Pointer for evaluation of bilateral foot pain with consideration of foot surgery for preoperative peripheral vascular clearance. She does have palpable pedal pulses. I'm going to get lower extremity arterial Doppler studies to determine whether or not she's got adequate perfusion to heal a surgical foot wound. I will see her back when necessary.

## 2016-02-20 NOTE — Assessment & Plan Note (Signed)
History of hypertension blood pressure measured 98/70. She is on hydrochlorothiazide. Continue current meds at current dosing

## 2016-02-20 NOTE — Assessment & Plan Note (Signed)
History of hyperlipidemia not on statin therapy followed by her PCP 

## 2016-02-20 NOTE — Assessment & Plan Note (Addendum)
History of ongoing tobacco abuse of approximately one half pack per day for last 40 years recalcitrant to risk factor modification

## 2016-02-21 ENCOUNTER — Ambulatory Visit: Payer: Medicare Other | Admitting: Cardiovascular Disease

## 2016-02-25 ENCOUNTER — Other Ambulatory Visit: Payer: Self-pay | Admitting: Podiatry

## 2016-02-25 ENCOUNTER — Ambulatory Visit (HOSPITAL_COMMUNITY)
Admission: RE | Admit: 2016-02-25 | Discharge: 2016-02-25 | Disposition: A | Discharge status: Home / Self Care | Payer: Medicare Other | Source: Ambulatory Visit | Attending: Internal Medicine | Admitting: Internal Medicine

## 2016-02-25 DIAGNOSIS — I10 Essential (primary) hypertension: Secondary | ICD-10-CM | POA: Insufficient documentation

## 2016-02-25 DIAGNOSIS — R0989 Other specified symptoms and signs involving the circulatory and respiratory systems: Secondary | ICD-10-CM

## 2016-02-25 DIAGNOSIS — M79673 Pain in unspecified foot: Secondary | ICD-10-CM | POA: Diagnosis present

## 2016-02-25 DIAGNOSIS — I739 Peripheral vascular disease, unspecified: Secondary | ICD-10-CM | POA: Diagnosis not present

## 2016-02-25 DIAGNOSIS — R9439 Abnormal result of other cardiovascular function study: Secondary | ICD-10-CM | POA: Insufficient documentation

## 2016-02-25 DIAGNOSIS — I743 Embolism and thrombosis of arteries of the lower extremities: Secondary | ICD-10-CM | POA: Insufficient documentation

## 2016-02-29 ENCOUNTER — Other Ambulatory Visit: Payer: Self-pay | Admitting: *Deleted

## 2016-02-29 ENCOUNTER — Encounter: Payer: Self-pay | Admitting: Cardiovascular Disease

## 2016-02-29 ENCOUNTER — Ambulatory Visit (INDEPENDENT_AMBULATORY_CARE_PROVIDER_SITE_OTHER): Payer: Medicare Other | Admitting: Cardiovascular Disease

## 2016-02-29 VITALS — BP 126/78 | HR 68 | Ht 64.0 in | Wt 156.6 lb

## 2016-02-29 DIAGNOSIS — M79671 Pain in right foot: Secondary | ICD-10-CM

## 2016-02-29 DIAGNOSIS — I70229 Atherosclerosis of native arteries of extremities with rest pain, unspecified extremity: Secondary | ICD-10-CM | POA: Diagnosis not present

## 2016-02-29 DIAGNOSIS — Z01810 Encounter for preprocedural cardiovascular examination: Secondary | ICD-10-CM | POA: Diagnosis not present

## 2016-02-29 DIAGNOSIS — M79672 Pain in left foot: Principal | ICD-10-CM

## 2016-02-29 DIAGNOSIS — Z01818 Encounter for other preprocedural examination: Secondary | ICD-10-CM | POA: Diagnosis not present

## 2016-02-29 NOTE — Assessment & Plan Note (Signed)
Mrs. Alesi was referred to me by Dr. Milinda Pointer for bilateral foot pain and for peripheral vascular evaluation prior to potential surgery. She had Doppler studies performed in our office 02/25/16 revealing a right ABI 0.66 LF 0.53. She had high frequency signals in both SFAs as well as tibial vessel disease. We'll plan on performing peripheral angiography and potential intervention on her right leg initially since it is most affected extremity with staged intervention on her left leg.

## 2016-02-29 NOTE — Progress Notes (Signed)
[name] was referred to me by Dr. Milinda Pointer for bilateral foot pain and for peripheral vascular evaluation prior to potential surgery. She had Doppler studies performed in our office 02/25/16 revealing a right ABI 0.66 LF 0.53. She had high frequency signals in both SFAs as well as tibial vessel disease. We'll plan on performing peripheral angiography and potential intervention on her right leg initially since it is most affected extremity with staged intervention on her left leg.

## 2016-02-29 NOTE — Patient Instructions (Signed)
Medication Instructions:  Continue current medications.   Testing/Procedures: [name] has ordered a peripheral angiogram to be done at [facility].  This procedure is going to look at the bloodflow in your lower extremities.  If [name] is able to open up the arteries, you will have to spend one night in the hospital.  If he is not able to open the arteries, you will be able to go home that same day.    After the procedure, you will not be allowed to drive for 3 days or push, pull, or lift anything greater than 10 lbs for one week.    You will be required to have the following tests prior to the procedure:  1. Blood work-the blood work can be done no more than 14 days prior to the procedure.  It can be done at any [facility] lab.  There is one downstairs on the first floor of this building and one in the [facility] building [phone] [address], [address])  2. Chest Xray-the chest xray order has already been placed at the [facility].     *REPS  SCOTT  Puncture site LEFT GROIN    If you need a refill on your cardiac medications before your next appointment, please call your pharmacy.

## 2016-03-06 ENCOUNTER — Ambulatory Visit
Admission: RE | Admit: 2016-03-06 | Discharge: 2016-03-06 | Disposition: A | Discharge status: Home / Self Care | Payer: Medicare Other | Source: Ambulatory Visit | Attending: Cardiovascular Disease | Admitting: Cardiovascular Disease

## 2016-03-06 DIAGNOSIS — I70229 Atherosclerosis of native arteries of extremities with rest pain, unspecified extremity: Secondary | ICD-10-CM

## 2016-03-06 DIAGNOSIS — Z01810 Encounter for preprocedural cardiovascular examination: Secondary | ICD-10-CM

## 2016-03-06 DIAGNOSIS — Z01818 Encounter for other preprocedural examination: Secondary | ICD-10-CM

## 2016-03-06 DIAGNOSIS — R918 Other nonspecific abnormal finding of lung field: Secondary | ICD-10-CM | POA: Diagnosis not present

## 2016-03-12 DIAGNOSIS — I70229 Atherosclerosis of native arteries of extremities with rest pain, unspecified extremity: Secondary | ICD-10-CM | POA: Diagnosis not present

## 2016-03-12 DIAGNOSIS — Z01810 Encounter for preprocedural cardiovascular examination: Secondary | ICD-10-CM | POA: Diagnosis not present

## 2016-03-12 DIAGNOSIS — Z01818 Encounter for other preprocedural examination: Secondary | ICD-10-CM | POA: Diagnosis not present

## 2016-03-12 LAB — CBC WITH DIFFERENTIAL/PLATELET
BASOS PCT: 2 %
Basophils Absolute: 130 cells/uL (ref 0–200)
EOS ABS: 195 {cells}/uL (ref 15–500)
EOS PCT: 3 %
HCT: 44.8 % (ref 35.0–45.0)
Hemoglobin: 14.7 g/dL (ref 11.7–15.5)
LYMPHS PCT: 28 %
Lymphs Abs: 1820 cells/uL (ref 850–3900)
MCH: 31.9 pg (ref 27.0–33.0)
MCHC: 32.8 g/dL (ref 32.0–36.0)
MCV: 97.2 fL (ref 80.0–100.0)
MONOS PCT: 11 %
MPV: 10.8 fL (ref 7.5–12.5)
Monocytes Absolute: 715 cells/uL (ref 200–950)
NEUTROS ABS: 3640 {cells}/uL (ref 1500–7800)
Neutrophils Relative %: 56 %
PLATELETS: 217 10*3/uL (ref 140–400)
RBC: 4.61 MIL/uL (ref 3.80–5.10)
RDW: 13.7 % (ref 11.0–15.0)
WBC: 6.5 10*3/uL (ref 3.8–10.8)

## 2016-03-12 LAB — BASIC METABOLIC PANEL
BUN: 30 mg/dL — ABNORMAL HIGH (ref 7–25)
CALCIUM: 10 mg/dL (ref 8.6–10.4)
CHLORIDE: 102 mmol/L (ref 98–110)
CO2: 27 mmol/L (ref 20–31)
CREATININE: 1.8 mg/dL — AB (ref 0.60–0.93)
Glucose, Bld: 92 mg/dL (ref 65–99)
Potassium: 4.6 mmol/L (ref 3.5–5.3)
SODIUM: 140 mmol/L (ref 135–146)

## 2016-03-12 LAB — APTT: aPTT: 36 s — ABNORMAL HIGH (ref 22–34)

## 2016-03-12 LAB — PROTIME-INR
INR: 1
PROTHROMBIN TIME: 10.4 s (ref 9.0–11.5)

## 2016-03-13 ENCOUNTER — Telehealth: Payer: Self-pay | Admitting: *Deleted

## 2016-03-13 NOTE — Telephone Encounter (Signed)
Got in touch with patient. She verbalized understanding that someone will call her Sunday morning to let her know when to come in. She has someone that should be available to bring her. She is aware to stop taking her hydrochlorothiazide Friday, Saturday and Sunday.

## 2016-03-13 NOTE — Progress Notes (Signed)
Needs to be admitted the night before for hydration.  Hold HCTZ now.  Discussed with [name].

## 2016-03-13 NOTE — Telephone Encounter (Signed)
Minus Breeding, MD  Vanessa Ralphs, RN        See my note. Needs to stop HCTZ and be admitted to the hospital Sunday for hydration.     Spoke with patient and gave her lab results along with instruction to come in for hydration on Sunday. Patient was frustrated and seemed to not comprehend the plan at first. She then said she is legally blind and depends [place] [employer] For transportation and you have to make 24 hour prior to reservation for [facility].  Asked if there was a friend or family member she or I could call to ask for help with ride. She said her 2 sons are not dependable. She was reluctant and refused to give me any information it seemed.  Called and notified [facility] Control about patient for preadmit. Asked if we could have an exact time and unable to do so. Patient put down for morning admission and that it may take her a while to arrive due to transportation issues.

## 2016-03-13 NOTE — Telephone Encounter (Signed)
Attempted to call patient twice. Left message with patient that someone will call her Sunday morning to let her know when to come in to [facility]. I also suggested if the person who brought her to her office visit appt could help. Let her know to not take her hydrochlorothiazide today(if not already taken it), tomorrow, Saturday, Sunday or Monday leading up to the procedure. Asked for her to please call back to discuss details and help figure out transportation.

## 2016-03-16 ENCOUNTER — Encounter (HOSPITAL_COMMUNITY): Payer: Self-pay

## 2016-03-16 ENCOUNTER — Observation Stay (HOSPITAL_COMMUNITY)
Admission: RE | Admit: 2016-03-16 | Discharge: 2016-03-18 | Disposition: A | Discharge status: Home / Self Care | Payer: Medicare Other | Source: Ambulatory Visit | Attending: Cardiovascular Disease | Admitting: Cardiovascular Disease

## 2016-03-16 DIAGNOSIS — E785 Hyperlipidemia, unspecified: Secondary | ICD-10-CM | POA: Insufficient documentation

## 2016-03-16 DIAGNOSIS — I739 Peripheral vascular disease, unspecified: Secondary | ICD-10-CM

## 2016-03-16 DIAGNOSIS — F17210 Nicotine dependence, cigarettes, uncomplicated: Secondary | ICD-10-CM | POA: Insufficient documentation

## 2016-03-16 DIAGNOSIS — M79672 Pain in left foot: Secondary | ICD-10-CM | POA: Diagnosis present

## 2016-03-16 DIAGNOSIS — I7092 Chronic total occlusion of artery of the extremities: Secondary | ICD-10-CM | POA: Diagnosis not present

## 2016-03-16 DIAGNOSIS — I70213 Atherosclerosis of native arteries of extremities with intermittent claudication, bilateral legs: Secondary | ICD-10-CM | POA: Diagnosis not present

## 2016-03-16 DIAGNOSIS — N183 Chronic kidney disease, stage 3 (moderate): Secondary | ICD-10-CM | POA: Diagnosis not present

## 2016-03-16 DIAGNOSIS — I129 Hypertensive chronic kidney disease with stage 1 through stage 4 chronic kidney disease, or unspecified chronic kidney disease: Secondary | ICD-10-CM | POA: Insufficient documentation

## 2016-03-16 DIAGNOSIS — M79671 Pain in right foot: Secondary | ICD-10-CM | POA: Diagnosis present

## 2016-03-16 DIAGNOSIS — Z79899 Other long term (current) drug therapy: Secondary | ICD-10-CM | POA: Insufficient documentation

## 2016-03-16 DIAGNOSIS — Z947 Corneal transplant status: Secondary | ICD-10-CM | POA: Diagnosis not present

## 2016-03-16 DIAGNOSIS — J449 Chronic obstructive pulmonary disease, unspecified: Secondary | ICD-10-CM | POA: Diagnosis not present

## 2016-03-16 DIAGNOSIS — Z86718 Personal history of other venous thrombosis and embolism: Secondary | ICD-10-CM | POA: Diagnosis not present

## 2016-03-16 LAB — CREATININE, SERUM
Creatinine, Ser: 1.62 mg/dL — ABNORMAL HIGH (ref 0.44–1.00)
GFR, EST AFRICAN AMERICAN: 35 mL/min — AB (ref >60)
GFR, EST NON AFRICAN AMERICAN: 30 mL/min — AB (ref >60)

## 2016-03-16 LAB — CBC
HEMATOCRIT: 38.8 % (ref 36.0–46.0)
HEMOGLOBIN: 12.6 g/dL (ref 12.0–15.0)
MCH: 31.3 pg (ref 26.0–34.0)
MCHC: 32.5 g/dL (ref 30.0–36.0)
MCV: 96.5 fL (ref 78.0–100.0)
Platelets: 184 10*3/uL (ref 150–400)
RBC: 4.02 MIL/uL (ref 3.87–5.11)
RDW: 13.7 % (ref 11.5–15.5)
WBC: 7.7 10*3/uL (ref 4.0–10.5)

## 2016-03-16 MED ORDER — SODIUM CHLORIDE 0.9% FLUSH
3.0000 mL | Freq: Two times a day (BID) | INTRAVENOUS | Status: DC
  Administered 2016-03-16: 3 mL via INTRAVENOUS

## 2016-03-16 MED ORDER — ACETAMINOPHEN 325 MG PO TABS
650.0000 mg | ORAL_TABLET | ORAL | Status: DC | PRN
  Administered 2016-03-16 – 2016-03-17 (×3): 650 mg via ORAL
  Filled 2016-03-16 (×3): qty 2

## 2016-03-16 MED ORDER — TRAMADOL HCL 50 MG PO TABS
50.0000 mg | ORAL_TABLET | Freq: Once | ORAL | Status: AC
  Administered 2016-03-16: 50 mg via ORAL
  Filled 2016-03-16: qty 1

## 2016-03-16 MED ORDER — ONDANSETRON HCL 4 MG/2ML IJ SOLN: 4.0000 mg | Freq: Four times a day (QID) | INTRAMUSCULAR | Status: DC | PRN

## 2016-03-16 MED ORDER — NICOTINE 14 MG/24HR TD PT24
14.0000 mg | MEDICATED_PATCH | Freq: Every day | TRANSDERMAL | Status: DC
  Administered 2016-03-16 – 2016-03-18 (×3): 14 mg via TRANSDERMAL
  Filled 2016-03-16 (×2): qty 1

## 2016-03-16 MED ORDER — NICOTINE 14 MG/24HR TD PT24
14.0000 mg | MEDICATED_PATCH | Freq: Every day | TRANSDERMAL | Status: DC
  Filled 2016-03-16: qty 1

## 2016-03-16 MED ORDER — ALBUTEROL SULFATE (2.5 MG/3ML) 0.083% IN NEBU: 2.5000 mg | INHALATION_SOLUTION | Freq: Four times a day (QID) | RESPIRATORY_TRACT | Status: DC | PRN

## 2016-03-16 MED ORDER — NITROGLYCERIN 0.4 MG SL SUBL
0.4000 mg | SUBLINGUAL_TABLET | SUBLINGUAL | Status: DC | PRN
Start: 2016-03-16 — End: 2016-03-17

## 2016-03-16 MED ORDER — SODIUM CHLORIDE 0.9 % IV SOLN: 250.0000 mL | INTRAVENOUS | Status: DC | PRN

## 2016-03-16 MED ORDER — ALBUTEROL SULFATE (2.5 MG/3ML) 0.083% IN NEBU
2.5000 mg | INHALATION_SOLUTION | Freq: Once | RESPIRATORY_TRACT | Status: AC
  Administered 2016-03-16: 2.5 mg via RESPIRATORY_TRACT
  Filled 2016-03-16: qty 3

## 2016-03-16 MED ORDER — HEPARIN SODIUM (PORCINE) 5000 UNIT/ML IJ SOLN
5000.0000 [IU] | Freq: Three times a day (TID) | INTRAMUSCULAR | Status: DC
  Administered 2016-03-16 – 2016-03-17 (×3): 5000 [IU] via SUBCUTANEOUS
  Filled 2016-03-16 (×4): qty 1

## 2016-03-16 MED ORDER — SODIUM CHLORIDE 0.9% FLUSH: 3.0000 mL | INTRAVENOUS | Status: DC | PRN

## 2016-03-16 MED ORDER — SODIUM CHLORIDE 0.9 % IV SOLN
INTRAVENOUS | Status: DC
  Administered 2016-03-16 – 2016-03-17 (×3): via INTRAVENOUS

## 2016-03-16 MED ORDER — VITAMIN D 1000 UNITS PO TABS
1000.0000 [IU] | ORAL_TABLET | Freq: Every day | ORAL | Status: DC
Start: 2016-03-16 — End: 2016-03-17
  Administered 2016-03-16 – 2016-03-17 (×2): 1000 [IU] via ORAL
  Filled 2016-03-16 (×2): qty 1

## 2016-03-16 NOTE — H&P (Signed)
Patient ID: [name] MRN: [id number], DOB/AGE: 76-Jul-1942   Admit date: 03/16/2016   Primary Physician: Gilles Chiquito, MD Primary Cardiologist: [name]  Pt. Profile:  [name] is a 75 y.o. female with a history of  HTN, ongoing tobacco abuse, COPD, hyperlipidemia (not on statin) , CKD stage III  and bilateral foot pain who presented for hydration prior to PV procedure.   She is not diabetic. She apparently has lipidemia but not on statin therapy. She has never had a heart attack or stroke. She's had prior foot surgeries in the past and has bilateral foot pain right greater than left making it difficult to walk. She saw her podiatrist wants to perform a surgical procedure and she was referred here for vascular clearance to Dr.Berry. She had Doppler studies performed in our office 02/25/16 revealing a right ABI 0.66 LF 0.53. She had high frequency signals in both SFAs as well as tibial vessel disease. Scr was elevated. She been holding her HCTZ for past three days. Here for  Hydration.   The patient denies nausea, vomiting, fever, chest pain, palpitations, shortness of breath, orthopnea, PND, dizziness, syncope, cough, congestion, abdominal pain, hematochezia, melena, lower extremity edema.   HPI: As above.   Problem List  Past Medical History:  Diagnosis Date  . Arthritis   . Blind    s/p bilateral corneal transplant - sees well now  . CKD (chronic kidney disease) stage 3, GFR 30-59 ml/min   . DVT (deep venous thrombosis) ([place])   . HEARING LOSS   . Hypertension   . Phlebitis     Past Surgical History:  Procedure Laterality Date  . EYE SURGERY    . FOOT SURGERY    . right wrist    . SHOULDER OPEN ROTATOR CUFF REPAIR  1973   right shoulder, secondary numbness in R hand     Allergies  Allergies  Allergen Reactions  . Codeine Other (See Comments)    REACTION: Pt c/o "feeling high"  . Morphine Other (See Comments)    REACTION: feels funny and "high"      Home Medications  Prior to Admission medications   Medication Sig Start Date End Date Taking? Authorizing Provider  acetaminophen (TYLENOL) 500 MG tablet Take 500 mg by mouth every 6 (six) hours as needed (for pain/foot pain.).   Yes Historical Provider, MD  hydrochlorothiazide (MICROZIDE) 12.5 MG capsule Take 1 capsule (12.5 mg total) by mouth daily. 08/15/15  Yes Sid Falcon, MD  ibuprofen (ADVIL,MOTRIN) 200 MG tablet Take 200 mg by mouth every 8 (eight) hours as needed (for pain/foot pain.).   Yes Historical Provider, MD  prednisoLONE acetate (PRED FORTE) 1 % ophthalmic suspension Place 1 drop into both eyes daily.   Yes Historical Provider, MD  albuterol (PROVENTIL HFA;VENTOLIN HFA) 108 (90 Base) MCG/ACT inhaler Inhale 2 puffs into the lungs every 6 (six) hours as needed for wheezing or shortness of breath. 08/15/15   Sid Falcon, MD  Vitamin D, Cholecalciferol, 1000 units CAPS Take 1 capsule by mouth daily. Patient not taking: Reported on 03/11/2016 08/15/15   Sid Falcon, MD    Family History  Family History  Problem Relation Age of Onset  . Cancer Maternal Grandmother     liver  . Cancer Maternal Uncle     colon  . Cancer Mother     liver  . Cancer Brother     Unknown  . Cancer Sister  Unknown   Family Status  Relation Status  . Maternal Grandmother Deceased  . Maternal Uncle   . Mother   . Brother   . Sister     Social History  Social History   Social History  . Marital status: Divorced    Spouse name: N/A  . Number of children: N/A  . Years of education: N/A   Occupational History  . Not on file.   Social History Main Topics  . Smoking status: Current Every Day Smoker    Packs/day: 0.50    Years: 20.00    Types: Cigarettes  . Smokeless tobacco: Never Used     Comment: .  Marland Kitchen Alcohol use No  . Drug use: No  . Sexual activity: Not on file   Other Topics Concern  . Not on file   Social History Narrative   Patient currently lives at  home alone, but does have a home worker who visits her throughout the week to help with things around the house.  Patient states that she still tries to remain active and walks daily.  She is has been divorced for sometime and has one son.  She currently lives off social security.  Patient reports using approximately half a pack of cigarettes daily for 20 years.  She denies any alcohol or illicit drug use.  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       Review of Systems General:  No chills, fever, night sweats or weight changes.  Cardiovascular:  No chest pain, dyspnea on exertion, edema, orthopnea, palpitations, paroxysmal nocturnal dyspnea. Dermatological: No rash, lesions/masses Respiratory: No cough, dyspnea Urologic: No hematuria, dysuria Abdominal:   No nausea, vomiting, diarrhea, bright red blood per rectum, melena, or hematemesis Neurologic:  No visual changes, wkns, changes in mental status. All other systems reviewed and are otherwise negative except as noted above.  Physical Exam  Blood pressure 134/70, pulse 83, temperature 97.7 F (36.5 C), temperature source Oral, resp. rate 20, height '5\' 4"'$  (1.626 m), weight 156 lb 8.4 oz (71 kg), SpO2 100 %.  General: Pleasant, NAD Psych: Normal affect. Neuro: Alert and oriented X 3. Moves all extremities spontaneously. HEENT: Normal  Neck: Supple without bruits or JVD. Lungs:  Resp regular and unlabored,diffuse scatted wheezing.  Heart: RRR no s3, s4, or murmurs. Abdomen: Soft, non-tender, non-distended, BS + x 4.  Extremities: No clubbing, cyanosis or edema. DP/PT/Radials 2+ and equal bilaterally.  Labs  No results for input(s): CKTOTAL, CKMB, TROPONINI in the last 72 hours. Lab Results  Component Value Date   WBC 6.5 03/12/2016   HGB 14.7 03/12/2016   HCT 44.8 03/12/2016   MCV 97.2 03/12/2016   PLT 217 03/12/2016    Recent Labs Lab 03/12/16 0917  NA 140  K  4.6  CL 102  CO2 27  BUN 30*  CREATININE 1.80*  CALCIUM 10.0  GLUCOSE 92   Lab Results  Component Value Date   CHOL 190 08/15/2011   HDL 66 08/15/2011   LDLCALC 108 (H) 08/15/2011   TRIG 80 08/15/2011   No results found for: DDIMER   Radiology/Studies  Dg Chest 2 View  Result Date: 03/06/2016 CLINICAL DATA:  Preop for cardiac posterior EXAM: CHEST  2 VIEW COMPARISON:  9/30/ 16 FINDINGS: Cardiomediastinal silhouette is stable. Mild hyperinflation. There is reticular mild interstitial prominence bilateral probable chronic in nature. No superimposed infiltrate or pulmonary edema. IMPRESSION: Mild hyperinflation. Probable chronic  reticular mild interstitial prominence. No superimposed infiltrate or pulmonary edema. Electronically Signed   By: Lahoma Crocker M.D.   On: 03/06/2016 09:55    ECG  02/20/16 NSR at rate of 71 bpm, PACs  ASSESSMENT AND PLAN   1. Bilateral foot pain  - She had Doppler studies performed in our office 02/25/16 revealing a right ABI 0.66 LF 0.53. She had high frequency signals in both SFAs as well as tibial vessel disease. Scr was elevated. She been holding her HCTZ for past three days. Will start IV hydration at rate of 92m/hr.   2. HTN - HCTZ on hold x 3 days. BP stable.  3. Wheezing - She has COPD. She denies fever, chills, SOB or dyspnea. Will give nebulizer treatment x 1. Not concerning for CODP exacerbation. PRN inhaler.  4. Tobacco smoking - Advised cessation.   5. HLD - Not on statin. Managed by PCP.   Signed, BLeanor Kail PA-C 03/16/2016, 11:46 AM Pager [phone]  As above, patient seen and examined. Briefly she is a 75year old female with past medical history of hypertension, COPD, hyperlipidemia, tobacco abuse, peripheral vascular disease, chronic stage III kidney disease admitted for IV hydration prior to peripheral arteriogram tomorrow. She denies chest pain or dyspnea. She describes bilateral lower extremity pain. Creatinine is  1.80. Begin IV hydration tonight. Follow creatinine closely after procedure. Hold hydrochlorothiazide prior to procedure. She will need treatment with aspirin and statin long-term. Discontinue tobacco abuse. BKirk Ruths MD

## 2016-03-17 ENCOUNTER — Encounter (HOSPITAL_COMMUNITY)
Admission: RE | Disposition: A | Discharge status: Home / Self Care | Payer: Self-pay | Source: Ambulatory Visit | Attending: Cardiovascular Disease

## 2016-03-17 DIAGNOSIS — I739 Peripheral vascular disease, unspecified: Secondary | ICD-10-CM | POA: Diagnosis not present

## 2016-03-17 DIAGNOSIS — Z79899 Other long term (current) drug therapy: Secondary | ICD-10-CM | POA: Diagnosis not present

## 2016-03-17 DIAGNOSIS — Z86718 Personal history of other venous thrombosis and embolism: Secondary | ICD-10-CM | POA: Diagnosis not present

## 2016-03-17 DIAGNOSIS — I129 Hypertensive chronic kidney disease with stage 1 through stage 4 chronic kidney disease, or unspecified chronic kidney disease: Secondary | ICD-10-CM | POA: Diagnosis not present

## 2016-03-17 DIAGNOSIS — M79672 Pain in left foot: Secondary | ICD-10-CM | POA: Diagnosis not present

## 2016-03-17 DIAGNOSIS — N183 Chronic kidney disease, stage 3 (moderate): Secondary | ICD-10-CM | POA: Diagnosis not present

## 2016-03-17 DIAGNOSIS — M79671 Pain in right foot: Secondary | ICD-10-CM | POA: Diagnosis not present

## 2016-03-17 DIAGNOSIS — I7092 Chronic total occlusion of artery of the extremities: Secondary | ICD-10-CM | POA: Diagnosis not present

## 2016-03-17 DIAGNOSIS — I70213 Atherosclerosis of native arteries of extremities with intermittent claudication, bilateral legs: Secondary | ICD-10-CM | POA: Diagnosis not present

## 2016-03-17 DIAGNOSIS — Z947 Corneal transplant status: Secondary | ICD-10-CM | POA: Diagnosis not present

## 2016-03-17 DIAGNOSIS — E785 Hyperlipidemia, unspecified: Secondary | ICD-10-CM | POA: Diagnosis not present

## 2016-03-17 DIAGNOSIS — J449 Chronic obstructive pulmonary disease, unspecified: Secondary | ICD-10-CM | POA: Diagnosis not present

## 2016-03-17 HISTORY — PX: PERIPHERAL VASCULAR CATHETERIZATION: SHX172C

## 2016-03-17 LAB — CBC
HEMATOCRIT: 37.1 % (ref 36.0–46.0)
Hemoglobin: 11.9 g/dL — ABNORMAL LOW (ref 12.0–15.0)
MCH: 31.2 pg (ref 26.0–34.0)
MCHC: 32.1 g/dL (ref 30.0–36.0)
MCV: 97.1 fL (ref 78.0–100.0)
PLATELETS: 182 10*3/uL (ref 150–400)
RBC: 3.82 MIL/uL — ABNORMAL LOW (ref 3.87–5.11)
RDW: 13.9 % (ref 11.5–15.5)
WBC: 6.1 10*3/uL (ref 4.0–10.5)

## 2016-03-17 LAB — BASIC METABOLIC PANEL
Anion gap: 10 (ref 5–15)
BUN: 24 mg/dL — AB (ref 6–20)
CHLORIDE: 109 mmol/L (ref 101–111)
CO2: 21 mmol/L — AB (ref 22–32)
CREATININE: 1.64 mg/dL — AB (ref 0.44–1.00)
Calcium: 8.8 mg/dL — ABNORMAL LOW (ref 8.9–10.3)
GFR calc Af Amer: 34 mL/min — ABNORMAL LOW (ref >60)
GFR calc non Af Amer: 30 mL/min — ABNORMAL LOW (ref >60)
GLUCOSE: 85 mg/dL (ref 65–99)
POTASSIUM: 4 mmol/L (ref 3.5–5.1)
SODIUM: 140 mmol/L (ref 135–145)

## 2016-03-17 SURGERY — LOWER EXTREMITY INTERVENTION
Anesthesia: LOCAL

## 2016-03-17 MED ORDER — FENTANYL CITRATE (PF) 100 MCG/2ML IJ SOLN
INTRAMUSCULAR | Status: DC | PRN
  Administered 2016-03-17: 25 ug via INTRAVENOUS

## 2016-03-17 MED ORDER — CLOPIDOGREL BISULFATE 75 MG PO TABS
75.0000 mg | ORAL_TABLET | Freq: Every day | ORAL | Status: DC
  Filled 2016-03-17: qty 1

## 2016-03-17 MED ORDER — LIDOCAINE HCL (PF) 1 % IJ SOLN
INTRAMUSCULAR | Status: AC
  Filled 2016-03-17: qty 30

## 2016-03-17 MED ORDER — LIDOCAINE HCL (PF) 1 % IJ SOLN
INTRAMUSCULAR | Status: DC | PRN
  Administered 2016-03-17: 25 mL via INTRADERMAL

## 2016-03-17 MED ORDER — HEPARIN (PORCINE) IN NACL 2-0.9 UNIT/ML-% IJ SOLN
INTRAMUSCULAR | Status: AC
  Filled 2016-03-17: qty 1000

## 2016-03-17 MED ORDER — FENTANYL CITRATE (PF) 100 MCG/2ML IJ SOLN
INTRAMUSCULAR | Status: AC
  Filled 2016-03-17: qty 2

## 2016-03-17 MED ORDER — ACETAMINOPHEN 325 MG PO TABS
650.0000 mg | ORAL_TABLET | ORAL | Status: DC | PRN
  Administered 2016-03-17 – 2016-03-18 (×2): 650 mg via ORAL
  Filled 2016-03-17 (×2): qty 2

## 2016-03-17 MED ORDER — HEPARIN (PORCINE) IN NACL 2-0.9 UNIT/ML-% IJ SOLN
INTRAMUSCULAR | Status: DC | PRN
  Administered 2016-03-17: 1000 mL

## 2016-03-17 MED ORDER — MIDAZOLAM HCL 2 MG/2ML IJ SOLN
INTRAMUSCULAR | Status: AC
  Filled 2016-03-17: qty 2

## 2016-03-17 MED ORDER — IODIXANOL 320 MG/ML IV SOLN
INTRAVENOUS | Status: DC | PRN
  Administered 2016-03-17: 97 mL via INTRA_ARTERIAL

## 2016-03-17 MED ORDER — MIDAZOLAM HCL 2 MG/2ML IJ SOLN
INTRAMUSCULAR | Status: DC | PRN
  Administered 2016-03-17: 1 mg via INTRAVENOUS

## 2016-03-17 MED ORDER — ONDANSETRON HCL 4 MG/2ML IJ SOLN: 4.0000 mg | Freq: Four times a day (QID) | INTRAMUSCULAR | Status: DC | PRN

## 2016-03-17 MED ORDER — ASPIRIN EC 81 MG PO TBEC
81.0000 mg | DELAYED_RELEASE_TABLET | Freq: Every day | ORAL | Status: DC
  Filled 2016-03-17 (×2): qty 1

## 2016-03-17 MED ORDER — SODIUM CHLORIDE 0.9 % IV SOLN
INTRAVENOUS | Status: AC
  Administered 2016-03-17 (×2): via INTRAVENOUS

## 2016-03-17 SURGICAL SUPPLY — 10 items
CATH ANGIO 5F PIGTAIL 65CM (CATHETERS) ×1 IMPLANT
DEVICE CLOSURE PERCLS PRGLD 6F (VASCULAR PRODUCTS) IMPLANT
KIT PV (KITS) ×2 IMPLANT
PERCLOSE PROGLIDE 6F (VASCULAR PRODUCTS) ×2
SHEATH PINNACLE 5F 10CM (SHEATH) ×1 IMPLANT
STOPCOCK MORSE 400PSI 3WAY (MISCELLANEOUS) ×1 IMPLANT
TRANSDUCER W/STOPCOCK (MISCELLANEOUS) ×2 IMPLANT
TRAY PV CATH (CUSTOM PROCEDURE TRAY) ×2 IMPLANT
TUBING CIL FLEX 10 FLL-RA (TUBING) ×1 IMPLANT
WIRE HITORQ VERSACORE ST 145CM (WIRE) ×1 IMPLANT

## 2016-03-17 NOTE — H&P (View-Only) (Signed)
[name] was referred to me by Dr. Milinda Pointer for bilateral foot pain and for peripheral vascular evaluation prior to potential surgery. She had Doppler studies performed in our office 02/25/16 revealing a right ABI 0.66 LF 0.53. She had high frequency signals in both SFAs as well as tibial vessel disease. We'll plan on performing peripheral angiography and potential intervention on her right leg initially since it is most affected extremity with staged intervention on her left leg.

## 2016-03-17 NOTE — Progress Notes (Signed)
Patient Name: [name] Date of Encounter: 03/17/2016  Primary Cardiologist: Dr. Serena Croissant Problem List     Active Problems:   Bilateral foot pain   Stage 3 chronic kidney disease   PVD (peripheral vascular disease) (HCC)     Subjective   Feels well, had some questions about her PV Angiogram today, questions answered.   Inpatient Medications    Scheduled Meds: . cholecalciferol  1,000 Units Oral Daily  . heparin  5,000 Units Subcutaneous Q8H  . nicotine  14 mg Transdermal Daily  . sodium chloride flush  3 mL Intravenous Q12H   Continuous Infusions: . sodium chloride 75 mL/hr at 03/17/16 0725   PRN Meds: sodium chloride, acetaminophen, albuterol, nitroGLYCERIN, ondansetron (ZOFRAN) IV, sodium chloride flush   Vital Signs    Vitals:   03/16/16 1057 03/16/16 1327 03/16/16 2105 03/17/16 0627  BP: 134/70 125/75 (!) 108/57 (!) 134/58  Pulse: 83 85 76 71  Resp: '20 18 18 18  '$ Temp: 97.7 F (36.5 C) 97 F (36.1 C) 98.1 F (36.7 C) 98.2 F (36.8 C)  TempSrc: Oral Oral Oral Oral  SpO2: 100% 94% 96% 100%  Weight: 156 lb 8.4 oz (71 kg)     Height: '5\' 4"'$  (1.626 m)       Intake/Output Summary (Last 24 hours) at 03/17/16 0912 Last data filed at 03/16/16 1800  Gross per 24 hour  Intake              480 ml  Output                0 ml  Net              480 ml   Filed Weights   03/16/16 1057  Weight: 156 lb 8.4 oz (71 kg)    Physical Exam   GEN: Well nourished, well developed, in no acute distress.  HEENT: Grossly normal.  Neck: Supple, no JVD, carotid bruits, or masses. Cardiac: RRR, no murmurs, rubs, or gallops. No clubbing, cyanosis, edema.  Radials/DP/PT 2+ and equal bilaterally.  Respiratory:  Respirations regular and unlabored, clear to auscultation bilaterally. GI: Soft, nontender, nondistended, BS + x 4. MS: no deformity or atrophy. Skin: warm and dry, no rash. Neuro:  Strength and sensation are intact. Psych: AAOx3.  Normal affect.  Labs      CBC  Recent Labs  03/16/16 1309 03/17/16 0305  WBC 7.7 6.1  HGB 12.6 11.9*  HCT 38.8 37.1  MCV 96.5 97.1  PLT 184 417   Basic Metabolic Panel  Recent Labs  03/16/16 1309 03/17/16 0305  NA  --  140  K  --  4.0  CL  --  109  CO2  --  21*  GLUCOSE  --  85  BUN  --  24*  CREATININE 1.62* 1.64*  CALCIUM  --  8.8*     Telemetry    NSR - Personally Reviewed  ECG    NSR - Personally Reviewed  Radiology    No results found.  Patient Profile     75 year old female with a past medical history of HTN, ongoing tobacco abuse, HLD, COPD, CKD stage III. Admitted yesterday for hydration pre-procedure, having PV angiography today on right leg.   Assessment & Plan    1. Peripheral arterial disease: Doppler studies performed in our office 02/25/16 revealing a right ABI 0.66 LF 0.53. She had high frequency signals in both SFAs as well as tibial vessel  disease.  Plan for peripheral angiography and potential intervention on her right leg, and staged intervention on the left leg a few weeks after.   Creatinine down to 1.64 from 1.8 earlier in the month, but creatinine has not changed much overnight with IV hydration.   2. Hypertension: HCTZ on hold, but likely should discontinue all together given her CKD and switch her to Norvasc. HCTZ is the only BP medication she is on at home.   3. HLD: last LDL was 108 in 2013, will order lipid panel for the am.   4. Tobacco abuse: Encouraged cessation.   Signed, Arbutus Leas, NP  03/17/2016, 9:12 AM   Attending Note:   The patient was seen and examined.  Agree with assessment and plan as noted above.  Changes made to the above note as needed.  Patient seen and independently examined with Jettie Booze, NP.   We discussed all aspects of the encounter. I agree with the assessment and plan as stated above.  Pt has significant PVD. Still smokes. Advised cessation  For PV procedure later today     I have spent a total of 40  minutes with patient reviewing hospital  notes , telemetry, EKGs, labs and examining patient as well as establishing an assessment and plan that was discussed with the patient. > 50% of time was spent in direct patient care.    Thayer Headings, Brooke Bonito., MD, [facility] 03/17/2016, 12:45 PM [address],  [place] Pager [phone]

## 2016-03-17 NOTE — Interval H&P Note (Signed)
History and Physical Interval Note:  03/17/2016 2:38 PM  [name]  has presented today for surgery, with the diagnosis of pvd  The various methods of treatment have been discussed with the patient and family. After consideration of risks, benefits and other options for treatment, the patient has consented to  Procedure(s): Lower Extremity Intervention (N/A) as a surgical intervention .  [name] history has been reviewed, patient examined, no change in status, stable for surgery.  I have reviewed the patient's chart and labs.  Questions were answered to the patient's satisfaction.     [name]

## 2016-03-18 ENCOUNTER — Encounter (HOSPITAL_COMMUNITY): Payer: Self-pay

## 2016-03-18 ENCOUNTER — Telehealth: Payer: Self-pay | Admitting: Cardiovascular Disease

## 2016-03-18 DIAGNOSIS — Z86718 Personal history of other venous thrombosis and embolism: Secondary | ICD-10-CM | POA: Diagnosis not present

## 2016-03-18 DIAGNOSIS — M79672 Pain in left foot: Secondary | ICD-10-CM

## 2016-03-18 DIAGNOSIS — J449 Chronic obstructive pulmonary disease, unspecified: Secondary | ICD-10-CM | POA: Diagnosis not present

## 2016-03-18 DIAGNOSIS — E785 Hyperlipidemia, unspecified: Secondary | ICD-10-CM | POA: Diagnosis not present

## 2016-03-18 DIAGNOSIS — Z947 Corneal transplant status: Secondary | ICD-10-CM | POA: Diagnosis not present

## 2016-03-18 DIAGNOSIS — M79671 Pain in right foot: Secondary | ICD-10-CM

## 2016-03-18 DIAGNOSIS — I129 Hypertensive chronic kidney disease with stage 1 through stage 4 chronic kidney disease, or unspecified chronic kidney disease: Secondary | ICD-10-CM | POA: Diagnosis not present

## 2016-03-18 DIAGNOSIS — Z79899 Other long term (current) drug therapy: Secondary | ICD-10-CM | POA: Diagnosis not present

## 2016-03-18 DIAGNOSIS — I7092 Chronic total occlusion of artery of the extremities: Secondary | ICD-10-CM | POA: Diagnosis not present

## 2016-03-18 DIAGNOSIS — I70213 Atherosclerosis of native arteries of extremities with intermittent claudication, bilateral legs: Secondary | ICD-10-CM | POA: Diagnosis not present

## 2016-03-18 DIAGNOSIS — N183 Chronic kidney disease, stage 3 (moderate): Secondary | ICD-10-CM | POA: Diagnosis not present

## 2016-03-18 DIAGNOSIS — I739 Peripheral vascular disease, unspecified: Secondary | ICD-10-CM | POA: Diagnosis not present

## 2016-03-18 LAB — CBC
HEMATOCRIT: 37.9 % (ref 36.0–46.0)
HEMOGLOBIN: 12.1 g/dL (ref 12.0–15.0)
MCH: 30.9 pg (ref 26.0–34.0)
MCHC: 31.9 g/dL (ref 30.0–36.0)
MCV: 96.9 fL (ref 78.0–100.0)
Platelets: 182 10*3/uL (ref 150–400)
RBC: 3.91 MIL/uL (ref 3.87–5.11)
RDW: 13.8 % (ref 11.5–15.5)
WBC: 6.2 10*3/uL (ref 4.0–10.5)

## 2016-03-18 LAB — BASIC METABOLIC PANEL
ANION GAP: 6 (ref 5–15)
BUN: 18 mg/dL (ref 6–20)
CALCIUM: 9.2 mg/dL (ref 8.9–10.3)
CO2: 28 mmol/L (ref 22–32)
Chloride: 107 mmol/L (ref 101–111)
Creatinine, Ser: 1.33 mg/dL — ABNORMAL HIGH (ref 0.44–1.00)
GFR, EST AFRICAN AMERICAN: 44 mL/min — AB (ref >60)
GFR, EST NON AFRICAN AMERICAN: 38 mL/min — AB (ref >60)
GLUCOSE: 87 mg/dL (ref 65–99)
POTASSIUM: 4.4 mmol/L (ref 3.5–5.1)
SODIUM: 141 mmol/L (ref 135–145)

## 2016-03-18 MED ORDER — ASPIRIN 81 MG PO TBEC: 81.0000 mg | DELAYED_RELEASE_TABLET | Freq: Every day | ORAL | Status: DC

## 2016-03-18 MED ORDER — CLOPIDOGREL BISULFATE 75 MG PO TABS: 75.0000 mg | ORAL_TABLET | Freq: Every day | ORAL | 12 refills | Status: DC

## 2016-03-18 NOTE — Progress Notes (Signed)
Patient in a stable condition, discharge education completed with patient and her care giver [name], they verbalized understanding , patient belongings at bedside, iv removed tel dc ccmd notified, patient taken off the unit on a wheelchair by this RN to meet her caregiver at the emergency room exit

## 2016-03-18 NOTE — Telephone Encounter (Signed)
Closed encounter °

## 2016-03-18 NOTE — Progress Notes (Signed)
Patient Name: [name] Date of Encounter: 03/18/2016  Primary Cardiologist: Dr. Serena Croissant Problem List     Active Problems:   Bilateral foot pain   Stage 3 chronic kidney disease   PVD (peripheral vascular disease) (HCC)     Subjective   Angry, anxious to go. Raising her voice and shaking her fists.   Inpatient Medications    Scheduled Meds: . aspirin EC  81 mg Oral Daily  . clopidogrel  75 mg Oral Q breakfast  . nicotine  14 mg Transdermal Daily   Continuous Infusions:  PRN Meds: acetaminophen, ondansetron (ZOFRAN) IV   Vital Signs    Vitals:   03/17/16 1800 03/17/16 1830 03/17/16 2043 03/18/16 0408  BP: (!) 151/77 (!) 147/62 (!) 137/53 (!) 161/63  Pulse: 70 69 66 77  Resp:   18 19  Temp:   97.9 F (36.6 C) 97.8 F (36.6 C)  TempSrc:   Oral Oral  SpO2:   100% 100%  Weight:      Height:        Intake/Output Summary (Last 24 hours) at 03/18/16 1041 Last data filed at 03/18/16 0400  Gross per 24 hour  Intake           908.75 ml  Output              150 ml  Net           758.75 ml   Filed Weights   03/16/16 1057  Weight: 156 lb 8.4 oz (71 kg)    Physical Exam   GEN: Well nourished, well developed, in no acute distress.  HEENT: Grossly normal.  Neck: Supple, no JVD, carotid bruits, or masses. Cardiac: RRR, no murmurs, rubs, or gallops. No clubbing, cyanosis, edema.  Radials/DP/PT 2+ and equal bilaterally.  Respiratory:  Respirations regular and unlabored, clear to auscultation bilaterally. GI: Soft, nontender, nondistended, BS + x 4. MS: no deformity or atrophy. Skin: warm and dry, no rash. Neuro:  Strength and sensation are intact. Psych: AAOx3.  Normal affect.  Labs    CBC  Recent Labs  03/17/16 0305 03/18/16 0351  WBC 6.1 6.2  HGB 11.9* 12.1  HCT 37.1 37.9  MCV 97.1 96.9  PLT 182 798   Basic Metabolic Panel  Recent Labs  03/17/16 0305 03/18/16 0351  NA 140 141  K 4.0 4.4  CL 109 107  CO2 21* 28  GLUCOSE 85  87  BUN 24* 18  CREATININE 1.64* 1.33*  CALCIUM 8.8* 9.2     Telemetry    NSR- Personally Reviewed  ECG    NSR - Personally Reviewed  Radiology    No results found.  Cardiac Studies  Angiographic Data:   1: Abdominal aortogram-the distal abdominal aorta was free of significant disease 2: Left lower extremity-there was a 99% focal proximal left SFA stenosis followed by 90% segmental mid left SFA stenosis with 1 vessel runoff via an anterior tibial artery. 3: Right lower extremity-there was a short segmental occlusion mid right SFA, short segment total occlusion right tibioperoneal trunk, with 1 vessel runoff via the peroneal. The anterior tibial and posterior tibial appeared occluded.  IMPRESSION: [name] has severe SFA and tibial vessel disease with lifestyle of an claudication right greater than left. Her serum creatinine was 1.64 with a creatinine clearance of 13 8 mL/m. I decided to perform diagnostic PV angiogram only at this setting. I performed Perclose of her left common femoral artery puncture site achieving  excellent hemostasis. She will be hydrated overnight, discharged on the morning. I will see her back in my office this coming Friday at which time we will plan intervention of her right SFA next week. She left the lab in stable condition   Patient Profile     75 year old female with a past medical history of HTN, ongoing tobacco abuse, HLD, COPD, CKD stage III. Admitted 03/16/16 for hydration for PV Angiogram, will return to see [name] this Friday to schedule intervention of her right SFA next week.   Assessment & Plan    1. Peripheral arterial disease: Doppler studies performed in our office 02/25/16 revealing a right ABI 0.66 LF 0.53. She had high frequency signals in both SFAs as well as tibial vessel disease.  PV angiography report above. She will return next week for intervention of her right SFA.   Creatinine down to 1.33 from 1.8 earlier in the  month, but creatinine has not changed much overnight with IV hydration.   2. Hypertension: HCTZ on hold, but likely should discontinue all together given her CKD and switch her to Norvasc. HCTZ is the only BP medication she is on at home.   3. HLD: last LDL was 108 in 2013, will order lipid panel for the am.   4. Tobacco abuse: Encouraged cessation.   Signed, Arbutus Leas, NP  03/18/2016, 10:41 AM   Attending Note:   The patient was seen and examined.  Agree with assessment and plan as noted above.  Changes made to the above note as needed.  Patient seen and independently examined with Jettie Booze, NP.   We discussed all aspects of the encounter. I agree with the assessment and plan as stated above.  Pt is doing well  Ready to go home Follow up with [name]    I have spent a total of 40 minutes with patient reviewing hospital  notes , telemetry, EKGs, labs and examining patient as well as establishing an assessment and plan that was discussed with the patient. > 50% of time was spent in direct patient care.    Thayer Headings, Brooke Bonito., MD, [facility] 03/18/2016, 11:08 AM [address],  [place] Pager [phone]

## 2016-03-18 NOTE — Care Management Note (Signed)
Case Management Note  Patient Details  Name: [name] MRN: [id number] Date of Birth: [birth year]  Subjective/Objective:     PVD, abdominal aortogram, bilateral iliac angiogram, bifemoral runoff               Action/Plan: Discharge Planning: AVS reviewed:  NCM spoke to pt and she has aide that comes everyday. States she has assessment scheduled to see if she will approved for more hours. Son assist her with getting medications.   PCP  Gilles Chiquito B   Expected Discharge Date:  03/18/2016               Expected Discharge Plan:  Home/Self Care  In-House Referral:  NA  Discharge planning Services  CM Consult  Post Acute Care Choice:    Choice offered to:  NA  DME Arranged:  N/A DME Agency:  NA  HH Arranged:  NA HH Agency:  NA  Status of Service:  Completed, signed off  If discussed at [place] of Stay Meetings, dates discussed:    Additional Comments:  Erenest Rasher, RN 03/18/2016, 12:22 PM

## 2016-03-18 NOTE — Care Management Obs Status (Signed)
[place] NOTIFICATION   Patient Details  Name: [name] MRN: [id number] Date of Birth: [birth year]   Medicare Observation Status Notification Given:  Yes    Erenest Rasher, RN 03/18/2016, 10:32 AM

## 2016-03-18 NOTE — Discharge Summary (Signed)
Discharge Summary    Patient ID: [name],  MRN: [id number], DOB/AGE: 1940/09/11 75 y.o.  Admit date: 03/16/2016 Discharge date: 03/18/2016  Primary Care Provider: Gilles Chiquito Primary Cardiologist: [name]  Discharge Diagnoses    Active Problems:   Bilateral foot pain   Stage 3 chronic kidney disease   PVD (peripheral vascular disease) (HCC)   Allergies Allergies  Allergen Reactions  . Codeine Other (See Comments)    REACTION: Pt c/o "feeling high"  . Morphine Other (See Comments)    REACTION: feels funny and "high"    Diagnostic Studies/Procedures    PV Angiogram/Intervention    History obtained from chart review.[name] was referred to me by Dr. Milinda Pointer, her podiatrist, for peripheral vascular evaluation and preoperative clearance. She has a history of treated hypertension and long-standing tobacco abuse. She is not diabetic. She apparently has lipidemia but not on statin therapy. She has never had a heart attack or stroke. She's had prior foot surgeries in the past and has bilateral foot pain right greater than left making it difficult to walk. She saw her podiatrist wants to perform a surgical procedure and she was referred here for vascular clearance. She does have palpable pedal pulses in her right foot.  Pre Procedure Diagnosis: Peripheral arterial disease  Post Procedure Diagnosis: Peripheral arterial disease  Operators: Dr. [name]  Procedures Performed:            1. Left groin access            2. Abdominal aortogram, bilateral iliac angiogram, bifemoral runoff            3. Perclose left common femoral artery              PROCEDURE DESCRIPTION:   The patient was brought to the second floor [facility] lab in the the postabsorptive state. She was premedicated with Valium 5 mg by mouth, IV Versed and fentanyl. Her left groin was prepped and shaved in usual sterile fashion. Xylocaine 1% was used for local  anesthesia. A 5 French sheath was inserted into the left common femoral  artery using standard Seldinger technique. A 5 French pigtail catheter was placed in the distal abdominal aorta. Distal abdominal aortogram, bilateral iliac angiogram bifemoral runoff was performed using bolus chase, digital subtraction and step table technique. Omnipaque dye was used for the entirety of the case. Retrograde aortic pressure was monitored during the case.   Angiographic Data:   1: Abdominal aortogram-the distal abdominal aorta was free of significant disease 2: Left lower extremity-there was a 99% focal proximal left SFA stenosis followed by 90% segmental mid left SFA stenosis with 1 vessel runoff via an anterior tibial artery. 3: Right lower extremity-there was a short segmental occlusion mid right SFA, short segment total occlusion right tibioperoneal trunk, with 1 vessel runoff via the peroneal. The anterior tibial and posterior tibial appeared occluded.  IMPRESSION: [name] has severe SFA and tibial vessel disease with lifestyle of an claudication right greater than left. Her serum creatinine was 1.64 with a creatinine clearance of 13 8 mL/m. I decided to perform diagnostic PV angiogram only at this setting. I performed Perclose of her left common femoral artery puncture site achieving excellent hemostasis. She will be hydrated overnight, discharged on the morning. I will see her back in my office this coming Friday at which time we will plan intervention of her right SFA next week. She left the lab in stable  condition.  _____________   History of Present Illness   [name] is a 75 year old female with a past medical history of HTN, current tobacco abuse, HLD, and PAD. She was referred to [name] by her podiatrist, who wants to preform a surgical procedure and needs vascular clearance. Therefore, [name] preformed [facility]  Full PV report above.   She was admitted on  03/16/16 for hydration pre procedure.   [name] has severe SFA and tibial vessel disease with lifestyle of an claudication right greater than left. Her serum creatinine was 1.64 with a creatinine clearance of 13 8 mL/m. [name] decided to perform diagnostic PV angiogram only at this setting.   [name] performed Perclose of her left common femoral artery puncture site achieving excellent hemostasis. She will be hydrated overnight, discharged on the morning. [name] will see her back in his office this coming Friday at which time we will plan intervention of her right SFA next week. She left the lab in stable condition.   She was seen today by Dr. Acie Fredrickson and deemed suitable for discharge.  __________  Discharge Vitals Blood pressure (!) 161/63, pulse 77, temperature 97.8 F (36.6 C), temperature source Oral, resp. rate 19, height '5\' 4"'$  (1.626 m), weight 156 lb 8.4 oz (71 kg), SpO2 100 %.  Filed Weights   03/16/16 1057  Weight: 156 lb 8.4 oz (71 kg)    Labs & Radiologic Studies     CBC  Recent Labs  03/17/16 0305 03/18/16 0351  WBC 6.1 6.2  HGB 11.9* 12.1  HCT 37.1 37.9  MCV 97.1 96.9  PLT 182 250   Basic Metabolic Panel  Recent Labs  03/17/16 0305 03/18/16 0351  NA 140 141  K 4.0 4.4  CL 109 107  CO2 21* 28  GLUCOSE 85 87  BUN 24* 18  CREATININE 1.64* 1.33*  CALCIUM 8.8* 9.2    Dg Chest 2 View  Result Date: 03/06/2016 CLINICAL DATA:  Preop for cardiac posterior EXAM: CHEST  2 VIEW COMPARISON:  9/30/ 16 FINDINGS: Cardiomediastinal silhouette is stable. Mild hyperinflation. There is reticular mild interstitial prominence bilateral probable chronic in nature. No superimposed infiltrate or pulmonary edema. IMPRESSION: Mild hyperinflation. Probable chronic reticular mild interstitial prominence. No superimposed infiltrate or pulmonary edema. Electronically Signed   By: Lahoma Crocker M.D.   On: 03/06/2016 09:55    Disposition   Pt is being discharged home today  in good condition.  Follow-up Plans & Appointments    Follow-up Information    [name], MD Follow up on 03/19/2016.   Specialties:  Cardiology, Radiology Why:  at 2:15 for follow up.  Contact information: [address] [place] [address] [phone]          Discharge Instructions    Diet - low sodium heart healthy    Complete by:  As directed    Increase activity slowly    Complete by:  As directed       Discharge Medications   Current Discharge Medication List    START taking these medications   Details  aspirin EC 81 MG EC tablet Take 1 tablet (81 mg total) by mouth daily.    clopidogrel (PLAVIX) 75 MG tablet Take 1 tablet (75 mg total) by mouth daily with breakfast. Qty: 30 tablet, Refills: 12      CONTINUE these medications which have NOT CHANGED   Details  acetaminophen (TYLENOL) 500 MG  tablet Take 500 mg by mouth every 6 (six) hours as needed (for pain/foot pain.).    hydrochlorothiazide (MICROZIDE) 12.5 MG capsule Take 1 capsule (12.5 mg total) by mouth daily. Qty: 30 capsule, Refills: 6    prednisoLONE acetate (PRED FORTE) 1 % ophthalmic suspension Place 1 drop into both eyes daily.    albuterol (PROVENTIL HFA;VENTOLIN HFA) 108 (90 Base) MCG/ACT inhaler Inhale 2 puffs into the lungs every 6 (six) hours as needed for wheezing or shortness of breath. Qty: 1 Inhaler, Refills: 6    Vitamin D, Cholecalciferol, 1000 units CAPS Take 1 capsule by mouth daily. Qty: 90 capsule, Refills: 3      STOP taking these medications     ibuprofen (ADVIL,MOTRIN) 200 MG tablet            Outstanding Labs/Studies     Duration of Discharge Encounter   Greater than 30 minutes including physician time.  Signed, Arbutus Leas NP 03/18/2016, 11:59 AM   Attending Note:   The patient was seen and examined.  Agree with assessment and plan as noted above.  Changes made to the above note as needed.  Patient seen and independently examined  with Jettie Booze , NP.   We discussed all aspects of the encounter. I agree with the assessment and plan as stated above.  PVD - had a PV procedure. Tolerated it well. Will follow u0p with [name]    I have spent a total of 40 minutes with patient reviewing hospital  notes , telemetry, EKGs, labs and examining patient as well as establishing an assessment and plan that was discussed with the patient. > 50% of time was spent in direct patient care.    Thayer Headings, Brooke Bonito., MD, [facility] 03/18/2016, 4:41 PM [address],  [place] Pager [phone]

## 2016-03-19 ENCOUNTER — Encounter: Payer: Self-pay | Admitting: Internal Medicine

## 2016-03-19 ENCOUNTER — Ambulatory Visit: Payer: Medicare Other | Admitting: Cardiovascular Disease

## 2016-03-19 ENCOUNTER — Ambulatory Visit: Payer: Medicare Other | Admitting: Internal Medicine

## 2016-03-19 ENCOUNTER — Encounter: Payer: Self-pay | Admitting: *Deleted

## 2016-03-22 ENCOUNTER — Encounter (HOSPITAL_COMMUNITY): Payer: Self-pay

## 2016-03-22 ENCOUNTER — Emergency Department (HOSPITAL_COMMUNITY): Payer: Medicare Other

## 2016-03-22 ENCOUNTER — Emergency Department (HOSPITAL_COMMUNITY)
Admission: EM | Admit: 2016-03-22 | Discharge: 2016-03-22 | Disposition: A | Discharge status: Home / Self Care | Payer: Medicare Other | Attending: Emergency Medicine | Admitting: Emergency Medicine

## 2016-03-22 DIAGNOSIS — M25512 Pain in left shoulder: Secondary | ICD-10-CM | POA: Diagnosis not present

## 2016-03-22 DIAGNOSIS — Z7982 Long term (current) use of aspirin: Secondary | ICD-10-CM | POA: Diagnosis not present

## 2016-03-22 DIAGNOSIS — F17210 Nicotine dependence, cigarettes, uncomplicated: Secondary | ICD-10-CM | POA: Insufficient documentation

## 2016-03-22 DIAGNOSIS — M542 Cervicalgia: Secondary | ICD-10-CM | POA: Diagnosis not present

## 2016-03-22 DIAGNOSIS — I129 Hypertensive chronic kidney disease with stage 1 through stage 4 chronic kidney disease, or unspecified chronic kidney disease: Secondary | ICD-10-CM | POA: Diagnosis not present

## 2016-03-22 DIAGNOSIS — Z79899 Other long term (current) drug therapy: Secondary | ICD-10-CM | POA: Insufficient documentation

## 2016-03-22 DIAGNOSIS — N183 Chronic kidney disease, stage 3 (moderate): Secondary | ICD-10-CM | POA: Insufficient documentation

## 2016-03-22 DIAGNOSIS — J449 Chronic obstructive pulmonary disease, unspecified: Secondary | ICD-10-CM | POA: Insufficient documentation

## 2016-03-22 DIAGNOSIS — R030 Elevated blood-pressure reading, without diagnosis of hypertension: Secondary | ICD-10-CM | POA: Diagnosis not present

## 2016-03-22 MED ORDER — ACETAMINOPHEN 325 MG PO TABS
650.0000 mg | ORAL_TABLET | Freq: Once | ORAL | Status: AC
  Administered 2016-03-22: 650 mg via ORAL
  Filled 2016-03-22: qty 2

## 2016-03-22 NOTE — ED Triage Notes (Signed)
Pt brought in by [facility]. Pt c/o of left sided neck and shoulder pain. Pt stated that pain started this morning and has not taken anything for it. Pt has hx of bilateral LE DVT.

## 2016-03-22 NOTE — Discharge Instructions (Signed)
Continue tylenol for pain at home. Follow up with your doctor or orthopedics specialist if not improving.

## 2016-03-22 NOTE — ED Provider Notes (Signed)
[facility] DEPT Provider Note   CSN: [id number] Arrival date & time: 03/22/16  2047     History   Chief Complaint Chief Complaint  Patient presents with  . Shoulder Pain    HPI [name] is a 75 y.o. female.  HPI [name] is a 75 y.o. female presents to emergency department complaining of left shoulder and left neck pain. Patient states her pain started this morning after waking up. She reports pain with movement of her neck on movement of the left shoulder. She denies any prior similar symptoms. She recently had an arteriogram of her lower extremities for claudication and was diagnosed with peripheral vascular disease. She has been taking aspirin for her pain with some improvement. She denies any injuries. Denies numbness or weakness in her arm. Denies any swelling in her arm.  Past Medical History:  Diagnosis Date  . Blind    s/p bilateral corneal transplant - sees well now  . DVT (deep venous thrombosis) ([place])   . HEARING LOSS   . Hypertension   . Phlebitis     Patient Active Problem List   Diagnosis Date Noted  . Stage 3 chronic kidney disease   . PVD (peripheral vascular disease) ([place])   . Bilateral foot pain 02/20/2016  . Epidermoid cyst 11/01/2015  . Left shoulder pain 01/25/2015  . Right wrist pain 11/02/2014  . Vitamin D deficiency 03/29/2014  . COPD with acute lower respiratory infection ([place]) 02/21/2014  . Postmenopausal 06/16/2013  . Preventative health care 06/16/2013  . Bilateral shoulder pain 09/01/2012  . Bilateral lower extremity edema 11/19/2011  . CKD (chronic kidney disease) stage 3, GFR 30-59 ml/min 11/28/2009  . Hyperlipidemia 07/02/2006  . DEAFNESS 05/13/2006  . TOBACCO ABUSE 03/26/2006  . BLINDNESS 03/26/2006  . Essential hypertension 03/26/2006    Past Surgical History:  Procedure Laterality Date  . EYE SURGERY    . FOOT SURGERY    . PERIPHERAL VASCULAR CATHETERIZATION N/A 03/17/2016   Procedure: Lower Extremity  Intervention;  Surgeon: Lorretta Harp, MD;  Location: [facility];  Service: Cardiovascular;  Laterality: N/A;  . right wrist    . SHOULDER OPEN ROTATOR CUFF REPAIR  1973   right shoulder, secondary numbness in R hand    OB History    No data available       Home Medications    Prior to Admission medications   Medication Sig Start Date End Date Taking? Authorizing Provider  acetaminophen (TYLENOL) 500 MG tablet Take 500 mg by mouth every 6 (six) hours as needed (for pain/foot pain.).    Historical Provider, MD  albuterol (PROVENTIL HFA;VENTOLIN HFA) 108 (90 Base) MCG/ACT inhaler Inhale 2 puffs into the lungs every 6 (six) hours as needed for wheezing or shortness of breath. 08/15/15   Sid Falcon, MD  aspirin EC 81 MG EC tablet Take 1 tablet (81 mg total) by mouth daily. 03/19/16   Arbutus Leas, NP  clopidogrel (PLAVIX) 75 MG tablet Take 1 tablet (75 mg total) by mouth daily with breakfast. 03/19/16   Arbutus Leas, NP  hydrochlorothiazide (MICROZIDE) 12.5 MG capsule Take 1 capsule (12.5 mg total) by mouth daily. 08/15/15   Sid Falcon, MD  prednisoLONE acetate (PRED FORTE) 1 % ophthalmic suspension Place 1 drop into both eyes daily.    Historical Provider, MD  Vitamin D, Cholecalciferol, 1000 units CAPS Take 1 capsule by mouth daily. Patient not taking: Reported on 03/11/2016 08/15/15   Raquel Sarna  Marsh Dolly, MD    Family History Family History  Problem Relation Age of Onset  . Cancer Maternal Grandmother     liver  . Cancer Maternal Uncle     colon  . Cancer Mother     liver  . Cancer Brother     Unknown  . Cancer Sister     Unknown    Social History Social History  Substance Use Topics  . Smoking status: Current Every Day Smoker    Packs/day: 0.50    Years: 20.00    Types: Cigarettes  . Smokeless tobacco: Never Used     Comment: .  Marland Kitchen Alcohol use No     Allergies   Codeine and Morphine   Review of Systems Review of Systems  Constitutional: Negative for  chills and fever.  Respiratory: Negative for cough, chest tightness and shortness of breath.   Cardiovascular: Negative for chest pain, palpitations and leg swelling.  Gastrointestinal: Negative for abdominal pain, diarrhea, nausea and vomiting.  Musculoskeletal: Positive for arthralgias and joint swelling. Negative for myalgias, neck pain and neck stiffness.  Skin: Negative for rash.  Neurological: Negative for dizziness, weakness, numbness and headaches.  All other systems reviewed and are negative.    Physical Exam Updated Vital Signs BP 128/72   Pulse 67   Temp 97.7 F (36.5 C) (Oral)   Resp 16   Ht '5\' 5"'$  (1.651 m)   Wt 70.8 kg   SpO2 98%   BMI 25.96 kg/m   Physical Exam  Constitutional: She is oriented to person, place, and time. She appears well-developed and well-nourished. No distress.  HENT:  Head: Normocephalic.  Eyes: Conjunctivae are normal.  Neck: Trachea normal and normal range of motion. Neck supple. Carotid bruit is not present. No neck rigidity. Normal range of motion present.  TTP over left trapezius and left posterior shoulder.   Cardiovascular: Normal rate, regular rhythm and normal heart sounds.   Pulmonary/Chest: Effort normal and breath sounds normal. No respiratory distress. She has no wheezes. She has no rales.  Musculoskeletal: She exhibits no edema.  TTP over posterior left shoulder. Pain with ROM of the left shoulder actively and passively. Full ROM. Bicep, tricep, deltoid strength intact. Distal radial pulse intact. Grip strength is 5/5.   Neurological: She is alert and oriented to person, place, and time.  Skin: Skin is warm and dry.  Psychiatric: She has a normal mood and affect. Her behavior is normal.  Nursing note and vitals reviewed.    ED Treatments / Results  Labs (all labs ordered are listed, but only abnormal results are displayed) Labs Reviewed - No data to display  EKG  EKG Interpretation  Date/Time:  Saturday March 22 2016 21:01:03 EST Ventricular Rate:  70 PR Interval:    QRS Duration: 80 QT Interval:  400 QTC Calculation: 432 R Axis:   60 Text Interpretation:  Sinus rhythm no acute ST/T changes no significant change since Sept 2016 Confirmed by Regenia Skeeter MD, [facility]OTT [phone]) on 03/22/2016 9:26:58 PM       Radiology Dg Shoulder Left  Result Date: 03/22/2016 CLINICAL DATA:  Left-sided neck pain this morning that worked it's way down to the left shoulder. No known injury. EXAM: LEFT SHOULDER - 2+ VIEW COMPARISON:  09/02/2012 FINDINGS: No evidence for fracture. No evidence for shoulder separation or dislocation. Bones are diffusely demineralized. Mild degeneration in the [facility] joint noted. IMPRESSION: No acute findings. Electronically Signed   By: Verda Cumins.D.  On: 03/22/2016 22:09    Procedures Procedures (including critical care time)  Medications Ordered in ED Medications  acetaminophen (TYLENOL) tablet 650 mg (650 mg Oral Given 03/22/16 2130)     Initial Impression / Assessment and Plan / ED Course  I have reviewed the triage vital signs and the nursing notes.  Pertinent labs & imaging results that were available during my care of the patient were reviewed by me and considered in my medical decision making (see chart for details).  Clinical Course    Pt in emergency dept with left sided neck pain, left shoulder pain, onset this morning. No injuries. Her pulses are intact distally. Hand is pink and warm. Strength intact of all muscles. No bruits. X-ray of the shoulder obtained and is negative. Discussed with Dr. Regenia Skeeter who has seen patient as well, degrees pain is most likely muscular skeletal. Will treat with Tylenol, patient was given a dose here and had good pain relief. Continue aspirin and Tylenol at home. Follow-up with orthopedics.  Vitals:   03/22/16 2130 03/22/16 2215 03/22/16 2230 03/22/16 2245  BP: (!) 132/113 143/67 140/63 140/70  Pulse: 65 72 66 77  Resp: '18 24 26 26    '$ Temp:      TempSrc:      SpO2: 99% 97% 98% 99%  Weight:      Height:         Final Clinical Impressions(s) / ED Diagnoses   Final diagnoses:  Acute pain of left shoulder    New Prescriptions New Prescriptions   No medications on file     Jeannett Senior, PA-C 03/22/16 [place], MD 03/22/16 2329

## 2016-03-24 ENCOUNTER — Ambulatory Visit: Payer: Medicare Other | Admitting: Physician Assistant

## 2016-03-25 ENCOUNTER — Ambulatory Visit (INDEPENDENT_AMBULATORY_CARE_PROVIDER_SITE_OTHER): Payer: Medicare Other | Admitting: Cardiology

## 2016-03-25 ENCOUNTER — Encounter: Payer: Self-pay | Admitting: Cardiovascular Disease

## 2016-03-25 ENCOUNTER — Encounter: Payer: Self-pay | Admitting: Cardiology

## 2016-03-25 DIAGNOSIS — N183 Chronic kidney disease, stage 3 unspecified: Secondary | ICD-10-CM

## 2016-03-25 DIAGNOSIS — J440 Chronic obstructive pulmonary disease with acute lower respiratory infection: Secondary | ICD-10-CM

## 2016-03-25 DIAGNOSIS — I10 Essential (primary) hypertension: Secondary | ICD-10-CM | POA: Diagnosis not present

## 2016-03-25 DIAGNOSIS — I739 Peripheral vascular disease, unspecified: Secondary | ICD-10-CM

## 2016-03-25 DIAGNOSIS — H548 Legal blindness, as defined in USA: Secondary | ICD-10-CM

## 2016-03-25 NOTE — Assessment & Plan Note (Signed)
Current smoker

## 2016-03-25 NOTE — Assessment & Plan Note (Signed)
SFA disease, Rt > Lt by angiogram 03/18/16

## 2016-03-25 NOTE — Patient Instructions (Addendum)
Medication Instructions:  Your physician recommends that you continue on your current medications as directed. Please refer to the Current Medication list given to you today.  Labwork: None   Testing/Procedures: Intervention of the Legs   Follow-Up: Your physician recommends that you schedule a follow-up appointment in: [place] DR BERRY IS IN THE OFFICE  Any Other Special Instructions Will Be Listed Below (If Applicable).  SCHEDULE INTERVENTION WITH BERRY    If you need a refill on your cardiac medications before your next appointment, please call your pharmacy.

## 2016-03-25 NOTE — Progress Notes (Signed)
03/25/2016 [name]   March 17, 1941  [id number]  Primary Physician Gilles Chiquito, MD Primary Cardiologist: Dr Gwenlyn Found  HPI:  75 year old legally blind AA female with a past medical history of HTN, ongoing tobacco abuse, HLD, COPD, CKD stage III who was referred to Dr Gwenlyn Found 02/20/16 by Dr Milinda Pointer at [facility] for LE arterial evaluation. He needs to perform surgery on her feet and noted decreased circulation on exam. LEA dopplers confirmed severe LE arterial disease and she was admitted 03/16/16 for hydration prior PV Angiogram. PV angiogram done on 03/17/16 revealed 99% proximal Lt SFA and 90% mid Lt SFA with one vessel run off. The Rt SFA was occluded at mid vessel with one vessel run off. Intervention was not done at that time secondary to CRI (SCr 1.6). She was hydrated overnight and discharged. She is  In the office today to discuss staged intervention.  Current Outpatient Prescriptions  Medication Sig Dispense Refill  . acetaminophen (TYLENOL) 500 MG tablet Take 500 mg by mouth every 6 (six) hours as needed (for pain/foot pain.).    Marland Kitchen albuterol (PROVENTIL HFA;VENTOLIN HFA) 108 (90 Base) MCG/ACT inhaler Inhale 2 puffs into the lungs every 6 (six) hours as needed for wheezing or shortness of breath. 1 Inhaler 6  . aspirin EC 81 MG EC tablet Take 1 tablet (81 mg total) by mouth daily.    . clopidogrel (PLAVIX) 75 MG tablet Take 1 tablet (75 mg total) by mouth daily with breakfast. 30 tablet 12  . hydrochlorothiazide (MICROZIDE) 12.5 MG capsule Take 1 capsule (12.5 mg total) by mouth daily. 30 capsule 6  . prednisoLONE acetate (PRED FORTE) 1 % ophthalmic suspension Place 1 drop into both eyes daily.    . Vitamin D, Cholecalciferol, 1000 units CAPS Take 1 capsule by mouth daily. 90 capsule 3   No current facility-administered medications for this visit.     Allergies  Allergen Reactions  . Codeine Other (See Comments)    REACTION: Pt c/o "feeling high"  . Morphine Other (See  Comments)    REACTION: feels funny and "high"   FM Hx:  Cancer  Social History   Social History  . Marital status: Divorced    Spouse name: N/A  . Number of children: N/A  . Years of education: N/A   Occupational History  . Not on file.   Social History Main Topics  . Smoking status: Current Every Day Smoker    Packs/day: 0.50    Years: 20.00    Types: Cigarettes  . Smokeless tobacco: Never Used     Comment: .  Marland Kitchen Alcohol use No  . Drug use: No  . Sexual activity: Not on file   Other Topics Concern  . Not on file   Social History Narrative   Patient currently lives at home alone, but does have a home worker who visits her throughout the week to help with things around the house.  Patient states that she still tries to remain active and walks daily.  She is has been divorced for sometime and has one son.  She currently lives off social security.  Patient reports using approximately half a pack of cigarettes daily for 20 years.  She denies any alcohol or illicit drug use.  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       Review of Systems: General: negative for chills, fever, night sweats or weight changes.  Cardiovascular: negative for chest pain, dyspnea on exertion, edema, orthopnea, palpitations, paroxysmal nocturnal dyspnea or shortness of breath Dermatological: negative for rash Respiratory: negative for cough or wheezing Urologic: negative for hematuria Abdominal: negative for nausea, vomiting, diarrhea, bright red blood per rectum, melena, or hematemesis Neurologic: negative for visual changes, syncope, or dizziness Her main complaint is pain in her feet when trying to walk.  All other systems reviewed and are otherwise negative except as noted above.    Blood pressure 140/76, pulse 71, height '5\' 5"'$  (1.651 m), weight 160 lb 3.2 oz (72.7 kg).  General appearance: alert, cooperative, no distress, mildly  obese and blind Neck: no carotid bruit and no JVD Lungs: decreased breath sounds c/w COPD Heart: regular rate and rhythm Extremities: bilater deformities in her toes, Trace edema Pulses: diminnished Skin: Skin color, texture, turgor normal. No rashes or lesions Neurologic: Grossly normal   ASSESSMENT AND PLAN:   PVD (peripheral vascular disease) (HCC) SFA disease, Rt > Lt by angiogram 03/18/16  Stage 3 chronic kidney disease SCr 1.3 the morning after her PVA  COPD with acute lower respiratory infection Current smoker  Essential hypertension B/P above goal.  BLINDNESS Lives alone in her own appartment   PLAN  Pt was seen by Dr Gwenlyn Found and myself in the office today.  She will be admitted for a [facility] atherectomy of her Lt SFA. Ultimately she will need a staged intervention of her RLE as well before she can have surgery on her feet.   Kerin Ransom PA-C 03/25/2016 12:17 PM

## 2016-03-25 NOTE — Assessment & Plan Note (Signed)
Lives alone in her own appartment

## 2016-03-25 NOTE — Assessment & Plan Note (Signed)
SCr 1.3 the morning after her PVA

## 2016-03-25 NOTE — Assessment & Plan Note (Signed)
B/P above goal.

## 2016-03-26 ENCOUNTER — Telehealth: Payer: Self-pay | Admitting: Cardiovascular Disease

## 2016-03-26 ENCOUNTER — Encounter: Payer: Self-pay | Admitting: Cardiology

## 2016-03-26 NOTE — Telephone Encounter (Signed)
Returning your call. °

## 2016-03-26 NOTE — Telephone Encounter (Signed)
Attempted to call pt to let her know to go to the hospital Sunday 11/26 for pre-hydration prior to procedure scheduled for 11/27. Left message on home phone--OK per DPR.  Bed control and card master notified.  Pt will have to schedule for ride on [employer] bus 24 hours in advance.  Will attempt to call again later today.

## 2016-03-26 NOTE — Telephone Encounter (Signed)
Spoke to pt. Informed her that she will need to go to the hospital on 11/26 for Pre hydration and that someone from [employer] will call her around mid-morning to afetrnoon that day. She takes the [employer] bus and will call to set up a reservation to be picked up around 1:00pm that day.

## 2016-04-06 ENCOUNTER — Encounter (HOSPITAL_COMMUNITY): Payer: Self-pay

## 2016-04-06 ENCOUNTER — Ambulatory Visit (HOSPITAL_COMMUNITY)
Admission: AD | Admit: 2016-04-06 | Discharge: 2016-04-08 | Disposition: A | Discharge status: Home / Self Care | Payer: Medicare Other | Source: Ambulatory Visit | Attending: Cardiovascular Disease | Admitting: Cardiovascular Disease

## 2016-04-06 DIAGNOSIS — I10 Essential (primary) hypertension: Secondary | ICD-10-CM | POA: Diagnosis present

## 2016-04-06 DIAGNOSIS — I739 Peripheral vascular disease, unspecified: Secondary | ICD-10-CM | POA: Diagnosis not present

## 2016-04-06 DIAGNOSIS — J449 Chronic obstructive pulmonary disease, unspecified: Secondary | ICD-10-CM | POA: Diagnosis not present

## 2016-04-06 DIAGNOSIS — F17210 Nicotine dependence, cigarettes, uncomplicated: Secondary | ICD-10-CM | POA: Insufficient documentation

## 2016-04-06 DIAGNOSIS — I129 Hypertensive chronic kidney disease with stage 1 through stage 4 chronic kidney disease, or unspecified chronic kidney disease: Secondary | ICD-10-CM | POA: Insufficient documentation

## 2016-04-06 DIAGNOSIS — H547 Unspecified visual loss: Secondary | ICD-10-CM | POA: Diagnosis not present

## 2016-04-06 DIAGNOSIS — I70213 Atherosclerosis of native arteries of extremities with intermittent claudication, bilateral legs: Secondary | ICD-10-CM | POA: Insufficient documentation

## 2016-04-06 DIAGNOSIS — E785 Hyperlipidemia, unspecified: Secondary | ICD-10-CM | POA: Diagnosis not present

## 2016-04-06 DIAGNOSIS — N183 Chronic kidney disease, stage 3 unspecified: Secondary | ICD-10-CM | POA: Diagnosis present

## 2016-04-06 DIAGNOSIS — Z79899 Other long term (current) drug therapy: Secondary | ICD-10-CM | POA: Diagnosis not present

## 2016-04-06 DIAGNOSIS — Z7902 Long term (current) use of antithrombotics/antiplatelets: Secondary | ICD-10-CM | POA: Insufficient documentation

## 2016-04-06 DIAGNOSIS — H548 Legal blindness, as defined in USA: Secondary | ICD-10-CM | POA: Diagnosis present

## 2016-04-06 HISTORY — DX: Peripheral vascular disease, unspecified: I73.9

## 2016-04-06 LAB — COMPREHENSIVE METABOLIC PANEL
ALT: 7 U/L — ABNORMAL LOW (ref 14–54)
AST: 12 U/L — AB (ref 15–41)
Albumin: 3.5 g/dL (ref 3.5–5.0)
Alkaline Phosphatase: 81 U/L (ref 38–126)
Anion gap: 8 (ref 5–15)
BILIRUBIN TOTAL: 0.4 mg/dL (ref 0.3–1.2)
BUN: 21 mg/dL — AB (ref 6–20)
CO2: 27 mmol/L (ref 22–32)
Calcium: 9.3 mg/dL (ref 8.9–10.3)
Chloride: 101 mmol/L (ref 101–111)
Creatinine, Ser: 1.44 mg/dL — ABNORMAL HIGH (ref 0.44–1.00)
GFR, EST AFRICAN AMERICAN: 40 mL/min — AB (ref >60)
GFR, EST NON AFRICAN AMERICAN: 35 mL/min — AB (ref >60)
Glucose, Bld: 103 mg/dL — ABNORMAL HIGH (ref 65–99)
POTASSIUM: 3.8 mmol/L (ref 3.5–5.1)
Sodium: 136 mmol/L (ref 135–145)
TOTAL PROTEIN: 6.3 g/dL — AB (ref 6.5–8.1)

## 2016-04-06 MED ORDER — SODIUM CHLORIDE 0.9 % IV SOLN
INTRAVENOUS | Status: DC
  Administered 2016-04-06: 17:00:00 via INTRAVENOUS

## 2016-04-06 MED ORDER — ALBUTEROL SULFATE HFA 108 (90 BASE) MCG/ACT IN AERS: 2.0000 | INHALATION_SPRAY | Freq: Four times a day (QID) | RESPIRATORY_TRACT | Status: DC | PRN

## 2016-04-06 MED ORDER — ASPIRIN 81 MG PO TBEC: 81.0000 mg | DELAYED_RELEASE_TABLET | Freq: Every day | ORAL | Status: DC

## 2016-04-06 MED ORDER — PREDNISOLONE ACETATE 1 % OP SUSP
1.0000 [drp] | Freq: Every day | OPHTHALMIC | Status: DC
  Administered 2016-04-07 – 2016-04-08 (×2): 1 [drp] via OPHTHALMIC
  Filled 2016-04-06 (×2): qty 1

## 2016-04-06 MED ORDER — ACETAMINOPHEN 325 MG PO TABS
650.0000 mg | ORAL_TABLET | ORAL | Status: DC | PRN
  Administered 2016-04-06 – 2016-04-07 (×3): 650 mg via ORAL
  Filled 2016-04-06 (×3): qty 2

## 2016-04-06 MED ORDER — ASPIRIN EC 81 MG PO TBEC
81.0000 mg | DELAYED_RELEASE_TABLET | Freq: Every day | ORAL | Status: DC
  Administered 2016-04-06 – 2016-04-08 (×3): 81 mg via ORAL
  Filled 2016-04-06 (×3): qty 1

## 2016-04-06 MED ORDER — NICOTINE 14 MG/24HR TD PT24
14.0000 mg | MEDICATED_PATCH | Freq: Every day | TRANSDERMAL | Status: DC
  Administered 2016-04-06 – 2016-04-08 (×3): 14 mg via TRANSDERMAL
  Filled 2016-04-06 (×3): qty 1

## 2016-04-06 MED ORDER — CLOPIDOGREL BISULFATE 75 MG PO TABS
75.0000 mg | ORAL_TABLET | Freq: Every day | ORAL | Status: DC
  Administered 2016-04-07 – 2016-04-08 (×2): 75 mg via ORAL
  Filled 2016-04-06 (×2): qty 1

## 2016-04-06 MED ORDER — SODIUM CHLORIDE 0.9 % WEIGHT BASED INFUSION
1.0000 mL/kg/h | INTRAVENOUS | Status: DC
  Administered 2016-04-07: 1 mL/kg/h via INTRAVENOUS

## 2016-04-06 MED ORDER — INFLUENZA VAC SPLIT QUAD 0.5 ML IM SUSY: 0.5000 mL | PREFILLED_SYRINGE | INTRAMUSCULAR | Status: DC

## 2016-04-06 MED ORDER — ONDANSETRON HCL 4 MG/2ML IJ SOLN: 4.0000 mg | Freq: Four times a day (QID) | INTRAMUSCULAR | Status: DC | PRN

## 2016-04-06 MED ORDER — ALBUTEROL SULFATE (2.5 MG/3ML) 0.083% IN NEBU: 2.5000 mg | INHALATION_SOLUTION | Freq: Four times a day (QID) | RESPIRATORY_TRACT | Status: DC | PRN

## 2016-04-06 MED ORDER — SODIUM CHLORIDE 0.9 % WEIGHT BASED INFUSION: 3.0000 mL/kg/h | INTRAVENOUS | Status: DC

## 2016-04-06 NOTE — H&P (Signed)
Cardiology       03/25/2016 [name]   1941/04/01  [id number]  Primary Physician Gilles Chiquito, MD Primary Cardiologist: Dr Gwenlyn Found  HPI:  75 year old legally blind AA female with a past medical history of HTN, ongoing tobacco abuse, HLD, COPD, CKD stage III who was referred to Dr Gwenlyn Found 02/20/16 by Dr Milinda Pointer at [facility] for LE arterial evaluation. He needs to perform surgery on her feet and noted decreased circulation on exam. LEA dopplers confirmed severe LE arterial disease and she was admitted 11/5/17for hydration prior PV Angiogram. PV angiogram done on 03/17/16 revealed 99% proximal Lt SFA and 90% mid Lt SFA with one vessel run off. The Rt SFA was occluded at mid vessel with one vessel run off. Intervention was not done at that time secondary to CRI (SCr 1.6). She was hydrated overnight and discharged. She presented back to the office on 03/25/16 and saw Lurena Joiner for follow up and discussion regarding staged intervention.        Current Outpatient Prescriptions  Medication Sig Dispense Refill  . acetaminophen (TYLENOL) 500 MG tablet Take 500 mg by mouth every 6 (six) hours as needed (for pain/foot pain.).    Marland Kitchen albuterol (PROVENTIL HFA;VENTOLIN HFA) 108 (90 Base) MCG/ACT inhaler Inhale 2 puffs into the lungs every 6 (six) hours as needed for wheezing or shortness of breath. 1 Inhaler 6  . aspirin EC 81 MG EC tablet Take 1 tablet (81 mg total) by mouth daily.    . clopidogrel (PLAVIX) 75 MG tablet Take 1 tablet (75 mg total) by mouth daily with breakfast. 30 tablet 12  . hydrochlorothiazide (MICROZIDE) 12.5 MG capsule Take 1 capsule (12.5 mg total) by mouth daily. 30 capsule 6  . prednisoLONE acetate (PRED FORTE) 1 % ophthalmic suspension Place 1 drop into both eyes daily.    . Vitamin D, Cholecalciferol, 1000 units CAPS Take 1 capsule by mouth daily. 90 capsule 3   No current facility-administered medications for this visit.          Allergies  Allergen  Reactions  . Codeine Other (See Comments)    REACTION: Pt c/o "feeling high"  . Morphine Other (See Comments)    REACTION: feels funny and "high"   FM Hx:  Cancer  Social History        Social History  . Marital status: Divorced    Spouse name: N/A  . Number of children: N/A  . Years of education: N/A      Occupational History  . Not on file.         Social History Main Topics  . Smoking status: Current Every Day Smoker    Packs/day: 0.50    Years: 20.00    Types: Cigarettes  . Smokeless tobacco: Never Used     Comment: .  Marland Kitchen Alcohol use No  . Drug use: No  . Sexual activity: Not on file       Other Topics Concern  . Not on file      Social History Narrative   Patient currently lives at home alone, but does have a home worker who visits her throughout the week to help with things around the house.  Patient states that she still tries to remain active and walks daily.  She is has been divorced for sometime and has one son.  She currently lives off social security.  Patient reports using approximately half a pack of cigarettes daily for 20 years.  She denies  any alcohol or illicit drug use.  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       Review of Systems: General: negative for chills, fever, night sweats or weight changes.  Cardiovascular: negative for chest pain, dyspnea on exertion, edema, orthopnea, palpitations, paroxysmal nocturnal dyspnea or shortness of breath Dermatological: negative for rash Respiratory: negative for cough or wheezing Urologic: negative for hematuria Abdominal: negative for nausea, vomiting, diarrhea, bright red blood per rectum, melena, or hematemesis Neurologic: negative for visual changes, syncope, or dizziness Her main complaint is pain in her feet when trying to walk.  All other systems reviewed and are otherwise negative except as noted  above.    Blood pressure 140/76, pulse 71, height '5\' 5"'$  (1.651 m), weight 160 lb 3.2 oz (72.7 kg).  General appearance: alert, cooperative, no distress, mildly obese and blind Neck: no carotid bruit and no JVD Lungs: decreased breath sounds c/w COPD Heart: regular rate and rhythm Extremities: bilater deformities in her toes, Trace edema Pulses: diminnished Skin: Skin color, texture, turgor normal. No rashes or lesions Neurologic: Grossly normal   ASSESSMENT AND PLAN:   PVD (peripheral vascular disease) (HCC) SFA disease, Rt > Lt by angiogram 03/18/16  Stage 3 chronic kidney disease SCr 1.3 on 03/18/16. Check BMET  COPD with acute lower respiratory infection Current smoker  Essential hypertension - Slightly elevated on admission. Hold home HCTZ given slightly elevated renal function and plan for cath tomorrow.   BLINDNESS Lives alone in her own appartment   PLAN  Pt was seen by Dr Gwenlyn Found and Lurena Joiner in the office. She is planned for staged intervention for RLE tomorrow. Being admitted today for prehydration.      Reino Bellis NP-C  I have seen and examined this patient with Reino Bellis.  Agree with above, note added to reflect my findings.  On exam, regular rhythm, no murmurs, lungs clear. Admission for hydration prior to RLE intervention by [name] tomorrow.  Patient without complaint. Antwon Rochin plan for hydration through the evening.    Brelee Renk M. Lylla Eifler MD 04/06/2016 2:54 PM

## 2016-04-07 ENCOUNTER — Encounter (HOSPITAL_COMMUNITY)
Admission: AD | Disposition: A | Discharge status: Home / Self Care | Payer: Self-pay | Source: Ambulatory Visit | Attending: Cardiovascular Disease

## 2016-04-07 ENCOUNTER — Encounter (HOSPITAL_COMMUNITY): Payer: Self-pay | Admitting: Cardiovascular Disease

## 2016-04-07 DIAGNOSIS — J449 Chronic obstructive pulmonary disease, unspecified: Secondary | ICD-10-CM | POA: Diagnosis not present

## 2016-04-07 DIAGNOSIS — I70213 Atherosclerosis of native arteries of extremities with intermittent claudication, bilateral legs: Secondary | ICD-10-CM | POA: Diagnosis not present

## 2016-04-07 DIAGNOSIS — I129 Hypertensive chronic kidney disease with stage 1 through stage 4 chronic kidney disease, or unspecified chronic kidney disease: Secondary | ICD-10-CM | POA: Diagnosis not present

## 2016-04-07 DIAGNOSIS — I70212 Atherosclerosis of native arteries of extremities with intermittent claudication, left leg: Secondary | ICD-10-CM | POA: Diagnosis not present

## 2016-04-07 DIAGNOSIS — Z79899 Other long term (current) drug therapy: Secondary | ICD-10-CM | POA: Diagnosis not present

## 2016-04-07 DIAGNOSIS — N183 Chronic kidney disease, stage 3 (moderate): Secondary | ICD-10-CM | POA: Diagnosis not present

## 2016-04-07 DIAGNOSIS — Z7902 Long term (current) use of antithrombotics/antiplatelets: Secondary | ICD-10-CM | POA: Diagnosis not present

## 2016-04-07 DIAGNOSIS — E785 Hyperlipidemia, unspecified: Secondary | ICD-10-CM | POA: Diagnosis not present

## 2016-04-07 DIAGNOSIS — H547 Unspecified visual loss: Secondary | ICD-10-CM | POA: Diagnosis not present

## 2016-04-07 HISTORY — PX: PERIPHERAL VASCULAR CATHETERIZATION: SHX172C

## 2016-04-07 LAB — CBC
HCT: 36 % (ref 36.0–46.0)
Hemoglobin: 11.7 g/dL — ABNORMAL LOW (ref 12.0–15.0)
MCH: 31.4 pg (ref 26.0–34.0)
MCHC: 32.5 g/dL (ref 30.0–36.0)
MCV: 96.5 fL (ref 78.0–100.0)
Platelets: 202 10*3/uL (ref 150–400)
RBC: 3.73 MIL/uL — ABNORMAL LOW (ref 3.87–5.11)
RDW: 14.1 % (ref 11.5–15.5)
WBC: 5.4 10*3/uL (ref 4.0–10.5)

## 2016-04-07 LAB — POCT ACTIVATED CLOTTING TIME
ACTIVATED CLOTTING TIME: 180 s
Activated Clotting Time: 246 seconds
Activated Clotting Time: 274 seconds
Activated Clotting Time: 208 seconds
Activated Clotting Time: 175 seconds

## 2016-04-07 LAB — BASIC METABOLIC PANEL
Anion gap: 4 — ABNORMAL LOW (ref 5–15)
BUN: 19 mg/dL (ref 6–20)
CHLORIDE: 108 mmol/L (ref 101–111)
CO2: 27 mmol/L (ref 22–32)
Calcium: 8.8 mg/dL — ABNORMAL LOW (ref 8.9–10.3)
Creatinine, Ser: 1.28 mg/dL — ABNORMAL HIGH (ref 0.44–1.00)
GFR calc Af Amer: 46 mL/min — ABNORMAL LOW (ref >60)
GFR calc non Af Amer: 40 mL/min — ABNORMAL LOW (ref >60)
GLUCOSE: 96 mg/dL (ref 65–99)
POTASSIUM: 4 mmol/L (ref 3.5–5.1)
Sodium: 139 mmol/L (ref 135–145)

## 2016-04-07 LAB — PROTIME-INR
INR: 1.06
Prothrombin Time: 13.9 seconds (ref 11.4–15.2)

## 2016-04-07 SURGERY — LOWER EXTREMITY ANGIOGRAPHY

## 2016-04-07 SURGERY — LOWER EXTREMITY ANGIOGRAPHY
Anesthesia: LOCAL | Laterality: Left

## 2016-04-07 MED ORDER — LIDOCAINE HCL (PF) 1 % IJ SOLN
INTRAMUSCULAR | Status: AC
  Filled 2016-04-07: qty 30

## 2016-04-07 MED ORDER — HEPARIN SODIUM (PORCINE) 1000 UNIT/ML IJ SOLN
INTRAMUSCULAR | Status: AC
  Filled 2016-04-07: qty 1

## 2016-04-07 MED ORDER — FENTANYL CITRATE (PF) 100 MCG/2ML IJ SOLN
INTRAMUSCULAR | Status: AC
  Filled 2016-04-07: qty 2

## 2016-04-07 MED ORDER — HEPARIN (PORCINE) IN NACL 2-0.9 UNIT/ML-% IJ SOLN
INTRAMUSCULAR | Status: DC | PRN
  Administered 2016-04-07: 1000 mL

## 2016-04-07 MED ORDER — HEPARIN SODIUM (PORCINE) 1000 UNIT/ML IJ SOLN
INTRAMUSCULAR | Status: DC | PRN
  Administered 2016-04-07: 3500 [IU] via INTRAVENOUS
  Administered 2016-04-07: 6000 [IU] via INTRAVENOUS

## 2016-04-07 MED ORDER — FENTANYL CITRATE (PF) 100 MCG/2ML IJ SOLN
INTRAMUSCULAR | Status: DC | PRN
  Administered 2016-04-07: 25 ug via INTRAVENOUS

## 2016-04-07 MED ORDER — ASPIRIN EC 325 MG PO TBEC
325.0000 mg | DELAYED_RELEASE_TABLET | Freq: Every day | ORAL | Status: DC
Start: 2016-04-07 — End: 2016-04-07

## 2016-04-07 MED ORDER — HYDRALAZINE HCL 20 MG/ML IJ SOLN: 10.0000 mg | INTRAMUSCULAR | Status: DC | PRN

## 2016-04-07 MED ORDER — HYDRALAZINE HCL 20 MG/ML IJ SOLN
INTRAMUSCULAR | Status: DC | PRN
  Administered 2016-04-07: 10 mg via INTRAVENOUS

## 2016-04-07 MED ORDER — ONDANSETRON HCL 4 MG/2ML IJ SOLN
4.0000 mg | Freq: Four times a day (QID) | INTRAMUSCULAR | Status: DC | PRN
  Administered 2016-04-07: 17:00:00 4 mg via INTRAVENOUS
  Filled 2016-04-07: qty 2

## 2016-04-07 MED ORDER — ANGIOPLASTY BOOK
Freq: Once | Status: AC
  Administered 2016-04-07: 20:00:00
  Filled 2016-04-07: qty 1

## 2016-04-07 MED ORDER — ACETAMINOPHEN 325 MG PO TABS
650.0000 mg | ORAL_TABLET | ORAL | Status: DC | PRN
  Administered 2016-04-07 – 2016-04-08 (×2): 650 mg via ORAL
  Filled 2016-04-07 (×2): qty 2

## 2016-04-07 MED ORDER — HYDRALAZINE HCL 20 MG/ML IJ SOLN
INTRAMUSCULAR | Status: AC
  Filled 2016-04-07: qty 1

## 2016-04-07 MED ORDER — HYDRALAZINE HCL 20 MG/ML IJ SOLN
10.0000 mg | INTRAMUSCULAR | Status: DC | PRN
  Administered 2016-04-07: 16:00:00 10 mg via INTRAVENOUS
  Filled 2016-04-07: qty 1

## 2016-04-07 MED ORDER — CLOPIDOGREL BISULFATE 75 MG PO TABS: 75.0000 mg | ORAL_TABLET | Freq: Every day | ORAL | Status: DC

## 2016-04-07 MED ORDER — HEPARIN (PORCINE) IN NACL 2-0.9 UNIT/ML-% IJ SOLN
INTRAMUSCULAR | Status: AC
  Filled 2016-04-07: qty 1000

## 2016-04-07 MED ORDER — TRAMADOL HCL 50 MG PO TABS
50.0000 mg | ORAL_TABLET | Freq: Once | ORAL | Status: AC
  Administered 2016-04-07: 50 mg via ORAL
  Filled 2016-04-07: qty 1

## 2016-04-07 SURGICAL SUPPLY — 21 items
BALLN IN.PACT DCB 5X40 (BALLOONS) ×3
BALLN LUTONIX DCB 5X40X130 (BALLOONS) ×3
BALLOON LUTONIX DCB 5X40X130 (BALLOONS) IMPLANT
CATH HAWKONE LS STANDARD TIP (CATHETERS) ×3
CATH HAWKONE LS STD TIP (CATHETERS) IMPLANT
CATH SOS OMNI O 5F 80CM (CATHETERS) ×1 IMPLANT
DCB IN.PACT 5X40 (BALLOONS) IMPLANT
DEVICE SPIDERFX EMB PROT 6MM (WIRE) ×1 IMPLANT
KIT ENCORE 26 ADVANTAGE (KITS) ×1 IMPLANT
KIT PV (KITS) ×3 IMPLANT
SHEATH HIGHFLEX ANSEL 7FR 55CM (SHEATH) ×1 IMPLANT
SHEATH PINNACLE 5F 10CM (SHEATH) ×1 IMPLANT
SHEATH PINNACLE 7F 10CM (SHEATH) ×1 IMPLANT
STOPCOCK MORSE 400PSI 3WAY (MISCELLANEOUS) ×1 IMPLANT
SYR MEDRAD MARK V 150ML (SYRINGE) ×3 IMPLANT
TAPE RADIOPAQUE TURBO (MISCELLANEOUS) ×1 IMPLANT
TRANSDUCER W/STOPCOCK (MISCELLANEOUS) ×3 IMPLANT
TRAY PV CATH (CUSTOM PROCEDURE TRAY) ×3 IMPLANT
TUBING CIL FLEX 10 FLL-RA (TUBING) ×1 IMPLANT
WIRE HITORQ VERSACORE ST 145CM (WIRE) ×1 IMPLANT
WIRE SPARTACORE .014X300CM (WIRE) ×1 IMPLANT

## 2016-04-07 NOTE — Care Management Note (Signed)
Case Management Note  Patient Details  Name: [name] MRN: [id number] Date of Birth: 1941/01/18  Subjective/Objective:   S/p pv intervention, already on plavix and asa, NCM will cont to follow for dc needs.                  Action/Plan:   Expected Discharge Date:                  Expected Discharge Plan:  Home/Self Care  In-House Referral:     Discharge planning Services  CM Consult  Post Acute Care Choice:    Choice offered to:     DME Arranged:    DME Agency:     HH Arranged:    HH Agency:     Status of Service:  In process, will continue to follow  If discussed at Long Length of Stay Meetings, dates discussed:    Additional Comments:  Zenon Mayo, RN 04/07/2016, 2:07 PM

## 2016-04-07 NOTE — Interval H&P Note (Signed)
History and Physical Interval Note:  04/07/2016 10:08 AM  [name]  has presented today for surgery, with the diagnosis of pad  The various methods of treatment have been discussed with the patient and family. After consideration of risks, benefits and other options for treatment, the patient has consented to  Procedure(s): Lower Extremity Angiography (N/A) as a surgical intervention .  [name] history has been reviewed, patient examined, no change in status, stable for surgery.  I have reviewed the patient's chart and labs.  Questions were answered to the patient's satisfaction.     [name]

## 2016-04-07 NOTE — Progress Notes (Signed)
Site area: right groin  Site Prior to Removal:  Level 0  Pressure Applied For 20 MINUTES    Minutes Beginning at 1620  Manual:   Yes.    Patient Status During Pull:  AAO X 4  Post Pull Groin Site:  Level 0  Post Pull Instructions Given:  Yes.    Post Pull Pulses Present:  Yes.    Dressing Applied:  Yes.    Comments:   TOLERATED PROCEDURE WELL

## 2016-04-08 ENCOUNTER — Encounter (HOSPITAL_COMMUNITY): Payer: Self-pay | Admitting: Nurse Practitioner

## 2016-04-08 DIAGNOSIS — I70213 Atherosclerosis of native arteries of extremities with intermittent claudication, bilateral legs: Secondary | ICD-10-CM | POA: Diagnosis not present

## 2016-04-08 DIAGNOSIS — E785 Hyperlipidemia, unspecified: Secondary | ICD-10-CM | POA: Diagnosis not present

## 2016-04-08 DIAGNOSIS — J449 Chronic obstructive pulmonary disease, unspecified: Secondary | ICD-10-CM | POA: Diagnosis not present

## 2016-04-08 DIAGNOSIS — H547 Unspecified visual loss: Secondary | ICD-10-CM | POA: Diagnosis not present

## 2016-04-08 DIAGNOSIS — N183 Chronic kidney disease, stage 3 (moderate): Secondary | ICD-10-CM | POA: Diagnosis not present

## 2016-04-08 DIAGNOSIS — I10 Essential (primary) hypertension: Secondary | ICD-10-CM | POA: Diagnosis not present

## 2016-04-08 DIAGNOSIS — I739 Peripheral vascular disease, unspecified: Secondary | ICD-10-CM | POA: Diagnosis not present

## 2016-04-08 DIAGNOSIS — Z7902 Long term (current) use of antithrombotics/antiplatelets: Secondary | ICD-10-CM | POA: Diagnosis not present

## 2016-04-08 DIAGNOSIS — Z79899 Other long term (current) drug therapy: Secondary | ICD-10-CM | POA: Diagnosis not present

## 2016-04-08 DIAGNOSIS — I129 Hypertensive chronic kidney disease with stage 1 through stage 4 chronic kidney disease, or unspecified chronic kidney disease: Secondary | ICD-10-CM | POA: Diagnosis not present

## 2016-04-08 LAB — BASIC METABOLIC PANEL
ANION GAP: 6 (ref 5–15)
BUN: 16 mg/dL (ref 6–20)
CHLORIDE: 107 mmol/L (ref 101–111)
CO2: 25 mmol/L (ref 22–32)
Calcium: 9.1 mg/dL (ref 8.9–10.3)
Creatinine, Ser: 1.29 mg/dL — ABNORMAL HIGH (ref 0.44–1.00)
GFR calc non Af Amer: 39 mL/min — ABNORMAL LOW (ref >60)
GFR, EST AFRICAN AMERICAN: 46 mL/min — AB (ref >60)
Glucose, Bld: 85 mg/dL (ref 65–99)
POTASSIUM: 4.2 mmol/L (ref 3.5–5.1)
Sodium: 138 mmol/L (ref 135–145)

## 2016-04-08 LAB — CBC
HEMATOCRIT: 37.4 % (ref 36.0–46.0)
HEMOGLOBIN: 12.3 g/dL (ref 12.0–15.0)
MCH: 31.7 pg (ref 26.0–34.0)
MCHC: 32.9 g/dL (ref 30.0–36.0)
MCV: 96.4 fL (ref 78.0–100.0)
Platelets: 222 10*3/uL (ref 150–400)
RBC: 3.88 MIL/uL (ref 3.87–5.11)
RDW: 14.1 % (ref 11.5–15.5)
WBC: 6.6 10*3/uL (ref 4.0–10.5)

## 2016-04-08 MED FILL — Lidocaine HCl Local Preservative Free (PF) Inj 1%: INTRAMUSCULAR | Qty: 30 | Status: AC

## 2016-04-08 NOTE — Discharge Instructions (Signed)
**  PLEASE REMEMBER TO BRING ALL OF YOUR MEDICATIONS TO EACH OF YOUR FOLLOW-UP OFFICE VISITS.  NO HEAVY LIFTING OR SEXUAL ACTIVITY X 7 DAYS. NO DRIVING X 3-5 DAYS. NO SOAKING BATHS, HOT TUBS, POOLS, ETC., X 7 DAYS.  Groin Site Care Refer to this sheet in the next few weeks. These instructions provide you with information on caring for yourself after your procedure. Your caregiver may also give you more specific instructions. Your treatment has been planned according to current medical practices, but problems sometimes occur. Call your caregiver if you have any problems or questions after your procedure. HOME CARE INSTRUCTIONS  You may shower 24 hours after the procedure. Remove the bandage (dressing) and gently wash the site with plain soap and water. Gently pat the site dry.   Do not apply powder or lotion to the site.   Do not sit in a bathtub, swimming pool, or whirlpool for 5 to 7 days.   No bending, squatting, or lifting anything over 10 pounds (4.5 kg) as directed by your caregiver.   Inspect the site at least twice daily.   Do not drive home if you are discharged the same day of the procedure. Have someone else drive you.   What to expect:  Any bruising will usually fade within 1 to 2 weeks.   Blood that collects in the tissue (hematoma) may be painful to the touch. It should usually decrease in size and tenderness within 1 to 2 weeks.  SEEK IMMEDIATE MEDICAL CARE IF:  You have unusual pain at the groin site or down the affected leg.   You have redness, warmth, swelling, or pain at the groin site.   You have drainage (other than a small amount of blood on the dressing).   You have chills.   You have a fever or persistent symptoms for more than 72 hours.   You have a fever and your symptoms suddenly get worse.   Your leg becomes pale, cool, tingly, or numb.  You have heavy bleeding from the site. Hold pressure on the site. . _____________     10 Habits of Highly  Healthy People  [facility] wants to help you get well and stay well.  Live a longer, healthier life by practicing healthy habits every day.  1.  Visit your primary care provider regularly. 2.  Make time for family and friends.  Healthy relationships are important. 3.  Take medications as directed by your provider. 4.  Maintain a healthy weight and a trim waistline. 5.  Eat healthy meals and snacks, rich in fruits, vegetables, whole grains, and lean proteins. 6.  Get moving every day - aim for 150 minutes of moderate physical activity each week. 7.  Don't smoke. 8.  Avoid alcohol or drink in moderation. 9.  Manage stress through meditation or mindful relaxation. 10.  Get seven to nine hours of quality sleep each night.  Want more information on healthy habits?  To learn more about these and other healthy habits, visit [email or web address]. _____________     

## 2016-04-08 NOTE — Discharge Summary (Signed)
Discharge Summary    Patient ID: [name],  MRN: [id number], DOB/AGE: 1940/10/05 75 y.o.  Admit date: 04/06/2016 Discharge date: 04/08/2016  Primary Care Provider: Gilles Chiquito Primary Cardiologist: Adora Fridge, MD   Discharge Diagnoses    Principal Problem:   Claudication in peripheral vascular disease ([place])  **s/p atherectomy and drug eluting balloon angioplasty to the Left SFA.  **Pending staged PTA to the R SFA.  Active Problems:   Peripheral arterial disease (HCC)   Hyperlipidemia   Essential hypertension   CKD (chronic kidney disease) stage 3, GFR 30-59 ml/min   BLINDNESS   Tobacco Abuse  Allergies Allergies  Allergen Reactions  . Codeine Other (See Comments)    REACTION: Pt c/o "feeling high"  . Morphine Other (See Comments)    REACTION: feels funny and "high"    Diagnostic Studies/Procedures    Peripheral Angiography with Percutaneous Transluminal Angioplasty 11.27.2017   Successful proximal and distal left SFA [place] 1 directional atherectomy followed by drug eluting balloon angioplasty using spider distal protection with one-vessel runoff for symptomatic claudication and for preoperative for revascularization prior to foot surgery. _____________   History of Present Illness     75 year old legally blind AA female with a past medical history of HTN, ongoing tobacco abuse, HLD, COPD, CKD stage III who was referred to Dr Gwenlyn Found 02/20/16 by Dr. Milinda Pointer at [facility] for LE arterial evaluation. She has chronic bilateral foot pain and is pending surgery.  Preoperatively, she was noted to have poor lower extremity circulation and she was referred to [name] for [facility] evaluation.  Lower extremity arterial dopplers confirmed severe LE arterial disease and she was admitted 03/16/16 for hydration prior PV Angiogram. PV angiogram done on 03/17/16 revealed 99% proximal Lt SFA and 90% mid Lt SFA with one vessel run off. The Rt SFA was occluded at mid vessel with one  vessel run off. Intervention was not done at that time secondary to CKD III (SCr 1.6). She was hydrated overnight and discharged. She presented back to the office on 03/25/16 and saw Kerin Ransom, PA-C, and arrangements were made for PTA of the L SFA.    Hospital Course     Consultants: None   Due to CKD III, Ms. Wrubel was admitted to [facility] on 11/26 for elective pre-procedure hydration.  Creat was stable the morning of 11/27 and she underwent peripheral angiography with directional atherectomy and drug eluting balloon angioplasty of the Left SFA.  She tolerated the procedure well and post-procedure has been ambulating without recurrence of left LE claudication.  Creat is stable at 1.29 this AM.  She will be discharged home today in good condition and will follow-up in the office on 12/6.  We anticipate that if she is stable, she may be scheduled for staged PTA of the R SFA on 12/11 with [name], with elective admission for hydration the day prior.  Further, we plan to re-evaluate distal flow of both lower extremities via duplex following the R SFA procedure. _____________  Discharge Vitals Blood pressure (!) 142/78, pulse 95, temperature 97 F (36.1 C), temperature source Oral, resp. rate 20, height '5\' 5"'$  (1.651 m), weight 154 lb 5.2 oz (70 kg), SpO2 94 %.  Filed Weights   04/06/16 1327 04/06/16 1854 04/08/16 0453  Weight: 154 lb 8.7 oz (70.1 kg) 156 lb (70.8 kg) 154 lb 5.2 oz (70 kg)   General appearance: alert, cooperative, no distress, mildly obese and blind HEENT:  blind, otw nl. Neck: no carotid bruit and no JVD Lungs: decreased breath sounds w/ scattered rhonchi throughout.   Heart: regular rate and rhythm, no murmurs/rubs.  Trace bilat LE edema w/ diminished bilateral PT/DP pulses. Extremities: bilater deformities in her toes. Skin: Skin color, texture, turgor normal. No rashes or lesions Neurologic: Grossly normal  Labs & Radiologic Studies    CBC  Recent Labs   04/07/16 0847 04/08/16 0208  WBC 5.4 6.6  HGB 11.7* 12.3  HCT 36.0 37.4  MCV 96.5 96.4  PLT 202 202   Basic Metabolic Panel  Recent Labs  04/07/16 0847 04/08/16 0208  NA 139 138  K 4.0 4.2  CL 108 107  CO2 27 25  GLUCOSE 96 85  BUN 19 16  CREATININE 1.28* 1.29*  CALCIUM 8.8* 9.1   Liver Function Tests  Recent Labs  04/06/16 1436  AST 12*  ALT 7*  ALKPHOS 81  BILITOT 0.4  PROT 6.3*  ALBUMIN 3.5  _____________  Dg Shoulder Left  Result Date: 03/22/2016 CLINICAL DATA:  Left-sided neck pain this morning that worked it's way down to the left shoulder. No known injury. EXAM: LEFT SHOULDER - 2+ VIEW COMPARISON:  09/02/2012 FINDINGS: No evidence for fracture. No evidence for shoulder separation or dislocation. Bones are diffusely demineralized. Mild degeneration in the [facility] joint noted. IMPRESSION: No acute findings. Electronically Signed   By: Misty Stanley M.D.   On: 03/22/2016 22:09   Disposition   Pt is being discharged home today in good condition.  Follow-up Plans & Appointments    Follow-up Information    Kerin Ransom, [place] Follow up on 04/16/2016.   Specialties:  Cardiology, Radiology Why:  11:00 AM  -  Dr. Kennon Holter PA Contact information: [address] [place] [address] [phone]            Discharge Medications   Current Discharge Medication List    CONTINUE these medications which have NOT CHANGED   Details  acetaminophen (TYLENOL) 500 MG tablet Take 500-1,000 mg by mouth every 6 (six) hours as needed (for pain/foot pain.).     albuterol (PROVENTIL HFA;VENTOLIN HFA) 108 (90 Base) MCG/ACT inhaler Inhale 2 puffs into the lungs every 6 (six) hours as needed for wheezing or shortness of breath. Qty: 1 Inhaler, Refills: 6    aspirin EC 81 MG EC tablet Take 1 tablet (81 mg total) by mouth daily.    clopidogrel (PLAVIX) 75 MG tablet Take 1 tablet (75 mg total) by mouth daily with breakfast. Qty: 30 tablet, Refills: 12     hydrochlorothiazide (MICROZIDE) 12.5 MG capsule Take 1 capsule (12.5 mg total) by mouth daily. Qty: 30 capsule, Refills: 6    prednisoLONE acetate (PRED FORTE) 1 % ophthalmic suspension Place 1 drop into both eyes daily.    Vitamin D, Cholecalciferol, 1000 units CAPS Take 1 capsule by mouth daily. Qty: 90 capsule, Refills: 3          Outstanding Labs/Studies   Follow-up   Duration of Discharge Encounter   Greater than 30 minutes including physician time.  Signed, Murray Hodgkins NP 04/08/2016, 8:10 AM   Patient seen and examined and history reviewed. Agree with above findings and plan. Patient feels great. No leg pain. Right groin without hematoma. Renal function is stable. OK for DC today. Plan as noted above.  Peter Martinique, [place] 04/08/2016 9:04 AM

## 2016-04-08 NOTE — Care Management Note (Signed)
Case Management Note  Patient Details  Name: [name] MRN: [id number] Date of Birth: [birth year]  Subjective/Objective:  s/p pv intervention, already on plavix and asa, patient aide is here to transport her home today, patient has aide every day for 4 hours a day with Janeece Riggers , also her son comes by 3 days out of the week to check on her.  Right now the aides help her with her medications at home, patient states she does not have a need for [place] or HHPT.  NCM aske if she would like a rolling walker, patient states she does not want the kind we have here, she wants to order a particular kind out of a book she has.  NCM informed aide and patient if she changes her mind she can get her pcp to order this for her.  She is for dc today.                   Action/Plan:   Expected Discharge Date:                  Expected Discharge Plan:  Home/Self Care  In-House Referral:     Discharge planning Services  CM Consult  Post Acute Care Choice:    Choice offered to:     DME Arranged:    DME Agency:     HH Arranged:    HH Agency:     Status of Service:     If discussed at [employer] of Stay Meetings, dates discussed:    Additional Comments:  Zenon Mayo, RN 04/08/2016, 10:09 AM

## 2016-04-14 ENCOUNTER — Other Ambulatory Visit: Payer: Self-pay | Admitting: Cardiovascular Disease

## 2016-04-14 DIAGNOSIS — I739 Peripheral vascular disease, unspecified: Secondary | ICD-10-CM

## 2016-04-16 ENCOUNTER — Ambulatory Visit (INDEPENDENT_AMBULATORY_CARE_PROVIDER_SITE_OTHER): Payer: Medicare Other | Admitting: Cardiology

## 2016-04-16 ENCOUNTER — Ambulatory Visit (HOSPITAL_COMMUNITY)
Admission: RE | Admit: 2016-04-16 | Discharge: 2016-04-16 | Disposition: A | Discharge status: Home / Self Care | Payer: Medicare Other | Source: Ambulatory Visit | Attending: Cardiovascular Disease | Admitting: Cardiovascular Disease

## 2016-04-16 ENCOUNTER — Encounter: Payer: Self-pay | Admitting: Cardiovascular Disease

## 2016-04-16 ENCOUNTER — Encounter: Payer: Self-pay | Admitting: Cardiology

## 2016-04-16 VITALS — BP 130/80 | HR 60 | Ht 65.0 in | Wt 158.8 lb

## 2016-04-16 DIAGNOSIS — Z9889 Other specified postprocedural states: Secondary | ICD-10-CM | POA: Diagnosis not present

## 2016-04-16 DIAGNOSIS — D689 Coagulation defect, unspecified: Secondary | ICD-10-CM | POA: Diagnosis not present

## 2016-04-16 DIAGNOSIS — J440 Chronic obstructive pulmonary disease with acute lower respiratory infection: Secondary | ICD-10-CM

## 2016-04-16 DIAGNOSIS — I743 Embolism and thrombosis of arteries of the lower extremities: Secondary | ICD-10-CM | POA: Insufficient documentation

## 2016-04-16 DIAGNOSIS — I739 Peripheral vascular disease, unspecified: Secondary | ICD-10-CM | POA: Insufficient documentation

## 2016-04-16 DIAGNOSIS — M79671 Pain in right foot: Secondary | ICD-10-CM

## 2016-04-16 DIAGNOSIS — M79672 Pain in left foot: Secondary | ICD-10-CM

## 2016-04-16 DIAGNOSIS — Z01812 Encounter for preprocedural laboratory examination: Secondary | ICD-10-CM | POA: Diagnosis not present

## 2016-04-16 LAB — CBC
HCT: 41.2 % (ref 35.0–45.0)
Hemoglobin: 13.3 g/dL (ref 11.7–15.5)
MCH: 31.4 pg (ref 27.0–33.0)
MCHC: 32.3 g/dL (ref 32.0–36.0)
MCV: 97.4 fL (ref 80.0–100.0)
MPV: 10.4 fL (ref 7.5–12.5)
Platelets: 294 10*3/uL (ref 140–400)
RBC: 4.23 MIL/uL (ref 3.80–5.10)
RDW: 13.6 % (ref 11.0–15.0)
WBC: 7.4 10*3/uL (ref 3.8–10.8)

## 2016-04-16 LAB — BASIC METABOLIC PANEL
BUN: 22 mg/dL (ref 7–25)
CO2: 26 mmol/L (ref 20–31)
Calcium: 9.6 mg/dL (ref 8.6–10.4)
Chloride: 103 mmol/L (ref 98–110)
Creat: 1.33 mg/dL — ABNORMAL HIGH (ref 0.60–0.93)
Glucose, Bld: 92 mg/dL (ref 65–99)
Potassium: 4.5 mmol/L (ref 3.5–5.3)
Sodium: 139 mmol/L (ref 135–146)

## 2016-04-16 MED ORDER — ATORVASTATIN CALCIUM 40 MG PO TABS: 40.0000 mg | ORAL_TABLET | Freq: Every day | ORAL | 11 refills | Status: DC

## 2016-04-16 NOTE — Assessment & Plan Note (Signed)
Pt has bilateral foot deformity and pain

## 2016-04-16 NOTE — Progress Notes (Signed)
04/16/2016 [name]   [birth year]  [id number]  Primary Physician Gilles Chiquito, MD Primary Cardiologist: Dr Gwenlyn Found  HPI:  75 year old legally blind AA female with a past medical history of HTN, ongoing tobacco abuse, HLD, COPD, CKD stage III who was referred to Dr Gwenlyn Found 02/20/16 by Dr Milinda Pointer at [facility] for LE arterial evaluation. He needs to perform surgery on her feet and noted decreased circulation on exam. LEA dopplers confirmed severe LE arterial disease and she was admitted 11/5/17for hydration prior PV Angiogram. PV angiogram done on 03/17/16 revealed 99% proximal Lt SFA and 90% mid Lt SFA with one vessel run off. The Rt SFA was occluded at mid vessel with one vessel run off. Intervention was not done at that time secondary to CRI (SCr 1.6). She was hydrated overnight and discharged. She was re admitted for Lt SFA PTA 04/07/16. This was successful. LEA Dopplers today show an ABI of 0.84 on Lt. She will be admitted for elective staged Rt SFA PTA.     Current Outpatient Prescriptions  Medication Sig Dispense Refill  . acetaminophen (TYLENOL) 500 MG tablet Take 500-1,000 mg by mouth every 6 (six) hours as needed (for pain/foot pain.).     Marland Kitchen albuterol (PROVENTIL HFA;VENTOLIN HFA) 108 (90 Base) MCG/ACT inhaler Inhale 2 puffs into the lungs every 6 (six) hours as needed for wheezing or shortness of breath. 1 Inhaler 6  . aspirin EC 81 MG EC tablet Take 1 tablet (81 mg total) by mouth daily.    Marland Kitchen atorvastatin (LIPITOR) 40 MG tablet Take 1 tablet (40 mg total) by mouth daily. 30 tablet 11  . clopidogrel (PLAVIX) 75 MG tablet Take 1 tablet (75 mg total) by mouth daily with breakfast. 30 tablet 12  . hydrochlorothiazide (MICROZIDE) 12.5 MG capsule Take 1 capsule (12.5 mg total) by mouth daily. 30 capsule 6  . prednisoLONE acetate (PRED FORTE) 1 % ophthalmic suspension Place 1 drop into both eyes daily.    . Vitamin D, Cholecalciferol, 1000 units CAPS Take 1 capsule by mouth  daily. 90 capsule 3   No current facility-administered medications for this visit.     Allergies  Allergen Reactions  . Codeine Other (See Comments)    REACTION: Pt c/o "feeling high"  . Morphine Other (See Comments)    REACTION: feels funny and "high"    Social History   Social History  . Marital status: Divorced    Spouse name: N/A  . Number of children: N/A  . Years of education: N/A   Occupational History  . Not on file.   Social History Main Topics  . Smoking status: Current Every Day Smoker    Packs/day: 0.50    Years: 35.00    Types: Cigarettes  . Smokeless tobacco: Never Used     Comment: .  Marland Kitchen Alcohol use No  . Drug use: No  . Sexual activity: No   Other Topics Concern  . Not on file   Social History Narrative   Patient currently lives at home alone, but does have a home worker who visits her throughout the week to help with things around the house.  Patient states that she still tries to remain active and walks daily.  She is has been divorced for sometime and has one son.  She currently lives off social security.  Patient reports using approximately half a pack of cigarettes daily for 20 years.  She denies any alcohol or illicit drug use.  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       Review of Systems: General: negative for chills, fever, night sweats or weight changes.  Cardiovascular: negative for chest pain, dyspnea on exertion, edema, orthopnea, palpitations, paroxysmal nocturnal dyspnea or shortness of breath Dermatological: negative for rash Respiratory: negative for cough or wheezing Urologic: negative for hematuria Abdominal: negative for nausea, vomiting, diarrhea, bright red blood per rectum, melena, or hematemesis Neurologic: negative for visual changes, syncope, or dizziness All other systems reviewed and are otherwise negative except as noted above.    Blood pressure 130/80,  pulse 60, height '5\' 5"'$  (1.651 m), weight 158 lb 12.8 oz (72 kg).  General appearance: alert, cooperative, no distress, mildly obese and blind Neck: no carotid bruit and no JVD Lungs: decreased BS c/w COPD Heart: regular rate and rhythm Extremities: LLE warmer than RLE, decreased pulses bilaterally. Bilateral foot deformity Pulses: diminnished Skin: Skin color, texture, turgor normal. No rashes or lesions Neurologic: Grossly normal   ASSESSMENT AND PLAN:   PVD (peripheral vascular disease) (HCC) Bilateral SFA disease. S/P Lt SFA PTA 04/07/16- to have staged Rt SFA PTA in preparation for bilateral foot surgery.  COPD with acute lower respiratory infection Still smoking though she has cut back to < 1/2 pk a day  Essential hypertension Controlled  CKD (chronic kidney disease) stage 3, GFR 30-59 ml/min SCr 1.29 after recent PTA  BLINDNESS Pt is legally blind  Bilateral foot pain Pt has bilateral foot deformity and pain   PLAN  Pt seen by Dr Gwenlyn Found and myself. She will be set up for staged Lt SFA PTA. Hold HCTZ the day before her PTA.  Refer back to Dr Milinda Pointer after this to schedule her foot surgery. I added Lipitor 40 mg to her medications.   Kerin Ransom PA-C 04/16/2016 10:52 AM

## 2016-04-16 NOTE — Assessment & Plan Note (Signed)
Controlled.  

## 2016-04-16 NOTE — Assessment & Plan Note (Signed)
SCr 1.29 after recent PTA

## 2016-04-16 NOTE — Assessment & Plan Note (Signed)
Bilateral SFA disease. S/P Lt SFA PTA 04/07/16- to have staged Rt SFA PTA in preparation for bilateral foot surgery.

## 2016-04-16 NOTE — Assessment & Plan Note (Signed)
Still smoking though she has cut back to < 1/2 pk a day

## 2016-04-16 NOTE — Assessment & Plan Note (Signed)
Pt is legally blind. 

## 2016-04-16 NOTE — Patient Instructions (Addendum)
Kerin Ransom, PA-C has recommended making the following medication changes: 1. START Atorvastatin 40 mg - take 1 tablet by mouth once daily  Your physician has ordered a peripheral angiogram first available (before Christmas) with Dr Gwenlyn Found to be done at [facility].  This procedure is going to look at the bloodflow in your lower extremities.  If [name] is able to open up the arteries, you will have to spend one night in the hospital.  If he is not able to open the arteries, you will be able to go home that same day.    After the procedure, you will not be allowed to drive for 3 days or push, pull, or lift anything greater than 10 lbs for one week.    You will be required to have the following tests prior to the procedure:  1. Blood work-the blood work can be done no more than 7 days prior to the procedure.  It can be done at any [facility] lab.  There is one downstairs on the first floor of this building and one in the [facility] building [id number] [address])  2. Chest Xray-the chest xray order has already been placed at the [place].     *REPS - SCOTT  Puncture site - Left

## 2016-04-17 ENCOUNTER — Other Ambulatory Visit: Payer: Self-pay | Admitting: Cardiovascular Disease

## 2016-04-17 DIAGNOSIS — I739 Peripheral vascular disease, unspecified: Secondary | ICD-10-CM

## 2016-04-17 LAB — PROTIME-INR
INR: 1
Prothrombin Time: 10.5 s (ref 9.0–11.5)

## 2016-04-17 LAB — APTT: aPTT: 38 s — ABNORMAL HIGH (ref 22–34)

## 2016-04-18 ENCOUNTER — Telehealth: Payer: Self-pay | Admitting: Cardiovascular Disease

## 2016-04-18 NOTE — Telephone Encounter (Signed)
New message   CNA calling for rn to call back about if she needs a refill for her blood thinner

## 2016-04-18 NOTE — Telephone Encounter (Signed)
Spoke with [name] pt's caregiver she is asking if pt needs to stop her Plavix before her angiogram on 04-24-16. Please advise

## 2016-04-18 NOTE — Telephone Encounter (Signed)
Spoke with Lurena Joiner he states that she should not stop Plavix  Caregiver notified

## 2016-04-21 ENCOUNTER — Other Ambulatory Visit: Payer: Self-pay | Admitting: Internal Medicine

## 2016-04-24 ENCOUNTER — Encounter (HOSPITAL_COMMUNITY)
Admission: EM | Disposition: A | Discharge status: Home / Self Care | Payer: Self-pay | Source: Home / Self Care | Attending: Emergency Medicine

## 2016-04-24 ENCOUNTER — Encounter (HOSPITAL_COMMUNITY)
Admission: RE | Disposition: A | Discharge status: Home / Self Care | Payer: Self-pay | Source: Ambulatory Visit | Attending: Cardiovascular Disease

## 2016-04-24 ENCOUNTER — Encounter (HOSPITAL_COMMUNITY): Payer: Self-pay | Admitting: General Practice

## 2016-04-24 ENCOUNTER — Ambulatory Visit (HOSPITAL_COMMUNITY)
Admission: RE | Admit: 2016-04-24 | Discharge: 2016-04-25 | Disposition: A | Discharge status: Home / Self Care | Payer: Medicare Other | Source: Ambulatory Visit | Attending: Cardiovascular Disease | Admitting: Cardiovascular Disease

## 2016-04-24 ENCOUNTER — Emergency Department (HOSPITAL_COMMUNITY)
Admission: EM | Admit: 2016-04-24 | Discharge: 2016-04-24 | Disposition: A | Discharge status: Home / Self Care | Payer: Medicare Other | Source: Home / Self Care | Attending: Emergency Medicine | Admitting: Emergency Medicine

## 2016-04-24 ENCOUNTER — Ambulatory Visit (HOSPITAL_COMMUNITY): Admission: RE | Admit: 2016-04-24 | Payer: Medicare Other | Source: Ambulatory Visit | Admitting: Cardiovascular Disease

## 2016-04-24 ENCOUNTER — Encounter (HOSPITAL_COMMUNITY): Payer: Self-pay

## 2016-04-24 DIAGNOSIS — Z7982 Long term (current) use of aspirin: Secondary | ICD-10-CM

## 2016-04-24 DIAGNOSIS — N183 Chronic kidney disease, stage 3 unspecified: Secondary | ICD-10-CM | POA: Diagnosis present

## 2016-04-24 DIAGNOSIS — H548 Legal blindness, as defined in USA: Secondary | ICD-10-CM | POA: Insufficient documentation

## 2016-04-24 DIAGNOSIS — I129 Hypertensive chronic kidney disease with stage 1 through stage 4 chronic kidney disease, or unspecified chronic kidney disease: Secondary | ICD-10-CM | POA: Insufficient documentation

## 2016-04-24 DIAGNOSIS — F17210 Nicotine dependence, cigarettes, uncomplicated: Secondary | ICD-10-CM

## 2016-04-24 DIAGNOSIS — M79672 Pain in left foot: Secondary | ICD-10-CM | POA: Diagnosis not present

## 2016-04-24 DIAGNOSIS — M79661 Pain in right lower leg: Secondary | ICD-10-CM | POA: Insufficient documentation

## 2016-04-24 DIAGNOSIS — I739 Peripheral vascular disease, unspecified: Secondary | ICD-10-CM | POA: Diagnosis present

## 2016-04-24 DIAGNOSIS — J440 Chronic obstructive pulmonary disease with acute lower respiratory infection: Secondary | ICD-10-CM | POA: Insufficient documentation

## 2016-04-24 DIAGNOSIS — E785 Hyperlipidemia, unspecified: Secondary | ICD-10-CM | POA: Diagnosis not present

## 2016-04-24 DIAGNOSIS — R531 Weakness: Secondary | ICD-10-CM | POA: Diagnosis not present

## 2016-04-24 DIAGNOSIS — M79671 Pain in right foot: Secondary | ICD-10-CM | POA: Insufficient documentation

## 2016-04-24 DIAGNOSIS — D631 Anemia in chronic kidney disease: Secondary | ICD-10-CM | POA: Diagnosis not present

## 2016-04-24 DIAGNOSIS — I998 Other disorder of circulatory system: Secondary | ICD-10-CM | POA: Diagnosis present

## 2016-04-24 DIAGNOSIS — F17200 Nicotine dependence, unspecified, uncomplicated: Secondary | ICD-10-CM | POA: Diagnosis present

## 2016-04-24 DIAGNOSIS — I70221 Atherosclerosis of native arteries of extremities with rest pain, right leg: Secondary | ICD-10-CM | POA: Diagnosis not present

## 2016-04-24 DIAGNOSIS — Z885 Allergy status to narcotic agent status: Secondary | ICD-10-CM | POA: Insufficient documentation

## 2016-04-24 DIAGNOSIS — J449 Chronic obstructive pulmonary disease, unspecified: Secondary | ICD-10-CM

## 2016-04-24 DIAGNOSIS — I10 Essential (primary) hypertension: Secondary | ICD-10-CM | POA: Diagnosis present

## 2016-04-24 DIAGNOSIS — Z7902 Long term (current) use of antithrombotics/antiplatelets: Secondary | ICD-10-CM | POA: Diagnosis not present

## 2016-04-24 DIAGNOSIS — M21962 Unspecified acquired deformity of left lower leg: Secondary | ICD-10-CM | POA: Diagnosis not present

## 2016-04-24 DIAGNOSIS — M79609 Pain in unspecified limb: Secondary | ICD-10-CM | POA: Diagnosis not present

## 2016-04-24 DIAGNOSIS — M21961 Unspecified acquired deformity of right lower leg: Secondary | ICD-10-CM | POA: Insufficient documentation

## 2016-04-24 DIAGNOSIS — H9190 Unspecified hearing loss, unspecified ear: Secondary | ICD-10-CM | POA: Insufficient documentation

## 2016-04-24 DIAGNOSIS — I70229 Atherosclerosis of native arteries of extremities with rest pain, unspecified extremity: Secondary | ICD-10-CM | POA: Diagnosis present

## 2016-04-24 HISTORY — DX: Chronic kidney disease, stage 3 unspecified: N18.30

## 2016-04-24 HISTORY — DX: Tobacco use: Z72.0

## 2016-04-24 HISTORY — PX: PERIPHERAL VASCULAR CATHETERIZATION: SHX172C

## 2016-04-24 HISTORY — DX: Chronic kidney disease, stage 3 (moderate): N18.3

## 2016-04-24 HISTORY — DX: Hyperlipidemia, unspecified: E78.5

## 2016-04-24 HISTORY — DX: Chronic obstructive pulmonary disease, unspecified: J44.9

## 2016-04-24 LAB — POCT ACTIVATED CLOTTING TIME
ACTIVATED CLOTTING TIME: 290 s
ACTIVATED CLOTTING TIME: 252 s
Activated Clotting Time: 219 seconds
Activated Clotting Time: 175 seconds

## 2016-04-24 SURGERY — LOWER EXTREMITY ANGIOGRAPHY
Anesthesia: LOCAL | Laterality: Left

## 2016-04-24 SURGERY — PERIPHERAL VASCULAR ATHERECTOMY
Laterality: Right

## 2016-04-24 MED ORDER — SODIUM CHLORIDE 0.9 % WEIGHT BASED INFUSION: 1.0000 mL/kg/h | INTRAVENOUS | Status: DC

## 2016-04-24 MED ORDER — ACETAMINOPHEN 500 MG PO TABS: 500.0000 mg | ORAL_TABLET | Freq: Four times a day (QID) | ORAL | Status: DC | PRN

## 2016-04-24 MED ORDER — ASPIRIN EC 81 MG PO TBEC: 81.0000 mg | DELAYED_RELEASE_TABLET | Freq: Every day | ORAL | Status: DC

## 2016-04-24 MED ORDER — SODIUM CHLORIDE 0.9 % IV SOLN: INTRAVENOUS | Status: AC

## 2016-04-24 MED ORDER — SODIUM CHLORIDE 0.9% FLUSH: 3.0000 mL | INTRAVENOUS | Status: DC | PRN

## 2016-04-24 MED ORDER — HEPARIN SODIUM (PORCINE) 1000 UNIT/ML IJ SOLN
INTRAMUSCULAR | Status: DC | PRN
  Administered 2016-04-24: 2500 [IU] via INTRAVENOUS
  Administered 2016-04-24: 6000 [IU] via INTRAVENOUS

## 2016-04-24 MED ORDER — ALBUTEROL SULFATE (2.5 MG/3ML) 0.083% IN NEBU: 3.0000 mL | INHALATION_SOLUTION | Freq: Four times a day (QID) | RESPIRATORY_TRACT | Status: DC | PRN

## 2016-04-24 MED ORDER — ONDANSETRON HCL 4 MG/2ML IJ SOLN: 4.0000 mg | Freq: Four times a day (QID) | INTRAMUSCULAR | Status: DC | PRN

## 2016-04-24 MED ORDER — SODIUM CHLORIDE 0.9% FLUSH: 3.0000 mL | Freq: Two times a day (BID) | INTRAVENOUS | Status: DC

## 2016-04-24 MED ORDER — CLOPIDOGREL BISULFATE 75 MG PO TABS
75.0000 mg | ORAL_TABLET | Freq: Every day | ORAL | Status: DC
  Administered 2016-04-25: 75 mg via ORAL
  Filled 2016-04-24: qty 1

## 2016-04-24 MED ORDER — HEPARIN (PORCINE) IN NACL 2-0.9 UNIT/ML-% IJ SOLN
INTRAMUSCULAR | Status: DC | PRN
Start: 2016-04-24 — End: 2016-04-24
  Administered 2016-04-24: 1000 mL via INTRA_ARTERIAL

## 2016-04-24 MED ORDER — SODIUM CHLORIDE 0.9 % IV SOLN
INTRAVENOUS | Status: DC
  Administered 2016-04-24: 03:00:00 via INTRAVENOUS

## 2016-04-24 MED ORDER — FENTANYL CITRATE (PF) 100 MCG/2ML IJ SOLN
INTRAMUSCULAR | Status: DC | PRN
  Administered 2016-04-24 (×2): 25 ug via INTRAVENOUS

## 2016-04-24 MED ORDER — NICOTINE 14 MG/24HR TD PT24
14.0000 mg | MEDICATED_PATCH | Freq: Every day | TRANSDERMAL | Status: DC
  Administered 2016-04-24 – 2016-04-25 (×2): 14 mg via TRANSDERMAL
  Filled 2016-04-24 (×2): qty 1

## 2016-04-24 MED ORDER — ACETAMINOPHEN 325 MG PO TABS
650.0000 mg | ORAL_TABLET | ORAL | Status: DC | PRN
  Administered 2016-04-24 – 2016-04-25 (×2): 650 mg via ORAL
  Filled 2016-04-24 (×2): qty 2

## 2016-04-24 MED ORDER — PREDNISOLONE ACETATE 1 % OP SUSP
1.0000 [drp] | Freq: Every day | OPHTHALMIC | Status: DC
  Administered 2016-04-24 – 2016-04-25 (×2): 1 [drp] via OPHTHALMIC
  Filled 2016-04-24: qty 1

## 2016-04-24 MED ORDER — HEPARIN SODIUM (PORCINE) 1000 UNIT/ML IJ SOLN
INTRAMUSCULAR | Status: AC
  Filled 2016-04-24: qty 1

## 2016-04-24 MED ORDER — IODIXANOL 320 MG/ML IV SOLN
INTRAVENOUS | Status: DC | PRN
  Administered 2016-04-24: 130 mL via INTRA_ARTERIAL

## 2016-04-24 MED ORDER — SODIUM CHLORIDE 0.9 % IV SOLN: 1.0000 mL/kg/h | INTRAVENOUS | Status: DC

## 2016-04-24 MED ORDER — HYDRALAZINE HCL 20 MG/ML IJ SOLN: 10.0000 mg | INTRAMUSCULAR | Status: DC | PRN

## 2016-04-24 MED ORDER — MIDAZOLAM HCL 2 MG/2ML IJ SOLN
INTRAMUSCULAR | Status: AC
  Filled 2016-04-24: qty 2

## 2016-04-24 MED ORDER — ATORVASTATIN CALCIUM 40 MG PO TABS
40.0000 mg | ORAL_TABLET | Freq: Every day | ORAL | Status: DC
  Administered 2016-04-24 – 2016-04-25 (×2): 40 mg via ORAL
  Filled 2016-04-24 (×2): qty 1

## 2016-04-24 MED ORDER — SODIUM CHLORIDE 0.9 % WEIGHT BASED INFUSION: 3.0000 mL/kg/h | INTRAVENOUS | Status: DC

## 2016-04-24 MED ORDER — LIDOCAINE HCL (PF) 1 % IJ SOLN
INTRAMUSCULAR | Status: AC
  Filled 2016-04-24: qty 30

## 2016-04-24 MED ORDER — FENTANYL CITRATE (PF) 100 MCG/2ML IJ SOLN
50.0000 ug | Freq: Once | INTRAMUSCULAR | Status: AC
  Administered 2016-04-24: 50 ug via INTRAVENOUS
  Filled 2016-04-24: qty 2

## 2016-04-24 MED ORDER — ASPIRIN EC 81 MG PO TBEC
81.0000 mg | DELAYED_RELEASE_TABLET | Freq: Every day | ORAL | Status: DC
  Administered 2016-04-25: 81 mg via ORAL
  Filled 2016-04-24: qty 1

## 2016-04-24 MED ORDER — LIDOCAINE HCL (PF) 1 % IJ SOLN
INTRAMUSCULAR | Status: DC | PRN
  Administered 2016-04-24: 25 mL via SUBCUTANEOUS

## 2016-04-24 MED ORDER — CLOPIDOGREL BISULFATE 75 MG PO TABS: 75.0000 mg | ORAL_TABLET | Freq: Every day | ORAL | Status: DC

## 2016-04-24 MED ORDER — ONDANSETRON HCL 4 MG/2ML IJ SOLN
4.0000 mg | Freq: Once | INTRAMUSCULAR | Status: AC
  Administered 2016-04-24: 4 mg via INTRAVENOUS
  Filled 2016-04-24: qty 2

## 2016-04-24 MED ORDER — ASPIRIN 81 MG PO CHEW
81.0000 mg | CHEWABLE_TABLET | ORAL | Status: AC
  Administered 2016-04-24: 81 mg via ORAL

## 2016-04-24 MED ORDER — HYDROCHLOROTHIAZIDE 12.5 MG PO CAPS
12.5000 mg | ORAL_CAPSULE | Freq: Every day | ORAL | Status: DC
  Administered 2016-04-24 – 2016-04-25 (×2): 12.5 mg via ORAL
  Filled 2016-04-24 (×2): qty 1

## 2016-04-24 MED ORDER — ASPIRIN 81 MG PO CHEW: 81.0000 mg | CHEWABLE_TABLET | ORAL | Status: DC

## 2016-04-24 MED ORDER — HEPARIN (PORCINE) IN NACL 2-0.9 UNIT/ML-% IJ SOLN
INTRAMUSCULAR | Status: AC
  Filled 2016-04-24: qty 1000

## 2016-04-24 MED ORDER — ANGIOPLASTY BOOK
Freq: Once | Status: AC
Start: 2016-04-24 — End: 2016-04-24
  Administered 2016-04-24: 20:00:00
  Filled 2016-04-24: qty 1

## 2016-04-24 MED ORDER — SODIUM CHLORIDE 0.9 % IV SOLN: 250.0000 mL | INTRAVENOUS | Status: DC | PRN

## 2016-04-24 MED ORDER — FENTANYL CITRATE (PF) 100 MCG/2ML IJ SOLN
INTRAMUSCULAR | Status: AC
  Filled 2016-04-24: qty 2

## 2016-04-24 MED ORDER — MIDAZOLAM HCL 2 MG/2ML IJ SOLN
INTRAMUSCULAR | Status: DC | PRN
  Administered 2016-04-24: 1 mg via INTRAVENOUS

## 2016-04-24 MED ORDER — SODIUM CHLORIDE 0.9 % WEIGHT BASED INFUSION
3.0000 mL/kg/h | INTRAVENOUS | Status: DC
  Administered 2016-04-24: 3 mL/kg/h via INTRAVENOUS

## 2016-04-24 SURGICAL SUPPLY — 29 items
BALLN ARMADA 2.0X200X150 (BALLOONS) ×4
BALLN IN.PACT DCB 5X80 (BALLOONS) ×4
BALLOON ARMADA 2.0X200X150 (BALLOONS) ×1 IMPLANT
CATH HAWKONE LS STANDARD TIP (CATHETERS) ×4
CATH HAWKONE LS STD TIP (CATHETERS) ×1 IMPLANT
CATH QUICKCROSS .018X135CM (MICROCATHETER) ×3 IMPLANT
CATH QUICKCROSS .035X135CM (MICROCATHETER) ×3 IMPLANT
CATH TEMPO 5F RIM 65CM (CATHETERS) ×3 IMPLANT
CATH VIANCE CROSS STAND 150CM (MICROCATHETER) ×4
CATH VIANCE CROSS STD 150CM (MICROCATHETER) ×1 IMPLANT
DCB IN.PACT 5X80 (BALLOONS) ×1 IMPLANT
DEVICE CONTINUOUS FLUSH (MISCELLANEOUS) ×3 IMPLANT
DEVICE SPIDERFX EMB PROT 5MM (WIRE) ×3 IMPLANT
GUIDEWIRE ASTATO XS 20G 300CM (WIRE) ×3 IMPLANT
KIT ENCORE 26 ADVANTAGE (KITS) ×3 IMPLANT
KIT PV (KITS) ×4 IMPLANT
KIT SINGLE-Y CONNECTOR (CONNECTOR) ×3 IMPLANT
SHEATH HIGHFLEX ANSEL 7FR 55CM (SHEATH) ×3 IMPLANT
SHEATH PINNACLE 5F 10CM (SHEATH) ×3 IMPLANT
SHEATH PINNACLE 7F 10CM (SHEATH) ×3 IMPLANT
STOPCOCK MORSE 400PSI 3WAY (MISCELLANEOUS) ×3 IMPLANT
SYRINGE MEDRAD AVANTA MACH 7 (SYRINGE) ×3 IMPLANT
TAPE RADIOPAQUE TURBO (MISCELLANEOUS) ×6 IMPLANT
TRANSDUCER W/STOPCOCK (MISCELLANEOUS) ×4 IMPLANT
TRAY PV CATH (CUSTOM PROCEDURE TRAY) ×4 IMPLANT
TUBING CIL FLEX 10 FLL-RA (TUBING) ×3 IMPLANT
WIRE HI TORQ COMMND ES.014X300 (WIRE) ×3 IMPLANT
WIRE HITORQ VERSACORE ST 145CM (WIRE) ×3 IMPLANT
WIRE SPARTACORE .014X300CM (WIRE) ×3 IMPLANT

## 2016-04-24 NOTE — Progress Notes (Signed)
Site area: left groin  Site Prior to Removal:  Level 0  Pressure Applied For 25 MINUTES    Minutes Beginning at 1820  Manual:   Yes.    Patient Status During Pull:  stable  Post Pull Groin Site:  Level 0  Post Pull Instructions Given:  Yes.    Post Pull Pulses Present:  Yes.    Dressing Applied:  Yes.    Comments:  Checked at 9 with no change in assessment

## 2016-04-24 NOTE — Discharge Instructions (Signed)
Go to the catheter lab now to see [name] for the procedure to improve the blood flow in your right leg.

## 2016-04-24 NOTE — Discharge Planning (Signed)
Pt up for discharge. [facility] reviewed chart for possible CM needs.  No needs identified or communicated. Per EDP: Plan: Home Medications- continue; Home Treatments- rest; return here if the recommended treatment, does not improve the symptoms; Recommended follow up- PCP prn.

## 2016-04-24 NOTE — Interval H&P Note (Signed)
History and Physical Interval Note:  04/24/2016 1:42 PM  [name]  has presented today for surgery, with the diagnosis of pad  The various methods of treatment have been discussed with the patient and family. After consideration of risks, benefits and other options for treatment, the patient has consented to  Procedure(s): Lower Extremity Angiography (Left) as a surgical intervention .  [name] history has been reviewed, patient examined, no change in status, stable for surgery.  I have reviewed the patient's chart and labs.  Questions were answered to the patient's satisfaction.     [name]

## 2016-04-24 NOTE — ED Triage Notes (Signed)
Pt is here with complaints of right foot pain. Pt is scheduled to have plaque removed from right left today here at [facility]. Pt reports she should couldn't bear the pain any longer and couldn't take any tylenol for pain after midnight bc of sx. Pt reports she can not walk because of the pain.

## 2016-04-24 NOTE — H&P (View-Only) (Signed)
04/16/2016 [name]   1941/04/19  [id number]  Primary Physician Gilles Chiquito, MD Primary Cardiologist: Dr Gwenlyn Found  HPI:  75 year old legally blind AA female with a past medical history of HTN, ongoing tobacco abuse, HLD, COPD, CKD stage III who was referred to Dr Gwenlyn Found 02/20/16 by Dr Milinda Pointer at [facility] for LE arterial evaluation. He needs to perform surgery on her feet and noted decreased circulation on exam. LEA dopplers confirmed severe LE arterial disease and she was admitted 11/5/17for hydration prior PV Angiogram. PV angiogram done on 03/17/16 revealed 99% proximal Lt SFA and 90% mid Lt SFA with one vessel run off. The Rt SFA was occluded at mid vessel with one vessel run off. Intervention was not done at that time secondary to CRI (SCr 1.6). She was hydrated overnight and discharged. She was re admitted for Lt SFA PTA 04/07/16. This was successful. LEA Dopplers today show an ABI of 0.84 on Lt. She will be admitted for elective staged Rt SFA PTA.     Current Outpatient Prescriptions  Medication Sig Dispense Refill  . acetaminophen (TYLENOL) 500 MG tablet Take 500-1,000 mg by mouth every 6 (six) hours as needed (for pain/foot pain.).     Marland Kitchen albuterol (PROVENTIL HFA;VENTOLIN HFA) 108 (90 Base) MCG/ACT inhaler Inhale 2 puffs into the lungs every 6 (six) hours as needed for wheezing or shortness of breath. 1 Inhaler 6  . aspirin EC 81 MG EC tablet Take 1 tablet (81 mg total) by mouth daily.    Marland Kitchen atorvastatin (LIPITOR) 40 MG tablet Take 1 tablet (40 mg total) by mouth daily. 30 tablet 11  . clopidogrel (PLAVIX) 75 MG tablet Take 1 tablet (75 mg total) by mouth daily with breakfast. 30 tablet 12  . hydrochlorothiazide (MICROZIDE) 12.5 MG capsule Take 1 capsule (12.5 mg total) by mouth daily. 30 capsule 6  . prednisoLONE acetate (PRED FORTE) 1 % ophthalmic suspension Place 1 drop into both eyes daily.    . Vitamin D, Cholecalciferol, 1000 units CAPS Take 1 capsule by mouth  daily. 90 capsule 3   No current facility-administered medications for this visit.     Allergies  Allergen Reactions  . Codeine Other (See Comments)    REACTION: Pt c/o "feeling high"  . Morphine Other (See Comments)    REACTION: feels funny and "high"    Social History   Social History  . Marital status: Divorced    Spouse name: N/A  . Number of children: N/A  . Years of education: N/A   Occupational History  . Not on file.   Social History Main Topics  . Smoking status: Current Every Day Smoker    Packs/day: 0.50    Years: 35.00    Types: Cigarettes  . Smokeless tobacco: Never Used     Comment: .  Marland Kitchen Alcohol use No  . Drug use: No  . Sexual activity: No   Other Topics Concern  . Not on file   Social History Narrative   Patient currently lives at home alone, but does have a home worker who visits her throughout the week to help with things around the house.  Patient states that she still tries to remain active and walks daily.  She is has been divorced for sometime and has one son.  She currently lives off social security.  Patient reports using approximately half a pack of cigarettes daily for 20 years.  She denies any alcohol or illicit drug use.  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       Review of Systems: General: negative for chills, fever, night sweats or weight changes.  Cardiovascular: negative for chest pain, dyspnea on exertion, edema, orthopnea, palpitations, paroxysmal nocturnal dyspnea or shortness of breath Dermatological: negative for rash Respiratory: negative for cough or wheezing Urologic: negative for hematuria Abdominal: negative for nausea, vomiting, diarrhea, bright red blood per rectum, melena, or hematemesis Neurologic: negative for visual changes, syncope, or dizziness All other systems reviewed and are otherwise negative except as noted above.    Blood pressure 130/80,  pulse 60, height '5\' 5"'$  (1.651 m), weight 158 lb 12.8 oz (72 kg).  General appearance: alert, cooperative, no distress, mildly obese and blind Neck: no carotid bruit and no JVD Lungs: decreased BS c/w COPD Heart: regular rate and rhythm Extremities: LLE warmer than RLE, decreased pulses bilaterally. Bilateral foot deformity Pulses: diminnished Skin: Skin color, texture, turgor normal. No rashes or lesions Neurologic: Grossly normal   ASSESSMENT AND PLAN:   PVD (peripheral vascular disease) (HCC) Bilateral SFA disease. S/P Lt SFA PTA 04/07/16- to have staged Rt SFA PTA in preparation for bilateral foot surgery.  COPD with acute lower respiratory infection Still smoking though she has cut back to < 1/2 pk a day  Essential hypertension Controlled  CKD (chronic kidney disease) stage 3, GFR 30-59 ml/min SCr 1.29 after recent PTA  BLINDNESS Pt is legally blind  Bilateral foot pain Pt has bilateral foot deformity and pain   PLAN  Pt seen by Dr Gwenlyn Found and myself. She will be set up for staged Lt SFA PTA. Hold HCTZ the day before her PTA.  Refer back to Dr Milinda Pointer after this to schedule her foot surgery. I added Lipitor 40 mg to her medications.   Kerin Ransom PA-C 04/16/2016 10:52 AM

## 2016-04-24 NOTE — ED Provider Notes (Signed)
[place] DEPT Provider Note   CSN: [id number] Arrival date & time: 04/24/16  5681  By signing my name below, I, Higinio Plan, attest that this documentation has been prepared under the direction and in the presence of Daleen Bo, MD . Electronically Signed: Higinio Plan, Scribe. 04/24/2016. 8:04 AM.  History   Chief Complaint Chief Complaint  Patient presents with  . Foot Pain   The history is provided by the patient. No language interpreter was used.   HPI Comments: [name] is a 75 y.o. female with PMHx of arthritis and HTN, brought in by EMS to the Emergency Department complaining of gradually worsening, bilateral foot pain and numbness that began a few days ago. Pt reports her pain is worse in her right foot compared to her left. She states she is scheduled for bypass surgery with [name] at 7:30 AM later this morning but decided to visit the ED tonight because she "couldn't bear the pain." She notes she has not taken any medication to relieve her pain. Pt reports she currently lives by herself.   Past Medical History:  Diagnosis Date  . Arthritis    "left shoulder" (04/07/2016)  . Blind    s/p bilateral corneal transplant; "legally blind in both eyes" (04/07/2016)  . DVT (deep venous thrombosis) ([place]) "years ago"   "right thigh" (04/07/2016)  . HEARING LOSS   . Hypertension   . PAD (peripheral artery disease) ([place])    a. 03/2016 Periph Angio: Hernandez SFA 99p, 64mw/ one vessel runoff. R SFA 1039m/ one vessel runofff;  b. 04/07/2016 PTA Hernandez SFA ([place]awk 1 LS atherecromty w/ DEBA).  . Phlebitis     Patient Active Problem List   Diagnosis Date Noted  . Claudication in peripheral vascular disease ([facility]11/14/2017  . PVD (peripheral vascular disease) ([facility]  . Bilateral foot pain 02/20/2016  . Epidermoid cyst 11/01/2015  . Left shoulder pain 01/25/2015  . Right wrist pain 11/02/2014  . Vitamin D deficiency 03/29/2014  . COPD with acute lower respiratory infection  ([facility]10/13/2015  . Postmenopausal 06/16/2013  . Preventative health care 06/16/2013  . Bilateral shoulder pain 09/01/2012  . Bilateral lower extremity edema 11/19/2011  . CKD (chronic kidney disease) stage 3, GFR 30-59 ml/min 11/28/2009  . Hyperlipidemia 07/02/2006  . DEAFNESS 05/13/2006  . TOBACCO ABUSE 03/26/2006  . BLINDNESS 03/26/2006  . Essential hypertension 03/26/2006    Past Surgical History:  Procedure Laterality Date  . CARPAL TUNNEL RELEASE Right 04/2003   /nArchie Endo/16/2012  . EYE SURGERY Bilateral    s/p bilateral corneal transplant  . FRACTURE SURGERY    . HAMMER TOE SURGERY Left 01/2010   /nArchie Endo/16/2011  . [place]  . PERIPHERAL VASCULAR CATHETERIZATION N/A 03/17/2016   Procedure: Lower Extremity Intervention;  Surgeon: JoLorretta HarpMD;  [facility];  Service: Cardiovascular;  Laterality: N/A;  . PERIPHERAL VASCULAR CATHETERIZATION Bilateral 04/07/2016   Procedure: Lower Extremity Angiography;  Surgeon: JoLorretta HarpMD;  [facility];  Service: Cardiovascular;  Laterality: Bilateral;  . PERIPHERAL VASCULAR CATHETERIZATION Left 04/07/2016   Procedure: Peripheral Vascular Atherectomy;  Surgeon: JoLorretta HarpMD;  [facility];  Service: Cardiovascular;  Laterality: Left;  SFA  . SHOULDER ARTHROSCOPY W/ ROTATOR CUFF REPAIR Right 09/2002   /nArchie Endo/16/2012  . SHOULDER OPEN ROTATOR CUFF REPAIR Right 1973   secondary numbness in R hand  .  SHOULDER OPEN ROTATOR CUFF REPAIR Left 06/2003   Archie Endo 09/25/2010  . WRIST FRACTURE SURGERY Right     OB History    No data available       Home Medications    Prior to Admission medications   Medication Sig Start Date End Date Taking? Authorizing Provider  acetaminophen (TYLENOL) 500 MG tablet Take 500-1,000 mg by mouth every 6 (six) hours as needed (for pain/foot pain.).    Yes Historical Provider, MD  albuterol  (PROVENTIL HFA;VENTOLIN HFA) 108 (90 Base) MCG/ACT inhaler Inhale 2 puffs into the lungs every 6 (six) hours as needed for wheezing or shortness of breath. 08/15/15  Yes Sid Falcon, MD  aspirin EC 81 MG EC tablet Take 1 tablet (81 mg total) by mouth daily. 03/19/16  Yes Arbutus Leas, NP  atorvastatin (LIPITOR) 40 MG tablet Take 1 tablet (40 mg total) by mouth daily. 04/16/16 07/15/16 Yes Erlene Quan, PA-C  clopidogrel (PLAVIX) 75 MG tablet Take 1 tablet (75 mg total) by mouth daily with breakfast. 03/19/16  Yes Arbutus Leas, NP  hydrochlorothiazide (MICROZIDE) 12.5 MG capsule take 1 capsule by mouth once daily 04/22/16  Yes Sid Falcon, MD  prednisoLONE acetate (PRED FORTE) 1 % ophthalmic suspension Place 1 drop into both eyes daily.   Yes Historical Provider, MD  Vitamin D, Cholecalciferol, 1000 units CAPS Take 1 capsule by mouth daily. Patient not taking: Reported on 04/24/2016 08/15/15   Sid Falcon, MD    Family History Family History  Problem Relation Age of Onset  . Cancer Maternal Grandmother     liver  . Cancer Maternal Uncle     colon  . Cancer Mother     liver  . Cancer Brother     Unknown  . Cancer Sister     Unknown    Social History Social History  Substance Use Topics  . Smoking status: Current Every Day Smoker    Packs/day: 0.50    Years: 35.00    Types: Cigarettes  . Smokeless tobacco: Never Used     Comment: .  Marland Kitchen Alcohol use No     Allergies   Codeine and Morphine   Review of Systems Review of Systems  Musculoskeletal: Positive for arthralgias.  Neurological: Positive for numbness.   Physical Exam Updated Vital Signs BP 105/57 (BP Location: Left Arm)   Pulse 77   Temp 97.5 F (36.4 C) (Oral)   Resp 18   Ht '5\' 5"'$  (1.651 m)   Wt 158 lb (71.7 kg)   SpO2 97%   BMI 26.29 kg/m   Physical Exam  Constitutional: She appears well-developed and well-nourished. No distress.  HENT:  Head: Normocephalic and atraumatic.  Neck: Neck supple.    Pulmonary/Chest: Effort normal.  Neurological: She is alert.  Skin: She is not diaphoretic.  Nursing note and vitals reviewed.  ED Treatments / Results  Labs (all labs ordered are listed, but only abnormal results are displayed) Labs Reviewed - No data to display  EKG  EKG Interpretation None       Radiology No results found.  Procedures Procedures (including critical care time)  Medications Ordered in ED Medications  0.9 %  sodium chloride infusion ( Intravenous New Bag/Given 04/24/16 0259)  0.9% sodium chloride infusion (not administered)    Followed by  0.9% sodium chloride infusion (not administered)  sodium chloride flush (NS) 0.9 % injection 3 mL (not administered)  aspirin chewable tablet 81 mg (not administered)  fentaNYL (SUBLIMAZE) injection 50 mcg (50 mcg Intravenous Given 04/24/16 0259)  ondansetron (ZOFRAN) injection 4 mg (4 mg Intravenous Given 04/24/16 0259)    DIAGNOSTIC STUDIES:  Oxygen Saturation is 97% on RA, normal by my interpretation.    COORDINATION OF CARE:  2:45 AM Discussed treatment plan with pt at bedside and pt agreed to plan.  Initial Impression / Assessment and Plan / ED Course  I have reviewed the triage vital signs and the nursing notes.  Pertinent labs & imaging results that were available during my care of the patient were reviewed by me and considered in my medical decision making (see chart for details).  Clinical Course as of Apr 25 803  Thu Apr 24, 2016  9924 She is more comfortable now able ambulate to the bathroom using her walker. Plans are to send patient to the Cath Lab for her procedure at 8:45 AM this morning  [EW]    Clinical Course User Index [EW] Daleen Bo, MD    Medications  0.9 %  sodium chloride infusion ( Intravenous New Bag/Given 04/24/16 0259)  0.9% sodium chloride infusion (not administered)    Followed by  0.9% sodium chloride infusion (not administered)  sodium chloride flush (NS) 0.9 %  injection 3 mL (not administered)  aspirin chewable tablet 81 mg (not administered)  fentaNYL (SUBLIMAZE) injection 50 mcg (50 mcg Intravenous Given 04/24/16 0259)  ondansetron (ZOFRAN) injection 4 mg (4 mg Intravenous Given 04/24/16 0259)    Patient Vitals for the past 24 hrs:  BP Temp Temp src Pulse Resp SpO2 Height Weight  04/24/16 0715 139/65 - - 63 - 96 % - -  04/24/16 0228 105/57 97.5 F (36.4 C) Oral 77 18 97 % - -  04/24/16 0221 - - - - - - '5\' 5"'$  (1.651 m) 158 lb (71.7 kg)  04/24/16 0218 - - - - - 97 % - -    8:04 AM Reevaluation with update and discussion. After initial assessment and treatment, an updated evaluation reveals She remains comfortable. She has been able to walk to the bathroom, using her walker without problems. [name]    Final Clinical Impressions(s) / ED Diagnoses   Final diagnoses:  PVD (peripheral vascular disease) ([place])  Pain of right lower leg    Peripheral vascular disease with secondary pain. She has not had acutely ischemic foot. She has been scheduled for intervention, this morning is stable for discharge to go to the procedure.  Nursing Notes Reviewed/ Care Coordinated Applicable Imaging Reviewed Interpretation of Laboratory Data incorporated into ED treatment  The patient appears reasonably screened and/or stabilized for discharge and I doubt any other medical condition or other [facility] requiring further screening, evaluation, or treatment in the ED at this time prior to discharge.  Plan: Home Medications- continue; Home Treatments- rest; return here if the recommended treatment, does not improve the symptoms; Recommended follow up- PCP prn   New Prescriptions New Prescriptions   No medications on file   I personally performed the services described in this documentation, which was scribed in my presence. The recorded information has been reviewed and is accurate.     Daleen Bo, MD 04/24/16 [phone]

## 2016-04-25 ENCOUNTER — Other Ambulatory Visit: Payer: Self-pay | Admitting: Physician Assistant

## 2016-04-25 ENCOUNTER — Encounter (HOSPITAL_COMMUNITY): Payer: Self-pay | Admitting: Cardiovascular Disease

## 2016-04-25 DIAGNOSIS — E784 Other hyperlipidemia: Secondary | ICD-10-CM | POA: Diagnosis not present

## 2016-04-25 DIAGNOSIS — I10 Essential (primary) hypertension: Secondary | ICD-10-CM

## 2016-04-25 DIAGNOSIS — I739 Peripheral vascular disease, unspecified: Secondary | ICD-10-CM

## 2016-04-25 DIAGNOSIS — I998 Other disorder of circulatory system: Secondary | ICD-10-CM | POA: Diagnosis not present

## 2016-04-25 LAB — CBC
HCT: 32.6 % — ABNORMAL LOW (ref 36.0–46.0)
Hemoglobin: 10.4 g/dL — ABNORMAL LOW (ref 12.0–15.0)
MCH: 31 pg (ref 26.0–34.0)
MCHC: 31.9 g/dL (ref 30.0–36.0)
MCV: 97 fL (ref 78.0–100.0)
PLATELETS: 210 10*3/uL (ref 150–400)
RBC: 3.36 MIL/uL — ABNORMAL LOW (ref 3.87–5.11)
RDW: 14.2 % (ref 11.5–15.5)
WBC: 6.3 10*3/uL (ref 4.0–10.5)

## 2016-04-25 LAB — BASIC METABOLIC PANEL
Anion gap: 5 (ref 5–15)
BUN: 13 mg/dL (ref 6–20)
CALCIUM: 8.5 mg/dL — AB (ref 8.9–10.3)
CO2: 25 mmol/L (ref 22–32)
CREATININE: 1.23 mg/dL — AB (ref 0.44–1.00)
Chloride: 108 mmol/L (ref 101–111)
GFR calc Af Amer: 48 mL/min — ABNORMAL LOW (ref >60)
GFR, EST NON AFRICAN AMERICAN: 42 mL/min — AB (ref >60)
GLUCOSE: 114 mg/dL — AB (ref 65–99)
Potassium: 4.4 mmol/L (ref 3.5–5.1)
SODIUM: 138 mmol/L (ref 135–145)

## 2016-04-25 NOTE — Care Management Note (Signed)
Case Management Note  Patient Details  Name: [name] MRN: [id number] Date of Birth: 20-Mar-1941  Subjective/Objective:  S/p pv intervention, will be on plavix, NCM will follow for dc needs.                   Action/Plan:   Expected Discharge Date:                  Expected Discharge Plan:  Home/Self Care  In-House Referral:     Discharge planning Services     Post Acute Care Choice:    Choice offered to:     DME Arranged:    DME Agency:     HH Arranged:    HH Agency:     Status of Service:  Completed, signed off  If discussed at [employer] of Stay Meetings, dates discussed:    Additional Comments:  Zenon Mayo, RN 04/25/2016, 10:12 AM

## 2016-04-25 NOTE — Discharge Summary (Signed)
Discharge Summary    Patient ID: SHAWNYA MAYOR,  MRN: [id number], DOB/AGE: June 08, 1940 75 y.o.  Admit date: 04/24/2016 Discharge date: 04/25/2016  Primary Care Provider: Gilles Chiquito Primary Cardiologist: [name]  Discharge Diagnoses    Principal Problem:   Critical lower limb ischemia Active Problems:   PVD (peripheral vascular disease) s/p LLE PTA 03/2016, RLE PTA 04/2016   Hyperlipidemia   TOBACCO ABUSE   Essential hypertension   CKD (chronic kidney disease) stage 3, GFR 30-59 ml/min   COPD (chronic obstructive pulmonary disease) (HCC)    Diagnostic Studies/Procedures    1. PV Angio 04/24/16 (see full report for further details) Procedures Performed:            1. Contralateral access (second order catheter placement)            2. Placement of spider distal protection device in right popliteal artery across chronic total occlusion            3. Hawk 1 directional atherectomy mid right SFA            4. Drug-eluting balloon angioplasty right SFA            5. PTA right tibioperoneal trunk and posterior tibial artery _____________     History of Present Illness     Dellar Traber Hannig is a 75 y.o. legally blind AA female with a past medical history of HTN, ongoing tobacco abuse, HLD, COPD, CKD stage III, and recently diagnosed PAD (s/p L SFA PTA 04/07/16) who presented for elective staged R SFA PTA.    Hospital Course    On 04/24/16 she underwent PTA of the R LE with [facility] 1 directional atherectomy mid right SFA, Drug-eluting balloon angioplasty right SFA, PTA right tibioperoneal trunk and posterior tibial artery. Post-op course was fairly uncomplicated. She did have some bleeding of the left groin site but this stopped quickly and she did not have any hematoma or bruit. She was hydrated and post-procedure Cr remained stable. Her post-procedure hemoglobin was 10.4. Pre-procedurally this was 12-13. The drop was felt 2/2 hydration and mild bleeding. (Her  post-procedure Hgb last admission was 11.7). She had no other evidence of bleeding. She feels well today. Tobacco cessation advised. Dr. Ellyn Hack has seen and examined the patient today and feels she is stable for discharge. She will have LEA duplex next week and then f/u in office in 2-3 weeks. The PV scheduler was busy at time of discharge, thus I sent a staff message to help arrange the duplex. Her f/u appt is already scheduled. Would consider repeat CBC at f/u appt to ensure stability.  _____________  Discharge Vitals Blood pressure (!) 120/50, pulse 79, temperature 97.1 F (36.2 C), temperature source Axillary, resp. rate 12, height '5\' 6"'$  (1.676 m), weight 165 lb 2 oz (74.9 kg), SpO2 96 %.  Filed Weights   04/24/16 0953 04/25/16 0119  Weight: 156 lb (70.8 kg) 165 lb 2 oz (74.9 kg)    Labs & Radiologic Studies    CBC  Recent Labs  04/25/16 0309  WBC 6.3  HGB 10.4*  HCT 32.6*  MCV 97.0  PLT 195   Basic Metabolic Panel  Recent Labs  04/25/16 0309  NA 138  K 4.4  CL 108  CO2 25  GLUCOSE 114*  BUN 13  CREATININE 1.23*  CALCIUM 8.5*  ____________  No results found. Disposition   Pt is being discharged home today in good condition.  Follow-up Plans &  Appointments    Follow-up Information    [facility] Follow up.   Specialty:  Cardiology Why:  The office will call you to schedule your leg ultrasound for 1 week. You will then follow up with Rosaria Ferries, one of Dr. Kennon Holter PAs, on 05/16/16 at 10:30am. Contact information: [address] [place] [phone]         Discharge Instructions    Diet - low sodium heart healthy    Complete by:  As directed    Increase activity slowly    Complete by:  As directed    No driving for 2 days. No lifting over 5 lbs for 1 week. No sexual activity for 1 week. Keep procedure site clean & dry. If you notice increased pain, swelling, bleeding or pus, call/return!  You may  shower, but no soaking baths/hot tubs/pools for 1 week.  Vitamin D was removed from your medication list since you indicated you are no longer taking this.      Discharge Medications   Allergies as of 04/25/2016      Reactions   Codeine Other (See Comments)   REACTION: Pt c/o "feeling high"   Morphine Other (See Comments)   REACTION: feels funny and "high"      Medication List    STOP taking these medications   Vitamin D (Cholecalciferol) 1000 units Caps     TAKE these medications   acetaminophen 500 MG tablet Commonly known as:  TYLENOL Take 500-1,000 mg by mouth every 6 (six) hours as needed (for pain/foot pain.).   albuterol 108 (90 Base) MCG/ACT inhaler Commonly known as:  PROVENTIL HFA;VENTOLIN HFA Inhale 2 puffs into the lungs every 6 (six) hours as needed for wheezing or shortness of breath.   aspirin 81 MG EC tablet Take 1 tablet (81 mg total) by mouth daily.   atorvastatin 40 MG tablet Commonly known as:  LIPITOR Take 1 tablet (40 mg total) by mouth daily.   clopidogrel 75 MG tablet Commonly known as:  PLAVIX Take 1 tablet (75 mg total) by mouth daily with breakfast.   hydrochlorothiazide 12.5 MG capsule Commonly known as:  MICROZIDE take 1 capsule by mouth once daily What changed:  See the new instructions.   prednisoLONE acetate 1 % ophthalmic suspension Commonly known as:  PRED FORTE Place 1 drop into both eyes daily.        Allergies:  Allergies  Allergen Reactions  . Codeine Other (See Comments)    REACTION: Pt c/o "feeling high"  . Morphine Other (See Comments)    REACTION: feels funny and "high"     Outstanding Labs/Studies   N/A  Duration of Discharge Encounter   Greater than 30 minutes including physician time.  Signed, Nedra Hai Dunn PA-C 04/25/2016, 8:47 AM   I have seen, examined and evaluated the patient this AM along with Melina Copa, PA.  After reviewing all the available data and chart, we discussed the patients  laboratory, study & physical findings as well as symptoms in detail. I agree with her findings, examination as well as impression recommendations as per our discussion.    Mrs. Snead well following her staged peripheral vascular intervention. When I saw her, she had not yet walk, but by the time of discharge she was able to ambulating without any difficulty. Cath site was stable. Other systems were normal. She was ready for discharge.   Glenetta Hew, M.D., M.S. Interventional Cardiologist   Pager # [phone]  Phone # [phone] [address]. [place], [place] [address]

## 2016-04-25 NOTE — Progress Notes (Signed)
Patient Name: [name] Date of Encounter: 04/25/2016  Primary Cardiologist: [facility] Problem List     Principal Problem:   Critical lower limb ischemia Active Problems:   PVD (peripheral vascular disease) (HCC)   Hyperlipidemia   TOBACCO ABUSE   Essential hypertension   CKD (chronic kidney disease) stage 3, GFR 30-59 ml/min   COPD (chronic obstructive pulmonary disease) (HCC)    Subjective   Reports some bleeding at site last night that has subsequently stopped. Feeling well overall. Has been up ambulating without problem. Wants to go home.  Inpatient Medications    . aspirin EC  81 mg Oral Daily  . atorvastatin  40 mg Oral Daily  . clopidogrel  75 mg Oral Q breakfast  . hydrochlorothiazide  12.5 mg Oral Daily  . nicotine  14 mg Transdermal Daily  . prednisoLONE acetate  1 drop Both Eyes Daily    Vital Signs    Vitals:   04/24/16 1900 04/24/16 2000 04/24/16 2300 04/25/16 0119  BP: (!) 161/50  (!) 155/53 (!) 120/50  Pulse: 76  77 79  Resp: '15 19 18 12  '$ Temp: 97.1 F (36.2 C)   97.1 F (36.2 C)  TempSrc: Oral   Axillary  SpO2: 93%  95% 96%  Weight:    165 lb 2 oz (74.9 kg)  Height:        Intake/Output Summary (Last 24 hours) at 04/25/16 0800 Last data filed at 04/25/16 0300  Gross per 24 hour  Intake           818.75 ml  Output              850 ml  Net           -31.25 ml   Filed Weights   04/24/16 0953 04/25/16 0119  Weight: 156 lb (70.8 kg) 165 lb 2 oz (74.9 kg)    Physical Exam    General: Well developed, well nourished AAF, in no acute distress HEENT: Normocephalic, atraumatic, sclera non-icteric, no xanthomas, nares are without discharge. Neck: Negative for carotid bruits. JVP not elevated. Lungs: Clear bilaterally to auscultation without wheezes, rales, or rhonchi. Breathing is unlabored. Cardiac: RRR S1 S2 without murmurs, rubs, or gallops.  Abdomen: Soft, non-tender, non-distended with normoactive bowel sounds. No  rebound/guarding. Extremities: No clubbing or cyanosis. No edema. Distal pedal pulses are 2+ and equal bilaterally. Skin: Warm and dry, no significant rash. R groin cath site without hematoma, ecchymosis, or bruit. Neuro: Alert and oriented X 3. Sensation in tact. Follows commands. Psych:  Responds to questions appropriately with a normal affect.  Labs    CBC  Recent Labs  04/25/16 0309  WBC 6.3  HGB 10.4*  HCT 32.6*  MCV 97.0  PLT 185   Basic Metabolic Panel  Recent Labs  04/25/16 0309  NA 138  K 4.4  CL 108  CO2 25  GLUCOSE 114*  BUN 13  CREATININE 1.23*  CALCIUM 8.5*    Telemetry    NSR  Patient Profile     75 year old legally blind AA female with a past medical history of HTN, ongoing tobacco abuse, HLD, COPD, CKD stage III, and recently diagnosed PAD (s/p L SFA PTA 04/07/16) who presented for elective staged R SFA PTA. On 04/24/16 she underwent PTA of the R LE with [facility] 1 directional atherectomy mid right SFA, Drug-eluting balloon angioplasty right SFA, PTA right tibioperoneal trunk and posterior tibial artery.   Assessment & Plan  1. PAD - s/p PTA as above. Continue DAPT. Will arrange LE duplex next week and f/u 2-3 weeks thereafter.  2. Mild anemia - suspect r/t site bleeding and hydration. No further bleeding this AM. Can f/u in office.   3. CKD III - stable.  4. HTN - controlled.  Signed, Dana Pitter, PA-C  04/25/2016, 8:00 AM   I have seen, examined and evaluated the patient this AM along with Dana Copa, PA.  After reviewing all the available data and chart, we discussed the patients laboratory, study & physical findings as well as symptoms in detail. I agree with her findings, examination as well as impression recommendations as per our discussion.    [name] well following her staged peripheral vascular intervention. When I saw her, she had not yet walk, but by the time of discharge she was able to ambulating without any difficulty. Cath  site was stable. Other systems were normal. She was ready for discharge.   [name], M.D., M.S. Interventional Cardiologist   Pager # [phone] Phone # [phone] [address] [place], [place] [address]

## 2016-04-28 ENCOUNTER — Telehealth: Payer: Self-pay | Admitting: Cardiovascular Disease

## 2016-04-28 NOTE — Telephone Encounter (Signed)
New Message  Pts caregiver voiced pt has callus on bottom of feet and this is in reference to coming up appt.  Please f/u with pt

## 2016-04-28 NOTE — Telephone Encounter (Signed)
Returned call to patient. She states she has a callus on the bottom of both feet. She states her calluses need trimming as she can hardly walk. She states she "cannot have surgery on her feet until she has blockages cleared out of her thighs." She states she is taking "too many tylenol" given the discomfort from her calluses. She states Dr. Milinda Pointer at [facility] can trim these, if [name] is OK with this.   Message routed to MD

## 2016-04-28 NOTE — Telephone Encounter (Signed)
Communicated MD advice. Patient is very upset that she cannot have her calluses trimmed. Explained that doppler and follow up need to occur prior to clearance for any LE procedure. She has doppler 12/26 and PA OV 05/16/16. Apologized that she is uncomfortable and that nothing can be done sooner.

## 2016-04-28 NOTE — Telephone Encounter (Signed)
I need to see her in the office back first after her recent intervention prior to clearing her for foot surgery

## 2016-04-30 ENCOUNTER — Ambulatory Visit (INDEPENDENT_AMBULATORY_CARE_PROVIDER_SITE_OTHER): Payer: Medicare Other | Admitting: Internal Medicine

## 2016-04-30 ENCOUNTER — Encounter: Payer: Self-pay | Admitting: Internal Medicine

## 2016-04-30 ENCOUNTER — Other Ambulatory Visit: Payer: Self-pay | Admitting: Physician Assistant

## 2016-04-30 VITALS — BP 127/67 | HR 82 | Wt 159.9 lb

## 2016-04-30 DIAGNOSIS — I739 Peripheral vascular disease, unspecified: Secondary | ICD-10-CM

## 2016-04-30 DIAGNOSIS — N183 Chronic kidney disease, stage 3 unspecified: Secondary | ICD-10-CM

## 2016-04-30 DIAGNOSIS — G8929 Other chronic pain: Secondary | ICD-10-CM

## 2016-04-30 DIAGNOSIS — F17200 Nicotine dependence, unspecified, uncomplicated: Secondary | ICD-10-CM

## 2016-04-30 DIAGNOSIS — E785 Hyperlipidemia, unspecified: Secondary | ICD-10-CM

## 2016-04-30 DIAGNOSIS — E1151 Type 2 diabetes mellitus with diabetic peripheral angiopathy without gangrene: Secondary | ICD-10-CM

## 2016-04-30 DIAGNOSIS — Z7982 Long term (current) use of aspirin: Secondary | ICD-10-CM

## 2016-04-30 DIAGNOSIS — I10 Essential (primary) hypertension: Secondary | ICD-10-CM

## 2016-04-30 DIAGNOSIS — L84 Corns and callosities: Secondary | ICD-10-CM

## 2016-04-30 DIAGNOSIS — M79671 Pain in right foot: Secondary | ICD-10-CM

## 2016-04-30 DIAGNOSIS — E559 Vitamin D deficiency, unspecified: Secondary | ICD-10-CM

## 2016-04-30 DIAGNOSIS — E1122 Type 2 diabetes mellitus with diabetic chronic kidney disease: Secondary | ICD-10-CM

## 2016-04-30 DIAGNOSIS — E7849 Other hyperlipidemia: Secondary | ICD-10-CM

## 2016-04-30 DIAGNOSIS — J449 Chronic obstructive pulmonary disease, unspecified: Secondary | ICD-10-CM

## 2016-04-30 DIAGNOSIS — Z9889 Other specified postprocedural states: Secondary | ICD-10-CM

## 2016-04-30 DIAGNOSIS — Z79899 Other long term (current) drug therapy: Secondary | ICD-10-CM

## 2016-04-30 DIAGNOSIS — M79672 Pain in left foot: Secondary | ICD-10-CM

## 2016-04-30 DIAGNOSIS — F17210 Nicotine dependence, cigarettes, uncomplicated: Secondary | ICD-10-CM

## 2016-04-30 DIAGNOSIS — I129 Hypertensive chronic kidney disease with stage 1 through stage 4 chronic kidney disease, or unspecified chronic kidney disease: Secondary | ICD-10-CM | POA: Diagnosis not present

## 2016-04-30 MED ORDER — TRAMADOL HCL 50 MG PO TABS: 50.0000 mg | ORAL_TABLET | Freq: Two times a day (BID) | ORAL | 0 refills | Status: DC | PRN

## 2016-04-30 MED ORDER — HYDROCHLOROTHIAZIDE 12.5 MG PO CAPS: 12.5000 mg | ORAL_CAPSULE | Freq: Every day | ORAL | 6 refills | Status: DC

## 2016-04-30 NOTE — Assessment & Plan Note (Addendum)
Seems to be related to calluses.  She continues to have more pain in the right foot.  She does have a hardened calcified nodule on the bottom of the right foot.  Apparently the podiatrist would like to wait until her vascular issues are resolved before intervening.  Tylenol is not helping with the pain much and she cannot take ibuprofen due to her renal function. She has long history of chronic foot pain and previous surgeries in the site (refer to Dr. Stephenie Acres notes).  I believe this foot pain to be chronic lasting for more than 3 months.    Plan Start Tramadol '50mg'$  BID # 60 to see if this helps.

## 2016-04-30 NOTE — Assessment & Plan Note (Signed)
Discussed today, especially given her vascular disease.  She continues to smoke daily.  The room smelled of smoke today.  She is pre-contemplative for stopping.   Plan Continued cessation counseling.

## 2016-04-30 NOTE — Assessment & Plan Note (Signed)
She has had multiple contrasted studies, last Cr was stable and slightly improved from previous.   Continue to monitor, good blood pressure control.

## 2016-04-30 NOTE — Assessment & Plan Note (Addendum)
BP is well controlled at 127/67 today.  She reports taking her HCTZ as prescribed.  She is having some LE swelling which has been chronic, but also worse since her vascular surgery.  She was supposed to be wearing compression stockings, but was not aware of this today.    Plan Continue HCTZ. Advised keeping her legs up when seated.

## 2016-04-30 NOTE — Assessment & Plan Note (Signed)
She has had 2 procedures in the last few months.  Feet are warm.  Difficult to palpate DP bilaterally.  She has a wound on the bottom of her foot which is causing pain, but no calf pain.   Plan Follow up with vascular surgery as planned Continue aspirin, plavix, statin.

## 2016-04-30 NOTE — Assessment & Plan Note (Signed)
Her vitamin D level was low last year.  She is supposed to be on supplementation with vitamin D daily.  I requested that she get her level checked today, but she declined.   She is currently not taking supplementation.  Will need to further address at next visit.   Plan Check Vitamin D level at next visit.

## 2016-04-30 NOTE — Assessment & Plan Note (Signed)
Last LDL check from 2013 was 108.  I asked for her to have her lipids checked today and she declined b/c she did not think that she had time.  She is on atorvastatin and reports compliance and no ADE's.    Plan Lipid panel at next visit Continue atorvastatin.

## 2016-04-30 NOTE — Patient Instructions (Signed)
[name] - -   Please follow up with [name] as planned.  Once your vascular issues are improved, you should be able to have your calluses trimmed.    For your pain, try tramadol '50mg'$  twice day.  If you have any issues with the medication, please call the clinic.    Thank you!  Come back to see me in 3 months.   Tramadol tablets What is this medicine? TRAMADOL (TRA ma dole) is a pain reliever. It is used to treat moderate to severe pain in adults. This medicine may be used for other purposes; ask your health care provider or pharmacist if you have questions. COMMON BRAND NAME(S): Ultram What should I tell my health care provider before I take this medicine? They need to know if you have any of these conditions: -brain tumor -depression -drug abuse or addiction -head injury -if you frequently drink alcohol containing drinks -kidney disease or trouble passing urine -liver disease -lung disease, asthma, or breathing problems -seizures or epilepsy -suicidal thoughts, plans, or attempt; a previous suicide attempt by you or a family member -an unusual or allergic reaction to tramadol, codeine, other medicines, foods, dyes, or preservatives -pregnant or trying to get pregnant -breast-feeding How should I use this medicine? Take this medicine by mouth with a full glass of water. Follow the directions on the prescription label. You can take it with or without food. If it upsets your stomach, take it with food. Do not take your medicine more often than directed. A special MedGuide will be given to you by the pharmacist with each prescription and refill. Be sure to read this information carefully each time. Talk to your pediatrician regarding the use of this medicine in children. Special care may be needed. Overdosage: If you think you have taken too much of this medicine contact a poison control center or emergency room at once. NOTE: This medicine is only for you. Do not share this  medicine with others. What if I miss a dose? If you miss a dose, take it as soon as you can. If it is almost time for your next dose, take only that dose. Do not take double or extra doses. What may interact with this medicine? Do not take this medication with any of the following medicines: -MAOIs like Carbex, Eldepryl, Marplan, Nardil, and Parnate This medicine may also interact with the following medications: -alcohol -antihistamines for allergy, cough and cold -certain medicines for anxiety or sleep -certain medicines for depression like amitriptyline, fluoxetine, sertraline -certain medicines for migraine headache like almotriptan, eletriptan, frovatriptan, naratriptan, rizatriptan, sumatriptan, zolmitriptan -certain medicines for seizures like carbamazepine, oxcarbazepine, phenobarbital, primidone -certain medicines that treat or prevent blood clots like warfarin -digoxin -furazolidone -general anesthetics like halothane, isoflurane, methoxyflurane, propofol -linezolid -local anesthetics like lidocaine, pramoxine, tetracaine -medicines that relax muscles for surgery -other narcotic medicines for pain or cough -phenothiazines like chlorpromazine, mesoridazine, prochlorperazine, thioridazine -procarbazine This list may not describe all possible interactions. Give your health care provider a list of all the medicines, herbs, non-prescription drugs, or dietary supplements you use. Also tell them if you smoke, drink alcohol, or use illegal drugs. Some items may interact with your medicine. What should I watch for while using this medicine? Tell your doctor or health care professional if your pain does not go away, if it gets worse, or if you have new or a different type of pain. You may develop tolerance to the medicine. Tolerance means that you will need a higher  dose of the medicine for pain relief. Tolerance is normal and is expected if you take this medicine for a long time. Do not  suddenly stop taking your medicine because you may develop a severe reaction. Your body becomes used to the medicine. This does NOT mean you are addicted. Addiction is a behavior related to getting and using a drug for a non-medical reason. If you have pain, you have a medical reason to take pain medicine. Your doctor will tell you how much medicine to take. If your doctor wants you to stop the medicine, the dose will be slowly lowered over time to avoid any side effects. There are different types of narcotic medicines (opiates). If you take more than one type at the same time or if you are taking another medicine that also causes drowsiness, you may have more side effects. Give your health care provider a list of all medicines you use. Your doctor will tell you how much medicine to take. Do not take more medicine than directed. Call emergency for help if you have problems breathing or unusual sleepiness. You may get drowsy or dizzy. Do not drive, use machinery, or do anything that needs mental alertness until you know how this medicine affects you. Do not stand or sit up quickly, especially if you are an older patient. This reduces the risk of dizzy or fainting spells. Alcohol can increase or decrease the effects of this medicine. Avoid alcoholic drinks. You may have constipation. Try to have a bowel movement at least every 2 to 3 days. If you do not have a bowel movement for 3 days, call your doctor or health care professional. Your mouth may get dry. Chewing sugarless gum or sucking hard candy, and drinking plenty of water may help. Contact your doctor if the problem does not go away or is severe. What side effects may I notice from receiving this medicine? Side effects that you should report to your doctor or health care professional as soon as possible: -allergic reactions like skin rash, itching or hives, swelling of the face, lips, or tongue -breathing problems -confusion -seizures -signs and  symptoms of low blood pressure like dizziness; feeling faint or lightheaded, falls; unusually weak or tired -trouble passing urine or change in the amount of urine Side effects that usually do not require medical attention (report to your doctor or health care professional if they continue or are bothersome): -constipation -dry mouth -nausea, vomiting -tiredness This list may not describe all possible side effects. Call your doctor for medical advice about side effects. You may report side effects to FDA at 1-800-FDA-1088. Where should I keep my medicine? Keep out of the reach of children. This medicine may cause accidental overdose and death if it taken by other adults, children, or pets. Mix any unused medicine with a substance like cat litter or coffee grounds. Then throw the medicine away in a sealed container like a sealed bag or a coffee can with a lid. Do not use the medicine after the expiration date. Store at room temperature between 15 and 30 degrees C (59 and 86 degrees F). NOTE: This sheet is a summary. It may not cover all possible information. If you have questions about this medicine, talk to your doctor, pharmacist, or health care provider.  2017 Elsevier/Gold Standard (2015-01-21 09:00:04)

## 2016-04-30 NOTE — Assessment & Plan Note (Signed)
Breathing is stable today.  She reports some wheezing occasionally, but this does not bother her.  She is using albuterol PRN.  She continues to smoke.  We discussed cessation.

## 2016-04-30 NOTE — Progress Notes (Signed)
Tramadol rx called to [facility] pharmacy.

## 2016-04-30 NOTE — Progress Notes (Signed)
   Subjective:    Patient ID: [name], female    DOB: Jul 26, 1940, 75 y.o.   MRN: [id number]  CC: leg pain, foot pain  HPI  [name] is a 75yo woman with HTN, COPD, CKD, HLD and new diagnosis of bilateral PVD.  She also has calluses which cause her bilateral foot pain.  She continues to smoke.  Since I last saw her she was seen by Podiatry who felt she needed a vascular work up.  She was found to have PVD in her bilateral lower extremities and claudication.  She went for left sided atherectomy with angioplasty of the SFA on 11/26.  Post ABI was 0.84.  She has also undergone atherectomy and balloon angioplasty on the right on 12/14. She is due for post procedure ABIs and office visit this month and January.  Since the procedure she has been having ankle swelling, worse on the left.  She notes that she still has pain from a hardened nodule on her foot which she would like removed, but the podiatrist won't see her until she has completed her vascular procedures.  She has tried her friends tramadol which helped and OTC tylenol which did not help.    She was started on atorvastatin, plavix and aspirin.  She is taking them without issue.  She is very concerned about the pain in her feet and this is her main issue that she wants to present today.  She has no calf pain, no chest pain, no SOB today.  She is still smoking and smells of smoke in the clinic today.  She is due for a refill of her HCTZ.  Her BP today is well controlled at 127/67  Review of Systems  Constitutional: Positive for activity change. Negative for chills and fever.  Eyes: Positive for visual disturbance (she is blind).  Respiratory: Negative for cough, choking and shortness of breath.   Cardiovascular: Positive for leg swelling (left leg, non tender). Negative for chest pain.  Genitourinary: Negative for difficulty urinating and dysuria.  Musculoskeletal: Positive for myalgias (foot).  Skin: Positive for wound (calluses on  bottom of right foot).  Neurological: Negative for dizziness and weakness.       Objective:   Physical Exam  Constitutional: She is oriented to person, place, and time.  Elderly woman, NAD  HENT:  Head: Atraumatic.  Eyes: Right eye exhibits no discharge. Left eye exhibits no discharge.  Vision is decreased.   Cardiovascular: Normal rate, regular rhythm and normal heart sounds.   Pulmonary/Chest: Effort normal and breath sounds normal. No respiratory distress.  Musculoskeletal: She exhibits edema (Left > right, non pitting to the ankles on the right and mid calf on left).  She has swelling, but there is no warmth or tenderness of the leg.   Neurological: She is alert and oriented to person, place, and time.  Skin: Skin is warm and dry. No erythema.  Psychiatric: She has a normal mood and affect. Her behavior is normal.    Requested lipid panel and vitamin D today but she declined blood work.        Assessment & Plan:  RTC in 3 months

## 2016-05-06 ENCOUNTER — Encounter (HOSPITAL_COMMUNITY): Payer: Medicare Other

## 2016-05-16 ENCOUNTER — Ambulatory Visit (INDEPENDENT_AMBULATORY_CARE_PROVIDER_SITE_OTHER): Payer: Medicare Other | Admitting: Physician Assistant

## 2016-05-16 ENCOUNTER — Encounter: Payer: Self-pay | Admitting: Physician Assistant

## 2016-05-16 VITALS — BP 134/84 | HR 75 | Ht 66.0 in | Wt 155.2 lb

## 2016-05-16 DIAGNOSIS — I739 Peripheral vascular disease, unspecified: Secondary | ICD-10-CM | POA: Diagnosis not present

## 2016-05-16 DIAGNOSIS — F17200 Nicotine dependence, unspecified, uncomplicated: Secondary | ICD-10-CM

## 2016-05-16 DIAGNOSIS — I10 Essential (primary) hypertension: Secondary | ICD-10-CM

## 2016-05-16 NOTE — Patient Instructions (Addendum)
Medication Instructions:  NO CHANGES  If you need a refill on your cardiac medications before your next appointment, please call your pharmacy.  Labwork: NONE  Testing/Procedures: SCHEDULE ABI'S THAT ARE ORDERED  Follow-Up: Your physician recommends that you schedule a follow-up appointment in: 3 MONTHS WITH DR BERRY  PLEASE SCHEDULE DR Milinda Pointer APPOINTMENT AT [place] ASAP AFTER SCHEDULED ABI'S-PER DR [place]!!  Thank you for choosing [facility] at [employer], LPN RHONDA BARRETT, PA-C

## 2016-05-16 NOTE — Progress Notes (Signed)
Cardiology Office Note   Date:  05/16/2016   ID:  [name], DOB [birth year], MRN [id number]  PCP:  Gilles Chiquito, MD  Cardiologist:  Dr Stacy Gardner, PA-C   Chief Complaint  Patient presents with  . Follow-up    hospital visit 2-3 weeks ago  . Medical Clearance    foot surgery    History of Present Illness: [name] is a 76 y.o. female with a history of HTN, COPD, CKD, HLD and new diagnosis of bilateral PVD, DVT  12/14, PV angio to rx R-SFA, prox/dist L-SFA had already been treated. Post-op ABIs not completed.  [name] presents for Post hospital evaluation.  Since discharge from the hospital, she has done pretty well. She has not had any chest pain, or shortness of breath. Her legs do not hurt. She is still having bilateral foot pain, but this is mainly in the area of the calluses. She wants to go back to the foot Dr. as soon as possible. She has not had her follow-up ABIs. An attempt was made to schedule them, but the patient canceled.  She has not had numbness or tingling in her feet. She has a small amount of lower extremity edema, but it seems mostly daytime. She states she is compliant with her medications. She is using tramadol to control the pain in her feet.  She is still smoking and does not think she can quit. She states that she has tried to cut back but it is very difficult.   Past Medical History:  Diagnosis Date  . Arthritis    "left shoulder" (04/07/2016)  . Blind    s/p bilateral corneal transplant; "legally blind in both eyes" (04/07/2016)  . CKD (chronic kidney disease), stage III   . COPD (chronic obstructive pulmonary disease) ([place])   . DVT (deep venous thrombosis) ([place]) "years ago"   "right thigh"    . HEARING LOSS   . Hyperlipidemia   . Hypertension   . PAD (peripheral artery disease) ([place])    a. 03/2016 Periph Angio: L SFA 99p, 42mw/ one vessel runoff. R SFA 1067m/ one vessel runofff;  b. 04/07/2016 PTA L SFA  (Hawk 1 LS atherecromty w/ DEBA). c. 04/24/16 s/p PTA to R SFA, right tibioperoneal trunk and posterior tibial artery.  . Phlebitis   . Tobacco abuse     Past Surgical History:  Procedure Laterality Date  . CARPAL TUNNEL RELEASE Right 04/2003   /nArchie Endo/16/2012  . EYE SURGERY Bilateral    s/p bilateral corneal transplant  . FRACTURE SURGERY    . HAMMER TOE SURGERY Left 01/2010   /nArchie Endo/16/2011  . [place] "[place] a [place]"  . PERIPHERAL VASCULAR CATHETERIZATION N/A 03/17/2016   Procedure: Lower Extremity Intervention;  Surgeon: JoLorretta HarpMD;  [facility];  Service: Cardiovascular;  Laterality: N/A;  . PERIPHERAL VASCULAR CATHETERIZATION Bilateral 04/07/2016   Procedure: Lower Extremity Angiography;  Surgeon: JoLorretta HarpMD;  [facility];  Service: Cardiovascular;  Laterality: Bilateral;  . PERIPHERAL VASCULAR CATHETERIZATION Left 04/07/2016   Procedure: Peripheral Vascular Atherectomy;  Surgeon: JoLorretta HarpMD;  [facility];  Service: Cardiovascular;  Laterality: Left;  SFA  . PERIPHERAL VASCULAR CATHETERIZATION  04/24/2016  . PERIPHERAL VASCULAR CATHETERIZATION Right 04/24/2016   Procedure: Peripheral Vascular Atherectomy;  Surgeon: JoLorretta HarpMD;  [facility];  Service: Cardiovascular;  Laterality: Right;  superficial femoral  . PERIPHERAL VASCULAR CATHETERIZATION Right 04/24/2016   Procedure: Peripheral Vascular Balloon Angioplasty;  Surgeon: Lorretta Harp, MD;  Location: [place] [facility];  Service: Cardiovascular;  Laterality: Right;  Tibeoperoneal trunk and posterior tibial  . SHOULDER ARTHROSCOPY W/ ROTATOR CUFF REPAIR Right 09/2002   Archie Endo 09/25/2010  . SHOULDER OPEN ROTATOR CUFF REPAIR Right 1973   secondary numbness in R hand  . SHOULDER OPEN ROTATOR CUFF REPAIR Left 06/2003   Archie Endo 09/25/2010  . WRIST FRACTURE SURGERY Right     Current Outpatient  Prescriptions  Medication Sig Dispense Refill  . acetaminophen (TYLENOL) 500 MG tablet Take 500-1,000 mg by mouth every 6 (six) hours as needed (for pain/foot pain.).     Marland Kitchen albuterol (PROVENTIL HFA;VENTOLIN HFA) 108 (90 Base) MCG/ACT inhaler Inhale 2 puffs into the lungs every 6 (six) hours as needed for wheezing or shortness of breath. 1 Inhaler 6  . aspirin EC 81 MG EC tablet Take 1 tablet (81 mg total) by mouth daily.    Marland Kitchen atorvastatin (LIPITOR) 40 MG tablet Take 1 tablet (40 mg total) by mouth daily. 30 tablet 11  . clopidogrel (PLAVIX) 75 MG tablet Take 1 tablet (75 mg total) by mouth daily with breakfast. 30 tablet 12  . hydrochlorothiazide (MICROZIDE) 12.5 MG capsule Take 1 capsule (12.5 mg total) by mouth daily. 30 capsule 6  . prednisoLONE acetate (PRED FORTE) 1 % ophthalmic suspension Place 1 drop into both eyes daily.    . traMADol (ULTRAM) 50 MG tablet Take 1 tablet (50 mg total) by mouth every 12 (twelve) hours as needed for severe pain. 60 tablet 0   No current facility-administered medications for this visit.     Allergies:   Codeine and Morphine    Social History:  The patient  reports that she has been smoking Cigarettes.  She has a 17.50 pack-year smoking history. She has never used smokeless tobacco. She reports that she does not drink alcohol or use drugs.   Family History:  [name] family history includes Cancer in her brother, maternal grandmother, maternal uncle, mother, and sister.    ROS:  Please see the history of present illness. All other systems are reviewed and negative.    PHYSICAL EXAM: VS:  BP 134/84   Pulse 75   Ht '5\' 6"'$  (1.676 m)   Wt 155 lb 3.2 oz (70.4 kg)   LMP  (LMP Unknown)   BMI 25.05 kg/m  , BMI Body mass index is 25.05 kg/m. GEN: Well nourished, well developed, female in no acute distress  HEENT: normal for age  Neck: no JVD, no carotid bruit, no masses Cardiac: RRR; no murmur, no rubs, or gallops Respiratory: Decreased breath  sounds bases, few scattered rales bilaterally, minimal expiratory wheeze normal work of breathing GI: soft, nontender, nondistended, + BS MS: no deformity or atrophy; no edema; distal pulses are 2+ in upper extremities; decreased but palpable in both feet   Skin: warm and dry, no rash Neuro:  Strength and sensation are intact Psych: euthymic mood, full affect   EKG:  EKG is not ordered today.  Recent Labs: 08/15/2015: TSH 2.260 04/06/2016: ALT 7 04/25/2016: BUN 13; Creatinine, Ser 1.23; Hemoglobin 10.4; Platelets 210; Potassium 4.4; Sodium 138    Lipid Panel    Component Value Date/Time   CHOL 190 08/15/2011 1116   TRIG 80 08/15/2011 1116   HDL 66 08/15/2011 1116   CHOLHDL 2.9 08/15/2011 1116  VLDL 16 08/15/2011 1116   LDLCALC 108 (H) 08/15/2011 1116     Wt Readings from Last 3 Encounters:  05/16/16 155 lb 3.2 oz (70.4 kg)  04/30/16 159 lb 14.4 oz (72.5 kg)  04/25/16 165 lb 2 oz (74.9 kg)     Other studies Reviewed: Additional studies/ records that were reviewed today include: Office notes, hospital records and procedure notes.  ASSESSMENT AND PLAN:  1.  PAD: By exam, her distal circulation is much improved. Her symptoms are also improved, but she is having a great deal of pain from the calloused areas on the bottom of her foot. We will check follow-up Dopplers to confirm the results of the procedure, and refer her back to Dr. Milinda Pointer for further management of her foot problems. She is to continue the aspirin and Plavix.  2. Tobacco use: She is encouraged to continue to decrease and quit smoking. Her motivation is not very high.  3. Hypertension: Her blood pressure control is adequate at this time. No med changes. She states that her blood pressure is a little high today because she has not taken her diuretics since she had a doctor's appointment.   Current medicines are reviewed at length with the patient today.  The patient does not have concerns regarding  medicines.  The following changes have been made:  no change  Labs/ tests ordered today include:  No orders of the defined types were placed in this encounter.    Disposition:   FU with [name] and Dr. Milinda Pointer  Signed, [place], [place], [place]  05/16/2016 4:34 PM    [place] Phone: [phone]; Fax: [phone]  This note was written with the assistance of speech recognition software. Please excuse any transcriptional errors.

## 2016-05-22 ENCOUNTER — Telehealth: Payer: Self-pay | Admitting: Podiatry

## 2016-05-22 NOTE — Telephone Encounter (Signed)
FYI Dr Kennon Holter office called asking for [place] to get pt scheduled far a follow up after dopplar studies on 1.24..  I left a message for pt to call to schedule an appt on 1.30.18.

## 2016-05-23 ENCOUNTER — Other Ambulatory Visit: Payer: Self-pay | Admitting: Cardiovascular Disease

## 2016-05-23 DIAGNOSIS — I739 Peripheral vascular disease, unspecified: Secondary | ICD-10-CM

## 2016-05-23 DIAGNOSIS — I779 Disorder of arteries and arterioles, unspecified: Secondary | ICD-10-CM

## 2016-05-27 ENCOUNTER — Telehealth: Payer: Self-pay | Admitting: Cardiovascular Disease

## 2016-05-27 NOTE — Telephone Encounter (Signed)
Left message for patient to let her know her appt with Dr. Milinda Pointer had been rescheduled from January 18 to January 30 - after her arterials are done on January 24.

## 2016-05-29 ENCOUNTER — Ambulatory Visit: Payer: Medicare Other | Admitting: Podiatry

## 2016-06-04 ENCOUNTER — Ambulatory Visit (HOSPITAL_COMMUNITY)
Admission: RE | Admit: 2016-06-04 | Discharge: 2016-06-04 | Disposition: A | Discharge status: Home / Self Care | Payer: Medicare Other | Source: Ambulatory Visit | Attending: Cardiovascular Disease | Admitting: Cardiovascular Disease

## 2016-06-04 DIAGNOSIS — R9439 Abnormal result of other cardiovascular function study: Secondary | ICD-10-CM | POA: Diagnosis not present

## 2016-06-04 DIAGNOSIS — I739 Peripheral vascular disease, unspecified: Secondary | ICD-10-CM | POA: Diagnosis not present

## 2016-06-04 DIAGNOSIS — I779 Disorder of arteries and arterioles, unspecified: Secondary | ICD-10-CM | POA: Diagnosis not present

## 2016-06-04 DIAGNOSIS — I743 Embolism and thrombosis of arteries of the lower extremities: Secondary | ICD-10-CM | POA: Diagnosis not present

## 2016-06-10 ENCOUNTER — Ambulatory Visit: Payer: Medicare Other | Admitting: Podiatry

## 2016-06-12 ENCOUNTER — Other Ambulatory Visit: Payer: Self-pay

## 2016-06-12 ENCOUNTER — Other Ambulatory Visit: Payer: Self-pay | Admitting: Cardiovascular Disease

## 2016-06-12 ENCOUNTER — Telehealth: Payer: Self-pay | Admitting: Cardiovascular Disease

## 2016-06-12 DIAGNOSIS — I739 Peripheral vascular disease, unspecified: Secondary | ICD-10-CM

## 2016-06-12 MED ORDER — TRAMADOL HCL 50 MG PO TABS: 50.0000 mg | ORAL_TABLET | Freq: Two times a day (BID) | ORAL | 0 refills | Status: DC | PRN

## 2016-06-12 NOTE — Telephone Encounter (Signed)
-----   Message from Lorretta Harp, MD sent at 06/05/2016 11:17 AM EST ----- Normal ABIs bilaterally status post intervention. Repeat 6 months

## 2016-06-12 NOTE — Telephone Encounter (Signed)
traMADol (ULTRAM) 50 MG tablet, refill request @ [facility] on [address]

## 2016-06-12 NOTE — Telephone Encounter (Signed)
Rx last written 04/30/16.

## 2016-06-12 NOTE — Telephone Encounter (Signed)
Tramadol rx called to [facility].

## 2016-06-12 NOTE — Telephone Encounter (Signed)
Results given to pt. Pt verbalized understanding. Repeat order entered.

## 2016-06-19 ENCOUNTER — Ambulatory Visit (INDEPENDENT_AMBULATORY_CARE_PROVIDER_SITE_OTHER): Payer: Medicare Other | Admitting: Podiatry

## 2016-06-19 ENCOUNTER — Encounter: Payer: Self-pay | Admitting: Podiatry

## 2016-06-19 DIAGNOSIS — M79676 Pain in unspecified toe(s): Secondary | ICD-10-CM

## 2016-06-19 DIAGNOSIS — Q828 Other specified congenital malformations of skin: Secondary | ICD-10-CM | POA: Diagnosis not present

## 2016-06-19 DIAGNOSIS — B351 Tinea unguium: Secondary | ICD-10-CM | POA: Diagnosis not present

## 2016-06-22 NOTE — Progress Notes (Signed)
She presents today to complaint of painful elongated toenails with corns and calluses for aspect bilateral foot.  Objective: Vital signs are stable alert and oriented 3 pulses are barely palpable bilateral but Dopplers demonstrate minimal occlusive disease.  Assessment: Painful elongated toenails 1 through 5 bilateral. Porokeratosis bilateral.  Plan: Debridement of all reactive hyperkeratosis and debridement of toenails 1 through 5 bilateral.

## 2016-06-27 ENCOUNTER — Encounter: Payer: Self-pay | Admitting: *Deleted

## 2016-07-10 NOTE — Addendum Note (Signed)
Addended by: Gilles Chiquito B on: 07/10/2016 11:16 AM   Modules accepted: Orders

## 2016-07-23 ENCOUNTER — Telehealth: Payer: Self-pay | Admitting: *Deleted

## 2016-07-23 NOTE — Telephone Encounter (Signed)
She will need to be seen.  It is not clear what her pain is from.  She has severe PAD and recently had vascular surgery.  Her pain could be related to this.  Would have her come in to be seen in resident clinic.

## 2016-07-23 NOTE — Telephone Encounter (Signed)
Pt calls and states the podiatrist stated to her that there is nothing he can do to help her foot pain, that she will need to talk to her pcp for something stronger to ease her pain She would like for you to prescribe something stronger than tramadol, she is in great pain. Please advise

## 2016-07-23 NOTE — Telephone Encounter (Signed)
Scheduled appt for Friday 3/16 at 1315

## 2016-07-25 ENCOUNTER — Ambulatory Visit (INDEPENDENT_AMBULATORY_CARE_PROVIDER_SITE_OTHER): Payer: Medicare Other | Admitting: Internal Medicine

## 2016-07-25 ENCOUNTER — Encounter: Payer: Self-pay | Admitting: Internal Medicine

## 2016-07-25 VITALS — BP 133/68 | HR 78 | Temp 97.6°F | Wt 152.1 lb

## 2016-07-25 DIAGNOSIS — M2042 Other hammer toe(s) (acquired), left foot: Secondary | ICD-10-CM

## 2016-07-25 DIAGNOSIS — M79671 Pain in right foot: Secondary | ICD-10-CM

## 2016-07-25 DIAGNOSIS — M2041 Other hammer toe(s) (acquired), right foot: Secondary | ICD-10-CM

## 2016-07-25 DIAGNOSIS — B351 Tinea unguium: Secondary | ICD-10-CM

## 2016-07-25 DIAGNOSIS — F17200 Nicotine dependence, unspecified, uncomplicated: Secondary | ICD-10-CM

## 2016-07-25 DIAGNOSIS — L84 Corns and callosities: Secondary | ICD-10-CM

## 2016-07-25 DIAGNOSIS — M79672 Pain in left foot: Secondary | ICD-10-CM | POA: Diagnosis not present

## 2016-07-25 MED ORDER — NICOTINE POLACRILEX 2 MG MT GUM: 2.0000 mg | CHEWING_GUM | OROMUCOSAL | 0 refills | Status: DC | PRN

## 2016-07-25 MED ORDER — NICOTINE 14 MG/24HR TD PT24: 14.0000 mg | MEDICATED_PATCH | TRANSDERMAL | 0 refills | Status: AC

## 2016-07-25 MED ORDER — NICOTINE 21 MG/24HR TD PT24: 21.0000 mg | MEDICATED_PATCH | TRANSDERMAL | 2 refills | Status: AC

## 2016-07-25 MED ORDER — NICOTINE 7 MG/24HR TD PT24: 7.0000 mg | MEDICATED_PATCH | TRANSDERMAL | 0 refills | Status: AC

## 2016-07-25 MED ORDER — DICLOFENAC SODIUM 1 % TD GEL: 4.0000 g | Freq: Four times a day (QID) | TRANSDERMAL | 2 refills | Status: DC

## 2016-07-25 NOTE — Progress Notes (Signed)
   CC: foot pain  HPI:  [name] is a 76 y.o. with pmh as listed below is here for foot pain.  Has been seeing podiatry for her calluses and onychomycosis. Had debridement of the calluses by podiatry. Has been on tramadol without any improvement of the pain over her calluses, requesting something else. Per patient, podiatry did not want to do surgery to correct her foot abnormality.   Past Medical History:  Diagnosis Date  . Arthritis    "left shoulder" (04/07/2016)  . Blind    s/p bilateral corneal transplant; "legally blind in both eyes" (04/07/2016)  . CKD (chronic kidney disease), stage III   . COPD (chronic obstructive pulmonary disease) ([place])   . DVT (deep venous thrombosis) ([place]) "years ago"   "right thigh"    . HEARING LOSS   . Hyperlipidemia   . Hypertension   . PAD (peripheral artery disease) ([place])    a. 03/2016 Periph Angio: L SFA 99p, 43mw/ one vessel runoff. R SFA 1031m/ one vessel runofff;  b. 04/07/2016 PTA L SFA (Hawk 1 LS atherecromty w/ DEBA). c. 04/24/16 s/p PTA to R SFA, right tibioperoneal trunk and posterior tibial artery.  . Phlebitis   . Tobacco abuse     Review of Systems:   Review of Systems  Constitutional: Negative for chills and fever.  Cardiovascular: Negative for chest pain.  Gastrointestinal: Negative for nausea and vomiting.  Musculoskeletal:       Foot pain  Neurological: Negative for dizziness.  Psychiatric/Behavioral: Negative for depression.     Physical Exam:  Vitals:   07/25/16 1326  BP: 133/68  Pulse: 78  Temp: 97.6 F (36.4 C)  TempSrc: Oral  SpO2: 97%  Weight: 152 lb 1.6 oz (69 kg)   Physical Exam  Constitutional: She appears well-developed and well-nourished. No distress.  Cardiovascular: Normal rate and regular rhythm.  Exam reveals no gallop and no friction rub.   No murmur heard. Respiratory: Effort normal and breath sounds normal. No respiratory distress. She has no wheezes.  Musculoskeletal: Normal  range of motion.  Has calluses on plantar surface under 2nd-3rd MTP on both feet. Has pain with light touch over this hyperkeratotic area. No signs of ischemia or erythema or infection.  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   Skin: She is not diaphoretic.    Assessment & Plan:   See Encounters Tab for problem based charting.  Patient discussed with Dr. ViEvette Doffing

## 2016-07-25 NOTE — Assessment & Plan Note (Addendum)
Her pain seems to be coming from calluses which usually should not cause any pain. Even to light touch over her calluses causes pain. No pain anywhere else. May also have a component of Morton's neuroma.  - will prescribe voltaren gel. - stop tramadol - f/up with podiatry and discuss surgery when possible for her hammer toe changes.

## 2016-07-25 NOTE — Patient Instructions (Signed)
Stop tramadol.  Use voltaren gel upto 4 times a day on your feet for pain.  Nicotine patch (21 mg/day) for six weeks, followed by 14 mg/day for two weeks, and finish with 7 mg/day for two weeks.  Follow up with Dr. Daryll Drown.  Nicotine skin patches What is this medicine? NICOTINE ([place] teen) helps people stop smoking. The patches replace the nicotine found in cigarettes and help to decrease withdrawal effects. They are most effective when used in combination with a stop-smoking program. This medicine may be used for other purposes; ask your health care provider or pharmacist if you have questions. COMMON BRAND NAME(S): Habitrol, Nicoderm CQ, Nicotrol What should I tell my health care provider before I take this medicine? They need to know if you have any of these conditions: -diabetes -heart disease, angina, irregular heartbeat or previous heart attack -high blood pressure -lung disease, including asthma -overactive thyroid -pheochromocytoma -seizures or a history of seizures -skin problems, like eczema -stomach problems or ulcers -an unusual or allergic reaction to nicotine, adhesives, other medicines, foods, dyes, or preservatives -pregnant or trying to get pregnant -breast-feeding How should I use this medicine? This medicine is for use on the skin. Follow the directions that come with the patches. Find an area of skin on your upper arm, chest, or back that is clean, dry, greaseless, undamaged and hairless. Wash hands with plain soap and water. Do not use anything that contains aloe, lanolin or glycerin as these may prevent the patch from sticking. Dry thoroughly. Remove the patch from the sealed pouch. Do not try to cut or trim the patch. Using your palm, press the patch firmly in place for 10 seconds to make sure that there is good contact with your skin. After applying the patch, wash your hands. Change the patch every day, keeping to a regular schedule. When you apply a new patch,  use a new area of skin. Wait at least 1 week before using the same area again. Talk to your pediatrician regarding the use of this medicine in children. Special care may be needed. Overdosage: If you think you have taken too much of this medicine contact a poison control center or emergency room at once. NOTE: This medicine is only for you. Do not share this medicine with others. What if I miss a dose? If you forget to replace a patch, use it as soon as you can. Only use one patch at a time and do not leave on the skin for longer than directed. If a patch falls off, you can replace it, but keep to your schedule and remove the patch at the right time. What may interact with this medicine? -medicines for asthma -medicines for blood pressure -medicines for mental depression This list may not describe all possible interactions. Give your health care provider a list of all the medicines, herbs, non-prescription drugs, or dietary supplements you use. Also tell them if you smoke, drink alcohol, or use illegal drugs. Some items may interact with your medicine. What should I watch for while using this medicine? You should begin using the nicotine patch the day you stop smoking. It is okay if you do not succeed at your attempt to quit and have a cigarette. You can still continue your quit attempt and keep using the product as directed. Just throw away your cigarettes and get back to your quit plan. You can keep the patch in place during swimming, bathing, and showering. If your patch falls off during these  activities, replace it. When you first apply the patch, your skin may itch or burn. This should go away soon. When you remove a patch, the skin may look red, but this should only last for a few days. Call your doctor or health care professional if skin redness does not go away after 4 days, if your skin swells, or if you get a rash. If you are a diabetic and you quit smoking, the effects of insulin may be  increased and you may need to reduce your insulin dose. Check with your doctor or health care professional about how you should adjust your insulin dose. If you are going to have a magnetic resonance imaging (MRI) procedure, tell your MRI technician if you have this patch on your body. It must be removed before a MRI. What side effects may I notice from receiving this medicine? Side effects that you should report to your doctor or health care professional as soon as possible: -allergic reactions like skin rash, itching or hives, swelling of the face, lips, or tongue -breathing problems -changes in hearing -changes in vision -chest pain -cold sweats -confusion -fast, irregular heartbeat -feeling faint or lightheaded, falls -headache -increased saliva -skin redness that lasts more than 4 days -stomach pain -signs and symptoms of nicotine overdose like nausea; vomiting; dizziness; weakness; and rapid heartbeat Side effects that usually do not require medical attention (report to your doctor or health care professional if they continue or are bothersome): -diarrhea -dry mouth -hiccups -irritability -nervousness or restlessness -trouble sleeping or vivid dreams This list may not describe all possible side effects. Call your doctor for medical advice about side effects. You may report side effects to FDA at 1-800-FDA-1088. Where should I keep my medicine? Keep out of the reach of children. Store at room temperature between 20 and 25 degrees C (68 and 77 degrees F). Protect from heat and light. Store in International aid/development worker until ready to use. Throw away unused medicine after the expiration date. When you remove a patch, fold with sticky sides together; put in an empty opened pouch and throw away. NOTE: This sheet is a summary. It may not cover all possible information. If you have questions about this medicine, talk to your doctor, pharmacist, or health care provider.  2018 Elsevier/Gold  Standard (2014-03-27 15:46:21)

## 2016-07-25 NOTE — Progress Notes (Signed)
Internal Medicine Clinic Attending  Case discussed with Dr. Ahmed at the time of the visit.  We reviewed the resident's history and exam and pertinent patient test results.  I agree with the assessment, diagnosis, and plan of care documented in the resident's note. 

## 2016-07-25 NOTE — Assessment & Plan Note (Signed)
Smoking 1/2 pack a day. Interested in quitting. Discussed nicotine patch and gum. Will prescribe these.

## 2016-07-30 ENCOUNTER — Ambulatory Visit: Payer: Medicare Other | Admitting: Internal Medicine

## 2016-07-30 ENCOUNTER — Telehealth: Payer: Self-pay | Admitting: *Deleted

## 2016-07-30 NOTE — Telephone Encounter (Signed)
Information was sent to CoverMyMeds for [place] for Diclofenac Gel.  Approved thru 05/11/2017 .  [facility] was called at [phone] and notified of the approval.   Sander Nephew, RN 07/30/2016 11:50 AM.

## 2016-08-06 ENCOUNTER — Other Ambulatory Visit: Payer: Self-pay | Admitting: Internal Medicine

## 2016-08-06 ENCOUNTER — Other Ambulatory Visit: Payer: Self-pay | Admitting: *Deleted

## 2016-08-06 NOTE — Telephone Encounter (Signed)
This was stopped by Dr. Genene Churn, will refuse.

## 2016-08-06 NOTE — Telephone Encounter (Signed)
Request sent to dr Daryll Drown

## 2016-08-06 NOTE — Telephone Encounter (Signed)
Asking for refill of tramadol

## 2016-08-13 ENCOUNTER — Ambulatory Visit: Payer: Medicare Other | Admitting: Internal Medicine

## 2016-08-19 ENCOUNTER — Ambulatory Visit (INDEPENDENT_AMBULATORY_CARE_PROVIDER_SITE_OTHER): Payer: Medicare Other | Admitting: Internal Medicine

## 2016-08-19 VITALS — BP 130/67 | HR 75 | Temp 98.7°F | Resp 18 | Wt 150.9 lb

## 2016-08-19 DIAGNOSIS — N183 Chronic kidney disease, stage 3 unspecified: Secondary | ICD-10-CM

## 2016-08-19 DIAGNOSIS — H9190 Unspecified hearing loss, unspecified ear: Secondary | ICD-10-CM

## 2016-08-19 DIAGNOSIS — Z7902 Long term (current) use of antithrombotics/antiplatelets: Secondary | ICD-10-CM | POA: Diagnosis not present

## 2016-08-19 DIAGNOSIS — F17210 Nicotine dependence, cigarettes, uncomplicated: Secondary | ICD-10-CM | POA: Diagnosis not present

## 2016-08-19 DIAGNOSIS — E784 Other hyperlipidemia: Secondary | ICD-10-CM | POA: Diagnosis not present

## 2016-08-19 DIAGNOSIS — L84 Corns and callosities: Secondary | ICD-10-CM | POA: Diagnosis not present

## 2016-08-19 DIAGNOSIS — M79672 Pain in left foot: Secondary | ICD-10-CM

## 2016-08-19 DIAGNOSIS — E559 Vitamin D deficiency, unspecified: Secondary | ICD-10-CM

## 2016-08-19 DIAGNOSIS — I739 Peripheral vascular disease, unspecified: Secondary | ICD-10-CM

## 2016-08-19 DIAGNOSIS — E7849 Other hyperlipidemia: Secondary | ICD-10-CM

## 2016-08-19 DIAGNOSIS — E785 Hyperlipidemia, unspecified: Secondary | ICD-10-CM | POA: Diagnosis not present

## 2016-08-19 DIAGNOSIS — M79671 Pain in right foot: Secondary | ICD-10-CM

## 2016-08-19 DIAGNOSIS — I10 Essential (primary) hypertension: Secondary | ICD-10-CM | POA: Diagnosis not present

## 2016-08-19 DIAGNOSIS — J449 Chronic obstructive pulmonary disease, unspecified: Secondary | ICD-10-CM

## 2016-08-19 DIAGNOSIS — Z79899 Other long term (current) drug therapy: Secondary | ICD-10-CM

## 2016-08-19 DIAGNOSIS — Z9862 Peripheral vascular angioplasty status: Secondary | ICD-10-CM

## 2016-08-19 DIAGNOSIS — Z7982 Long term (current) use of aspirin: Secondary | ICD-10-CM | POA: Diagnosis not present

## 2016-08-19 DIAGNOSIS — I129 Hypertensive chronic kidney disease with stage 1 through stage 4 chronic kidney disease, or unspecified chronic kidney disease: Secondary | ICD-10-CM

## 2016-08-19 DIAGNOSIS — H547 Unspecified visual loss: Secondary | ICD-10-CM

## 2016-08-19 MED ORDER — TRAMADOL HCL 50 MG PO TABS: 50.0000 mg | ORAL_TABLET | Freq: Two times a day (BID) | ORAL | 0 refills | Status: DC | PRN

## 2016-08-19 NOTE — Progress Notes (Signed)
   Subjective:    Patient ID: [name], female    DOB: 05-18-1940, 76 y.o.   MRN: [id number]  CC: 3 month follow up for HTN, COPD  HPI   [name] is a 76yo woman with PMH of HTN, PVD, COPD, CKD, HLD who presents for follow up of her HTN and COPD.  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  She comes in with a Psychologist, sport and exercise.    [name] reports that her main issue continues to be foot pain.  She was seen by me for this a few months ago and was given tramadol which she reported helped some.  She was seen in clinic about a month ago and was given voltaren gel.  It seems that her calluses are her biggest problem at this point.  She has also had PVD and atherectomy and angioplasty on the left and ride sides in November and December respectively. Now her leg pain is better, but the pain in her calluses is still there.  She understands that her podiatrist will not do anything for her, but also said that he only shaves down her calluses a few times per year.  On exam, her pain is localized to two calluses on the plantar surface around the 3rd or 4th metatarsal head bilaterally.  She is due to go back to podiatry in May.   She further notes that she is willing to quit smoking, but she could not afford the nicoderm patches.   Finally, she asked me if she could stop plavix, but I advised her to continue and follow up with [name] in the next 1-2 months to further discuss.    Review of Systems  Constitutional: Negative for activity change and fatigue.  Eyes: Positive for visual disturbance.  Respiratory: Negative for cough and shortness of breath.   Cardiovascular: Negative for leg swelling.  Musculoskeletal: Positive for gait problem (foot pain).  Skin:       Calluses on the bottom of feet       Objective:   Physical Exam  Constitutional: She is oriented to person, place, and time. She appears well-developed and well-nourished. No distress.  HENT:  Head: Normocephalic and  atraumatic.  Hearing aids bilaterally  Eyes: Right eye exhibits no discharge. Left eye exhibits no discharge.  Cloudy lens'.  Anicteric sclerae  Cardiovascular: Normal rate, regular rhythm and normal heart sounds.   Pulmonary/Chest: Effort normal and breath sounds normal. No respiratory distress.  Musculoskeletal: She exhibits tenderness (to calluses on the plantar surface of the feet.  ). She exhibits no edema.  Neurological: She is alert and oriented to person, place, and time.  Skin:  She has calluses on the plantar surface near the 3rd or 4th metatarsal heads bilaterally, right is thicker and more tender than left.  Skin is thin.  Feet are warm.   Psychiatric: She has a normal mood and affect. Her behavior is normal.    Lipid panel, vitamin D, BMET, CBC today.      Assessment & Plan:  RTC in 3 months.

## 2016-08-19 NOTE — Assessment & Plan Note (Signed)
She is consistent in stating that the pain is due to her calluses.  She is wearing closed toe footwear with good padding.  She has tried tramadol with some relief and voltaren gel with some relief.  I advised her to try the two together and possibly try '100mg'$  of tramadol as she tolerated the '50mg'$  dose.    Continue voltaren Re-Trial of tramadol (should not take NSAIDs, do not want to try oral narcotics of a higher intensity at this time).  Podiatry follow up.

## 2016-08-19 NOTE — Assessment & Plan Note (Signed)
Check Vitamin D level today.  She is not on supplementation.

## 2016-08-19 NOTE — Assessment & Plan Note (Signed)
BP today is 130/67.  She is only on HCTZ with good results.   Plan BMET today Continue hctz

## 2016-08-19 NOTE — Assessment & Plan Note (Signed)
Well controlled today.  She has no complaints of SOB, wheezing or cough.  She does continue to smoke, but she reports willingness to quit if she can afford the patches.   Look into price of patches, see if we can get to her for free Counseled on smoking cessation Continue PRN albuterol.

## 2016-08-19 NOTE — Assessment & Plan Note (Signed)
She is on atorvastatin due to her PVD.  Check Lipid panel today.  Continue atorvastatin.

## 2016-08-19 NOTE — Assessment & Plan Note (Signed)
She is doing well, no apparent wounds to the feet, feet are warm to the touch.  She should follow up with [name] in the next 1-2 months  Continue plavix, aspirin, statin and good BP control .

## 2016-08-19 NOTE — Assessment & Plan Note (Signed)
We discussed this at length today.  She was unaware of any kidney problems.  I advised her that the best treatment of this was to treat her BP, which is well controlled today.   Check BMET.

## 2016-08-19 NOTE — Patient Instructions (Addendum)
Ms. Broz - -   For your foot pain at your calluses, please go to see Dr. Milinda Pointer on May 8.    For your pain, please keep using the voltaren gel.  Try a higher dose of the tramadol to see if this helps.  You can take 1-2 tablets of the tramadol twice day.  Do not take more than prescribed.   If you develop drowsiness on the medication, please stop it.   More information below.  Please come back to see me in about 3 months, sooner if you need to.   Tramadol tablets What is this medicine? TRAMADOL (TRA ma dole) is a pain reliever. It is used to treat moderate to severe pain in adults. This medicine may be used for other purposes; ask your health care provider or pharmacist if you have questions. COMMON BRAND NAME(S): Ultram What should I tell my health care provider before I take this medicine? They need to know if you have any of these conditions: -brain tumor -depression -drug abuse or addiction -head injury -if you frequently drink alcohol containing drinks -kidney disease or trouble passing urine -liver disease -lung disease, asthma, or breathing problems -seizures or epilepsy -suicidal thoughts, plans, or attempt; a previous suicide attempt by you or a family member -an unusual or allergic reaction to tramadol, codeine, other medicines, foods, dyes, or preservatives -pregnant or trying to get pregnant -breast-feeding How should I use this medicine? Take this medicine by mouth with a full glass of water. Follow the directions on the prescription label. You can take it with or without food. If it upsets your stomach, take it with food. Do not take your medicine more often than directed. A special MedGuide will be given to you by the pharmacist with each prescription and refill. Be sure to read this information carefully each time. Talk to your pediatrician regarding the use of this medicine in children. Special care may be needed. Overdosage: If you think you have taken too much of  this medicine contact a poison control center or emergency room at once. NOTE: This medicine is only for you. Do not share this medicine with others. What if I miss a dose? If you miss a dose, take it as soon as you can. If it is almost time for your next dose, take only that dose. Do not take double or extra doses. What may interact with this medicine? Do not take this medication with any of the following medicines: -MAOIs like Carbex, Eldepryl, Marplan, Nardil, and Parnate This medicine may also interact with the following medications: -alcohol -antihistamines for allergy, cough and cold -certain medicines for anxiety or sleep -certain medicines for depression like amitriptyline, fluoxetine, sertraline -certain medicines for migraine headache like almotriptan, eletriptan, frovatriptan, naratriptan, rizatriptan, sumatriptan, zolmitriptan -certain medicines for seizures like carbamazepine, oxcarbazepine, phenobarbital, primidone -certain medicines that treat or prevent blood clots like warfarin -digoxin -furazolidone -general anesthetics like halothane, isoflurane, methoxyflurane, propofol -linezolid -local anesthetics like lidocaine, pramoxine, tetracaine -medicines that relax muscles for surgery -other narcotic medicines for pain or cough -phenothiazines like chlorpromazine, mesoridazine, prochlorperazine, thioridazine -procarbazine This list may not describe all possible interactions. Give your health care provider a list of all the medicines, herbs, non-prescription drugs, or dietary supplements you use. Also tell them if you smoke, drink alcohol, or use illegal drugs. Some items may interact with your medicine. What should I watch for while using this medicine? Tell your doctor or health care professional if your pain does not go away,  if it gets worse, or if you have new or a different type of pain. You may develop tolerance to the medicine. Tolerance means that you will need a higher  dose of the medicine for pain relief. Tolerance is normal and is expected if you take this medicine for a long time. Do not suddenly stop taking your medicine because you may develop a severe reaction. Your body becomes used to the medicine. This does NOT mean you are addicted. Addiction is a behavior related to getting and using a drug for a non-medical reason. If you have pain, you have a medical reason to take pain medicine. Your doctor will tell you how much medicine to take. If your doctor wants you to stop the medicine, the dose will be slowly lowered over time to avoid any side effects. There are different types of narcotic medicines (opiates). If you take more than one type at the same time or if you are taking another medicine that also causes drowsiness, you may have more side effects. Give your health care provider a list of all medicines you use. Your doctor will tell you how much medicine to take. Do not take more medicine than directed. Call emergency for help if you have problems breathing or unusual sleepiness. You may get drowsy or dizzy. Do not drive, use machinery, or do anything that needs mental alertness until you know how this medicine affects you. Do not stand or sit up quickly, especially if you are an older patient. This reduces the risk of dizzy or fainting spells. Alcohol can increase or decrease the effects of this medicine. Avoid alcoholic drinks. You may have constipation. Try to have a bowel movement at least every 2 to 3 days. If you do not have a bowel movement for 3 days, call your doctor or health care professional. Your mouth may get dry. Chewing sugarless gum or sucking hard candy, and drinking plenty of water may help. Contact your doctor if the problem does not go away or is severe. What side effects may I notice from receiving this medicine? Side effects that you should report to your doctor or health care professional as soon as possible: -allergic reactions like  skin rash, itching or hives, swelling of the face, lips, or tongue -breathing problems -confusion -seizures -signs and symptoms of low blood pressure like dizziness; feeling faint or lightheaded, falls; unusually weak or tired -trouble passing urine or change in the amount of urine Side effects that usually do not require medical attention (report to your doctor or health care professional if they continue or are bothersome): -constipation -dry mouth -nausea, vomiting -tiredness This list may not describe all possible side effects. Call your doctor for medical advice about side effects. You may report side effects to FDA at 1-800-FDA-1088. Where should I keep my medicine? Keep out of the reach of children. This medicine may cause accidental overdose and death if it taken by other adults, children, or pets. Mix any unused medicine with a substance like cat litter or coffee grounds. Then throw the medicine away in a sealed container like a sealed bag or a coffee can with a lid. Do not use the medicine after the expiration date. Store at room temperature between 15 and 30 degrees C (59 and 86 degrees F). NOTE: This sheet is a summary. It may not cover all possible information. If you have questions about this medicine, talk to your doctor, pharmacist, or health care provider.  2018 [employer]vier/Gold Standard (2015-01-21 09:00:04)

## 2016-08-19 NOTE — Assessment & Plan Note (Signed)
Seems to be improved.  Reports her main symptom is now foot pain/callus pain.    Continue aspirin, plavix, statin

## 2016-08-20 LAB — CBC
Hematocrit: 43.7 % (ref 34.0–46.6)
Hemoglobin: 14.3 g/dL (ref 11.1–15.9)
MCH: 31.3 pg (ref 26.6–33.0)
MCHC: 32.7 g/dL (ref 31.5–35.7)
MCV: 96 fL (ref 79–97)
PLATELETS: 229 10*3/uL (ref 150–379)
RBC: 4.57 x10E6/uL (ref 3.77–5.28)
RDW: 14.2 % (ref 12.3–15.4)
WBC: 6.9 10*3/uL (ref 3.4–10.8)

## 2016-08-20 LAB — LIPID PANEL
CHOL/HDL RATIO: 2.2 ratio (ref 0.0–4.4)
Cholesterol, Total: 122 mg/dL (ref 100–199)
HDL: 55 mg/dL (ref >39)
LDL CALC: 52 mg/dL (ref 0–99)
Triglycerides: 73 mg/dL (ref 0–149)
VLDL CHOLESTEROL CAL: 15 mg/dL (ref 5–40)

## 2016-08-20 LAB — BMP8+ANION GAP
Anion Gap: 18 mmol/L (ref 10.0–18.0)
BUN/Creatinine Ratio: 17 (ref 12–28)
BUN: 25 mg/dL (ref 8–27)
CHLORIDE: 99 mmol/L (ref 96–106)
CO2: 25 mmol/L (ref 18–29)
CREATININE: 1.49 mg/dL — AB (ref 0.57–1.00)
Calcium: 10.4 mg/dL — ABNORMAL HIGH (ref 8.7–10.3)
GFR, EST AFRICAN AMERICAN: 39 mL/min/{1.73_m2} — AB (ref >59)
GFR, EST NON AFRICAN AMERICAN: 34 mL/min/{1.73_m2} — AB (ref >59)
GLUCOSE: 101 mg/dL — AB (ref 65–99)
Potassium: 4.6 mmol/L (ref 3.5–5.2)
Sodium: 142 mmol/L (ref 134–144)

## 2016-08-20 LAB — VITAMIN D 25 HYDROXY (VIT D DEFICIENCY, FRACTURES): Vit D, 25-Hydroxy: 18.1 ng/mL — ABNORMAL LOW (ref 30.0–100.0)

## 2016-09-02 ENCOUNTER — Telehealth: Payer: Self-pay | Admitting: Internal Medicine

## 2016-09-02 MED ORDER — VITAMIN D (CHOLECALCIFEROL) 25 MCG (1000 UT) PO CAPS: 1000.0000 [IU] | ORAL_CAPSULE | Freq: Every day | ORAL | 3 refills | Status: DC

## 2016-09-02 NOTE — Telephone Encounter (Signed)
Called [name] to discuss her vitamin D level.  She has moderate deficiency at 18.1.  She can take a daily supplement and we will recheck.  She continues to have painful calluses.  She is due to see Dr. Milinda Pointer in podiatry in May.   Plan  Vitamin D 1000 IU daily, recheck Vitamin D at next visit.  Vitamin D deficiency can cause muscle cramping and leg pain, possibly this would help her pain somewhat.

## 2016-09-10 ENCOUNTER — Telehealth: Payer: Self-pay | Admitting: Cardiovascular Disease

## 2016-09-10 NOTE — Telephone Encounter (Signed)
Spoke with pt, she recently saw her medical doctor and was told she had to take vit d because something was wrong with her blood. Able to see office note from PCP, patient was started on vit d because her level was low. Explained her blood count was normal. Explained the difference between blood thinner and plavix. Explained she would need to stay on the plavix until she sees dr berry back. Pt agreed with this plan.

## 2016-09-10 NOTE — Telephone Encounter (Signed)
Patient calling, states that she believes her "blood is too thin" and would like to know if she should discontinue her blood-thinner medication. Please call, thanks.

## 2016-09-16 ENCOUNTER — Ambulatory Visit (INDEPENDENT_AMBULATORY_CARE_PROVIDER_SITE_OTHER): Payer: Medicare Other | Admitting: Podiatry

## 2016-09-16 ENCOUNTER — Encounter: Payer: Self-pay | Admitting: Podiatry

## 2016-09-16 DIAGNOSIS — Q828 Other specified congenital malformations of skin: Secondary | ICD-10-CM

## 2016-09-16 DIAGNOSIS — M79676 Pain in unspecified toe(s): Secondary | ICD-10-CM

## 2016-09-16 DIAGNOSIS — B351 Tinea unguium: Secondary | ICD-10-CM | POA: Diagnosis not present

## 2016-09-16 NOTE — Progress Notes (Signed)
She presents today to complaint of painful elongated toenails and corns and calluses.  Objective: Porokeratosis bilateral. Pulses are palpable. No open lesions or wounds are noted. No toenails are long thick yellow dystrophic with mycotic and painful.  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  Plan: Debridement of toenails bilateral. Debridement of nails 1 through 5 and porokeratosis bilateral.

## 2016-09-21 ENCOUNTER — Emergency Department (HOSPITAL_COMMUNITY)
Admission: EM | Admit: 2016-09-21 | Discharge: 2016-09-22 | Disposition: A | Discharge status: Home / Self Care | Payer: Medicare Other | Attending: Emergency Medicine | Admitting: Emergency Medicine

## 2016-09-21 ENCOUNTER — Encounter (HOSPITAL_COMMUNITY): Payer: Self-pay | Admitting: Emergency Medicine

## 2016-09-21 DIAGNOSIS — J449 Chronic obstructive pulmonary disease, unspecified: Secondary | ICD-10-CM | POA: Diagnosis not present

## 2016-09-21 DIAGNOSIS — Z79899 Other long term (current) drug therapy: Secondary | ICD-10-CM | POA: Diagnosis not present

## 2016-09-21 DIAGNOSIS — R110 Nausea: Secondary | ICD-10-CM | POA: Diagnosis not present

## 2016-09-21 DIAGNOSIS — R109 Unspecified abdominal pain: Secondary | ICD-10-CM | POA: Diagnosis not present

## 2016-09-21 DIAGNOSIS — I129 Hypertensive chronic kidney disease with stage 1 through stage 4 chronic kidney disease, or unspecified chronic kidney disease: Secondary | ICD-10-CM | POA: Diagnosis not present

## 2016-09-21 DIAGNOSIS — Z7982 Long term (current) use of aspirin: Secondary | ICD-10-CM | POA: Diagnosis not present

## 2016-09-21 DIAGNOSIS — F17210 Nicotine dependence, cigarettes, uncomplicated: Secondary | ICD-10-CM | POA: Insufficient documentation

## 2016-09-21 DIAGNOSIS — N3000 Acute cystitis without hematuria: Secondary | ICD-10-CM | POA: Diagnosis not present

## 2016-09-21 DIAGNOSIS — R100 Acute abdomen: Secondary | ICD-10-CM | POA: Diagnosis not present

## 2016-09-21 DIAGNOSIS — N183 Chronic kidney disease, stage 3 (moderate): Secondary | ICD-10-CM | POA: Diagnosis not present

## 2016-09-21 DIAGNOSIS — K5900 Constipation, unspecified: Secondary | ICD-10-CM | POA: Diagnosis not present

## 2016-09-21 DIAGNOSIS — R1084 Generalized abdominal pain: Secondary | ICD-10-CM | POA: Diagnosis not present

## 2016-09-21 NOTE — ED Notes (Signed)
Bed: WA24 Expected date:  Expected time:  Means of arrival:  Comments: 76 yo F/abd pain-constipation

## 2016-09-21 NOTE — ED Triage Notes (Signed)
Brought in by [facility] from home with chief complaint of abdominal pain without nausea, vomiting or diarrhea.  Pt also c/o "constipation"--- reports that her LBM was 3 days ago.  Pt did not try any remedies for her constipation.

## 2016-09-22 ENCOUNTER — Emergency Department (HOSPITAL_COMMUNITY): Payer: Medicare Other

## 2016-09-22 DIAGNOSIS — R109 Unspecified abdominal pain: Secondary | ICD-10-CM | POA: Diagnosis not present

## 2016-09-22 DIAGNOSIS — N3000 Acute cystitis without hematuria: Secondary | ICD-10-CM | POA: Diagnosis not present

## 2016-09-22 LAB — COMPREHENSIVE METABOLIC PANEL
ALBUMIN: 3.6 g/dL (ref 3.5–5.0)
ALT: 8 U/L — AB (ref 14–54)
AST: 13 U/L — AB (ref 15–41)
Alkaline Phosphatase: 79 U/L (ref 38–126)
Anion gap: 9 (ref 5–15)
BILIRUBIN TOTAL: 0.9 mg/dL (ref 0.3–1.2)
BUN: 26 mg/dL — AB (ref 6–20)
CHLORIDE: 102 mmol/L (ref 101–111)
CO2: 26 mmol/L (ref 22–32)
CREATININE: 1.98 mg/dL — AB (ref 0.44–1.00)
Calcium: 9.1 mg/dL (ref 8.9–10.3)
GFR calc Af Amer: 27 mL/min — ABNORMAL LOW (ref >60)
GFR, EST NON AFRICAN AMERICAN: 23 mL/min — AB (ref >60)
GLUCOSE: 109 mg/dL — AB (ref 65–99)
POTASSIUM: 3.8 mmol/L (ref 3.5–5.1)
Sodium: 137 mmol/L (ref 135–145)
Total Protein: 6.6 g/dL (ref 6.5–8.1)

## 2016-09-22 LAB — CBC WITH DIFFERENTIAL/PLATELET
BASOS ABS: 0.1 10*3/uL (ref 0.0–0.1)
Basophils Relative: 1 %
EOS PCT: 2 %
Eosinophils Absolute: 0.2 10*3/uL (ref 0.0–0.7)
HCT: 43.8 % (ref 36.0–46.0)
Hemoglobin: 14.3 g/dL (ref 12.0–15.0)
LYMPHS PCT: 31 %
Lymphs Abs: 2.1 10*3/uL (ref 0.7–4.0)
MCH: 31 pg (ref 26.0–34.0)
MCHC: 32.6 g/dL (ref 30.0–36.0)
MCV: 94.8 fL (ref 78.0–100.0)
MONO ABS: 0.5 10*3/uL (ref 0.1–1.0)
MONOS PCT: 8 %
NEUTROS ABS: 3.8 10*3/uL (ref 1.7–7.7)
Neutrophils Relative %: 58 %
PLATELETS: 193 10*3/uL (ref 150–400)
RBC: 4.62 MIL/uL (ref 3.87–5.11)
RDW: 14.9 % (ref 11.5–15.5)
WBC: 6.6 10*3/uL (ref 4.0–10.5)

## 2016-09-22 LAB — URINALYSIS, ROUTINE W REFLEX MICROSCOPIC
Bilirubin Urine: NEGATIVE
Glucose, UA: NEGATIVE mg/dL
KETONES UR: NEGATIVE mg/dL
Nitrite: NEGATIVE
PH: 5 (ref 5.0–8.0)
Protein, ur: 30 mg/dL — AB
SPECIFIC GRAVITY, URINE: 1.016 (ref 1.005–1.030)

## 2016-09-22 LAB — LIPASE, BLOOD: Lipase: 17 U/L (ref 11–51)

## 2016-09-22 MED ORDER — IOPAMIDOL (ISOVUE-300) INJECTION 61%
30.0000 mL | Freq: Once | INTRAVENOUS | Status: AC | PRN
  Administered 2016-09-22: 30 mL via ORAL

## 2016-09-22 MED ORDER — CEPHALEXIN 500 MG PO CAPS
500.0000 mg | ORAL_CAPSULE | Freq: Once | ORAL | Status: AC
  Administered 2016-09-22: 500 mg via ORAL
  Filled 2016-09-22: qty 1

## 2016-09-22 MED ORDER — ONDANSETRON HCL 4 MG/2ML IJ SOLN
4.0000 mg | Freq: Once | INTRAMUSCULAR | Status: AC
  Administered 2016-09-22: 4 mg via INTRAVENOUS
  Filled 2016-09-22: qty 2

## 2016-09-22 MED ORDER — FENTANYL CITRATE (PF) 100 MCG/2ML IJ SOLN
50.0000 ug | INTRAMUSCULAR | Status: DC | PRN
  Administered 2016-09-22: 50 ug via INTRAVENOUS
  Filled 2016-09-22: qty 2

## 2016-09-22 MED ORDER — ONDANSETRON 8 MG PO TBDP: 8.0000 mg | ORAL_TABLET | Freq: Three times a day (TID) | ORAL | 0 refills | Status: DC | PRN

## 2016-09-22 MED ORDER — IOPAMIDOL (ISOVUE-300) INJECTION 61%
INTRAVENOUS | Status: AC
  Administered 2016-09-22: 30 mL via ORAL
  Filled 2016-09-22: qty 30

## 2016-09-22 MED ORDER — CEPHALEXIN 500 MG PO CAPS: 500.0000 mg | ORAL_CAPSULE | Freq: Three times a day (TID) | ORAL | 0 refills | Status: DC

## 2016-09-22 MED ORDER — POLYETHYLENE GLYCOL 3350 17 GM/SCOOP PO POWD: 17.0000 g | Freq: Every day | ORAL | 0 refills | Status: DC

## 2016-09-22 NOTE — ED Provider Notes (Signed)
[facility] DEPT Provider Note   CSN: [id number] Arrival date & time: 09/21/16  2247     History   Chief Complaint Chief Complaint  Patient presents with  . Abdominal Pain    HPI [name] is a 76 y.o. female.  HPI  Patient presents with one-week history of generalized abdominal pain and no bowel movement for the past 4 days. She states that she experienced this similar type of abdominal pain when she was diagnosed with an obstruction several months ago for which she had to have an NG tube put in. She is afraid that this is what causing her pain today again. She states that she usually takes Dulcolax for her constipation which she took about 3 days ago. She states that this usually works for her but she has still not had a bowel movement. Denies any blood in stool. Patient states that she has been eating and drinking normally. Denies any nausea, vomiting, fever, urinary symptoms, chest pain, trouble breathing, back pain, trouble walking.  Past Medical History:  Diagnosis Date  . Arthritis    "left shoulder" (04/07/2016)  . Blind    s/p bilateral corneal transplant; "legally blind in both eyes" (04/07/2016)  . CKD (chronic kidney disease), stage III   . COPD (chronic obstructive pulmonary disease) ([place])   . DVT (deep venous thrombosis) ([place]) "years ago"   "right thigh"    . HEARING LOSS   . Hyperlipidemia   . Hypertension   . PAD (peripheral artery disease) ([place])    a. 03/2016 Periph Angio: L SFA 99p, 50mw/ one vessel runoff. R SFA 1052m/ one vessel runofff;  b. 04/07/2016 PTA L SFA ([place] 1 LS atherecromty w/ DEBA). c. 04/24/16 s/p PTA to R SFA, right tibioperoneal trunk and posterior tibial artery.  . Phlebitis   . Tobacco abuse     Patient Active Problem List   Diagnosis Date Noted  . 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 Claudication in peripheral vascular disease ([place]/14/2017  . PVD (peripheral vascular disease) s/p LLE PTA 03/2016, RLE PTA 04/2016    . Bilateral foot pain 02/20/2016  . Epidermoid cyst 11/01/2015  . Vitamin D deficiency 03/29/2014  . COPD (chronic obstructive pulmonary disease) ([place]/13/2015  . Postmenopausal 06/16/2013  . Preventative health care 06/16/2013  . Bilateral shoulder pain 09/01/2012  . Bilateral lower extremity edema 11/19/2011  . CKD (chronic kidney disease) stage 3, GFR 30-59 ml/min 11/28/2009  . Hyperlipidemia 07/02/2006  . DEAFNESS 05/13/2006  . TOBACCO ABUSE 03/26/2006  . BLINDNESS 03/26/2006  . Essential hypertension 03/26/2006    Past Surgical History:  Procedure Laterality Date  . CARPAL TUNNEL RELEASE Right 04/2003   /nArchie Endo/16/2012  . EYE SURGERY Bilateral    s/p bilateral corneal transplant  . FRACTURE SURGERY    . HAMMER TOE SURGERY Left 01/2010   /nArchie Endo/16/2011  . [place]"  . PERIPHERAL VASCULAR CATHETERIZATION N/A 03/17/2016   Procedure: Lower Extremity Intervention;  Surgeon: JoLorretta HarpMD;  [facility];  Service: Cardiovascular;  Laterality: N/A;  . PERIPHERAL VASCULAR CATHETERIZATION Bilateral 04/07/2016   Procedure: Lower Extremity Angiography;  Surgeon: JoLorretta HarpMD;  [facility];  Service: Cardiovascular;  Laterality: Bilateral;  . PERIPHERAL VASCULAR CATHETERIZATION Left 04/07/2016   Procedure: Peripheral Vascular Atherectomy;  Surgeon: JoLorretta HarpMD;  [facility];  Service: Cardiovascular;  Laterality: Left;  SFA  . PERIPHERAL VASCULAR CATHETERIZATION  04/24/2016  . PERIPHERAL VASCULAR CATHETERIZATION Right 04/24/2016   Procedure: Peripheral Vascular Atherectomy;  Surgeon: Lorretta Harp, MD;  Location: [facility] LAB;  Service: Cardiovascular;  Laterality: Right;  superficial femoral  . PERIPHERAL VASCULAR CATHETERIZATION Right 04/24/2016   Procedure: Peripheral Vascular Balloon Angioplasty;  Surgeon: Lorretta Harp, MD;  Location: [facility]  CV LAB;  Service: Cardiovascular;  Laterality: Right;  Tibeoperoneal trunk and posterior tibial  . SHOULDER ARTHROSCOPY W/ ROTATOR CUFF REPAIR Right 09/2002   Archie Endo 09/25/2010  . SHOULDER OPEN ROTATOR CUFF REPAIR Right 1973   secondary numbness in R hand  . SHOULDER OPEN ROTATOR CUFF REPAIR Left 06/2003   Archie Endo 09/25/2010  . WRIST FRACTURE SURGERY Right     OB History    No data available       Home Medications    Prior to Admission medications   Medication Sig Start Date End Date Taking? Authorizing Provider  acetaminophen (TYLENOL) 500 MG tablet Take 500-1,000 mg by mouth every 6 (six) hours as needed (for pain/foot pain.).     [provider]  albuterol (PROVENTIL HFA;VENTOLIN HFA) 108 (90 Base) MCG/ACT inhaler Inhale 2 puffs into the lungs every 6 (six) hours as needed for wheezing or shortness of breath. 08/15/15   Sid Falcon, MD  aspirin EC 81 MG EC tablet Take 1 tablet (81 mg total) by mouth daily. 03/19/16   Arbutus Leas, NP  atorvastatin (LIPITOR) 40 MG tablet Take 1 tablet (40 mg total) by mouth daily. 04/16/16 07/15/16  Erlene Quan, PA-C  clopidogrel (PLAVIX) 75 MG tablet Take 1 tablet (75 mg total) by mouth daily with breakfast. 03/19/16   Arbutus Leas, NP  diclofenac sodium (VOLTAREN) 1 % GEL Apply 4 g topically 4 (four) times daily. 07/25/16   Dellia Nims, MD  hydrochlorothiazide (MICROZIDE) 12.5 MG capsule Take 1 capsule (12.5 mg total) by mouth daily. 04/30/16   Sid Falcon, MD  nicotine (NICODERM CQ - DOSED IN MG/24 HOURS) 14 mg/24hr patch Place 1 patch (14 mg total) onto the skin daily. 07/25/16 07/25/17  Dellia Nims, MD  nicotine (NICODERM CQ - DOSED IN MG/24 HOURS) 21 mg/24hr patch Place 1 patch (21 mg total) onto the skin daily. 07/25/16 07/25/17  Dellia Nims, MD  nicotine (NICODERM CQ - DOSED IN MG/24 HR) 7 mg/24hr patch Place 1 patch (7 mg total) onto the skin daily. 07/25/16 07/25/17  Dellia Nims, MD  nicotine polacrilex (NICORETTE) 2 MG gum Take  1 each (2 mg total) by mouth as needed for smoking cessation. 07/25/16   Dellia Nims, MD  prednisoLONE acetate (PRED FORTE) 1 % ophthalmic suspension Place 1 drop into both eyes daily.    [provider]  traMADol (ULTRAM) 50 MG tablet Take 1-2 tablets (50-100 mg total) by mouth every 12 (twelve) hours as needed. 08/19/16   Sid Falcon, MD  Vitamin D, Cholecalciferol, 1000 units CAPS Take 1,000 Units by mouth daily. 09/02/16   Sid Falcon, MD    Family History Family History  Problem Relation Age of Onset  . Cancer Maternal Grandmother        liver  . Cancer Maternal Uncle        colon  . Cancer Mother        liver  . Cancer Brother        Unknown  . Cancer Sister        Unknown  Social History Social History  Substance Use Topics  . Smoking status: Current Every Day Smoker    Packs/day: 0.50    Years: 35.00    Types: Cigarettes  . Smokeless tobacco: Never Used     Comment: Sometimes less.  . Alcohol use No     Allergies   Codeine and Morphine   Review of Systems Review of Systems  Constitutional: Negative for appetite change, chills and fever.  HENT: Negative for ear pain, rhinorrhea, sneezing and sore throat.   Eyes: Negative for photophobia and visual disturbance.  Respiratory: Negative for cough, chest tightness, shortness of breath and wheezing.   Cardiovascular: Negative for chest pain and palpitations.  Gastrointestinal: Positive for abdominal pain and constipation. Negative for blood in stool, diarrhea, nausea and vomiting.  Genitourinary: Negative for dysuria, hematuria and urgency.  Musculoskeletal: Negative for myalgias.  Skin: Negative for rash.  Neurological: Negative for dizziness, weakness and light-headedness.     Physical Exam Updated Vital Signs BP 108/67   Pulse 76   Temp 98 F (36.7 C) (Oral)   Resp 18   Ht '5\' 4"'$  (1.626 m)   Wt 65.3 kg   LMP  (LMP Unknown)   SpO2 98%   BMI 24.72 kg/m   Physical Exam    Constitutional: She appears well-developed and well-nourished. No distress.  HENT:  Head: Normocephalic and atraumatic.  Nose: Nose normal.  Eyes: Conjunctivae and EOM are normal. Right eye exhibits no discharge. Left eye exhibits no discharge. No scleral icterus.  Neck: Normal range of motion. Neck supple.  Cardiovascular: Normal rate, regular rhythm, normal heart sounds and intact distal pulses.  Exam reveals no gallop and no friction rub.   No murmur heard. Pulmonary/Chest: Effort normal and breath sounds normal. No respiratory distress.  Abdominal: Soft. Bowel sounds are normal. She exhibits no distension. There is tenderness (Generalized). There is no rebound and no guarding.  Musculoskeletal: Normal range of motion. She exhibits no edema.  Neurological: She is alert. She exhibits normal muscle tone. Coordination normal.  Skin: Skin is warm and dry. No rash noted.  Psychiatric: She has a normal mood and affect.  Nursing note and vitals reviewed.    ED Treatments / Results  Labs (all labs ordered are listed, but only abnormal results are displayed) Labs Reviewed  CBC WITH DIFFERENTIAL/PLATELET  COMPREHENSIVE METABOLIC PANEL  LIPASE, BLOOD    EKG  EKG Interpretation None       Radiology No results found.  Procedures Procedures (including critical care time)  Medications Ordered in ED Medications - No data to display   Initial Impression / Assessment and Plan / ED Course  I have reviewed the triage vital signs and the nursing notes.  Pertinent labs & imaging results that were available during my care of the patient were reviewed by me and considered in my medical decision making (see chart for details).     Patient's history and symptoms concerning for constipation versus bowel obstruction. Patient is afebrile at this time with no history of fever. Patient does not appear toxic and is not in any acute distress. There is tenderness to palpation around the  abdomen. No focal area of tenderness noted. Patient has a history of stage III kidney disease. Creatinine at today's visit was 1.98 which is elevated from her baseline of ~1.3-1.4. BUN elevated at 26. Lipase and CBC unremarkable at this time. We'll need to evaluate for both constipation and obstruction today, as well as other acute intra-abdominal processes. CT  abdomen and pelvis without contrast due to kidney disease was ordered. Patient discussed with Dr. Venora Maples who will resume care. CT pending at end of shift.   Final Clinical Impressions(s) / ED Diagnoses   Final diagnoses:  None    New Prescriptions New Prescriptions   No medications on file     Delia Heady, Hershal Coria 09/22/16 [place], Kevin, MD 09/22/16 [phone]

## 2016-09-22 NOTE — ED Notes (Signed)
Pt went to the bathroom and did not provide [place] with a specimen

## 2016-09-24 LAB — URINE CULTURE: Culture: >=100000 — AB

## 2016-09-25 ENCOUNTER — Telehealth: Payer: Self-pay | Admitting: *Deleted

## 2016-09-25 NOTE — Progress Notes (Signed)
ED Antimicrobial Stewardship Positive Culture Follow Up   [name] is an 76 y.o. female who presented to [facility] on 09/21/2016 with a chief complaint of  Chief Complaint  Patient presents with  . Abdominal Pain    Recent Results (from the past 720 hour(s))  Urine culture     Status: Abnormal   Collection Time: 09/22/16  2:20 AM  Result Value Ref Range Status   Specimen Description URINE, RANDOM  Final   Special Requests NONE  Final   Culture >=100,000 COLONIES/mL CITROBACTER FREUNDII (A)  Final   Report Status 09/24/2016 FINAL  Final   Organism ID, Bacteria CITROBACTER FREUNDII (A)  Final      Susceptibility   Citrobacter freundii - MIC*    CEFAZOLIN >=64 RESISTANT Resistant     CEFTRIAXONE <=1 SENSITIVE Sensitive     CIPROFLOXACIN <=0.25 SENSITIVE Sensitive     GENTAMICIN <=1 SENSITIVE Sensitive     IMIPENEM 0.5 SENSITIVE Sensitive     NITROFURANTOIN <=16 SENSITIVE Sensitive     TRIMETH/SULFA <=20 SENSITIVE Sensitive     PIP/TAZO <=4 SENSITIVE Sensitive     * >=100,000 COLONIES/mL CITROBACTER FREUNDII    '[x]'$  Treated with Cephalexin, organism resistant to prescribed antimicrobial.   Plan: Discontinue Cephalexin. Call patient and follow-up to see if she has any urinary symptoms (dysuria, frequency, urgency, flank pain). If she does have urinary symptoms: Initiate Levaquin 250 mg PO Daily x 3 days.   ED Provider: Eliezer Mccoy, PA-C  Geraldo Pitter 09/25/2016, 10:29 AM Student Pharmacist

## 2016-09-25 NOTE — Telephone Encounter (Signed)
Post ED Visit - Positive Culture Follow-up: Unsuccessful Patient Follow-up  Culture assessed and recommendations reviewed by:  '[]'$  Elenor Quinones, Pharm.D. '[]'$  [name], Pharm.D., BCPS AQ-ID '[]'$  Parks Neptune, Pharm.D., BCPS '[]'$  [name], Pharm.D., BCPS '[]'$  H[place]erson, Pharm.D., BCPS, AAHIVP '[]'$  [name], Pharm.D., BCPS, AAHIVP '[]'$  Salome Arnt, PharmD, BCPS '[]'$  Dimitri Ped, PharmD, BCPS '[]'$  Vincenza Hews, PharmD, BCPS  Positive urine culture  '[]'$  Patient discharged without antimicrobial prescription and treatment is now indicated '[x]'$  Organism is resistant to prescribed ED discharge antimicrobial '[]'$  Patient with positive blood cultures   Unable to contact patient after 3 attempts, letter will be sent to address on file  Ardeen Fillers 09/25/2016, 10:48 AM

## 2016-10-08 ENCOUNTER — Ambulatory Visit: Payer: Medicare Other | Admitting: Cardiovascular Disease

## 2016-10-17 ENCOUNTER — Ambulatory Visit: Payer: Medicare Other | Admitting: Cardiovascular Disease

## 2016-10-21 ENCOUNTER — Ambulatory Visit (INDEPENDENT_AMBULATORY_CARE_PROVIDER_SITE_OTHER): Payer: Medicare Other | Admitting: Cardiovascular Disease

## 2016-10-21 ENCOUNTER — Encounter: Payer: Self-pay | Admitting: Cardiovascular Disease

## 2016-10-21 VITALS — BP 112/68 | HR 69 | Ht 66.0 in | Wt 145.0 lb

## 2016-10-21 DIAGNOSIS — I10 Essential (primary) hypertension: Secondary | ICD-10-CM

## 2016-10-21 DIAGNOSIS — E7800 Pure hypercholesterolemia, unspecified: Secondary | ICD-10-CM

## 2016-10-21 DIAGNOSIS — F17200 Nicotine dependence, unspecified, uncomplicated: Secondary | ICD-10-CM

## 2016-10-21 DIAGNOSIS — I739 Peripheral vascular disease, unspecified: Secondary | ICD-10-CM

## 2016-10-21 DIAGNOSIS — I70229 Atherosclerosis of native arteries of extremities with rest pain, unspecified extremity: Secondary | ICD-10-CM

## 2016-10-21 DIAGNOSIS — I998 Other disorder of circulatory system: Secondary | ICD-10-CM | POA: Diagnosis not present

## 2016-10-21 NOTE — Assessment & Plan Note (Signed)
Status post staged right and left SFA intervention in November and December of last year. Dopplers performed 06/04/16 reveal essentially normal ABIs with normal velocities. She remains on Plavix. We'll continue to follow her noninvasively on annual basis.

## 2016-10-21 NOTE — Progress Notes (Signed)
10/21/2016 [name]   August 20, 1940  [id number]  Primary Physician Sid Falcon, MD Primary Cardiologist: Lorretta Harp MD Renae Gloss  HPI:  .[name] was referred to me by Dr. Milinda Pointer, her podiatrist, for peripheral vascular evaluation and preoperative clearance. I last saw her in the office 02/29/16 She has a history of treated hypertension and long-standing tobacco abuse. She is not diabetic. She apparently has lipidemia but not on statin therapy. She has never had a heart attack or stroke. She's had prior foot surgeries in the past and has bilateral foot pain right greater than left making it difficult to walk. She saw her podiatrist wants to perform a surgical procedure and she was referred here for vascular clearance. She underwent diagnostic peripheral angiography on 03/17/16 revealing high-grade disease in the proximal and distal left SFA with one-vessel runoff and an occluded right SFA. Because of her renal insufficiency it was decided to stage her left and right SFA intervention. She underwent left SFA intervention with excellent angiographic and Doppler result. She underwent staged right SFA intervention soon thereafter. Her follow-up Dopplers performed 06/04/16 revealed normal ABIs with normal velocities. She does continue to smoke unfortunately.   Current Outpatient Prescriptions  Medication Sig Dispense Refill  . albuterol (PROVENTIL HFA;VENTOLIN HFA) 108 (90 Base) MCG/ACT inhaler Inhale 2 puffs into the lungs every 6 (six) hours as needed for wheezing or shortness of breath. 1 Inhaler 6  . aspirin EC 81 MG EC tablet Take 1 tablet (81 mg total) by mouth daily.    Marland Kitchen atorvastatin (LIPITOR) 40 MG tablet Take 40 mg by mouth daily.    . cephALEXin (KEFLEX) 500 MG capsule Take 1 capsule (500 mg total) by mouth 3 (three) times daily. 21 capsule 0  . clopidogrel (PLAVIX) 75 MG tablet Take 1 tablet (75 mg total) by mouth daily with breakfast. 30 tablet 12  .  diclofenac sodium (VOLTAREN) 1 % GEL Apply 4 g topically 4 (four) times daily. (Patient taking differently: Apply 4 g topically 4 (four) times daily as needed (PAIN). ) 100 g 2  . hydrochlorothiazide (MICROZIDE) 12.5 MG capsule Take 1 capsule (12.5 mg total) by mouth daily. 30 capsule 6  . nicotine (NICODERM CQ - DOSED IN MG/24 HOURS) 14 mg/24hr patch Place 1 patch (14 mg total) onto the skin daily. 14 patch 0  . nicotine (NICODERM CQ - DOSED IN MG/24 HOURS) 21 mg/24hr patch Place 1 patch (21 mg total) onto the skin daily. 30 patch 2  . nicotine (NICODERM CQ - DOSED IN MG/24 HR) 7 mg/24hr patch Place 1 patch (7 mg total) onto the skin daily. 14 patch 0  . nicotine polacrilex (NICORETTE) 2 MG gum Take 1 each (2 mg total) by mouth as needed for smoking cessation. 100 tablet 0  . ondansetron (ZOFRAN ODT) 8 MG disintegrating tablet Take 1 tablet (8 mg total) by mouth every 8 (eight) hours as needed for nausea or vomiting. 10 tablet 0  . polyethylene glycol powder (MIRALAX) powder Take 17 g by mouth daily. 255 g 0  . prednisoLONE acetate (PRED FORTE) 1 % ophthalmic suspension Place 1 drop into both eyes daily.    . traMADol (ULTRAM) 50 MG tablet Take 1-2 tablets (50-100 mg total) by mouth every 12 (twelve) hours as needed. 30 tablet 0  . Vitamin D, Cholecalciferol, 1000 units CAPS Take 1,000 Units by mouth daily. 30 capsule 3   No current facility-administered medications for this visit.  Allergies  Allergen Reactions  . Codeine Other (See Comments)    REACTION: Pt c/o "feeling high"  . Morphine Other (See Comments)    REACTION: feels funny and "high"    Social History   Social History  . Marital status: Divorced    Spouse name: N/A  . Number of children: N/A  . Years of education: N/A   Occupational History  . Not on file.   Social History Main Topics  . Smoking status: Current Every Day Smoker    Packs/day: 0.50    Years: 35.00    Types: Cigarettes  . Smokeless tobacco: Never  Used     Comment: Sometimes less.  . Alcohol use No  . Drug use: No  . Sexual activity: No   Other Topics Concern  . Not on file   Social History Narrative   Patient currently lives at home alone, but does have a home worker who visits her throughout the week to help with things around the house.  Patient states that she still tries to remain active and walks daily.  She is has been divorced for sometime and has one son.  She currently lives off social security.  Patient reports using approximately half a pack of cigarettes daily for 20 years.  She denies any alcohol or illicit drug use.  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       Review of Systems: General: negative for chills, fever, night sweats or weight changes.  Cardiovascular: negative for chest pain, dyspnea on exertion, edema, orthopnea, palpitations, paroxysmal nocturnal dyspnea or shortness of breath Dermatological: negative for rash Respiratory: negative for cough or wheezing Urologic: negative for hematuria Abdominal: negative for nausea, vomiting, diarrhea, bright red blood per rectum, melena, or hematemesis Neurologic: negative for visual changes, syncope, or dizziness All other systems reviewed and are otherwise negative except as noted above.    Blood pressure 112/68, pulse 69, height 5\' 6"  (1.676 m), weight 145 lb (65.8 kg).  General appearance: alert and no distress Neck: no adenopathy, no carotid bruit, no JVD, supple, symmetrical, trachea midline and thyroid not enlarged, symmetric, no tenderness/mass/nodules Lungs: clear to auscultation bilaterally Heart: regular rate and rhythm, S1, S2 normal, no murmur, click, rub or gallop Extremities: extremities normal, atraumatic, no cyanosis or edema  EKG sinus rhythm at 69 without ST or T-wave changes. I personally reviewed this EKG  ASSESSMENT AND PLAN:   Hyperlipidemia History of hyperlipidemia on  statin therapy followed by her PCP  TOBACCO ABUSE History of continued tobacco abuse recalcitrant to risk factor modification  Essential hypertension History of essential hypertension blood pressure 112/68. She is on hydrochlorothiazide. Continue current meds at current dose  Critical lower limb ischemia Status post staged right and left SFA intervention in November and December of last year. Dopplers performed 06/04/16 reveal essentially normal ABIs with normal velocities. She remains on Plavix. We'll continue to follow her noninvasively on annual basis.      Lorretta Harp MD FACP,FACC,FAHA, [facility] 10/21/2016 11:39 AM

## 2016-10-21 NOTE — Patient Instructions (Signed)
Medication Instructions: Your physician recommends that you continue on your current medications as directed. Please refer to the Current Medication list given to you today.   Testing/Procedures: Your physician has requested that you have a lower extremity arterial duplex. During this test, ultrasound is used to evaluate arterial blood flow in the legs. Allow one hour for this exam. There are no restrictions or special instructions.  Your physician has requested that you have an ankle brachial index (ABI). During this test an ultrasound and blood pressure cuff are used to evaluate the arteries that supply the arms and legs with blood. Allow thirty minutes for this exam. There are no restrictions or special instructions.  (January 2019)   Follow-Up: Your physician wants you to follow-up in: 1 year with [name]. You will receive a reminder letter in the mail two months in advance. If you don't receive a letter, please call our office to schedule the follow-up appointment.  If you need a refill on your cardiac medications before your next appointment, please call your pharmacy.

## 2016-10-21 NOTE — Assessment & Plan Note (Signed)
History of hyperlipidemia on statin therapy followed by her PCP. 

## 2016-10-21 NOTE — Assessment & Plan Note (Signed)
History of continued tobacco abuse recalcitrant to risk factor modification

## 2016-10-21 NOTE — Assessment & Plan Note (Signed)
History of essential hypertension blood pressure 112/68. She is on hydrochlorothiazide. Continue current meds at current dose

## 2016-11-10 ENCOUNTER — Telehealth: Payer: Self-pay | Admitting: Emergency Medicine

## 2016-11-10 NOTE — Telephone Encounter (Signed)
LOST TO FOLLOWUP 

## 2016-12-04 ENCOUNTER — Ambulatory Visit (HOSPITAL_COMMUNITY)
Admission: RE | Admit: 2016-12-04 | Payer: Medicare Other | Source: Ambulatory Visit | Attending: Cardiovascular Disease | Admitting: Cardiovascular Disease

## 2016-12-17 ENCOUNTER — Encounter: Payer: Medicare Other | Admitting: Internal Medicine

## 2016-12-23 ENCOUNTER — Ambulatory Visit (INDEPENDENT_AMBULATORY_CARE_PROVIDER_SITE_OTHER): Payer: Medicare Other | Admitting: Podiatry

## 2016-12-23 ENCOUNTER — Encounter: Payer: Self-pay | Admitting: Podiatry

## 2016-12-23 DIAGNOSIS — B351 Tinea unguium: Secondary | ICD-10-CM

## 2016-12-23 DIAGNOSIS — M79676 Pain in unspecified toe(s): Secondary | ICD-10-CM

## 2016-12-23 DIAGNOSIS — I739 Peripheral vascular disease, unspecified: Secondary | ICD-10-CM | POA: Diagnosis not present

## 2016-12-23 DIAGNOSIS — Q828 Other specified congenital malformations of skin: Secondary | ICD-10-CM | POA: Diagnosis not present

## 2016-12-23 NOTE — Progress Notes (Signed)
Patient ID: [name], female   DOB: [birth year], 76 y.o.   MRN: [id number]   Subjective: This patient presents for scheduled visit complaining of thickened elongated toenails or cough walking wearing shoes and request toenail debridement. Also, patient complaining of extremely painful plantar calluses left greater than right  Objective:  hard of hearing Orientated 3 DP pulses 1/4 bilaterally PT pulses 0/4 bilaterally Capillary reflex delay bilaterally Sensation to 10 g monofilament wire intact 5/5 bilaterally Ankle reflex reactive bilaterally No open skin lesions bilaterally Absent toenails 1, 2, 3 left The remaining toenails elongated, brittle, deformed, discolored and tender to palpation Multiple surgical scars dorsal aspect right and left feet Atrophic skin with absent hair growth bilaterally Manual motor testing dorsi flexion, plantar flexion 5/5 bilaterally  Assessment: Peripheral arterial disease Symptomatic Mycotic toenails 7 Porokeratosis 1 plantar callus 1  Plan: Debridement toenails 7 mechanically and electrically without any bleeding Debride plantar lesions 2 without any bleeding Pad plantar lesion left  Reappoint 3 months

## 2016-12-23 NOTE — Progress Notes (Signed)
   Subjective:    Patient ID: [name], female    DOB: [birth year], 76 y.o.   MRN: [id number]  HPI    Review of Systems  All other systems reviewed and are negative.      Objective:   Physical Exam        Assessment & Plan:

## 2016-12-26 ENCOUNTER — Ambulatory Visit (HOSPITAL_COMMUNITY)
Admission: RE | Admit: 2016-12-26 | Payer: Medicare Other | Source: Ambulatory Visit | Attending: Cardiovascular Disease | Admitting: Cardiovascular Disease

## 2017-01-07 ENCOUNTER — Encounter: Payer: Medicare Other | Admitting: Internal Medicine

## 2017-01-10 DIAGNOSIS — I129 Hypertensive chronic kidney disease with stage 1 through stage 4 chronic kidney disease, or unspecified chronic kidney disease: Secondary | ICD-10-CM | POA: Diagnosis not present

## 2017-01-10 DIAGNOSIS — N3090 Cystitis, unspecified without hematuria: Secondary | ICD-10-CM | POA: Insufficient documentation

## 2017-01-10 DIAGNOSIS — R918 Other nonspecific abnormal finding of lung field: Secondary | ICD-10-CM | POA: Insufficient documentation

## 2017-01-10 DIAGNOSIS — F17210 Nicotine dependence, cigarettes, uncomplicated: Secondary | ICD-10-CM | POA: Insufficient documentation

## 2017-01-10 DIAGNOSIS — N183 Chronic kidney disease, stage 3 (moderate): Secondary | ICD-10-CM | POA: Diagnosis not present

## 2017-01-10 DIAGNOSIS — R109 Unspecified abdominal pain: Secondary | ICD-10-CM | POA: Diagnosis not present

## 2017-01-10 DIAGNOSIS — J449 Chronic obstructive pulmonary disease, unspecified: Secondary | ICD-10-CM | POA: Insufficient documentation

## 2017-01-10 DIAGNOSIS — R1013 Epigastric pain: Secondary | ICD-10-CM | POA: Diagnosis not present

## 2017-01-10 DIAGNOSIS — R1010 Upper abdominal pain, unspecified: Secondary | ICD-10-CM | POA: Diagnosis present

## 2017-01-10 DIAGNOSIS — N3000 Acute cystitis without hematuria: Secondary | ICD-10-CM | POA: Diagnosis not present

## 2017-01-10 DIAGNOSIS — Z7982 Long term (current) use of aspirin: Secondary | ICD-10-CM | POA: Insufficient documentation

## 2017-01-10 DIAGNOSIS — Z79899 Other long term (current) drug therapy: Secondary | ICD-10-CM | POA: Diagnosis not present

## 2017-01-10 DIAGNOSIS — R1030 Lower abdominal pain, unspecified: Secondary | ICD-10-CM | POA: Diagnosis not present

## 2017-01-11 ENCOUNTER — Encounter (HOSPITAL_COMMUNITY): Payer: Self-pay | Admitting: Nurse Practitioner

## 2017-01-11 ENCOUNTER — Emergency Department (HOSPITAL_COMMUNITY): Payer: Medicare Other

## 2017-01-11 ENCOUNTER — Emergency Department (HOSPITAL_COMMUNITY)
Admission: EM | Admit: 2017-01-11 | Discharge: 2017-01-11 | Disposition: A | Discharge status: Home / Self Care | Payer: Medicare Other | Attending: Emergency Medicine | Admitting: Emergency Medicine

## 2017-01-11 DIAGNOSIS — N3000 Acute cystitis without hematuria: Secondary | ICD-10-CM

## 2017-01-11 DIAGNOSIS — R1030 Lower abdominal pain, unspecified: Secondary | ICD-10-CM | POA: Diagnosis not present

## 2017-01-11 DIAGNOSIS — R918 Other nonspecific abnormal finding of lung field: Secondary | ICD-10-CM

## 2017-01-11 DIAGNOSIS — R109 Unspecified abdominal pain: Secondary | ICD-10-CM | POA: Diagnosis not present

## 2017-01-11 DIAGNOSIS — R1013 Epigastric pain: Secondary | ICD-10-CM

## 2017-01-11 LAB — URINALYSIS, ROUTINE W REFLEX MICROSCOPIC
BILIRUBIN URINE: NEGATIVE
GLUCOSE, UA: NEGATIVE mg/dL
Ketones, ur: NEGATIVE mg/dL
NITRITE: NEGATIVE
Protein, ur: NEGATIVE mg/dL
SPECIFIC GRAVITY, URINE: 1.013 (ref 1.005–1.030)
pH: 5 (ref 5.0–8.0)

## 2017-01-11 LAB — I-STAT TROPONIN, ED: TROPONIN I, POC: 0.01 ng/mL (ref 0.00–0.08)

## 2017-01-11 LAB — CBC
HCT: 40.7 % (ref 36.0–46.0)
HEMOGLOBIN: 13.5 g/dL (ref 12.0–15.0)
MCH: 31.5 pg (ref 26.0–34.0)
MCHC: 33.2 g/dL (ref 30.0–36.0)
MCV: 94.9 fL (ref 78.0–100.0)
PLATELETS: 207 10*3/uL (ref 150–400)
RBC: 4.29 MIL/uL (ref 3.87–5.11)
RDW: 14 % (ref 11.5–15.5)
WBC: 6.4 10*3/uL (ref 4.0–10.5)

## 2017-01-11 LAB — COMPREHENSIVE METABOLIC PANEL
ALK PHOS: 70 U/L (ref 38–126)
ALT: 11 U/L — AB (ref 14–54)
ANION GAP: 8 (ref 5–15)
AST: 13 U/L — ABNORMAL LOW (ref 15–41)
Albumin: 3.7 g/dL (ref 3.5–5.0)
BUN: 34 mg/dL — ABNORMAL HIGH (ref 6–20)
CALCIUM: 9.6 mg/dL (ref 8.9–10.3)
CO2: 27 mmol/L (ref 22–32)
CREATININE: 1.49 mg/dL — AB (ref 0.44–1.00)
Chloride: 103 mmol/L (ref 101–111)
GFR, EST AFRICAN AMERICAN: 38 mL/min — AB (ref >60)
GFR, EST NON AFRICAN AMERICAN: 33 mL/min — AB (ref >60)
Glucose, Bld: 116 mg/dL — ABNORMAL HIGH (ref 65–99)
Potassium: 4.9 mmol/L (ref 3.5–5.1)
SODIUM: 138 mmol/L (ref 135–145)
Total Bilirubin: 0.7 mg/dL (ref 0.3–1.2)
Total Protein: 6.9 g/dL (ref 6.5–8.1)

## 2017-01-11 LAB — LIPASE, BLOOD: LIPASE: 22 U/L (ref 11–51)

## 2017-01-11 LAB — I-STAT CG4 LACTIC ACID, ED: Lactic Acid, Venous: 1.01 mmol/L (ref 0.5–1.9)

## 2017-01-11 MED ORDER — SODIUM CHLORIDE 0.9 % IV SOLN
INTRAVENOUS | Status: DC
  Administered 2017-01-11: 05:00:00 via INTRAVENOUS

## 2017-01-11 MED ORDER — FAMOTIDINE 20 MG PO TABS: 20.0000 mg | ORAL_TABLET | Freq: Two times a day (BID) | ORAL | 0 refills | Status: DC

## 2017-01-11 MED ORDER — CEFTRIAXONE SODIUM 1 G IJ SOLR
1.0000 g | Freq: Once | INTRAMUSCULAR | Status: AC
  Administered 2017-01-11: 1 g via INTRAVENOUS
  Filled 2017-01-11: qty 10

## 2017-01-11 MED ORDER — IOPAMIDOL (ISOVUE-300) INJECTION 61%
80.0000 mL | Freq: Once | INTRAVENOUS | Status: AC | PRN
  Administered 2017-01-11: 80 mL via INTRAVENOUS

## 2017-01-11 MED ORDER — CEPHALEXIN 500 MG PO CAPS: 500.0000 mg | ORAL_CAPSULE | Freq: Two times a day (BID) | ORAL | 0 refills | Status: DC

## 2017-01-11 MED ORDER — FAMOTIDINE IN NACL 20-0.9 MG/50ML-% IV SOLN
20.0000 mg | Freq: Once | INTRAVENOUS | Status: AC
  Administered 2017-01-11: 20 mg via INTRAVENOUS
  Filled 2017-01-11: qty 50

## 2017-01-11 MED ORDER — IOPAMIDOL (ISOVUE-300) INJECTION 61%
INTRAVENOUS | Status: AC
  Filled 2017-01-11: qty 100

## 2017-01-11 MED ORDER — GI COCKTAIL ~~LOC~~
30.0000 mL | Freq: Once | ORAL | Status: AC
  Administered 2017-01-11: 30 mL via ORAL
  Filled 2017-01-11: qty 30

## 2017-01-11 NOTE — ED Notes (Signed)
No respiratory or acute distress noted visitor at bedside call light in reach.

## 2017-01-11 NOTE — ED Provider Notes (Signed)
  Face-to-face evaluation   History: She presents for 1 day history of abdominal pain.   Physical exam: Abdomen firm, diffusely tender, with guarding.  Patient is alert and cooperative.  No respiratory distress.  Medical screening examination/treatment/procedure(s) were conducted as a shared visit with non-physician practitioner(s) and myself.  I personally evaluated the patient during the encounter    Daleen Bo, MD 01/11/17 [phone]

## 2017-01-11 NOTE — ED Notes (Signed)
Call 1x and there was no response

## 2017-01-11 NOTE — ED Notes (Signed)
Talked with son about follow up with family doctor about mass in chest.

## 2017-01-11 NOTE — ED Notes (Signed)
Patient transported to CT 

## 2017-01-11 NOTE — Discharge Instructions (Signed)
Your chest x-ray is not normal, it is concerning for a possible mass in the lung. You really need to follow with your primary care doctor to have this he waited as soon as possible.  Please take the Pepcid to control acid in your stomach, I think this will give you relief from your stomach pain.  Do not hesitate to return to the emergency department for any new, worsening or concerning symptoms.

## 2017-01-11 NOTE — ED Notes (Signed)
No respiratory or acute distress noted resting in bed with eyes closed call light in reach no reaction to medication noted.
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
## 2017-01-11 NOTE — ED Triage Notes (Signed)
Pt is c/o diffuse and severe abdominal pain that started yesterday.

## 2017-01-12 LAB — URINE CULTURE

## 2017-01-13 ENCOUNTER — Telehealth: Payer: Self-pay | Admitting: Internal Medicine

## 2017-01-14 ENCOUNTER — Emergency Department (HOSPITAL_COMMUNITY): Payer: Medicare Other

## 2017-01-14 ENCOUNTER — Encounter (HOSPITAL_COMMUNITY): Payer: Self-pay | Admitting: Emergency Medicine

## 2017-01-14 ENCOUNTER — Observation Stay (HOSPITAL_COMMUNITY)
Admission: EM | Admit: 2017-01-14 | Discharge: 2017-01-17 | Disposition: A | Discharge status: Home / Self Care | Payer: Medicare Other | Attending: Internal Medicine | Admitting: Internal Medicine

## 2017-01-14 DIAGNOSIS — N189 Chronic kidney disease, unspecified: Secondary | ICD-10-CM

## 2017-01-14 DIAGNOSIS — N179 Acute kidney failure, unspecified: Secondary | ICD-10-CM | POA: Diagnosis present

## 2017-01-14 DIAGNOSIS — K829 Disease of gallbladder, unspecified: Secondary | ICD-10-CM

## 2017-01-14 DIAGNOSIS — Z9049 Acquired absence of other specified parts of digestive tract: Secondary | ICD-10-CM | POA: Diagnosis present

## 2017-01-14 DIAGNOSIS — N183 Chronic kidney disease, stage 3 unspecified: Secondary | ICD-10-CM | POA: Diagnosis present

## 2017-01-14 DIAGNOSIS — R1084 Generalized abdominal pain: Secondary | ICD-10-CM | POA: Diagnosis not present

## 2017-01-14 DIAGNOSIS — I129 Hypertensive chronic kidney disease with stage 1 through stage 4 chronic kidney disease, or unspecified chronic kidney disease: Secondary | ICD-10-CM | POA: Diagnosis not present

## 2017-01-14 DIAGNOSIS — Z7982 Long term (current) use of aspirin: Secondary | ICD-10-CM | POA: Diagnosis not present

## 2017-01-14 DIAGNOSIS — Z7902 Long term (current) use of antithrombotics/antiplatelets: Secondary | ICD-10-CM | POA: Diagnosis not present

## 2017-01-14 DIAGNOSIS — K8010 Calculus of gallbladder with chronic cholecystitis without obstruction: Principal | ICD-10-CM | POA: Insufficient documentation

## 2017-01-14 DIAGNOSIS — K819 Cholecystitis, unspecified: Secondary | ICD-10-CM

## 2017-01-14 DIAGNOSIS — H548 Legal blindness, as defined in USA: Secondary | ICD-10-CM | POA: Insufficient documentation

## 2017-01-14 DIAGNOSIS — E785 Hyperlipidemia, unspecified: Secondary | ICD-10-CM | POA: Diagnosis not present

## 2017-01-14 DIAGNOSIS — F17200 Nicotine dependence, unspecified, uncomplicated: Secondary | ICD-10-CM | POA: Diagnosis present

## 2017-01-14 DIAGNOSIS — R109 Unspecified abdominal pain: Secondary | ICD-10-CM | POA: Diagnosis not present

## 2017-01-14 DIAGNOSIS — I10 Essential (primary) hypertension: Secondary | ICD-10-CM | POA: Diagnosis not present

## 2017-01-14 DIAGNOSIS — K8020 Calculus of gallbladder without cholecystitis without obstruction: Secondary | ICD-10-CM | POA: Diagnosis not present

## 2017-01-14 DIAGNOSIS — Z79899 Other long term (current) drug therapy: Secondary | ICD-10-CM | POA: Diagnosis not present

## 2017-01-14 DIAGNOSIS — Z86718 Personal history of other venous thrombosis and embolism: Secondary | ICD-10-CM | POA: Diagnosis not present

## 2017-01-14 DIAGNOSIS — F17210 Nicotine dependence, cigarettes, uncomplicated: Secondary | ICD-10-CM | POA: Insufficient documentation

## 2017-01-14 DIAGNOSIS — J449 Chronic obstructive pulmonary disease, unspecified: Secondary | ICD-10-CM | POA: Diagnosis present

## 2017-01-14 DIAGNOSIS — I739 Peripheral vascular disease, unspecified: Secondary | ICD-10-CM | POA: Diagnosis not present

## 2017-01-14 DIAGNOSIS — Z419 Encounter for procedure for purposes other than remedying health state, unspecified: Secondary | ICD-10-CM

## 2017-01-14 LAB — LIPASE, BLOOD: LIPASE: 22 U/L (ref 11–51)

## 2017-01-14 LAB — COMPREHENSIVE METABOLIC PANEL
ALT: 10 U/L — AB (ref 14–54)
AST: 14 U/L — AB (ref 15–41)
Albumin: 3.7 g/dL (ref 3.5–5.0)
Alkaline Phosphatase: 72 U/L (ref 38–126)
Anion gap: 9 (ref 5–15)
BILIRUBIN TOTAL: 0.7 mg/dL (ref 0.3–1.2)
BUN: 39 mg/dL — AB (ref 6–20)
CO2: 26 mmol/L (ref 22–32)
CREATININE: 2.05 mg/dL — AB (ref 0.44–1.00)
Calcium: 9.1 mg/dL (ref 8.9–10.3)
Chloride: 102 mmol/L (ref 101–111)
GFR calc Af Amer: 26 mL/min — ABNORMAL LOW (ref >60)
GFR, EST NON AFRICAN AMERICAN: 22 mL/min — AB (ref >60)
Glucose, Bld: 106 mg/dL — ABNORMAL HIGH (ref 65–99)
Potassium: 3.9 mmol/L (ref 3.5–5.1)
Sodium: 137 mmol/L (ref 135–145)
TOTAL PROTEIN: 6.9 g/dL (ref 6.5–8.1)

## 2017-01-14 LAB — URINALYSIS, ROUTINE W REFLEX MICROSCOPIC
BACTERIA UA: NONE SEEN
BILIRUBIN URINE: NEGATIVE
Glucose, UA: NEGATIVE mg/dL
Ketones, ur: NEGATIVE mg/dL
Nitrite: NEGATIVE
Protein, ur: NEGATIVE mg/dL
SPECIFIC GRAVITY, URINE: 1.017 (ref 1.005–1.030)
pH: 5 (ref 5.0–8.0)

## 2017-01-14 LAB — CBC WITH DIFFERENTIAL/PLATELET
BASOS ABS: 0.1 10*3/uL (ref 0.0–0.1)
Basophils Relative: 1 %
Eosinophils Absolute: 0.1 10*3/uL (ref 0.0–0.7)
Eosinophils Relative: 2 %
HEMATOCRIT: 40.8 % (ref 36.0–46.0)
Hemoglobin: 13.5 g/dL (ref 12.0–15.0)
LYMPHS ABS: 1.5 10*3/uL (ref 0.7–4.0)
LYMPHS PCT: 19 %
MCH: 31 pg (ref 26.0–34.0)
MCHC: 33.1 g/dL (ref 30.0–36.0)
MCV: 93.8 fL (ref 78.0–100.0)
MONO ABS: 0.9 10*3/uL (ref 0.1–1.0)
Monocytes Relative: 11 %
NEUTROS ABS: 5.1 10*3/uL (ref 1.7–7.7)
Neutrophils Relative %: 67 %
Platelets: 188 10*3/uL (ref 150–400)
RBC: 4.35 MIL/uL (ref 3.87–5.11)
RDW: 13.9 % (ref 11.5–15.5)
WBC: 7.6 10*3/uL (ref 4.0–10.5)

## 2017-01-14 LAB — I-STAT CHEM 8, ED
BUN: 37 mg/dL — AB (ref 6–20)
CHLORIDE: 101 mmol/L (ref 101–111)
CREATININE: 1.9 mg/dL — AB (ref 0.44–1.00)
Calcium, Ion: 1.12 mmol/L — ABNORMAL LOW (ref 1.15–1.40)
GLUCOSE: 104 mg/dL — AB (ref 65–99)
HEMATOCRIT: 44 % (ref 36.0–46.0)
HEMOGLOBIN: 15 g/dL (ref 12.0–15.0)
POTASSIUM: 3.9 mmol/L (ref 3.5–5.1)
Sodium: 137 mmol/L (ref 135–145)
TCO2: 24 mmol/L (ref 22–32)

## 2017-01-14 MED ORDER — ONDANSETRON HCL 4 MG PO TABS: 4.0000 mg | ORAL_TABLET | Freq: Four times a day (QID) | ORAL | Status: DC | PRN

## 2017-01-14 MED ORDER — FENTANYL CITRATE (PF) 100 MCG/2ML IJ SOLN
25.0000 ug | Freq: Once | INTRAMUSCULAR | Status: AC
  Administered 2017-01-14: 25 ug via INTRAVENOUS
  Filled 2017-01-14: qty 2

## 2017-01-14 MED ORDER — NICOTINE 14 MG/24HR TD PT24
14.0000 mg | MEDICATED_PATCH | TRANSDERMAL | Status: DC
  Administered 2017-01-14 – 2017-01-16 (×3): 14 mg via TRANSDERMAL
  Filled 2017-01-14 (×3): qty 1

## 2017-01-14 MED ORDER — ACETAMINOPHEN 325 MG PO TABS
650.0000 mg | ORAL_TABLET | Freq: Four times a day (QID) | ORAL | Status: DC | PRN
  Administered 2017-01-15 – 2017-01-16 (×2): 650 mg via ORAL
  Filled 2017-01-14 (×2): qty 2

## 2017-01-14 MED ORDER — PANTOPRAZOLE SODIUM 40 MG IV SOLR
40.0000 mg | INTRAVENOUS | Status: DC
  Administered 2017-01-14 – 2017-01-16 (×2): 40 mg via INTRAVENOUS
  Filled 2017-01-14 (×2): qty 40

## 2017-01-14 MED ORDER — ALBUTEROL SULFATE (2.5 MG/3ML) 0.083% IN NEBU
2.5000 mg | INHALATION_SOLUTION | Freq: Four times a day (QID) | RESPIRATORY_TRACT | Status: DC | PRN
  Administered 2017-01-17: 2.5 mg via RESPIRATORY_TRACT
  Filled 2017-01-14: qty 3

## 2017-01-14 MED ORDER — ACETAMINOPHEN 650 MG RE SUPP: 650.0000 mg | Freq: Four times a day (QID) | RECTAL | Status: DC | PRN

## 2017-01-14 MED ORDER — ATORVASTATIN CALCIUM 40 MG PO TABS
40.0000 mg | ORAL_TABLET | Freq: Every day | ORAL | Status: DC
  Administered 2017-01-14 – 2017-01-16 (×2): 40 mg via ORAL
  Filled 2017-01-14 (×2): qty 1

## 2017-01-14 MED ORDER — SENNOSIDES-DOCUSATE SODIUM 8.6-50 MG PO TABS: 1.0000 | ORAL_TABLET | Freq: Every evening | ORAL | Status: DC | PRN

## 2017-01-14 MED ORDER — ONDANSETRON HCL 4 MG/2ML IJ SOLN
4.0000 mg | Freq: Four times a day (QID) | INTRAMUSCULAR | Status: DC | PRN
  Administered 2017-01-16: 4 mg via INTRAVENOUS

## 2017-01-14 MED ORDER — ACETAMINOPHEN 500 MG PO TABS: 500.0000 mg | ORAL_TABLET | Freq: Four times a day (QID) | ORAL | Status: DC | PRN

## 2017-01-14 MED ORDER — SODIUM CHLORIDE 0.9 % IV SOLN
INTRAVENOUS | Status: DC
  Administered 2017-01-14 – 2017-01-16 (×3): via INTRAVENOUS

## 2017-01-14 MED ORDER — PREDNISOLONE ACETATE 1 % OP SUSP
1.0000 [drp] | Freq: Every day | OPHTHALMIC | Status: DC
  Administered 2017-01-15 – 2017-01-17 (×2): 1 [drp] via OPHTHALMIC
  Filled 2017-01-14: qty 5

## 2017-01-14 MED ORDER — VITAMIN D 1000 UNITS PO TABS
1000.0000 [IU] | ORAL_TABLET | Freq: Every day | ORAL | Status: DC
  Administered 2017-01-14 – 2017-01-17 (×3): 1000 [IU] via ORAL
  Filled 2017-01-14 (×3): qty 1

## 2017-01-14 MED ORDER — GI COCKTAIL ~~LOC~~
30.0000 mL | Freq: Once | ORAL | Status: AC
  Administered 2017-01-14: 30 mL via ORAL
  Filled 2017-01-14: qty 30

## 2017-01-14 NOTE — Consult Note (Signed)
[facility] Surgery Consult Note  GENTRY SEEBER [birth year]  [id number].    Requesting MD: Duffy Bruce Chief Complaint/Reason for Consult: Abdominal pain  HPI:  [name] is a 76yo female who presented to [facility] earlier today complaining of upper abdominal pain. Patient was seen in ED 9/2 for the same complaint; pain improved while in the ED with GI cocktail and she was discharged home with keflex for UTI. States that this started over a week ago. It has been intermittent until last night, now it is constant and severe. Denies nausea, vomiting, fever, chills, dysuria, diarrhea, or constipation, just pain. Prior to this week states that she has never had pain like this before. She has had nothing to eat/drink today, and her last BM was 2 days ago.  Hospital workup: - u/s shows cholelithiasis and gallbladder sludge, mild diffuse gallbladder wall thickening. No pericholecystic fluid or sonographic Murphy's sign. Findings are most compatible with chronic cholecystitis - WBC WNL, afebrile - LFTs are not elevated  PMH significant for HTN, H/o DVT, COPD, CKD, HLD, partially deaf and blind Abdominal surgical history: none Anticoagulants: Plavix (last dose 9/4) Every day smoker Lives home alone and has aide that helps her daily  ROS: Review of Systems  Constitutional: Negative.   HENT: Negative.   Eyes: Negative.   Respiratory: Negative.   Cardiovascular: Negative.   Gastrointestinal: Positive for abdominal pain. Negative for blood in stool, constipation, diarrhea, heartburn, nausea and vomiting.  Genitourinary: Negative.   Musculoskeletal: Negative.   Skin: Negative.   Neurological: Negative.    All systems reviewed and otherwise negative except for as above  Family History  Problem Relation Age of Onset  . Cancer Maternal Grandmother        liver  . Cancer Maternal Uncle        colon  . Cancer Mother        liver  . Cancer Brother        Unknown  . Cancer  Sister        Unknown    Past Medical History:  Diagnosis Date  . Arthritis    "left shoulder" (04/07/2016)  . Blind    s/p bilateral corneal transplant; "legally blind in both eyes" (04/07/2016)  . CKD (chronic kidney disease), stage III   . COPD (chronic obstructive pulmonary disease) ([place])   . DVT (deep venous thrombosis) ([place]) "years ago"   "right thigh"    . HEARING LOSS   . Hyperlipidemia   . Hypertension   . PAD (peripheral artery disease) ([place])    a. 03/2016 Periph Angio: L SFA 99p, 78mw/ one vessel runoff. R SFA 1034m/ one vessel runofff;  b. 04/07/2016 PTA L SFA ([place] 1 LS atherecromty w/ DEBA). c. 04/24/16 s/p PTA to R SFA, right tibioperoneal trunk and posterior tibial artery.  . Phlebitis   . Tobacco abuse     Past Surgical History:  Procedure Laterality Date  . CARPAL TUNNEL RELEASE Right 04/2003   /nArchie Endo/16/2012  . EYE SURGERY Bilateral    s/p bilateral corneal transplant  . FRACTURE SURGERY    . HAMMER TOE SURGERY Left 01/2010   /nArchie Endo/16/2011  [place]  . PERIPHERAL VASCULAR CATHETERIZATION N/A 03/17/2016   Procedure: Lower Extremity Intervention;  Surgeon: JoLorretta HarpMD;  [facility];  Service: Cardiovascular;  Laterality: N/A;  . PERIPHERAL VASCULAR CATHETERIZATION Bilateral 04/07/2016   Procedure: Lower  Extremity Angiography;  Surgeon: Lorretta Harp, MD;  Location: [place] [facility];  Service: Cardiovascular;  Laterality: Bilateral;  . PERIPHERAL VASCULAR CATHETERIZATION Left 04/07/2016   Procedure: Peripheral Vascular Atherectomy;  Surgeon: Lorretta Harp, MD;  Location: [place] [facility];  Service: Cardiovascular;  Laterality: Left;  SFA  . PERIPHERAL VASCULAR CATHETERIZATION  04/24/2016  . PERIPHERAL VASCULAR CATHETERIZATION Right 04/24/2016   Procedure: Peripheral Vascular Atherectomy;  Surgeon: Lorretta Harp, MD;  Location: [place] [facility];  Service:  Cardiovascular;  Laterality: Right;  superficial femoral  . PERIPHERAL VASCULAR CATHETERIZATION Right 04/24/2016   Procedure: Peripheral Vascular Balloon Angioplasty;  Surgeon: Lorretta Harp, MD;  Location: [place] CV LAB;  Service: Cardiovascular;  Laterality: Right;  Tibeoperoneal trunk and posterior tibial  . SHOULDER ARTHROSCOPY W/ ROTATOR CUFF REPAIR Right 09/2002   Archie Endo 09/25/2010  . SHOULDER OPEN ROTATOR CUFF REPAIR Right 1973   secondary numbness in R hand  . SHOULDER OPEN ROTATOR CUFF REPAIR Left 06/2003   Archie Endo 09/25/2010  . WRIST FRACTURE SURGERY Right     Social History:  reports that she has been smoking Cigarettes.  She has a 17.50 pack-year smoking history. She has never used smokeless tobacco. She reports that she does not drink alcohol or use drugs.  Allergies:  Allergies  Allergen Reactions  . Codeine Other (See Comments)    REACTION: Pt c/o "feeling high"  . Morphine Other (See Comments)    REACTION: feels funny and "high"     (Not in a hospital admission)  Prior to Admission medications   Medication Sig Start Date End Date Taking? Authorizing Provider  acetaminophen (TYLENOL) 500 MG tablet Take 500 mg by mouth every 6 (six) hours as needed for mild pain, moderate pain or headache.   Yes [provider]  aspirin EC 81 MG EC tablet Take 1 tablet (81 mg total) by mouth daily. 03/19/16  Yes Arbutus Leas, NP  atorvastatin (LIPITOR) 40 MG tablet Take 40 mg by mouth daily.   Yes [provider]  cephALEXin (KEFLEX) 500 MG capsule Take 1 capsule (500 mg total) by mouth 2 (two) times daily. 01/11/17  Yes Pisciotta, Elmyra Ricks, PA-C  clopidogrel (PLAVIX) 75 MG tablet Take 1 tablet (75 mg total) by mouth daily with breakfast. 03/19/16  Yes Jettie Booze E, NP  diclofenac sodium (VOLTAREN) 1 % GEL Apply 4 g topically 4 (four) times daily. Patient taking differently: Apply 4 g topically 4 (four) times daily as needed (PAIN).  07/25/16  Yes Ahmed, Chesley Mires, MD   famotidine (PEPCID) 20 MG tablet Take 1 tablet (20 mg total) by mouth 2 (two) times daily. 01/11/17  Yes Pisciotta, Elmyra Ricks, PA-C  hydrochlorothiazide (MICROZIDE) 12.5 MG capsule Take 1 capsule (12.5 mg total) by mouth daily. 04/30/16  Yes Sid Falcon, MD  nicotine (NICODERM CQ - DOSED IN MG/24 HOURS) 21 mg/24hr patch Place 1 patch (21 mg total) onto the skin daily. 07/25/16 07/25/17 Yes Ahmed, Chesley Mires, MD  prednisoLONE acetate (PRED FORTE) 1 % ophthalmic suspension Place 1 drop into both eyes daily.   Yes [provider]  Vitamin D, Cholecalciferol, 1000 units CAPS Take 1,000 Units by mouth daily. 09/02/16  Yes Sid Falcon, MD  albuterol (PROVENTIL HFA;VENTOLIN HFA) 108 (90 Base) MCG/ACT inhaler Inhale 2 puffs into the lungs every 6 (six) hours as needed for wheezing or shortness of breath. 08/15/15   Sid Falcon, MD  nicotine (NICODERM CQ - DOSED IN MG/24 HOURS) 14 mg/24hr patch  Place 1 patch (14 mg total) onto the skin daily. 07/25/16 07/25/17  Dellia Nims, MD  nicotine (NICODERM CQ - DOSED IN MG/24 HR) 7 mg/24hr patch Place 1 patch (7 mg total) onto the skin daily. 07/25/16 07/25/17  Dellia Nims, MD  ondansetron (ZOFRAN ODT) 8 MG disintegrating tablet Take 1 tablet (8 mg total) by mouth every 8 (eight) hours as needed for nausea or vomiting. Patient not taking: Reported on 01/11/2017 09/22/16   Jola Schmidt, MD  polyethylene glycol powder [facility]) powder Take 17 g by mouth daily. Patient not taking: Reported on 01/11/2017 09/22/16   Jola Schmidt, MD  traMADol (ULTRAM) 50 MG tablet Take 1-2 tablets (50-100 mg total) by mouth every 12 (twelve) hours as needed. Patient not taking: Reported on 01/11/2017 08/19/16   Sid Falcon, MD    Blood pressure (!) 143/66, pulse 68, temperature 98.2 F (36.8 C), resp. rate 20, SpO2 98 %. Physical Exam: General: pleasant, WD/WN AA female who is laying in bed in NAD HEENT: head is normocephalic, atraumatic.  Sclera are noninjected.  Pupils equal and  round.  Ears and nose without any masses or lesions.  Mouth is pink and moist. Poor dentition Heart: regular, rate, and rhythm.  No obvious murmurs, gallops, or rubs noted.  Palpable pedal pulses bilaterally Lungs: CTAB, no wheezes, rhonchi, or rales noted.  Respiratory effort nonlabored Abd: soft, ND, +BS, no masses, hernias, or organomegaly. +TTP RUQ and epigastric region MS: all 4 extremities are symmetrical with no cyanosis, clubbing, or edema. Skin: warm and dry with no masses, lesions, or rashes Psych: A&Ox3 with an appropriate affect. Neuro: cranial nerves grossly intact, extremity CSM intact bilaterally, normal speech  Results for orders placed or performed during the hospital encounter of 01/14/17 (from the past 48 hour(s))  Urinalysis, Routine w reflex microscopic     Status: Abnormal   Collection Time: 01/14/17  6:09 AM  Result Value Ref Range   Color, Urine YELLOW YELLOW   APPearance CLOUDY (A) CLEAR   Specific Gravity, Urine 1.017 1.005 - 1.030   pH 5.0 5.0 - 8.0   Glucose, UA NEGATIVE NEGATIVE mg/dL   Hgb urine dipstick SMALL (A) NEGATIVE   Bilirubin Urine NEGATIVE NEGATIVE   Ketones, ur NEGATIVE NEGATIVE mg/dL   Protein, ur NEGATIVE NEGATIVE mg/dL   Nitrite NEGATIVE NEGATIVE   Leukocytes, UA LARGE (A) NEGATIVE   RBC / HPF 6-30 0 - 5 RBC/hpf   WBC, UA TOO NUMEROUS TO COUNT 0 - 5 WBC/hpf   Bacteria, UA NONE SEEN NONE SEEN   Squamous Epithelial / LPF 0-5 (A) NONE SEEN   WBC Clumps PRESENT    Mucus PRESENT   I-Stat Chem 8, ED     Status: Abnormal   Collection Time: 01/14/17  6:29 AM  Result Value Ref Range   Sodium 137 135 - 145 mmol/L   Potassium 3.9 3.5 - 5.1 mmol/L   Chloride 101 101 - 111 mmol/L   BUN 37 (H) 6 - 20 mg/dL   Creatinine, Ser 1.90 (H) 0.44 - 1.00 mg/dL   Glucose, Bld 104 (H) 65 - 99 mg/dL   Calcium, Ion 1.12 (L) 1.15 - 1.40 mmol/L   TCO2 24 22 - 32 mmol/L   Hemoglobin 15.0 12.0 - 15.0 g/dL   HCT 44.0 36.0 - 46.0 %  CBC with Differential/Platelet      Status: None   Collection Time: 01/14/17  6:46 AM  Result Value Ref Range   WBC 7.6 4.0 - 10.5 K/uL  RBC 4.35 3.87 - 5.11 MIL/uL   Hemoglobin 13.5 12.0 - 15.0 g/dL   HCT 40.8 36.0 - 46.0 %   MCV 93.8 78.0 - 100.0 fL   MCH 31.0 26.0 - 34.0 pg   MCHC 33.1 30.0 - 36.0 g/dL   RDW 13.9 11.5 - 15.5 %   Platelets 188 150 - 400 K/uL   Neutrophils Relative % 67 %   Neutro Abs 5.1 1.7 - 7.7 K/uL   Lymphocytes Relative 19 %   Lymphs Abs 1.5 0.7 - 4.0 K/uL   Monocytes Relative 11 %   Monocytes Absolute 0.9 0.1 - 1.0 K/uL   Eosinophils Relative 2 %   Eosinophils Absolute 0.1 0.0 - 0.7 K/uL   Basophils Relative 1 %   Basophils Absolute 0.1 0.0 - 0.1 K/uL  Comprehensive metabolic panel     Status: Abnormal   Collection Time: 01/14/17  6:46 AM  Result Value Ref Range   Sodium 137 135 - 145 mmol/L   Potassium 3.9 3.5 - 5.1 mmol/L   Chloride 102 101 - 111 mmol/L   CO2 26 22 - 32 mmol/L   Glucose, Bld 106 (H) 65 - 99 mg/dL   BUN 39 (H) 6 - 20 mg/dL   Creatinine, Ser 2.05 (H) 0.44 - 1.00 mg/dL   Calcium 9.1 8.9 - 10.3 mg/dL   Total Protein 6.9 6.5 - 8.1 g/dL   Albumin 3.7 3.5 - 5.0 g/dL   AST 14 (L) 15 - 41 U/L   ALT 10 (L) 14 - 54 U/L   Alkaline Phosphatase 72 38 - 126 U/L   Total Bilirubin 0.7 0.3 - 1.2 mg/dL   GFR calc non Af Amer 22 (L) >60 mL/min   GFR calc Af Amer 26 (L) >60 mL/min    Comment: (NOTE) The eGFR has been calculated using the CKD EPI equation. This calculation has not been validated in all clinical situations. eGFR's persistently <60 mL/min signify possible Chronic Kidney Disease.    Anion gap 9 5 - 15  Lipase, blood     Status: None   Collection Time: 01/14/17  6:46 AM  Result Value Ref Range   Lipase 22 11 - 51 U/L   US Abdomen Complete  Result Date: 01/14/2017 CLINICAL DATA:  Abdominal pain for 1 week. History of cholelithiasis. EXAM: ABDOMEN ULTRASOUND COMPLETE COMPARISON:  01/11/2017 CT abdomen/ pelvis. FINDINGS: Gallbladder: Shadowing 1.6 cm gallbladder  neck gallstone. No significant gallbladder distention. Sludge in the gallbladder. Mild diffuse gallbladder wall thickening. No pericholecystic fluid or sonographic Murphy's sign. Common bile duct: Diameter: 4 mm Liver: No focal lesion identified. Within normal limits in parenchymal echogenicity. Portal vein is patent on color Doppler imaging with normal direction of blood flow towards the liver. IVC: No abnormality visualized. Pancreas: Not visualized due to overlying bowel gas. Spleen: Size and appearance within normal limits. Right Kidney: Length: 8.8 cm. Echogenicity within normal limits. No hydronephrosis. Simple 1.1 x 1.2 x 1.4 cm upper right renal cysts. Left Kidney: Length: 10.3 cm. Echogenicity within normal limits. No hydronephrosis. Simple 5.5 x 5.6 x 5.3 cm interpolar left renal cyst. Abdominal aorta: No aneurysm visualized. Other findings: None. IMPRESSION: 1. Cholelithiasis and gallbladder sludge. Mild diffuse gallbladder wall thickening. No pericholecystic fluid or sonographic Murphy's sign. 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 If there is clinical concern for acute cholecystitis, consider correlation with hepatobiliary scintigraphy study. 2. No biliary ductal dilatation. 3. Simple bilateral renal cysts. Otherwise normal abdominal  sonogram, with limitations as described. Electronically Signed   By: Ilona Sorrel M.D.   On: 01/14/2017 08:27      Assessment/Plan HTN H/o DVT on plavix (last dose 9/4) COPD CKD HLD Partially deaf and blind  Chronic cholecystitis  - no prior h/o abdominal surgery - u/s shows cholelithiasis and gallbladder sludge, mild diffuse gallbladder wall thickening. No pericholecystic fluid or sonographic Murphy's sign. Findings are most compatible with chronic cholecystitis - WBC WNL, afebrile - LFTs are not elevated   Plan - Patient likely has chronic cholecystitis and would benefit from  cholecystectomy.  Recommend admit to medicine for multiple medical problems. Hold plavix. Ok for clears until day of surgery. Patient is adamant that she is going home. I am concerned that if she goes home she will come right back due to persistent abdominal pain (this is already her second visit for the same complaint). Spoke with ED PA who is going to discuss with patient and her son. Will be available if needed.  Wellington Hampshire, [facility] Surgery 01/14/2017, 11:50 AM Pager: [phone] Consults: [phone] Mon-Fri 7:00 am-4:30 pm Sat-Sun 7:00 am-11:30 am

## 2017-01-14 NOTE — ED Notes (Signed)
Ultrasound at bedside

## 2017-01-14 NOTE — ED Notes (Signed)
Bed: VO36 Expected date:  Expected time:  Means of arrival:  Comments: 76 yr old abd pain
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
## 2017-01-14 NOTE — ED Notes (Addendum)
Patient states that she is wanting to go home to get clothes and her wallet and then come back for surgery. Patient has been informed multiple times by PA and this RN that patient will have to follow up with surgery to schedule out patient if she were to leave.  Patient states that she doesn't have a ride home and requesting ED staff to give her a ride home, informed patient that staff cant give a ride home, so patient wanting EMS to take her home, informed patient since she can physically walk EMS will charge her couple hundred dollars to transport her back home.  Made Santiago Glad PA aware of patient still wanting to go home and then come back to hospital

## 2017-01-14 NOTE — ED Triage Notes (Signed)
Per EMS [name] of epigastricpain which started 3 days ago, pt. Was seen here on the 2nd for the same problem, no relief since then. Also complained of N/V.denied diarrhea, afebrile.

## 2017-01-14 NOTE — ED Notes (Signed)
General surgery at bedside. 
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
## 2017-01-14 NOTE — Progress Notes (Signed)
CSW spoke with patient via bedside regarding concerns with patient wanting to leave AMA. Patient had concerns with a wallet at home, insurance cards, and clean clothes. CSW offered to contact son to bring requested belongings however patient denied. Patient stated "ill worry about it after my surgery" then proceeded to roll over. CSW updated RN and EDP.   Kingsley Spittle, [facility] Emergency Room Clinical Social Worker [phone]

## 2017-01-15 DIAGNOSIS — N179 Acute kidney failure, unspecified: Secondary | ICD-10-CM | POA: Diagnosis not present

## 2017-01-15 DIAGNOSIS — K8010 Calculus of gallbladder with chronic cholecystitis without obstruction: Secondary | ICD-10-CM | POA: Diagnosis not present

## 2017-01-15 DIAGNOSIS — N189 Chronic kidney disease, unspecified: Secondary | ICD-10-CM | POA: Diagnosis not present

## 2017-01-15 DIAGNOSIS — F17200 Nicotine dependence, unspecified, uncomplicated: Secondary | ICD-10-CM | POA: Diagnosis not present

## 2017-01-15 DIAGNOSIS — N183 Chronic kidney disease, stage 3 (moderate): Secondary | ICD-10-CM

## 2017-01-15 DIAGNOSIS — I739 Peripheral vascular disease, unspecified: Secondary | ICD-10-CM | POA: Diagnosis not present

## 2017-01-15 DIAGNOSIS — J449 Chronic obstructive pulmonary disease, unspecified: Secondary | ICD-10-CM | POA: Diagnosis not present

## 2017-01-15 DIAGNOSIS — R1084 Generalized abdominal pain: Secondary | ICD-10-CM

## 2017-01-15 DIAGNOSIS — K829 Disease of gallbladder, unspecified: Secondary | ICD-10-CM | POA: Diagnosis not present

## 2017-01-15 DIAGNOSIS — K8020 Calculus of gallbladder without cholecystitis without obstruction: Secondary | ICD-10-CM | POA: Diagnosis not present

## 2017-01-15 LAB — COMPREHENSIVE METABOLIC PANEL
ALBUMIN: 3.2 g/dL — AB (ref 3.5–5.0)
ALT: 10 U/L — ABNORMAL LOW (ref 14–54)
ANION GAP: 7 (ref 5–15)
AST: 12 U/L — AB (ref 15–41)
Alkaline Phosphatase: 63 U/L (ref 38–126)
BILIRUBIN TOTAL: 1.4 mg/dL — AB (ref 0.3–1.2)
BUN: 29 mg/dL — AB (ref 6–20)
CO2: 29 mmol/L (ref 22–32)
Calcium: 9.3 mg/dL (ref 8.9–10.3)
Chloride: 106 mmol/L (ref 101–111)
Creatinine, Ser: 1.61 mg/dL — ABNORMAL HIGH (ref 0.44–1.00)
GFR calc Af Amer: 35 mL/min — ABNORMAL LOW (ref >60)
GFR calc non Af Amer: 30 mL/min — ABNORMAL LOW (ref >60)
GLUCOSE: 104 mg/dL — AB (ref 65–99)
POTASSIUM: 4.3 mmol/L (ref 3.5–5.1)
SODIUM: 142 mmol/L (ref 135–145)
TOTAL PROTEIN: 6.3 g/dL — AB (ref 6.5–8.1)

## 2017-01-15 LAB — CBC
HEMATOCRIT: 38.2 % (ref 36.0–46.0)
Hemoglobin: 12.5 g/dL (ref 12.0–15.0)
MCH: 31.3 pg (ref 26.0–34.0)
MCHC: 32.7 g/dL (ref 30.0–36.0)
MCV: 95.7 fL (ref 78.0–100.0)
PLATELETS: 178 10*3/uL (ref 150–400)
RBC: 3.99 MIL/uL (ref 3.87–5.11)
RDW: 13.9 % (ref 11.5–15.5)
WBC: 5.5 10*3/uL (ref 4.0–10.5)

## 2017-01-15 LAB — PROTIME-INR
INR: 1.01
Prothrombin Time: 13.2 seconds (ref 11.4–15.2)

## 2017-01-15 LAB — APTT: APTT: 35 s (ref 24–36)

## 2017-01-15 NOTE — Progress Notes (Addendum)
PROGRESS NOTE  [name]:[id number] DOB: 04/16/1941 DOA: 01/14/2017 PCP: Sid Falcon, MD   LOS: 1 day   Brief Narrative / Interim history: [name] is a 76 y.o. female with medical history significant of hypertension, hyperlipidemia, peripheral vascular disease on aspirin and Plavix, active smoker, COPD, DVT, CKD presented with nausea vomiting and abdominal pain. Patient was seen in the ED on 9/2 for similar problem when patient clinically improved with GI cocktail. Patient was discharged with Keflex for possible UTI. The urine culture grew multipleis likely contaminant. Patient now presented with epigastric pain and right upper quadrant pain. Denies nausea vomiting, fever, chills, chest pain, shortness of breath. She was diagnosed with chronic cholecystitis and has been admitted. Surgery was consulted.  Assessment & Plan: Active Problems:   TOBACCO ABUSE   CKD (chronic kidney disease) stage 3, GFR 30-59 ml/min   COPD (chronic obstructive pulmonary disease) (HCC)   Pain in the abdomen   PVD (peripheral vascular disease) s/p LLE PTA 03/2016, RLE PTA 04/2016   Cholecystitis   Acute kidney injury superimposed on CKD (HCC)   Abdominal pain (epigastric and right upper quadrant) due to chronic cholecystitis -General surgery consulted and following, plans for cholecystectomy likely 01/16/2017.  Hold Plavix and aspirin for now  History of hypertension -Blood pressure controlled this morning 139/58, continue to hold hydrochlorothiazide  Acute kidney injury on chronic kidney disease stage III likely hemodynamically mediated -Creatinine improved to baseline with IV fluids.  Avoid nephrotoxins.  COPD -No wheezing, stable, albuterol as needed  Tobacco abuse -Nicotine patch  Hyperlipidemia  -Continue statin.   DVT prophylaxis: SCDs Code Status: Full code Family Communication: no family at bedside Disposition Plan: TBD  Consultants:   General surgery    Procedures:   None  Antimicrobials:  None    Subjective: - no chest pain, shortness of breath, no abdominal pain, nausea or vomiting.   Objective: Vitals:   01/14/17 1740 01/14/17 2102 01/15/17 0456 01/15/17 0800  BP: (!) 123/58 138/67 116/73 (!) 139/58  Pulse: 64  70 97  Resp: 18 18 18 18   Temp: 97.9 F (36.6 C) 97.8 F (36.6 C) 97.7 F (36.5 C) 97.8 F (36.6 C)  TempSrc: Oral Oral Oral Oral  SpO2: 95% 99% 100% 98%  Weight: 64.3 kg (141 lb 11.2 oz)     Height: 5\' 4"  (1.626 m)       Intake/Output Summary (Last 24 hours) at 01/15/17 1250 Last data filed at 01/15/17 1045  Gross per 24 hour  Intake           455.83 ml  Output              550 ml  Net           -94.17 ml   Filed Weights   01/14/17 1740  Weight: 64.3 kg (141 lb 11.2 oz)    Examination:  Vitals:   01/14/17 1740 01/14/17 2102 01/15/17 0456 01/15/17 0800  BP: (!) 123/58 138/67 116/73 (!) 139/58  Pulse: 64  70 97  Resp: 18 18 18 18   Temp: 97.9 F (36.6 C) 97.8 F (36.6 C) 97.7 F (36.5 C) 97.8 F (36.6 C)  TempSrc: Oral Oral Oral Oral  SpO2: 95% 99% 100% 98%  Weight: 64.3 kg (141 lb 11.2 oz)     Height: 5\' 4"  (1.626 m)       Constitutional: NAD Eyes: lids and conjunctivae normal Respiratory: clear to auscultation bilaterally, no wheezing, no crackles.  Cardiovascular:  Regular rate and rhythm, no murmurs / rubs / gallops. No LE edema. Abdomen: no tenderness. Bowel sounds positive.  Skin: no rashes, lesions, ulcers. No induration Neurologic: CN 2-12 grossly intact. Strength 5/5 in all 4.  Psychiatric: Normal judgment and insight. Alert and oriented x 3. Normal mood.    Data Reviewed: I have independently reviewed following labs and imaging studies  CBC:  Recent Labs Lab 01/11/17 0136 01/14/17 0629 01/14/17 0646 01/15/17 0524  WBC 6.4  --  7.6 5.5  NEUTROABS  --   --  5.1  --   HGB 13.5 15.0 13.5 12.5  HCT 40.7 44.0 40.8 38.2  MCV 94.9  --  93.8 95.7  PLT 207  --  188 381    Basic Metabolic Panel:  Recent Labs Lab 01/11/17 0136 01/14/17 0629 01/14/17 0646 01/15/17 0524  NA 138 137 137 142  K 4.9 3.9 3.9 4.3  CL 103 101 102 106  CO2 27  --  26 29  GLUCOSE 116* 104* 106* 104*  BUN 34* 37* 39* 29*  CREATININE 1.49* 1.90* 2.05* 1.61*  CALCIUM 9.6  --  9.1 9.3   GFR: Estimated Creatinine Clearance: 25.7 mL/min (A) (by C-G formula based on SCr of 1.61 mg/dL (H)). Liver Function Tests:  Recent Labs Lab 01/11/17 0136 01/14/17 0646 01/15/17 0524  AST 13* 14* 12*  ALT 11* 10* 10*  ALKPHOS 70 72 63  BILITOT 0.7 0.7 1.4*  PROT 6.9 6.9 6.3*  ALBUMIN 3.7 3.7 3.2*    Recent Labs Lab 01/11/17 0136 01/14/17 0646  LIPASE 22 22   No results for input(s): AMMONIA in the last 168 hours. Coagulation Profile:  Recent Labs Lab 01/15/17 0524  INR 1.01   Cardiac Enzymes: No results for input(s): CKTOTAL, CKMB, CKMBINDEX, TROPONINI in the last 168 hours. BNP (last 3 results) No results for input(s): PROBNP in the last 8760 hours. HbA1C: No results for input(s): HGBA1C in the last 72 hours. CBG: No results for input(s): GLUCAP in the last 168 hours. Lipid Profile: No results for input(s): CHOL, HDL, LDLCALC, TRIG, CHOLHDL, LDLDIRECT in the last 72 hours. Thyroid Function Tests: No results for input(s): TSH, T4TOTAL, FREET4, T3FREE, THYROIDAB in the last 72 hours. Anemia Panel: No results for input(s): VITAMINB12, FOLATE, FERRITIN, TIBC, IRON, RETICCTPCT in the last 72 hours. Urine analysis:    Component Value Date/Time   COLORURINE YELLOW 01/14/2017 0609   APPEARANCEUR CLOUDY (A) 01/14/2017 0609   LABSPEC 1.017 01/14/2017 0609   PHURINE 5.0 01/14/2017 0609   GLUCOSEU NEGATIVE 01/14/2017 0609   HGBUR SMALL (A) 01/14/2017 0609   BILIRUBINUR NEGATIVE 01/14/2017 0609   BILIRUBINUR NEG 10/10/2010 [place] 01/14/2017 0609   PROTEINUR NEGATIVE 01/14/2017 0609   UROBILINOGEN 0.2 03/06/2015 0938   NITRITE NEGATIVE 01/14/2017  0609   LEUKOCYTESUR LARGE (A) 01/14/2017 0609   Sepsis Labs: Invalid input(s): PROCALCITONIN, LACTICIDVEN  Recent Results (from the past 240 hour(s))  Urine culture     Status: Abnormal   Collection Time: 01/11/17  1:36 AM  Result Value Ref Range Status   Specimen Description URINE, CLEAN CATCH  Final   Special Requests NONE  Final   Culture MULTIPLE SPECIES PRESENT, SUGGEST RECOLLECTION (A)  Final   Report Status 01/12/2017 FINAL  Final      Radiology Studies: US Abdomen Complete  Result Date: 01/14/2017 CLINICAL DATA:  Abdominal pain for 1 week. History of cholelithiasis. EXAM: ABDOMEN ULTRASOUND COMPLETE COMPARISON:  01/11/2017 CT abdomen/ pelvis. FINDINGS: Gallbladder:  Shadowing 1.6 cm gallbladder neck gallstone. No significant gallbladder distention. Sludge in the gallbladder. Mild diffuse gallbladder wall thickening. No pericholecystic fluid or sonographic Murphy's sign. Common bile duct: Diameter: 4 mm Liver: No focal lesion identified. Within normal limits in parenchymal echogenicity. Portal vein is patent on color Doppler imaging with normal direction of blood flow towards the liver. IVC: No abnormality visualized. Pancreas: Not visualized due to overlying bowel gas. Spleen: Size and appearance within normal limits. Right Kidney: Length: 8.8 cm. Echogenicity within normal limits. No hydronephrosis. Simple 1.1 x 1.2 x 1.4 cm upper right renal cysts. Left Kidney: Length: 10.3 cm. Echogenicity within normal limits. No hydronephrosis. Simple 5.5 x 5.6 x 5.3 cm interpolar left renal cyst. Abdominal aorta: No aneurysm visualized. Other findings: None. IMPRESSION: 1. Cholelithiasis and gallbladder sludge. Mild diffuse gallbladder wall thickening. No pericholecystic fluid or sonographic Murphy's sign. 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 If there is clinical concern for acute cholecystitis, consider correlation with hepatobiliary  scintigraphy study. 2. No biliary ductal dilatation. 3. Simple bilateral renal cysts. Otherwise normal abdominal sonogram, with limitations as described. Electronically Signed   By: Ilona Sorrel M.D.   On: 01/14/2017 08:27     Scheduled Meds: . atorvastatin  40 mg Oral Daily  . cholecalciferol  1,000 Units Oral Daily  . nicotine  14 mg Transdermal Q24H  . pantoprazole (PROTONIX) IV  40 mg Intravenous Q24H  . prednisoLONE acetate  1 drop Both Eyes Daily   Continuous Infusions: . sodium chloride 50 mL/hr at 01/14/17 [place], MD, PhD [facility] Pager [phone]  If 7PM-7AM, please contact night-coverage [email or web address] Password [email or web address] 01/15/2017, 12:50 PM

## 2017-01-15 NOTE — Progress Notes (Signed)
[facility] Progress Note     Subjective: CC:  Denies abdominal pain. Tolerating  Clears without nausea or vomiting. Feels hospital admission and surgery were unexpected but is ready to have her gallbladder removed. Requesting to shower and requesting a pair of underwear.   Objective: Vital signs in last 24 hours: Temp:  [97.7 F (36.5 C)-98.2 F (36.8 C)] 97.7 F (36.5 C) (09/06 0456) Pulse Rate:  [60-70] 70 (09/06 0456) Resp:  [18-20] 18 (09/06 0456) BP: (116-143)/(55-75) 116/73 (09/06 0456) SpO2:  [91 %-100 %] 100 % (09/06 0456) Weight:  [64.3 kg (141 lb 11.2 oz)] 64.3 kg (141 lb 11.2 oz) (09/05 1740) Last BM Date: 01/14/17  Intake/Output from previous day: 09/05 0701 - 09/06 0700 In: 235.8 [P.O.:120; I.V.:115.8] Out: 200 [Urine:200] Intake/Output this shift: No intake/output data recorded.  PE: Gen:  Alert, NAD, pleasant HEENT: anicteric sclerae, normal conjunctivae no discharge Card:  Regular rate and rhythm, toes WWP Pulm:  Normal effort, clear to auscultation bilaterally Abd: Soft, mild TTP RUQ without guarding or peritonitis, non-distended, bowel sounds present in all 4 quadrants Skin: warm and dry, no rashes  Psych: A&Ox3   Lab Results:   Recent Labs  01/14/17 0646 01/15/17 0524  WBC 7.6 5.5  HGB 13.5 12.5  HCT 40.8 38.2  PLT 188 178   BMET  Recent Labs  01/14/17 0646 01/15/17 0524  NA 137 142  K 3.9 4.3  CL 102 106  CO2 26 29  GLUCOSE 106* 104*  BUN 39* 29*  CREATININE 2.05* 1.61*  CALCIUM 9.1 9.3   PT/INR  Recent Labs  01/15/17 0524  LABPROT 13.2  INR 1.01   CMP     Component Value Date/Time   NA 142 01/15/2017 0524   NA 142 08/19/2016 1129   K 4.3 01/15/2017 0524   CL 106 01/15/2017 0524   CO2 29 01/15/2017 0524   GLUCOSE 104 (H) 01/15/2017 0524   BUN 29 (H) 01/15/2017 0524   BUN 25 08/19/2016 1129   CREATININE 1.61 (H) 01/15/2017 0524   CREATININE 1.33 (H) 04/16/2016 1131   CALCIUM 9.3 01/15/2017 0524    PROT 6.3 (L) 01/15/2017 0524   PROT 6.6 08/15/2015 1114   ALBUMIN 3.2 (L) 01/15/2017 0524   ALBUMIN 4.0 08/15/2015 1114   AST 12 (L) 01/15/2017 0524   ALT 10 (L) 01/15/2017 0524   ALKPHOS 63 01/15/2017 0524   BILITOT 1.4 (H) 01/15/2017 0524   BILITOT 0.4 08/15/2015 1114   GFRNONAA 30 (L) 01/15/2017 0524   GFRNONAA 39 (L) 11/02/2014 0934   GFRAA 35 (L) 01/15/2017 0524   GFRAA 45 (L) 11/02/2014 0934   Lipase     Component Value Date/Time   LIPASE 22 01/14/2017 0646       Studies/Results: US Abdomen Complete  Result Date: 01/14/2017 CLINICAL DATA:  Abdominal pain for 1 week. History of cholelithiasis. EXAM: ABDOMEN ULTRASOUND COMPLETE COMPARISON:  01/11/2017 CT abdomen/ pelvis. FINDINGS: Gallbladder: Shadowing 1.6 cm gallbladder neck gallstone. No significant gallbladder distention. Sludge in the gallbladder. Mild diffuse gallbladder wall thickening. No pericholecystic fluid or sonographic Murphy's sign. Common bile duct: Diameter: 4 mm Liver: No focal lesion identified. Within normal limits in parenchymal echogenicity. Portal vein is patent on color Doppler imaging with normal direction of blood flow towards the liver. IVC: No abnormality visualized. Pancreas: Not visualized due to overlying bowel gas. Spleen: Size and appearance within normal limits. Right Kidney: Length: 8.8 cm. Echogenicity within normal limits. No hydronephrosis. Simple 1.1 x 1.2 x  1.4 cm upper right renal cysts. Left Kidney: Length: 10.3 cm. Echogenicity within normal limits. No hydronephrosis. Simple 5.5 x 5.6 x 5.3 cm interpolar left renal cyst. Abdominal aorta: No aneurysm visualized. Other findings: None. IMPRESSION: 1. Cholelithiasis and gallbladder sludge. Mild diffuse gallbladder wall thickening. No pericholecystic fluid or sonographic Murphy's sign. 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 If there is clinical concern for acute cholecystitis, consider  correlation with hepatobiliary scintigraphy study. 2. No biliary ductal dilatation. 3. Simple bilateral renal cysts. Otherwise normal abdominal sonogram, with limitations as described. Electronically Signed   By: Ilona Sorrel M.D.   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 No pericholecystic fluid or sonographic Murphy's sign. Findings are most compatible with chronic cholecystitis - WBC WNL, afebrile - LFTs are not elevated   Plan - NPO after MN. OR tomorrow vs Saturday for laparoscopic cholecystectomy with possible IOC   LOS: 1 day    Jill Alexanders , [facility] 01/15/2017, 7:54 AM Pager: [phone] Consults: [phone] Mon-Fri 7:00 am-4:30 pm Sat-Sun 7:00 am-11:30 am

## 2017-01-16 ENCOUNTER — Encounter (HOSPITAL_COMMUNITY): Payer: Self-pay | Admitting: Anesthesiology

## 2017-01-16 ENCOUNTER — Observation Stay (HOSPITAL_COMMUNITY): Payer: Medicare Other | Admitting: Anesthesiology

## 2017-01-16 ENCOUNTER — Encounter (HOSPITAL_COMMUNITY)
Admission: EM | Disposition: A | Discharge status: Home / Self Care | Payer: Self-pay | Source: Home / Self Care | Attending: Emergency Medicine

## 2017-01-16 ENCOUNTER — Observation Stay (HOSPITAL_COMMUNITY): Payer: Medicare Other

## 2017-01-16 DIAGNOSIS — J449 Chronic obstructive pulmonary disease, unspecified: Secondary | ICD-10-CM | POA: Diagnosis not present

## 2017-01-16 DIAGNOSIS — K811 Chronic cholecystitis: Secondary | ICD-10-CM | POA: Diagnosis not present

## 2017-01-16 DIAGNOSIS — K8010 Calculus of gallbladder with chronic cholecystitis without obstruction: Secondary | ICD-10-CM | POA: Diagnosis not present

## 2017-01-16 DIAGNOSIS — I739 Peripheral vascular disease, unspecified: Secondary | ICD-10-CM | POA: Diagnosis not present

## 2017-01-16 DIAGNOSIS — N183 Chronic kidney disease, stage 3 (moderate): Secondary | ICD-10-CM | POA: Diagnosis not present

## 2017-01-16 DIAGNOSIS — K8020 Calculus of gallbladder without cholecystitis without obstruction: Secondary | ICD-10-CM | POA: Diagnosis not present

## 2017-01-16 DIAGNOSIS — K829 Disease of gallbladder, unspecified: Secondary | ICD-10-CM | POA: Diagnosis not present

## 2017-01-16 DIAGNOSIS — N189 Chronic kidney disease, unspecified: Secondary | ICD-10-CM | POA: Diagnosis not present

## 2017-01-16 DIAGNOSIS — I129 Hypertensive chronic kidney disease with stage 1 through stage 4 chronic kidney disease, or unspecified chronic kidney disease: Secondary | ICD-10-CM | POA: Diagnosis not present

## 2017-01-16 DIAGNOSIS — N179 Acute kidney failure, unspecified: Secondary | ICD-10-CM | POA: Diagnosis not present

## 2017-01-16 HISTORY — PX: CHOLECYSTECTOMY: SHX55

## 2017-01-16 LAB — BASIC METABOLIC PANEL
ANION GAP: 6 (ref 5–15)
BUN: 20 mg/dL (ref 6–20)
CHLORIDE: 105 mmol/L (ref 101–111)
CO2: 29 mmol/L (ref 22–32)
Calcium: 9.3 mg/dL (ref 8.9–10.3)
Creatinine, Ser: 1.45 mg/dL — ABNORMAL HIGH (ref 0.44–1.00)
GFR calc non Af Amer: 34 mL/min — ABNORMAL LOW (ref >60)
GFR, EST AFRICAN AMERICAN: 39 mL/min — AB (ref >60)
Glucose, Bld: 94 mg/dL (ref 65–99)
POTASSIUM: 4.2 mmol/L (ref 3.5–5.1)
SODIUM: 140 mmol/L (ref 135–145)

## 2017-01-16 LAB — SURGICAL PCR SCREEN
MRSA, PCR: NEGATIVE
Staphylococcus aureus: NEGATIVE

## 2017-01-16 LAB — CBC
HEMATOCRIT: 37.6 % (ref 36.0–46.0)
HEMOGLOBIN: 12.3 g/dL (ref 12.0–15.0)
MCH: 31.4 pg (ref 26.0–34.0)
MCHC: 32.7 g/dL (ref 30.0–36.0)
MCV: 95.9 fL (ref 78.0–100.0)
Platelets: 206 10*3/uL (ref 150–400)
RBC: 3.92 MIL/uL (ref 3.87–5.11)
RDW: 13.8 % (ref 11.5–15.5)
WBC: 4.9 10*3/uL (ref 4.0–10.5)

## 2017-01-16 SURGERY — LAPAROSCOPIC CHOLECYSTECTOMY WITH INTRAOPERATIVE CHOLANGIOGRAM
Anesthesia: General | Site: Abdomen

## 2017-01-16 MED ORDER — PROPOFOL 10 MG/ML IV BOLUS
INTRAVENOUS | Status: DC | PRN
  Administered 2017-01-16: 130 mg via INTRAVENOUS

## 2017-01-16 MED ORDER — FENTANYL CITRATE (PF) 100 MCG/2ML IJ SOLN
INTRAMUSCULAR | Status: AC
  Filled 2017-01-16: qty 2

## 2017-01-16 MED ORDER — FENTANYL CITRATE (PF) 100 MCG/2ML IJ SOLN
INTRAMUSCULAR | Status: AC
  Filled 2017-01-16: qty 4

## 2017-01-16 MED ORDER — CEFAZOLIN SODIUM-DEXTROSE 2-4 GM/100ML-% IV SOLN
INTRAVENOUS | Status: AC
  Filled 2017-01-16: qty 100

## 2017-01-16 MED ORDER — DEXAMETHASONE SODIUM PHOSPHATE 10 MG/ML IJ SOLN
INTRAMUSCULAR | Status: AC
  Filled 2017-01-16: qty 1

## 2017-01-16 MED ORDER — LACTATED RINGERS IV SOLN
INTRAVENOUS | Status: DC | PRN
Start: 2017-01-16 — End: 2017-01-16
  Administered 2017-01-16 (×2): via INTRAVENOUS
  Administered 2017-01-16: 1000 mL via INTRAVENOUS

## 2017-01-16 MED ORDER — BUPIVACAINE-EPINEPHRINE (PF) 0.25% -1:200000 IJ SOLN
INTRAMUSCULAR | Status: AC
  Filled 2017-01-16: qty 30

## 2017-01-16 MED ORDER — ONDANSETRON HCL 4 MG/2ML IJ SOLN: 4.0000 mg | Freq: Once | INTRAMUSCULAR | Status: DC | PRN

## 2017-01-16 MED ORDER — EPHEDRINE 5 MG/ML INJ
INTRAVENOUS | Status: AC
  Filled 2017-01-16: qty 10

## 2017-01-16 MED ORDER — METOCLOPRAMIDE HCL 5 MG/ML IJ SOLN
INTRAMUSCULAR | Status: AC
  Filled 2017-01-16: qty 2

## 2017-01-16 MED ORDER — IOPAMIDOL (ISOVUE-300) INJECTION 61%
INTRAVENOUS | Status: AC
  Filled 2017-01-16: qty 50

## 2017-01-16 MED ORDER — ONDANSETRON HCL 4 MG/2ML IJ SOLN
INTRAMUSCULAR | Status: AC
  Filled 2017-01-16: qty 2

## 2017-01-16 MED ORDER — LIDOCAINE 2% (20 MG/ML) 5 ML SYRINGE
INTRAMUSCULAR | Status: AC
  Filled 2017-01-16: qty 10

## 2017-01-16 MED ORDER — OXYCODONE HCL 5 MG PO TABS
5.0000 mg | ORAL_TABLET | Freq: Once | ORAL | Status: AC
  Administered 2017-01-16: 5 mg via ORAL
  Filled 2017-01-16: qty 1

## 2017-01-16 MED ORDER — DEXAMETHASONE SODIUM PHOSPHATE 10 MG/ML IJ SOLN
INTRAMUSCULAR | Status: DC | PRN
  Administered 2017-01-16: 10 mg via INTRAVENOUS

## 2017-01-16 MED ORDER — METOCLOPRAMIDE HCL 5 MG/ML IJ SOLN
INTRAMUSCULAR | Status: DC | PRN
  Administered 2017-01-16: 10 mg via INTRAVENOUS

## 2017-01-16 MED ORDER — FENTANYL CITRATE (PF) 100 MCG/2ML IJ SOLN
INTRAMUSCULAR | Status: DC | PRN
  Administered 2017-01-16: 25 ug via INTRAVENOUS
  Administered 2017-01-16: 50 ug via INTRAVENOUS
  Administered 2017-01-16: 25 ug via INTRAVENOUS

## 2017-01-16 MED ORDER — EPHEDRINE SULFATE-NACL 50-0.9 MG/10ML-% IV SOSY
PREFILLED_SYRINGE | INTRAVENOUS | Status: DC | PRN
  Administered 2017-01-16: 10 mg via INTRAVENOUS
  Administered 2017-01-16: 5 mg via INTRAVENOUS

## 2017-01-16 MED ORDER — ROCURONIUM BROMIDE 10 MG/ML (PF) SYRINGE
PREFILLED_SYRINGE | INTRAVENOUS | Status: DC | PRN
  Administered 2017-01-16: 35 mg via INTRAVENOUS

## 2017-01-16 MED ORDER — ALBUTEROL SULFATE HFA 108 (90 BASE) MCG/ACT IN AERS
INHALATION_SPRAY | RESPIRATORY_TRACT | Status: AC
  Filled 2017-01-16: qty 6.7

## 2017-01-16 MED ORDER — LIDOCAINE 2% (20 MG/ML) 5 ML SYRINGE
INTRAMUSCULAR | Status: AC
  Filled 2017-01-16: qty 5

## 2017-01-16 MED ORDER — IOPAMIDOL (ISOVUE-300) INJECTION 61%
INTRAVENOUS | Status: DC | PRN
  Administered 2017-01-16: 50 mL via INTRAVENOUS

## 2017-01-16 MED ORDER — ALBUTEROL SULFATE HFA 108 (90 BASE) MCG/ACT IN AERS
INHALATION_SPRAY | RESPIRATORY_TRACT | Status: DC | PRN
  Administered 2017-01-16: 4 via RESPIRATORY_TRACT
  Administered 2017-01-16: 2 via RESPIRATORY_TRACT

## 2017-01-16 MED ORDER — ROCURONIUM BROMIDE 50 MG/5ML IV SOSY
PREFILLED_SYRINGE | INTRAVENOUS | Status: AC
  Filled 2017-01-16: qty 5

## 2017-01-16 MED ORDER — CEFAZOLIN SODIUM-DEXTROSE 2-4 GM/100ML-% IV SOLN
2.0000 g | Freq: Once | INTRAVENOUS | Status: AC
  Administered 2017-01-16: 2 g via INTRAVENOUS

## 2017-01-16 MED ORDER — LACTATED RINGERS IR SOLN
Status: DC | PRN
  Administered 2017-01-16: 1000 mL

## 2017-01-16 MED ORDER — SUGAMMADEX SODIUM 200 MG/2ML IV SOLN
INTRAVENOUS | Status: DC | PRN
  Administered 2017-01-16: 200 mg via INTRAVENOUS

## 2017-01-16 MED ORDER — SUGAMMADEX SODIUM 200 MG/2ML IV SOLN
INTRAVENOUS | Status: AC
  Filled 2017-01-16: qty 2

## 2017-01-16 MED ORDER — FENTANYL CITRATE (PF) 100 MCG/2ML IJ SOLN
25.0000 ug | INTRAMUSCULAR | Status: DC | PRN
  Administered 2017-01-16 (×2): 50 ug via INTRAVENOUS

## 2017-01-16 MED ORDER — PROPOFOL 10 MG/ML IV BOLUS
INTRAVENOUS | Status: AC
  Filled 2017-01-16: qty 20

## 2017-01-16 MED ORDER — LIDOCAINE 2% (20 MG/ML) 5 ML SYRINGE
INTRAMUSCULAR | Status: DC | PRN
  Administered 2017-01-16: 80 mg via INTRAVENOUS

## 2017-01-16 MED ORDER — 0.9 % SODIUM CHLORIDE (POUR BTL) OPTIME
TOPICAL | Status: DC | PRN
  Administered 2017-01-16: 1000 mL

## 2017-01-16 MED ORDER — BUPIVACAINE-EPINEPHRINE 0.25% -1:200000 IJ SOLN
INTRAMUSCULAR | Status: DC | PRN
  Administered 2017-01-16: 24 mL

## 2017-01-16 SURGICAL SUPPLY — 30 items
ADH SKN CLS APL DERMABOND .7 (GAUZE/BANDAGES/DRESSINGS) ×1
APPLIER CLIP 5 13 M/L LIGAMAX5 (MISCELLANEOUS) ×3
APR CLP MED LRG 5 ANG JAW (MISCELLANEOUS) ×1
BAG SPEC RTRVL LRG 6X4 10 (ENDOMECHANICALS) ×1
CABLE HIGH FREQUENCY MONO STRZ (ELECTRODE) ×3 IMPLANT
CATH REDDICK CHOLANGI 4FR 50CM (CATHETERS) ×3 IMPLANT
CHLORAPREP W/TINT 26ML (MISCELLANEOUS) ×3 IMPLANT
CLIP APPLIE 5 13 M/L LIGAMAX5 (MISCELLANEOUS) ×1 IMPLANT
COVER MAYO STAND STRL (DRAPES) ×3 IMPLANT
DECANTER SPIKE VIAL GLASS SM (MISCELLANEOUS) ×1 IMPLANT
DERMABOND ADVANCED (GAUZE/BANDAGES/DRESSINGS) ×2
DERMABOND ADVANCED .7 DNX12 (GAUZE/BANDAGES/DRESSINGS) ×1 IMPLANT
DRAPE C-ARM 42X120 X-RAY (DRAPES) ×3 IMPLANT
ELECT REM PT RETURN 15FT ADLT (MISCELLANEOUS) ×3 IMPLANT
GLOVE BIO SURGEON STRL SZ7.5 (GLOVE) ×3 IMPLANT
GOWN STRL REUS W/TWL XL LVL3 (GOWN DISPOSABLE) ×9 IMPLANT
HEMOSTAT SURGICEL 4X8 (HEMOSTASIS) IMPLANT
IV CATH 14GX2 1/4 (CATHETERS) ×3 IMPLANT
KIT BASIN OR (CUSTOM PROCEDURE TRAY) ×3 IMPLANT
POUCH SPECIMEN RETRIEVAL 10MM (ENDOMECHANICALS) ×3 IMPLANT
SCISSORS LAP 5X35 DISP (ENDOMECHANICALS) ×3 IMPLANT
SET IRRIG TUBING LAPAROSCOPIC (IRRIGATION / IRRIGATOR) ×3 IMPLANT
SLEEVE XCEL OPT CAN 5 100 (ENDOMECHANICALS) ×6 IMPLANT
SUT MNCRL AB 4-0 PS2 18 (SUTURE) ×3 IMPLANT
TOWEL OR 17X26 10 PK STRL BLUE (TOWEL DISPOSABLE) ×3 IMPLANT
TOWEL OR NON WOVEN STRL DISP B (DISPOSABLE) ×3 IMPLANT
TRAY LAPAROSCOPIC (CUSTOM PROCEDURE TRAY) ×3 IMPLANT
TROCAR BLADELESS OPT 5 100 (ENDOMECHANICALS) ×3 IMPLANT
TROCAR XCEL BLUNT TIP 100MML (ENDOMECHANICALS) ×3 IMPLANT
TUBING INSUF HEATED (TUBING) ×3 IMPLANT

## 2017-01-16 NOTE — Op Note (Signed)
01/14/2017 - 01/16/2017  10:37 AM  PATIENT:  [name]  76 y.o. female  PRE-OPERATIVE DIAGNOSIS:  chronic cholecystitis  POST-OPERATIVE DIAGNOSIS:  chronic cholecystitis  PROCEDURE:  Procedure(s): LAPAROSCOPIC CHOLECYSTECTOMY WITH  INTRAOPERATIVE CHOLANGIOGRAM (N/A)  SURGEON:  Surgeon(s) and Role:    * Jovita Kussmaul, MD - Primary  PHYSICIAN ASSISTANT:   ASSISTANTS: none   ANESTHESIA:   local and general  EBL:  Total I/O In: 1000 [I.V.:1000] Out: 25 [Blood:25]  BLOOD ADMINISTERED:none  DRAINS: none   LOCAL MEDICATIONS USED:  MARCAINE     SPECIMEN:  Source of Specimen:  gallbladder  DISPOSITION OF SPECIMEN:  PATHOLOGY  COUNTS:  YES  TOURNIQUET:  * No tourniquets in log *  DICTATION: .Dragon Dictation   Procedure: After informed consent was obtained the patient was brought to the operating room and placed in the supine position on the operating room table. After adequate induction of general anesthesia the patient's abdomen was prepped with ChloraPrep allowed to dry and draped in usual sterile manner. An appropriate timeout was performedThe area below the umbilicus was infiltrated with quarter percent  Marcaine. A small incision was made with a 15 blade knife. The incision was carried down through the subcutaneous tissue bluntly with a hemostat and [employer] retractors. The linea alba was identified. The linea alba was incised with a 15 blade knife and each side was grasped with Coker clamps. The preperitoneal space was then probed with a hemostat until the peritoneum was opened and access was gained to the abdominal cavity. A 0 Vicryl pursestring stitch was placed in the fascia surrounding the opening. A Hassan cannula was then placed through the opening and anchored in place with the previously placed Vicryl purse string stitch. The abdomen was insufflated with carbon dioxide without difficulty. A laparoscope was inserted through the [facility] cannula in the right upper  quadrant was inspected. Next the epigastric region was infiltrated with % Marcaine. A small incision was made with a 15 blade knife. A 5 mm port was placed bluntly through this incision into the abdominal cavity under direct vision. Next 2 sites were chosen laterally on the right side of the abdomen for placement of 5 mm ports. Each of these areas was infiltrated with quarter percent Marcaine. Small stab incisions were made with a 15 blade knife. 5 mm ports were then placed bluntly through these incisions into the abdominal cavity under direct vision without difficulty. A blunt grasper was placed through the lateralmost 5 mm port and used to grasp the dome of the gallbladder and elevated anteriorly and superiorly. Another blunt grasper was placed through the other 5 mm port and used to retract the body and neck of the gallbladder. A dissector was placed through the epigastric port and using the electrocautery the peritoneal reflection at the gallbladder neck was opened. Blunt dissection was then carried out in this area until the gallbladder neck-cystic duct junction was readily identified and a good window was created. A single clip was placed on the gallbladder neck. A small  ductotomy was made just below the clip with laparoscopic scissors. A 14-gauge Angiocath was then placed through the anterior abdominal wall under direct vision. A Reddick cholangiogram catheter was then placed through the Angiocath and flushed. The catheter was then placed in the cystic duct and anchored in place with a clip. A cholangiogram was obtained that showed no filling defects good emptying into the duodenum an adequate length on the cystic duct. The anchoring clip and catheters were  then removed from the patient. 3 clips were placed proximally on the cystic duct and the duct was divided between the 2 sets of clips. Posterior to this the cystic artery was identified and again dissected bluntly in a circumferential manner until a good  window  was created. 2 clips were placed proximally and one distally on the artery and the artery was divided between the 2 sets of clips. Next a laparoscopic hook cautery device was used to separate the gallbladder from the liver bed. Prior to completely detaching the gallbladder from the liver bed the liver bed was inspected and several small bleeding points were coagulated with the electrocautery until the area was completely hemostatic. The gallbladder was then detached the rest of it from the liver bed without difficulty. A laparoscopic bag was inserted through the hassan port. The laparoscope was moved to the epigastric port. The gallbladder was placed within the bag and the bag was sealed.  The bag with the gallbladder was then removed with the [facility] cannula through the infraumbilical port without difficulty. The fascial defect was then closed with the previously placed Vicryl pursestring stitch as well as with another figure-of-eight 0 Vicryl stitch. The liver bed was inspected again and found to be hemostatic. The abdomen was irrigated with copious amounts of saline until the effluent was clear. The ports were then removed under direct vision without difficulty and were found to be hemostatic. The gas was allowed to escape. The skin incisions were all closed with interrupted 4-0 Monocryl subcuticular stitches. Dermabond dressings were applied. The patient tolerated the procedure well. At the end of the case all needle sponge and instrument counts were correct. The patient was then awakened and taken to recovery in stable condition  PLAN OF CARE: Admit to inpatient   PATIENT DISPOSITION:  PACU - hemodynamically stable.   Delay start of Pharmacological VTE agent (>24hrs) due to surgical blood loss or risk of bleeding: no

## 2017-01-16 NOTE — Transfer of Care (Signed)
Immediate Anesthesia Transfer of Care Note  Patient: [name]  Procedure(s) Performed: Procedure(s): LAPAROSCOPIC CHOLECYSTECTOMY WITH  INTRAOPERATIVE CHOLANGIOGRAM (N/A)  Patient Location: PACU  Anesthesia Type:General  Level of Consciousness:  sedated, patient cooperative and responds to stimulation  Airway & Oxygen Therapy:Patient Spontanous Breathing and Patient connected to face mask oxgen  Post-op Assessment:  Report given to PACU RN and Post -op Vital signs reviewed and stable  Post vital signs:  Reviewed and stable  Last Vitals:  Vitals:   01/15/17 2107 01/16/17 0552  BP: 116/63 136/73  Pulse: 71 65  Resp: 18 18  Temp: 37.1 C (!) 36.4 C  SpO2: 48% 185%    Complications: No apparent anesthesia complications

## 2017-01-16 NOTE — Progress Notes (Signed)
PROGRESS NOTE  [name] IWP:[id number] DOB: 12/09/1940 DOA: 01/14/2017 PCP: Sid Falcon, MD   LOS: 1 day   Brief Narrative / Interim history: [name] is a 76 y.o. female with medical history significant of hypertension, hyperlipidemia, peripheral vascular disease on aspirin and Plavix, active smoker, COPD, DVT, CKD presented with nausea vomiting and abdominal pain. Patient was seen in the ED on 9/2 for similar problem when patient clinically improved with GI cocktail. Patient was discharged with Keflex for possible UTI. The urine culture grew multipleis likely contaminant. Patient now presented with epigastric pain and right upper quadrant pain. Denies nausea vomiting, fever, chills, chest pain, shortness of breath. She was diagnosed with chronic cholecystitis and has been admitted. Surgery was consulted.  Assessment & Plan: Active Problems:   TOBACCO ABUSE   CKD (chronic kidney disease) stage 3, GFR 30-59 ml/min   COPD (chronic obstructive pulmonary disease) (HCC)   Pain in the abdomen   PVD (peripheral vascular disease) s/p LLE PTA 03/2016, RLE PTA 04/2016   Cholecystitis   Acute kidney injury superimposed on CKD (HCC)    Abdominal pain (epigastric and right upper quadrant) due to chronic cholecystitis -General surgery consulted and following, plans for cholecystectomy today.  Seen preop, no complaints, awaiting surgery.  History of hypertension -Blood pressure controlled this morning 114/46, continue to hold hydrochlorothiazide  Acute kidney injury on chronic kidney disease stage III likely hemodynamically mediated -Creatinine improved to baseline with IV fluids 2/05 >>1.61 >> 1.45  COPD -No wheezing, stable, albuterol as needed  Tobacco abuse -Nicotine patch  Hyperlipidemia  -Continue statin.   DVT prophylaxis: SCDs Code Status: Full code Family Communication: no family at bedside Disposition Plan: TBD  Consultants:   General surgery    Procedures:   None  Antimicrobials:  None    Subjective: -No complaints, awaiting surgery.  Objective: Vitals:   01/16/17 1048 01/16/17 1100 01/16/17 1115 01/16/17 1130  BP:  (!) 117/44 (!) 108/49 (!) 114/46  Pulse:  63 74 63  Resp:  12 10 12   Temp: (!) 97.5 F (36.4 C)  97.6 F (36.4 C) 97.6 F (36.4 C)  TempSrc:      SpO2:  100% 98% 100%  Weight:      Height:        Intake/Output Summary (Last 24 hours) at 01/16/17 1214 Last data filed at 01/16/17 1049  Gross per 24 hour  Intake          3405.67 ml  Output             1427 ml  Net          1978.67 ml   Filed Weights   01/14/17 1740  Weight: 64.3 kg (141 lb 11.2 oz)    Examination:  Vitals:   01/16/17 1048 01/16/17 1100 01/16/17 1115 01/16/17 1130  BP:  (!) 117/44 (!) 108/49 (!) 114/46  Pulse:  63 74 63  Resp:  12 10 12   Temp: (!) 97.5 F (36.4 C)  97.6 F (36.4 C) 97.6 F (36.4 C)  TempSrc:      SpO2:  100% 98% 100%  Weight:      Height:        Constitutional: NAD Respiratory: CTA Cardiovascular: RRR   Data Reviewed: I have independently reviewed following labs and imaging studies  CBC:  Recent Labs Lab 01/11/17 0136 01/14/17 0629 01/14/17 0646 01/15/17 0524 01/16/17 0444  WBC 6.4  --  7.6 5.5 4.9  NEUTROABS  --   --  5.1  --   --   HGB 13.5 15.0 13.5 12.5 12.3  HCT 40.7 44.0 40.8 38.2 37.6  MCV 94.9  --  93.8 95.7 95.9  PLT 207  --  188 178 161   Basic Metabolic Panel:  Recent Labs Lab 01/11/17 0136 01/14/17 0629 01/14/17 0646 01/15/17 0524 01/16/17 0444  NA 138 137 137 142 140  K 4.9 3.9 3.9 4.3 4.2  CL 103 101 102 106 105  CO2 27  --  26 29 29   GLUCOSE 116* 104* 106* 104* 94  BUN 34* 37* 39* 29* 20  CREATININE 1.49* 1.90* 2.05* 1.61* 1.45*  CALCIUM 9.6  --  9.1 9.3 9.3   GFR: Estimated Creatinine Clearance: 28.5 mL/min (A) (by C-G formula based on SCr of 1.45 mg/dL (H)). Liver Function Tests:  Recent Labs Lab 01/11/17 0136 01/14/17 0646 01/15/17 0524   AST 13* 14* 12*  ALT 11* 10* 10*  ALKPHOS 70 72 63  BILITOT 0.7 0.7 1.4*  PROT 6.9 6.9 6.3*  ALBUMIN 3.7 3.7 3.2*    Recent Labs Lab 01/11/17 0136 01/14/17 0646  LIPASE 22 22   No results for input(s): AMMONIA in the last 168 hours. Coagulation Profile:  Recent Labs Lab 01/15/17 0524  INR 1.01   Cardiac Enzymes: No results for input(s): CKTOTAL, CKMB, CKMBINDEX, TROPONINI in the last 168 hours. BNP (last 3 results) No results for input(s): PROBNP in the last 8760 hours. HbA1C: No results for input(s): HGBA1C in the last 72 hours. CBG: No results for input(s): GLUCAP in the last 168 hours. Lipid Profile: No results for input(s): CHOL, HDL, LDLCALC, TRIG, CHOLHDL, LDLDIRECT in the last 72 hours. Thyroid Function Tests: No results for input(s): TSH, T4TOTAL, FREET4, T3FREE, THYROIDAB in the last 72 hours. Anemia Panel: No results for input(s): VITAMINB12, FOLATE, FERRITIN, TIBC, IRON, RETICCTPCT in the last 72 hours. Urine analysis:    Component Value Date/Time   COLORURINE YELLOW 01/14/2017 0609   APPEARANCEUR CLOUDY (A) 01/14/2017 0609   LABSPEC 1.017 01/14/2017 0609   PHURINE 5.0 01/14/2017 0609   GLUCOSEU NEGATIVE 01/14/2017 0609   HGBUR SMALL (A) 01/14/2017 0609   BILIRUBINUR NEGATIVE 01/14/2017 0609   BILIRUBINUR NEG 10/10/2010 Zimmerman 01/14/2017 0609   PROTEINUR NEGATIVE 01/14/2017 0609   UROBILINOGEN 0.2 03/06/2015 0938   NITRITE NEGATIVE 01/14/2017 0609   LEUKOCYTESUR LARGE (A) 01/14/2017 0609   Sepsis Labs: Invalid input(s): PROCALCITONIN, LACTICIDVEN  Recent Results (from the past 240 hour(s))  Urine culture     Status: Abnormal   Collection Time: 01/11/17  1:36 AM  Result Value Ref Range Status   Specimen Description URINE, CLEAN CATCH  Final   Special Requests NONE  Final   Culture MULTIPLE SPECIES PRESENT, SUGGEST RECOLLECTION (A)  Final   Report Status 01/12/2017 FINAL  Final  Surgical pcr screen     Status: None    Collection Time: 01/16/17  6:44 AM  Result Value Ref Range Status   MRSA, PCR NEGATIVE NEGATIVE Final   Staphylococcus aureus NEGATIVE NEGATIVE Final    Comment: (NOTE) The Xpert SA Assay (FDA approved for NASAL specimens in patients 65 years of age and older), is one component of a comprehensive surveillance program. It is not intended to diagnose infection nor to guide or monitor treatment.       Radiology Studies: Dg Cholangiogram Operative  Result Date: 01/16/2017 CLINICAL DATA:  Cholelithiasis EXAM: INTRAOPERATIVE CHOLANGIOGRAM TECHNIQUE: Cholangiographic images from the C-arm fluoroscopic device were submitted for  interpretation post-operatively. Please see the procedural report for the amount of contrast and the fluoroscopy time utilized. COMPARISON:  01/14/2017 FINDINGS: The residual cystic duct, common hepatic duct, and common bile duct are patent. Contrast drains into the duodenum. Contrast also reflux into the pancreatic duct. Mixing artifact or small air bubble suspected in the proximal common hepatic duct. No biliary stricture or obstruction. IMPRESSION: Patent biliary system. Electronically Signed   By: Jerilynn Mages.  Shick M.D.   On: 01/16/2017 10:28    Scheduled Meds: . atorvastatin  40 mg Oral Daily  . cholecalciferol  1,000 Units Oral Daily  . fentaNYL      . nicotine  14 mg Transdermal Q24H  . pantoprazole (PROTONIX) IV  40 mg Intravenous Q24H  . prednisoLONE acetate  1 drop Both Eyes Daily   Continuous Infusions: . sodium chloride 50 mL/hr at 01/15/17 1430    Marzetta Board, MD, PhD [facility] Pager [phone]  If 7PM-7AM, please contact night-coverage [email or web address] Password [email or web address] 01/16/2017, 12:14 PM

## 2017-01-16 NOTE — Interval H&P Note (Signed)
History and Physical Interval Note:  01/16/2017 9:07 AM  [name]  has presented today for surgery, with the diagnosis of chronic cholecystitis  The various methods of treatment have been discussed with the patient and family. After consideration of risks, benefits and other options for treatment, the patient has consented to  Procedure(s): LAPAROSCOPIC CHOLECYSTECTOMY WITH POSSIBLE INTRAOPERATIVE CHOLANGIOGRAM (N/A) as a surgical intervention .  [name] history has been reviewed, patient examined, no change in status, stable for surgery.  I have reviewed the patient's chart and labs.  Questions were answered to the patient's satisfaction.     TOTH III,Ouida Abeyta S

## 2017-01-16 NOTE — Anesthesia Procedure Notes (Signed)
Procedure Name: Intubation Date/Time: 01/16/2017 9:30 AM Performed by: Jeffie Widdowson, Virgel Gess Pre-anesthesia Checklist: Patient identified, Emergency Drugs available, Suction available, Patient being monitored and Timeout performed Patient Re-evaluated:Patient Re-evaluated prior to induction Oxygen Delivery Method: Circle system utilized Preoxygenation: Pre-oxygenation with 100% oxygen Induction Type: IV induction Ventilation: Mask ventilation without difficulty Laryngoscope Size: Mac and 4 Grade View: Grade I Tube type: Oral Tube size: 7.0 mm Number of attempts: 1 Airway Equipment and Method: Stylet Placement Confirmation: ETT inserted through vocal cords under direct vision,  positive ETCO2,  CO2 detector and breath sounds checked- equal and bilateral Secured at: 21 cm Tube secured with: Tape Dental Injury: Teeth and Oropharynx as per pre-operative assessment

## 2017-01-16 NOTE — H&P (View-Only) (Signed)
[facility] Progress Note     Subjective: CC:  Denies abdominal pain. Tolerating  Clears without nausea or vomiting. Feels hospital admission and surgery were unexpected but is ready to have her gallbladder removed. Requesting to shower and requesting a pair of underwear.   Objective: Vital signs in last 24 hours: Temp:  [97.7 F (36.5 C)-98.2 F (36.8 C)] 97.7 F (36.5 C) (09/06 0456) Pulse Rate:  [60-70] 70 (09/06 0456) Resp:  [18-20] 18 (09/06 0456) BP: (116-143)/(55-75) 116/73 (09/06 0456) SpO2:  [91 %-100 %] 100 % (09/06 0456) Weight:  [64.3 kg (141 lb 11.2 oz)] 64.3 kg (141 lb 11.2 oz) (09/05 1740) Last BM Date: 01/14/17  Intake/Output from previous day: 09/05 0701 - 09/06 0700 In: 235.8 [P.O.:120; I.V.:115.8] Out: 200 [Urine:200] Intake/Output this shift: No intake/output data recorded.  PE: Gen:  Alert, NAD, pleasant HEENT: anicteric sclerae, normal conjunctivae no discharge Card:  Regular rate and rhythm, toes WWP Pulm:  Normal effort, clear to auscultation bilaterally Abd: Soft, mild TTP RUQ without guarding or peritonitis, non-distended, bowel sounds present in all 4 quadrants Skin: warm and dry, no rashes  Psych: A&Ox3   Lab Results:   Recent Labs  01/14/17 0646 01/15/17 0524  WBC 7.6 5.5  HGB 13.5 12.5  HCT 40.8 38.2  PLT 188 178   BMET  Recent Labs  01/14/17 0646 01/15/17 0524  NA 137 142  K 3.9 4.3  CL 102 106  CO2 26 29  GLUCOSE 106* 104*  BUN 39* 29*  CREATININE 2.05* 1.61*  CALCIUM 9.1 9.3   PT/INR  Recent Labs  01/15/17 0524  LABPROT 13.2  INR 1.01   CMP     Component Value Date/Time   NA 142 01/15/2017 0524   NA 142 08/19/2016 1129   K 4.3 01/15/2017 0524   CL 106 01/15/2017 0524   CO2 29 01/15/2017 0524   GLUCOSE 104 (H) 01/15/2017 0524   BUN 29 (H) 01/15/2017 0524   BUN 25 08/19/2016 1129   CREATININE 1.61 (H) 01/15/2017 0524   CREATININE 1.33 (H) 04/16/2016 1131   CALCIUM 9.3 01/15/2017 0524    PROT 6.3 (L) 01/15/2017 0524   PROT 6.6 08/15/2015 1114   ALBUMIN 3.2 (L) 01/15/2017 0524   ALBUMIN 4.0 08/15/2015 1114   AST 12 (L) 01/15/2017 0524   ALT 10 (L) 01/15/2017 0524   ALKPHOS 63 01/15/2017 0524   BILITOT 1.4 (H) 01/15/2017 0524   BILITOT 0.4 08/15/2015 1114   GFRNONAA 30 (L) 01/15/2017 0524   GFRNONAA 39 (L) 11/02/2014 0934   GFRAA 35 (L) 01/15/2017 0524   GFRAA 45 (L) 11/02/2014 0934   Lipase     Component Value Date/Time   LIPASE 22 01/14/2017 0646       Studies/Results: US Abdomen Complete  Result Date: 01/14/2017 CLINICAL DATA:  Abdominal pain for 1 week. History of cholelithiasis. EXAM: ABDOMEN ULTRASOUND COMPLETE COMPARISON:  01/11/2017 CT abdomen/ pelvis. FINDINGS: Gallbladder: Shadowing 1.6 cm gallbladder neck gallstone. No significant gallbladder distention. Sludge in the gallbladder. Mild diffuse gallbladder wall thickening. No pericholecystic fluid or sonographic Murphy's sign. Common bile duct: Diameter: 4 mm Liver: No focal lesion identified. Within normal limits in parenchymal echogenicity. Portal vein is patent on color Doppler imaging with normal direction of blood flow towards the liver. IVC: No abnormality visualized. Pancreas: Not visualized due to overlying bowel gas. Spleen: Size and appearance within normal limits. Right Kidney: Length: 8.8 cm. Echogenicity within normal limits. No hydronephrosis. Simple 1.1 x 1.2 x  1.4 cm upper right renal cysts. Left Kidney: Length: 10.3 cm. Echogenicity within normal limits. No hydronephrosis. Simple 5.5 x 5.6 x 5.3 cm interpolar left renal cyst. Abdominal aorta: No aneurysm visualized. Other findings: None. IMPRESSION: 1. Cholelithiasis and gallbladder sludge. Mild diffuse gallbladder wall thickening. No pericholecystic fluid or sonographic Murphy's sign. 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 If there is clinical concern for acute cholecystitis, consider  correlation with hepatobiliary scintigraphy study. 2. No biliary ductal dilatation. 3. Simple bilateral renal cysts. Otherwise normal abdominal sonogram, with limitations as described. Electronically Signed   By: Ilona Sorrel M.D.   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 No pericholecystic fluid or sonographic Murphy's sign. Findings are most compatible with chronic cholecystitis - WBC WNL, afebrile - LFTs are not elevated   Plan - NPO after MN. OR tomorrow vs Saturday for laparoscopic cholecystectomy with possible IOC   LOS: 1 day    Jill Alexanders , [facility] 01/15/2017, 7:54 AM Pager: [phone] Consults: [phone] Mon-Fri 7:00 am-4:30 pm Sat-Sun 7:00 am-11:30 am

## 2017-01-16 NOTE — Anesthesia Preprocedure Evaluation (Addendum)
Anesthesia Evaluation  Patient identified by MRN, date of birth, ID band Patient awake    Reviewed: Allergy & Precautions, NPO status , Patient's Chart, lab work & pertinent test results  Airway Mallampati: III  TM Distance: >3 FB Neck ROM: Full    Dental  (+) Edentulous Upper, Edentulous Lower   Pulmonary COPD, Current Smoker,    Pulmonary exam normal breath sounds clear to auscultation       Cardiovascular hypertension, + Peripheral Vascular Disease  Normal cardiovascular exam Rhythm:Regular Rate:Normal     Neuro/Psych negative neurological ROS  negative psych ROS   GI/Hepatic negative GI ROS, Neg liver ROS,   Endo/Other  negative endocrine ROS  Renal/GU negative Renal ROS  negative genitourinary   Musculoskeletal  (+) Arthritis ,   Abdominal   Peds  Hematology negative hematology ROS (+)   Anesthesia Other Findings   Reproductive/Obstetrics                           Anesthesia Physical Anesthesia Plan  ASA: III  Anesthesia Plan: General   Post-op Pain Management:    Induction: Intravenous  PONV Risk Score and Plan: 4 or greater and Ondansetron, Dexamethasone, Propofol infusion and Treatment may vary due to age or medical condition  Airway Management Planned: Oral ETT  Additional Equipment: None  Intra-op Plan:   Post-operative Plan: Extubation in OR  Informed Consent: I have reviewed the patients History and Physical, chart, labs and discussed the procedure including the risks, benefits and alternatives for the proposed anesthesia with the patient or authorized representative who has indicated his/her understanding and acceptance.   Dental advisory given  Plan Discussed with: CRNA  Anesthesia Plan Comments:        Anesthesia Quick Evaluation

## 2017-01-16 NOTE — Care Management CC44 (Signed)
Condition Code 44 Documentation Completed  Patient Details  Name: [name] MRN: [id number] Date of Birth: 1940/06/15   Condition Code 44 given:  Yes Patient signature on Condition Code 44 notice:  Yes Documentation of 2 MD's agreement:  Yes Code 44 added to claim:  Yes    Lynnell Catalan, RN 01/16/2017, 1:02 PM

## 2017-01-16 NOTE — Anesthesia Postprocedure Evaluation (Signed)
Anesthesia Post Note  Patient: [name]  Procedure(s) Performed: Procedure(s) (LRB): LAPAROSCOPIC CHOLECYSTECTOMY WITH  INTRAOPERATIVE CHOLANGIOGRAM (N/A)     Patient location during evaluation: PACU Anesthesia Type: General Level of consciousness: awake and alert Pain management: pain level controlled Vital Signs Assessment: post-procedure vital signs reviewed and stable Respiratory status: spontaneous breathing, nonlabored ventilation, respiratory function stable and patient connected to nasal cannula oxygen Cardiovascular status: blood pressure returned to baseline and stable Postop Assessment: no signs of nausea or vomiting Anesthetic complications: no    Last Vitals:  Vitals:   01/16/17 1115 01/16/17 1130  BP: (!) 108/49 (!) 114/46  Pulse: 74 63  Resp: 10 12  Temp: 36.4 C 36.4 C  SpO2: 98% 100%    Last Pain:  Vitals:   01/16/17 1115  TempSrc:   PainSc: [place]

## 2017-01-16 NOTE — Care Management Obs Status (Signed)
[facility] NOTIFICATION   Patient Details  Name: [name] MRN: [id number] Date of Birth: 1940-06-07   Medicare Observation Status Notification Given:  Yes    Lynnell Catalan, RN 01/16/2017, 1:01 PM

## 2017-01-17 DIAGNOSIS — N179 Acute kidney failure, unspecified: Secondary | ICD-10-CM | POA: Diagnosis not present

## 2017-01-17 DIAGNOSIS — I739 Peripheral vascular disease, unspecified: Secondary | ICD-10-CM

## 2017-01-17 DIAGNOSIS — K829 Disease of gallbladder, unspecified: Secondary | ICD-10-CM

## 2017-01-17 DIAGNOSIS — N183 Chronic kidney disease, stage 3 (moderate): Secondary | ICD-10-CM | POA: Diagnosis not present

## 2017-01-17 DIAGNOSIS — F17200 Nicotine dependence, unspecified, uncomplicated: Secondary | ICD-10-CM

## 2017-01-17 DIAGNOSIS — J449 Chronic obstructive pulmonary disease, unspecified: Secondary | ICD-10-CM | POA: Diagnosis not present

## 2017-01-17 LAB — CBC
HEMATOCRIT: 35.1 % — AB (ref 36.0–46.0)
Hemoglobin: 11.4 g/dL — ABNORMAL LOW (ref 12.0–15.0)
MCH: 31.3 pg (ref 26.0–34.0)
MCHC: 32.5 g/dL (ref 30.0–36.0)
MCV: 96.4 fL (ref 78.0–100.0)
PLATELETS: 187 10*3/uL (ref 150–400)
RBC: 3.64 MIL/uL — ABNORMAL LOW (ref 3.87–5.11)
RDW: 13.8 % (ref 11.5–15.5)
WBC: 10.3 10*3/uL (ref 4.0–10.5)

## 2017-01-17 LAB — BASIC METABOLIC PANEL
ANION GAP: 7 (ref 5–15)
BUN: 17 mg/dL (ref 6–20)
CALCIUM: 9.2 mg/dL (ref 8.9–10.3)
CO2: 26 mmol/L (ref 22–32)
Chloride: 103 mmol/L (ref 101–111)
Creatinine, Ser: 1.42 mg/dL — ABNORMAL HIGH (ref 0.44–1.00)
GFR, EST AFRICAN AMERICAN: 40 mL/min — AB (ref >60)
GFR, EST NON AFRICAN AMERICAN: 35 mL/min — AB (ref >60)
Glucose, Bld: 118 mg/dL — ABNORMAL HIGH (ref 65–99)
POTASSIUM: 4 mmol/L (ref 3.5–5.1)
Sodium: 136 mmol/L (ref 135–145)

## 2017-01-17 MED ORDER — OXYCODONE-ACETAMINOPHEN 5-325 MG PO TABS: 1.0000 | ORAL_TABLET | Freq: Four times a day (QID) | ORAL | 0 refills | Status: DC | PRN

## 2017-01-17 MED ORDER — PANTOPRAZOLE SODIUM 40 MG PO TBEC
40.0000 mg | DELAYED_RELEASE_TABLET | Freq: Every day | ORAL | Status: DC
  Administered 2017-01-17: 40 mg via ORAL
  Filled 2017-01-17: qty 1

## 2017-01-17 MED ORDER — CLOPIDOGREL BISULFATE 75 MG PO TABS: 75.0000 mg | ORAL_TABLET | Freq: Every day | ORAL | 12 refills | Status: DC

## 2017-01-17 MED ORDER — OXYCODONE-ACETAMINOPHEN 5-325 MG PO TABS
1.0000 | ORAL_TABLET | Freq: Four times a day (QID) | ORAL | Status: DC | PRN
  Administered 2017-01-17: 1 via ORAL
  Filled 2017-01-17: qty 1

## 2017-01-17 NOTE — Discharge Summary (Signed)
Physician Discharge Summary  [name] ZOX:[id number] DOB: 07-Jun-1940 DOA: 01/14/2017  PCP: Sid Falcon, MD  Admit date: 01/14/2017 Discharge date: 01/17/2017  Admitted From: home Disposition:  home  Recommendations for Outpatient Follow-up:  1. Follow up with Dr. Marlou Starks in 2-3 weeks 2. Hold Plavix for a week  Home Health: none Equipment/Devices: none  Discharge Condition: stable CODE STATUS: Full code Diet recommendation: regular  HPI: Per Dr. Carolin Sicks, [name] is a 76 y.o. female with medical history significant of hypertension, hyperlipidemia, peripheral vascular disease on aspirin and Plavix, active smoker, COPD, DVT, CKD presented with nausea vomiting and abdominal pain. Patient was seen in the ED on 9/2 for similar problem when patient clinically improved with GI cocktail. Patient was discharged with Keflex for possible UTI. The urine culture grew multipleis likely contaminant. Patient now presented with epigastric pain and right upper quadrant pain. Denies nausea vomiting, fever, chills, chest pain, shortness of breath. Patient is poor historian.  ED Course: In the ER, ultrasound of abdomen showed cholelithiasis and gallbladder sludge likely chronic cholecystitis. Evaluated by general surgery recommended admission by [facility] and possible surgical intervention. Patient with no fever and no leukocytosis. She agreed for admission.  Hospital Course: Discharge Diagnoses:  Active Problems:   TOBACCO ABUSE   CKD (chronic kidney disease) stage 3, GFR 30-59 ml/min   COPD (chronic obstructive pulmonary disease) (HCC)   Pain in the abdomen   PVD (peripheral vascular disease) s/p LLE PTA 03/2016, RLE PTA 04/2016   Cholecystitis   Acute kidney injury superimposed on CKD (HCC)   Abdominal pain (epigastric and right upper quadrant) due to chronic cholecystitis -patient was admitted to the hospital with cholecystitis, general surgery was consulted.  She was eventually taken to  the operating room on 01/16/2017 and underwent a laparoscopic cholecystectomy, negative IOC.  She recovered well postop, she was seen by general surgery on discharge day, was tolerating a regular diet and was discharged home in stable condition with outpatient follow-up.  Was advised to hold Plavix for a week. History of hypertension -resume home medications Acute kidney injury on chronic kidney disease stage III likely hemodynamically mediated -Creatinine improved to baseline with IV fluids 2/05 >>1.61 >> 1.45, and is 1.42 on the day of discharge COPD -No wheezing, stable, albuterol as needed Tobacco abuse -Nicotine patch, counseled for cessation, she does not seem ready to quit Hyperlipidemia  -Continue statin   Discharge Instructions   Allergies as of 01/17/2017      Reactions   Codeine Other (See Comments)   REACTION: Pt c/o "feeling high"   Morphine Other (See Comments)   REACTION: feels funny and "high"      Medication List    TAKE these medications   acetaminophen 500 MG tablet Commonly known as:  TYLENOL Take 500 mg by mouth every 6 (six) hours as needed for mild pain, moderate pain or headache.   albuterol 108 (90 Base) MCG/ACT inhaler Commonly known as:  PROVENTIL HFA;VENTOLIN HFA Inhale 2 puffs into the lungs every 6 (six) hours as needed for wheezing or shortness of breath.   aspirin 81 MG EC tablet Take 1 tablet (81 mg total) by mouth daily.   atorvastatin 40 MG tablet Commonly known as:  LIPITOR Take 40 mg by mouth daily.   cephALEXin 500 MG capsule Commonly known as:  KEFLEX Take 1 capsule (500 mg total) by mouth 2 (two) times daily.   clopidogrel 75 MG tablet Commonly known as:  PLAVIX Take 1  tablet (75 mg total) by mouth daily with breakfast.   diclofenac sodium 1 % Gel Commonly known as:  VOLTAREN Apply 4 g topically 4 (four) times daily. What changed:  when to take this  reasons to take this   famotidine 20 MG tablet Commonly known as:   PEPCID Take 1 tablet (20 mg total) by mouth 2 (two) times daily.   hydrochlorothiazide 12.5 MG capsule Commonly known as:  MICROZIDE Take 1 capsule (12.5 mg total) by mouth daily.   nicotine 21 mg/24hr patch Commonly known as:  NICODERM CQ - dosed in mg/24 hours Place 1 patch (21 mg total) onto the skin daily.   nicotine 14 mg/24hr patch Commonly known as:  NICODERM CQ - dosed in mg/24 hours Place 1 patch (14 mg total) onto the skin daily.   nicotine 7 mg/24hr patch Commonly known as:  NICODERM CQ - dosed in mg/24 hr Place 1 patch (7 mg total) onto the skin daily.   ondansetron 8 MG disintegrating tablet Commonly known as:  ZOFRAN ODT Take 1 tablet (8 mg total) by mouth every 8 (eight) hours as needed for nausea or vomiting.   oxyCODONE-acetaminophen 5-325 MG tablet Commonly known as:  PERCOCET/ROXICET Take 1 tablet by mouth every 6 (six) hours as needed for severe pain.   polyethylene glycol powder powder Commonly known as:  MIRALAX Take 17 g by mouth daily.   prednisoLONE acetate 1 % ophthalmic suspension Commonly known as:  PRED FORTE Place 1 drop into both eyes daily.   traMADol 50 MG tablet Commonly known as:  ULTRAM Take 1-2 tablets (50-100 mg total) by mouth every 12 (twelve) hours as needed.   Vitamin D (Cholecalciferol) 1000 units Caps Take 1,000 Units by mouth daily.            Discharge Care Instructions        Start     Ordered   01/17/17 0000  oxyCODONE-acetaminophen (PERCOCET/ROXICET) 5-325 MG tablet  Every 6 hours PRN     01/17/17 0945     Follow-up Information    Autumn Messing III, MD. Schedule an appointment as soon as possible for a visit in 2 week(s).   Specialty:  General Surgery Contact information: [address] [place] [address] [phone]        Sid Falcon, MD. Schedule an appointment as soon as possible for a visit in 3 week(s).   Specialty:  Internal Medicine Contact information: [place] [address] [phone]          Allergies  Allergen Reactions  . Codeine Other (See Comments)    REACTION: Pt c/o "feeling high"  . Morphine Other (See Comments)    REACTION: feels funny and "high"    Consultations:  General surgery  Procedures/Studies:  Laparoscopic cholecystectomy 9/7, Dr. Marlou Starks  Dg Chest 2 View  Result Date: 01/11/2017 CLINICAL DATA:  Lower abdominal pain, onset yesterday EXAM: CHEST  2 VIEW COMPARISON:  03/06/2016 FINDINGS: Extensive chronic appearing interstitial coarsening. No confluent airspace consolidation. Stable left hemidiaphragm elevation. Normal heart size. Cannot exclude a mass superimposed on the right first rib end. IMPRESSION: 1. Irregular opacity in the right apex; cannot exclude a mass. Consider chest CT. 2. Chronic interstitial coarsening. Electronically Signed   By: Andreas Newport M.D.   On: 01/11/2017 05:20   Dg Cholangiogram Operative  Result Date: 01/16/2017 CLINICAL DATA:  Cholelithiasis EXAM: INTRAOPERATIVE CHOLANGIOGRAM TECHNIQUE: Cholangiographic images from the C-arm fluoroscopic device were submitted  for interpretation post-operatively. Please see the procedural report for the amount of contrast and the fluoroscopy time utilized. COMPARISON:  01/14/2017 FINDINGS: The residual cystic duct, common hepatic duct, and common bile duct are patent. Contrast drains into the duodenum. Contrast also reflux into the pancreatic duct. Mixing artifact or small air bubble suspected in the proximal common hepatic duct. No biliary stricture or obstruction. IMPRESSION: Patent biliary system. Electronically Signed   By: Jerilynn Mages.  Shick M.D.   On: 01/16/2017 10:28   US Abdomen Complete  Result Date: 01/14/2017 CLINICAL DATA:  Abdominal pain for 1 week. History of cholelithiasis. EXAM: ABDOMEN ULTRASOUND COMPLETE COMPARISON:  01/11/2017 CT abdomen/ pelvis. FINDINGS: Gallbladder: Shadowing 1.6 cm gallbladder neck gallstone. No significant  gallbladder distention. Sludge in the gallbladder. Mild diffuse gallbladder wall thickening. No pericholecystic fluid or sonographic Murphy's sign. Common bile duct: Diameter: 4 mm Liver: No focal lesion identified. Within normal limits in parenchymal echogenicity. Portal vein is patent on color Doppler imaging with normal direction of blood flow towards the liver. IVC: No abnormality visualized. Pancreas: Not visualized due to overlying bowel gas. Spleen: Size and appearance within normal limits. Right Kidney: Length: 8.8 cm. Echogenicity within normal limits. No hydronephrosis. Simple 1.1 x 1.2 x 1.4 cm upper right renal cysts. Left Kidney: Length: 10.3 cm. Echogenicity within normal limits. No hydronephrosis. Simple 5.5 x 5.6 x 5.3 cm interpolar left renal cyst. Abdominal aorta: No aneurysm visualized. Other findings: None. IMPRESSION: 1. Cholelithiasis and gallbladder sludge. Mild diffuse gallbladder wall thickening. No pericholecystic fluid or sonographic Murphy's sign. 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 If there is clinical concern for acute cholecystitis, consider correlation with hepatobiliary scintigraphy study. 2. No biliary ductal dilatation. 3. Simple bilateral renal cysts. Otherwise normal abdominal sonogram, with limitations as described. Electronically Signed   By: Ilona Sorrel M.D.   On: 01/14/2017 08:27   Ct Abdomen Pelvis W Contrast  Addendum Date: 01/14/2017   ADDENDUM REPORT: 01/14/2017 22:20 ADDENDUM: Correction to dictation section " FINDINGS " . Reproductive:  The uterus and adnexa are unremarkable. Electronically Signed   By: Kristine Garbe M.D.   On: 01/14/2017 22:20   Result Date: 01/14/2017 CLINICAL DATA:  76 y/o  F; diffuse and severe abdominal pain. EXAM: CT ABDOMEN AND PELVIS WITH CONTRAST TECHNIQUE: Multidetector CT imaging of the abdomen and pelvis was performed using the standard protocol following  bolus administration of intravenous contrast. CONTRAST:  53mL ISOVUE-300 IOPAMIDOL (ISOVUE-300) INJECTION 61% COMPARISON:  09/22/2016 CT abdomen and pelvis. FINDINGS: Lower chest: Bilateral lung base fibrosis. Hepatobiliary: No focal liver abnormality is seen. Gallstones measuring up to 16 mm. No gallbladder wall thickening or pericholecystic fluid identified. No intra or extrahepatic biliary ductal dilatation. Pancreas: Unremarkable. No pancreatic ductal dilatation or surrounding inflammatory changes. Spleen: Scattered calcifications throughout the spleen compatible sequelae prior granulomatous disease. Adrenals/Urinary Tract: Multiple renal cysts the largest in the left upper pole measuring 5.6 cm are stable. Punctate hilar calcification bilaterally, likely vascular. Normal adrenal glands. No hydronephrosis. Normal bladder. Stomach/Bowel: Stomach is within normal limits. Appendix appears normal. No evidence of bowel wall thickening, distention, or inflammatory changes. Vascular/Lymphatic: Aortic atherosclerosis with severe calcification. No enlarged abdominal or pelvic lymph nodes. Reproductive: Prostate is unremarkable. Other: No abdominal wall hernia or abnormality. No abdominopelvic ascites. Musculoskeletal: Stable T12 mild superior endplate deformity, L1 mild inferior endplate deformity, and L3 mild compression deformity. IMPRESSION: 1. No acute process identified as explanation for abdominal pain. 2. Stable bilateral lung base fibrosis.  3. Stable cholelithiasis. 4. Stable kidney cyst. 5. Severe aortic calcific atherosclerosis. 6. Multiple stable lower thoracic and lumbar compression deformities of the spine. No acute osseous abnormality identified. Electronically Signed: By: Kristine Garbe M.D. On: 01/11/2017 05:46      Subjective: - no chest pain, shortness of breath, minimal abdominal pain, no nausea or vomiting.   Discharge Exam: Vitals:   01/17/17 0150 01/17/17 0421  BP: 114/60 126/69   Pulse: 69 64  Resp: 18 18  Temp: 98.2 F (36.8 C) 97.7 F (36.5 C)  SpO2: 96% 96%   Vitals:   01/16/17 1430 01/16/17 2042 01/17/17 0150 01/17/17 0421  BP: (!) 109/55 119/61 114/60 126/69  Pulse: 72 63 69 64  Resp: 15 18 18 18   Temp: 97.6 F (36.4 C) 98.1 F (36.7 C) 98.2 F (36.8 C) 97.7 F (36.5 C)  TempSrc: Oral Oral Oral Oral  SpO2: 100% 97% 96% 96%  Weight:      Height:        General: Pt is alert, awake, not in acute distress Cardiovascular: RRR, S1/S2 +, no rubs, no gallops Respiratory: CTA bilaterally, no wheezing, no rhonchi Abdominal: Soft, bowel sounds + Extremities: no edema, no cyanosis    The results of significant diagnostics from this hospitalization (including imaging, microbiology, ancillary and laboratory) are listed below for reference.     Microbiology: Recent Results (from the past 240 hour(s))  Urine culture     Status: Abnormal   Collection Time: 01/11/17  1:36 AM  Result Value Ref Range Status   Specimen Description URINE, CLEAN CATCH  Final   Special Requests NONE  Final   Culture MULTIPLE SPECIES PRESENT, SUGGEST RECOLLECTION (A)  Final   Report Status 01/12/2017 FINAL  Final  Surgical pcr screen     Status: None   Collection Time: 01/16/17  6:44 AM  Result Value Ref Range Status   MRSA, PCR NEGATIVE NEGATIVE Final   Staphylococcus aureus NEGATIVE NEGATIVE Final    Comment: (NOTE) The Xpert SA Assay (FDA approved for NASAL specimens in patients 55 years of age and older), is one component of a comprehensive surveillance program. It is not intended to diagnose infection nor to guide or monitor treatment.      Labs: BNP (last 3 results) No results for input(s): BNP in the last 8760 hours. Basic Metabolic Panel:  Recent Labs Lab 01/11/17 0136 01/14/17 0629 01/14/17 0646 01/15/17 0524 01/16/17 0444 01/17/17 0634  NA 138 137 137 142 140 136  K 4.9 3.9 3.9 4.3 4.2 4.0  CL 103 101 102 106 105 103  CO2 27  --  26 29 29 26    GLUCOSE 116* 104* 106* 104* 94 118*  BUN 34* 37* 39* 29* 20 17  CREATININE 1.49* 1.90* 2.05* 1.61* 1.45* 1.42*  CALCIUM 9.6  --  9.1 9.3 9.3 9.2   Liver Function Tests:  Recent Labs Lab 01/11/17 0136 01/14/17 0646 01/15/17 0524  AST 13* 14* 12*  ALT 11* 10* 10*  ALKPHOS 70 72 63  BILITOT 0.7 0.7 1.4*  PROT 6.9 6.9 6.3*  ALBUMIN 3.7 3.7 3.2*    Recent Labs Lab 01/11/17 0136 01/14/17 0646  LIPASE 22 22   No results for input(s): AMMONIA in the last 168 hours. CBC:  Recent Labs Lab 01/11/17 0136 01/14/17 0629 01/14/17 0646 01/15/17 0524 01/16/17 0444 01/17/17 0634  WBC 6.4  --  7.6 5.5 4.9 10.3  NEUTROABS  --   --  5.1  --   --   --  HGB 13.5 15.0 13.5 12.5 12.3 11.4*  HCT 40.7 44.0 40.8 38.2 37.6 35.1*  MCV 94.9  --  93.8 95.7 95.9 96.4  PLT 207  --  188 178 206 187   Cardiac Enzymes: No results for input(s): CKTOTAL, CKMB, CKMBINDEX, TROPONINI in the last 168 hours. BNP: Invalid input(s): POCBNP CBG: No results for input(s): GLUCAP in the last 168 hours. D-Dimer No results for input(s): DDIMER in the last 72 hours. Hgb A1c No results for input(s): HGBA1C in the last 72 hours. Lipid Profile No results for input(s): CHOL, HDL, LDLCALC, TRIG, CHOLHDL, LDLDIRECT in the last 72 hours. Thyroid function studies No results for input(s): TSH, T4TOTAL, T3FREE, THYROIDAB in the last 72 hours.  Invalid input(s): FREET3 Anemia work up No results for input(s): VITAMINB12, FOLATE, FERRITIN, TIBC, IRON, RETICCTPCT in the last 72 hours. Urinalysis    Component Value Date/Time   COLORURINE YELLOW 01/14/2017 0609   APPEARANCEUR CLOUDY (A) 01/14/2017 0609   LABSPEC 1.017 01/14/2017 0609   PHURINE 5.0 01/14/2017 0609   GLUCOSEU NEGATIVE 01/14/2017 0609   HGBUR SMALL (A) 01/14/2017 0609   BILIRUBINUR NEGATIVE 01/14/2017 0609   BILIRUBINUR NEG 10/10/2010 [place] 01/14/2017 0609   PROTEINUR NEGATIVE 01/14/2017 0609   UROBILINOGEN 0.2 03/06/2015  0938   NITRITE NEGATIVE 01/14/2017 0609   LEUKOCYTESUR LARGE (A) 01/14/2017 0609   Sepsis Labs Invalid input(s): PROCALCITONIN,  WBC,  LACTICIDVEN Microbiology Recent Results (from the past 240 hour(s))  Urine culture     Status: Abnormal   Collection Time: 01/11/17  1:36 AM  Result Value Ref Range Status   Specimen Description URINE, CLEAN CATCH  Final   Special Requests NONE  Final   Culture MULTIPLE SPECIES PRESENT, SUGGEST RECOLLECTION (A)  Final   Report Status 01/12/2017 FINAL  Final  Surgical pcr screen     Status: None   Collection Time: 01/16/17  6:44 AM  Result Value Ref Range Status   MRSA, PCR NEGATIVE NEGATIVE Final   Staphylococcus aureus NEGATIVE NEGATIVE Final    Comment: (NOTE) The Xpert SA Assay (FDA approved for NASAL specimens in patients 8 years of age and older), is one component of a comprehensive surveillance program. It is not intended to diagnose infection nor to guide or monitor treatment.      Time coordinating discharge: 35 minutes  SIGNED:  Marzetta Board, MD  [facility] 01/17/2017, 1:48 PM Pager [phone]  If 7PM-7AM, please contact night-coverage [email or web address] Password [email or web address]

## 2017-01-17 NOTE — Progress Notes (Signed)
1 Day Post-Op  Subjective: Feeling better.  Min pain  Objective: Vital signs in last 24 hours: Temp:  [97.5 F (36.4 C)-98.2 F (36.8 C)] 97.7 F (36.5 C) (09/08 0421) Pulse Rate:  [63-74] 64 (09/08 0421) Resp:  [10-18] 18 (09/08 0421) BP: (108-126)/(44-69) 126/69 (09/08 0421) SpO2:  [96 %-100 %] 96 % (09/08 0421)   Intake/Output from previous day: 09/07 0701 - 09/08 0700 In: 2752.5 [P.O.:600; I.V.:2152.5] Out: 675 [Urine:650; Blood:25] Intake/Output this shift: No intake/output data recorded.   General appearance: alert and cooperative GI: normal findings: soft, non-tender  Incision: no significant drainage  Lab Results:   Recent Labs  01/16/17 0444 01/17/17 0634  WBC 4.9 10.3  HGB 12.3 11.4*  HCT 37.6 35.1*  PLT 206 187   BMET  Recent Labs  01/16/17 0444 01/17/17 0634  NA 140 136  K 4.2 4.0  CL 105 103  CO2 29 26  GLUCOSE 94 118*  BUN 20 17  CREATININE 1.45* 1.42*  CALCIUM 9.3 9.2   PT/INR  Recent Labs  01/15/17 0524  LABPROT 13.2  INR 1.01   ABG No results for input(s): PHART, HCO3 in the last 72 hours.  Invalid input(s): PCO2, PO2  MEDS, Scheduled . atorvastatin  40 mg Oral Daily  . cholecalciferol  1,000 Units Oral Daily  . nicotine  14 mg Transdermal Q24H  . pantoprazole  40 mg Oral Daily  . prednisoLONE acetate  1 drop Both Eyes Daily    Studies/Results: Dg Cholangiogram Operative  Result Date: 01/16/2017 CLINICAL DATA:  Cholelithiasis EXAM: INTRAOPERATIVE CHOLANGIOGRAM TECHNIQUE: Cholangiographic images from the C-arm fluoroscopic device were submitted for interpretation post-operatively. Please see the procedural report for the amount of contrast and the fluoroscopy time utilized. COMPARISON:  01/14/2017 FINDINGS: The residual cystic duct, common hepatic duct, and common bile duct are patent. Contrast drains into the duodenum. Contrast also reflux into the pancreatic duct. Mixing artifact or small air bubble suspected in the  proximal common hepatic duct. No biliary stricture or obstruction. IMPRESSION: Patent biliary system. Electronically Signed   By: Jerilynn Mages.  Shick M.D.   On: 01/16/2017 10:28    Assessment: s/p Procedure(s): LAPAROSCOPIC CHOLECYSTECTOMY WITH  INTRAOPERATIVE CHOLANGIOGRAM Patient Active Problem List   Diagnosis Date Noted  . Cholecystitis 01/14/2017  . Acute kidney injury superimposed on CKD ([place])   . 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 Claudication in peripheral vascular disease ([place]) 03/25/2016  . PVD (peripheral vascular disease) s/p LLE PTA 03/2016, RLE PTA 04/2016   . Bilateral foot pain 02/20/2016  . Epidermoid cyst 11/01/2015  . Pain in the abdomen 07/11/2014  . Vitamin D deficiency 03/29/2014  . COPD (chronic obstructive pulmonary disease) ([place]) 02/21/2014  . Postmenopausal 06/16/2013  . Preventative health care 06/16/2013  . Bilateral shoulder pain 09/01/2012  . Bilateral lower extremity edema 11/19/2011  . CKD (chronic kidney disease) stage 3, GFR 30-59 ml/min 11/28/2009  . Hyperlipidemia 07/02/2006  . DEAFNESS 05/13/2006  . TOBACCO ABUSE 03/26/2006  . BLINDNESS 03/26/2006  . Essential hypertension 03/26/2006    Expected post op course  Plan: [place] to d/c once medically stable   LOS: 1 day     .Rosario Adie, [facility], [place]   01/17/2017 9:12 AM

## 2017-01-17 NOTE — Progress Notes (Signed)

## 2017-01-17 NOTE — Care Management Note (Signed)
Case Management Note  Patient Details  Name: [name] MRN: [id number] Date of Birth: [birth year]  Subjective/Objective:                 Patient with order to DC to home today. Chart reviewed. No Home Health or Equipment needs, no unacknowledged Case Management consults or medication needs identified at the time of this note. Plan for DC to home. If needs arise today prior to discharge, please call Carles Collet RN CM at [phone].    Action/Plan:   Expected Discharge Date:  01/17/17               Expected Discharge Plan:  Home/Self Care  In-House Referral:     Discharge planning Services  CM Consult  Post Acute Care Choice:    Choice offered to:     DME Arranged:    DME Agency:     HH Arranged:    HH Agency:     Status of Service:  Completed, signed off  If discussed at [employer] of Stay Meetings, dates discussed:    Additional Comments:  Carles Collet, RN 01/17/2017, 9:53 AM

## 2017-01-17 NOTE — Discharge Instructions (Addendum)
LAPAROSCOPIC SURGERY: POST OP INSTRUCTIONS ° °1. DIET: Follow a light bland diet the first 24 hours after arrival home, such as soup, liquids, crackers, etc.  Be sure to include lots of fluids daily.  Avoid fast food or heavy meals as your are more likely to get nauseated.  Eat a low fat the next few days after surgery.   °2. Take your usually prescribed home medications unless otherwise directed. °3. PAIN CONTROL: °a. Pain is best controlled by a usual combination of three different methods TOGETHER: °i. Ice/Heat °ii. Over the counter pain medication °iii. Prescription pain medication °b. Most patients will experience some swelling and bruising around the incisions.  Ice packs or heating pads (30-60 minutes up to 6 times a day) will help. Use ice for the first few days to help decrease swelling and bruising, then switch to heat to help relax tight/sore spots and speed recovery.  Some people prefer to use ice alone, heat alone, alternating between ice & heat.  Experiment to what works for you.  Swelling and bruising can take several weeks to resolve.   °c. It is helpful to take an over-the-counter pain medication regularly for the first few weeks.  Choose one of the following that works best for you: °i. Naproxen (Aleve, etc)  Two 220mg tabs twice a day °ii. Ibuprofen (Advil, etc) Three 200mg tabs four times a day (every meal & bedtime) °d. A  prescription for pain medication (such as percocet, vicodin, oxycodone, hydrocodone, etc) should be given to you upon discharge.  Take your pain medication as prescribed.  °i. If you are having problems/concerns with the prescription medicine (does not control pain, nausea, vomiting, rash, itching, etc), please call us [phone] to see if we need to switch you to a different pain medicine that will work better for you and/or control your side effect better. °ii. If you need a refill on your pain medication, please contact your pharmacy.  They will contact our office to  request authorization. Prescriptions will not be filled after 5 pm or on week-ends. ° ° °4. Avoid getting constipated.  Between the surgery and the pain medications, it is common to experience some constipation.  Increasing fluid intake and taking a fiber supplement (such as Metamucil, Citrucel, FiberCon, MiraLax, etc) 1-2 times a day regularly will usually help prevent this problem from occurring.  A mild laxative (prune juice, Milk of Magnesia, MiraLax, etc) should be taken according to package directions if there are no bowel movements after 48 hours.   °5. Watch out for diarrhea.  If you have many loose bowel movements, simplify your diet to bland foods & liquids for a few days.  Stop any stool softeners and decrease your fiber supplement.  Switching to mild anti-diarrheal medications (Kayopectate, Pepto Bismol) can help.  If this worsens or does not improve, please call us. °6. Wash / shower every day.  You may shower over the dressings as they are waterproof.  Continue to shower over incision(s) after the dressing is off. °7. Remove your waterproof bandages 5 days after surgery.  You may leave the incision open to air.  You may replace a dressing/Band-Aid to cover the incision for comfort if you wish.  °8. ACTIVITIES as tolerated:   °a. You may resume regular (light) daily activities beginning the next day--such as daily self-care, walking, climbing stairs--gradually increasing activities as tolerated.  If you can walk 30 minutes without difficulty, it is safe to try more intense activity such as   jogging, treadmill, bicycling, low-impact aerobics, swimming, etc. b. Save the most intensive and strenuous activity for last such as sit-ups, heavy lifting, contact sports, etc  Refrain from any heavy lifting or straining until you are off narcotics for pain control.   c. DO NOT PUSH THROUGH PAIN.  Let pain be your guide: If it hurts to do something, don't do it.  Pain is your body warning you to avoid that  activity for another week until the pain goes down. d. You may drive when you are no longer taking prescription pain medication, you can comfortably wear a seatbelt, and you can safely maneuver your car and apply brakes. e. Dennis Bast may have sexual intercourse when it is comfortable.  9. FOLLOW UP in our office a. Please call [facility] at [phone] to set up an appointment to see your surgeon in the office for a follow-up appointment approximately 2-3 weeks after your surgery. b. Make sure that you call for this appointment the day you arrive home to insure a convenient appointment time. 10. IF YOU HAVE DISABILITY OR FAMILY LEAVE FORMS, BRING THEM TO THE OFFICE FOR PROCESSING.  DO NOT GIVE THEM TO YOUR DOCTOR.   WHEN TO CALL us [phone]: 1. Poor pain control 2. Reactions / problems with new medications (rash/itching, nausea, etc)  3. Fever over 101.5 F (38.5 C) 4. Inability to urinate 5. Nausea and/or vomiting 6. Worsening swelling or bruising 7. Continued bleeding from incision. 8. Increased pain, redness, or drainage from the incision   The clinic staff is available to answer your questions during regular business hours (8:30am-5pm).  Please dont hesitate to call and ask to speak to one of our nurses for clinical concerns.   If you have a medical emergency, go to the nearest emergency room or call 911.  A surgeon from [facility] is always on call at the [facility], [place], [place], [place], [place]  [address] ? MAIN: [phone] ? TOLL FREE: [phone] ?  FAX [phone] [email or web address]     Resume your Plavix in 7 days

## 2017-01-20 ENCOUNTER — Other Ambulatory Visit: Payer: Self-pay | Admitting: Internal Medicine

## 2017-01-20 NOTE — Telephone Encounter (Signed)
Pls requesting a refill

## 2017-01-20 NOTE — Telephone Encounter (Signed)
traMADol (ULTRAM) 50 MG tablet   Refill request

## 2017-01-21 MED ORDER — TRAMADOL HCL 50 MG PO TABS: 50.0000 mg | ORAL_TABLET | Freq: Two times a day (BID) | ORAL | 0 refills | Status: DC | PRN

## 2017-01-21 NOTE — Telephone Encounter (Signed)
Tramadol called in to [facility].

## 2017-01-26 ENCOUNTER — Encounter: Payer: Self-pay | Admitting: Internal Medicine

## 2017-01-26 ENCOUNTER — Ambulatory Visit (INDEPENDENT_AMBULATORY_CARE_PROVIDER_SITE_OTHER): Payer: Medicare Other | Admitting: Internal Medicine

## 2017-01-26 DIAGNOSIS — M79672 Pain in left foot: Secondary | ICD-10-CM | POA: Diagnosis not present

## 2017-01-26 DIAGNOSIS — M79671 Pain in right foot: Secondary | ICD-10-CM

## 2017-01-26 DIAGNOSIS — Z79899 Other long term (current) drug therapy: Secondary | ICD-10-CM

## 2017-01-26 DIAGNOSIS — K819 Cholecystitis, unspecified: Secondary | ICD-10-CM

## 2017-01-26 DIAGNOSIS — F17210 Nicotine dependence, cigarettes, uncomplicated: Secondary | ICD-10-CM

## 2017-01-26 NOTE — Assessment & Plan Note (Signed)
Chronic bilateral foot pain, multifactorial in etiology. Patient has not had relief with increased dose of Tramadol or Voltaren gel. She was seen by Podiatry on 8/14 for symptomatic mycotic toenails, porokeratosis, and plantar callus, which were debrided without complication. On physical exam, did not appreciate open wounds, erythema, or joint swelling as the source of her pain. She had been taking 1-2 Percocet tablets daily for her foot pain. She states this helped her pain and made walking tolerable. She was requesting a refill for the Percocet.    Plan: -Discussed with patient that she should discuss her pain relief with Percocet with her PCP, Dr. Daryll Drown, and that it will not be refilled for her at this visit -Follow up with Podiatry in 2 months

## 2017-01-26 NOTE — Progress Notes (Signed)
   CC: chronic cholecystitis  HPI:  [name] is a 76 y.o. with past medical history as documented below presenting for hospital follow up after laparoscopic cholecystectomy. The patient presented to the ED on 01/14/2017 for nausea, vomiting, and abdominal pain.  Abdominal ultrasound had findings consistent with chronic cholecystitis, and she underwent a laparoscopic cholecystectomy on 01/16/2017 . She tolerated the surgery well and her post-operative course were unremarkable. Today she states she has some abdominal soreness, mainly around the incision sites. She was given 20 tablets of Percocet, which have helped her post operative pain. She has five tablets remaining. She is tolerating a diet well and is having normal bowel movements. She denies fever, chills, nausea, vomiting, chest pain, or shortness of breath. She has also restarted her Plavix.   She is also complaining of bilateral foot pain, which is worse with walking. Per chart review this seems to be a chronic issue for the patient. She states that Tramadol 100 mg and Voltaren gel have not helped her pain. She states that she is now taking the Percocet for her foot pain and and not her post surgical abdominal pain. She states her foot pain is much worse than her post-op pain. She is requesting refill of the Percocet.   Past Medical History:  Diagnosis Date  . Arthritis    "left shoulder" (04/07/2016)  . Blind    s/p bilateral corneal transplant; "legally blind in both eyes" (04/07/2016)  . CKD (chronic kidney disease), stage III   . COPD (chronic obstructive pulmonary disease) ([place])   . DVT (deep venous thrombosis) ([place]) "years ago"   "right thigh"    . HEARING LOSS   . Hyperlipidemia   . Hypertension   . PAD (peripheral artery disease) ([place])    a. 03/2016 Periph Angio: L SFA 99p, 76m w/ one vessel runoff. R SFA 166m w/ one vessel runofff;  b. 04/07/2016 PTA L SFA ([place] 1 LS atherecromty w/ DEBA). c. 04/24/16 s/p PTA to R SFA,  right tibioperoneal trunk and posterior tibial artery.  . Phlebitis   . Tobacco abuse    Review of Systems:  Review of Systems  Constitutional: Negative for chills and fever.  Respiratory: Negative for shortness of breath.   Cardiovascular: Negative for chest pain.  Gastrointestinal: Positive for abdominal pain. Negative for constipation, diarrhea, nausea and vomiting.  Genitourinary: Negative for dysuria.  Musculoskeletal: Positive for joint pain.  Neurological: Negative.    Physical Exam:  Vitals:   01/26/17 0858  BP: (!) 147/65  Pulse: 91  Temp: 97.6 F (36.4 C)  TempSrc: Oral  SpO2: 95%  Weight: 141 lb 6.4 oz (64.1 kg)  Height: 5\' 4"  (1.626 m)   General: Sitting comfortably, NAD HEENT: [place]/AT, EOMI, no scleral icterus, PERRL Cardiac: RRR, No R/M/G appreciated Pulm: normal effort, expiratory wheezes bilaterally throughout all lung fields Abd: Surgical sites healing well, no oozing/drainage, no erythema; soft, non tender, non distended, BS normal Ext: extremities well perfused, no peripheral edema  Feet: No wounds, erythema, or joint effusion, no calluses  Neuro: alert and oriented X3, cranial nerves II-XII grossly intact   Assessment & Plan:   See Encounters Tab for problem based charting.  Patient seen with Dr. Angelia Mould

## 2017-01-26 NOTE — Assessment & Plan Note (Signed)
S/p laparoscopic cholecystectomy POD #10, tolerated the surgery well. Abdominal exam was positive for mild TTP around incision sites, which were healing very well without erythema or drainage. The patients pain is appropriate post operative pain. She is tolerating a normal diet, moving her bowels appropriately, and denies fever, chills, N/V/D. She no longer is requiring the percocet for her abdominal pain.   Plan: -RTC if develop worsening abdominal pain from areas around incision sites; reassured the patient she is healing very well  -Tylenol PRN for pain

## 2017-01-26 NOTE — Patient Instructions (Addendum)
Ms. Cogliano,   It was a pleasure meeting you today.   You are doing very well after your surgery. Continue all your medications, I have made no changes.   Please discuss the foot pain you have been experiencing with Dr. Daryll Drown at your next visit.

## 2017-01-27 NOTE — Progress Notes (Signed)
Internal Medicine Clinic Attending  I saw and evaluated the patient.  I personally confirmed the key portions of the history and exam documented by Dr. LaCroce and I reviewed pertinent patient test results.  The assessment, diagnosis, and plan were formulated together and I agree with the documentation in the resident's note.  

## 2017-01-29 ENCOUNTER — Other Ambulatory Visit: Payer: Self-pay

## 2017-01-29 NOTE — Telephone Encounter (Signed)
Requesting to speak with a nurse about pain meds. Please call pt back.

## 2017-02-05 MED ORDER — OXYCODONE-ACETAMINOPHEN 5-325 MG PO TABS: 1.0000 | ORAL_TABLET | Freq: Four times a day (QID) | ORAL | 0 refills | Status: DC | PRN

## 2017-02-05 NOTE — Telephone Encounter (Signed)
Will give a single refill of 20 tab, but no further until she follows up with me or podiatry.  Will come to clinic to fill this afternoon.

## 2017-02-05 NOTE — Telephone Encounter (Signed)
Called pt - stated she had feet surgery, which did not correct her problem and she's having pain and trouble walking. Tramadol does not help with the pain. Requesting rx for Percocet. Thanks

## 2017-02-06 ENCOUNTER — Inpatient Hospital Stay (HOSPITAL_COMMUNITY): Admission: RE | Admit: 2017-02-06 | Payer: Medicare Other | Source: Ambulatory Visit

## 2017-02-18 ENCOUNTER — Encounter: Payer: Medicare Other | Admitting: Internal Medicine

## 2017-02-19 ENCOUNTER — Ambulatory Visit (HOSPITAL_COMMUNITY)
Admission: RE | Admit: 2017-02-19 | Payer: Medicare Other | Source: Ambulatory Visit | Attending: Cardiovascular Disease | Admitting: Cardiovascular Disease

## 2017-02-25 ENCOUNTER — Encounter: Payer: Self-pay | Admitting: Internal Medicine

## 2017-02-25 ENCOUNTER — Ambulatory Visit (INDEPENDENT_AMBULATORY_CARE_PROVIDER_SITE_OTHER): Payer: Medicare Other | Admitting: Internal Medicine

## 2017-02-25 VITALS — BP 123/78 | HR 78 | Temp 97.9°F | Ht 64.0 in | Wt 137.1 lb

## 2017-02-25 DIAGNOSIS — H9193 Unspecified hearing loss, bilateral: Secondary | ICD-10-CM

## 2017-02-25 DIAGNOSIS — H9190 Unspecified hearing loss, unspecified ear: Secondary | ICD-10-CM

## 2017-02-25 DIAGNOSIS — M79671 Pain in right foot: Secondary | ICD-10-CM | POA: Diagnosis not present

## 2017-02-25 DIAGNOSIS — I739 Peripheral vascular disease, unspecified: Secondary | ICD-10-CM | POA: Diagnosis not present

## 2017-02-25 DIAGNOSIS — R062 Wheezing: Secondary | ICD-10-CM

## 2017-02-25 DIAGNOSIS — Z48815 Encounter for surgical aftercare following surgery on the digestive system: Secondary | ICD-10-CM

## 2017-02-25 DIAGNOSIS — I129 Hypertensive chronic kidney disease with stage 1 through stage 4 chronic kidney disease, or unspecified chronic kidney disease: Secondary | ICD-10-CM

## 2017-02-25 DIAGNOSIS — Z885 Allergy status to narcotic agent status: Secondary | ICD-10-CM

## 2017-02-25 DIAGNOSIS — G8929 Other chronic pain: Secondary | ICD-10-CM | POA: Diagnosis not present

## 2017-02-25 DIAGNOSIS — N183 Chronic kidney disease, stage 3 unspecified: Secondary | ICD-10-CM

## 2017-02-25 DIAGNOSIS — K819 Cholecystitis, unspecified: Secondary | ICD-10-CM | POA: Diagnosis not present

## 2017-02-25 DIAGNOSIS — L84 Corns and callosities: Secondary | ICD-10-CM | POA: Diagnosis not present

## 2017-02-25 DIAGNOSIS — F17210 Nicotine dependence, cigarettes, uncomplicated: Secondary | ICD-10-CM

## 2017-02-25 DIAGNOSIS — Z955 Presence of coronary angioplasty implant and graft: Secondary | ICD-10-CM

## 2017-02-25 DIAGNOSIS — M79672 Pain in left foot: Secondary | ICD-10-CM

## 2017-02-25 DIAGNOSIS — R0989 Other specified symptoms and signs involving the circulatory and respiratory systems: Secondary | ICD-10-CM

## 2017-02-25 DIAGNOSIS — H547 Unspecified visual loss: Secondary | ICD-10-CM

## 2017-02-25 DIAGNOSIS — J8410 Pulmonary fibrosis, unspecified: Secondary | ICD-10-CM | POA: Diagnosis not present

## 2017-02-25 DIAGNOSIS — Z9049 Acquired absence of other specified parts of digestive tract: Secondary | ICD-10-CM

## 2017-02-25 DIAGNOSIS — Z79899 Other long term (current) drug therapy: Secondary | ICD-10-CM

## 2017-02-25 DIAGNOSIS — Z7902 Long term (current) use of antithrombotics/antiplatelets: Secondary | ICD-10-CM

## 2017-02-25 DIAGNOSIS — I10 Essential (primary) hypertension: Secondary | ICD-10-CM

## 2017-02-25 DIAGNOSIS — Z974 Presence of external hearing-aid: Secondary | ICD-10-CM

## 2017-02-25 MED ORDER — HYDROCODONE-ACETAMINOPHEN 2.5-325 MG PO TABS: 1.0000 | ORAL_TABLET | Freq: Two times a day (BID) | ORAL | 0 refills | Status: DC | PRN

## 2017-02-25 NOTE — Patient Instructions (Signed)
[name] - -  Thank you for coming in to see me today.   For your foot pain, please try hydrocodone-acetaminophen once a day to see if this helps.  You can take it up to twice per day.  Side effects include drowsiness, constipation.  Please try this medication the first time when you are home and do not plan to do anything strenuous.  More information below.   Please continue taking your other medications and follow up with Surgery for your gallbladder.   Thank you.  If you feel the pain medication is working for you, you will need to come back in 1 month to be seen.  There will be no refills over the phone.   Thank you!  Acetaminophen; Hydrocodone tablets or capsules What is this medicine? ACETAMINOPHEN; HYDROCODONE (a set a MEE noe fen; hye droe KOE done) is a pain reliever. It is used to treat moderate to severe pain. This medicine may be used for other purposes; ask your health care provider or pharmacist if you have questions. COMMON BRAND NAME(S): Anexsia, Bancap HC, Ceta-Plus, Co-Gesic, Comfortpak, Dolagesic, [employer], DuoCet, Hydrocet, Hydrogesic, [place], Lorcet HD, Lorcet Plus, Lortab, Margesic H, Maxidone, [place], Polygesic, [place], [place], [place], Vicodin, Vicodin ES, Vicodin HP, Charlane Ferretti What should I tell my health care provider before I take this medicine? They need to know if you have any of these conditions: -brain tumor -Crohn's disease, inflammatory bowel disease, or ulcerative colitis -drug abuse or addiction -head injury -heart or circulation problems -if you often drink alcohol -kidney disease or problems going to the bathroom -liver disease -lung disease, asthma, or breathing problems -an unusual or allergic reaction to acetaminophen, hydrocodone, other opioid analgesics, other medicines, foods, dyes, or preservatives -pregnant or trying to get pregnant -breast-feeding How should I use this medicine? Take this medicine by mouth with a glass of  water. Follow the directions on the prescription label. You can take it with or without food. If it upsets your stomach, take it with food. Do not take your medicine more often than directed. A special MedGuide will be given to you by the pharmacist with each prescription and refill. Be sure to read this information carefully each time. Talk to your pediatrician regarding the use of this medicine in children. Special care may be needed. Overdosage: If you think you have taken too much of this medicine contact a poison control center or emergency room at once. NOTE: This medicine is only for you. Do not share this medicine with others. What if I miss a dose? If you miss a dose, take it as soon as you can. If it is almost time for your next dose, take only that dose. Do not take double or extra doses. What may interact with this medicine? This medicine may interact with the following medications: -alcohol -antiviral medicines for HIV or AIDS -atropine -antihistamines for allergy, cough and cold -certain antibiotics like erythromycin, clarithromycin -certain medicines for anxiety or sleep -certain medicines for bladder problems like oxybutynin, tolterodine -certain medicines for depression like amitriptyline, fluoxetine, sertraline -certain medicines for fungal infections like ketoconazole and itraconazole -certain medicines for Parkinson's disease like benztropine, trihexyphenidyl -certain medicines for seizures like carbamazepine, phenobarbital, phenytoin, primidone -certain medicines for stomach problems like dicyclomine, hyoscyamine -certain medicines for travel sickness like scopolamine -general anesthetics like halothane, isoflurane, methoxyflurane, propofol -ipratropium -local anesthetics like lidocaine, pramoxine, tetracaine -MAOIs like Carbex, Eldepryl, Marplan, Nardil, and Parnate -medicines that relax muscles for surgery -other medicines with acetaminophen -  other narcotic  medicines for pain or cough -phenothiazines like chlorpromazine, mesoridazine, prochlorperazine, thioridazine -rifampin This list may not describe all possible interactions. Give your health care provider a list of all the medicines, herbs, non-prescription drugs, or dietary supplements you use. Also tell them if you smoke, drink alcohol, or use illegal drugs. Some items may interact with your medicine. What should I watch for while using this medicine? Tell your doctor or health care professional if your pain does not go away, if it gets worse, or if you have new or a different type of pain. You may develop tolerance to the medicine. Tolerance means that you will need a higher dose of the medicine for pain relief. Tolerance is normal and is expected if you take the medicine for a long time. Do not suddenly stop taking your medicine because you may develop a severe reaction. Your body becomes used to the medicine. This does NOT mean you are addicted. Addiction is a behavior related to getting and using a drug for a non-medical reason. If you have pain, you have a medical reason to take pain medicine. Your doctor will tell you how much medicine to take. If your doctor wants you to stop the medicine, the dose will be slowly lowered over time to avoid any side effects. There are different types of narcotic medicines (opiates). If you take more than one type at the same time or if you are taking another medicine that also causes drowsiness, you may have more side effects. Give your health care provider a list of all medicines you use. Your doctor will tell you how much medicine to take. Do not take more medicine than directed. Call emergency for help if you have problems breathing or unusual sleepiness. Do not take other medicines that contain acetaminophen with this medicine. Always read labels carefully. If you have questions, ask your doctor or pharmacist. If you take too much acetaminophen get medical help  right away. Too much acetaminophen can be very dangerous and cause liver damage. Even if you do not have symptoms, it is important to get help right away. You may get drowsy or dizzy. Do not drive, use machinery, or do anything that needs mental alertness until you know how this medicine affects you. Do not stand or sit up quickly, especially if you are an older patient. This reduces the risk of dizzy or fainting spells. Alcohol may interfere with the effect of this medicine. Avoid alcoholic drinks. The medicine will cause constipation. Try to have a bowel movement at least every 2 to 3 days. If you do not have a bowel movement for 3 days, call your doctor or health care professional. Your mouth may get dry. Chewing sugarless gum or sucking hard candy, and drinking plenty of water may help. Contact your doctor if the problem does not go away or is severe. What side effects may I notice from receiving this medicine? Side effects that you should report to your doctor or health care professional as soon as possible: -allergic reactions like skin rash, itching or hives, swelling of the face, lips, or tongue -breathing problems -confusion -redness, blistering, peeling or loosening of the skin, including inside the mouth -signs and symptoms of low blood pressure like dizziness; feeling faint or lightheaded, falls; unusually weak or tired -trouble passing urine or change in the amount of urine -yellowing of the eyes or skin Side effects that usually do not require medical attention (report to your doctor or health care professional  if they continue or are bothersome): -constipation -dry mouth -nausea, vomiting -tiredness This list may not describe all possible side effects. Call your doctor for medical advice about side effects. You may report side effects to FDA at 1-800-FDA-1088. Where should I keep my medicine? Keep out of the reach of children. This medicine can be abused. Keep your medicine in a  safe place to protect it from theft. Do not share this medicine with anyone. Selling or giving away this medicine is dangerous and against the law. This medicine may cause accidental overdose and death if it taken by other adults, children, or pets. Mix any unused medicine with a substance like cat litter or coffee grounds. Then throw the medicine away in a sealed container like a sealed bag or a coffee can with a lid. Do not use the medicine after the expiration date. Store at room temperature between 15 and 30 degrees C (59 and 86 degrees F). NOTE: This sheet is a summary. It may not cover all possible information. If you have questions about this medicine, talk to your doctor, pharmacist, or health care provider.  2018 Elsevier/Gold Standard (2015-01-19 10:02:16)

## 2017-02-25 NOTE — Assessment & Plan Note (Signed)
This was the main issue we discussed today.  She is following with podiatry for trimming of calluses.  She continues to have pain with walking.  She reports that the percocet she received after surgery helped the most for her.    Plan Trial of low dose (2.5mg ) hydrocodone-acetaminophen.  She was advised of side effects of this medication.  RTC in 1 month for evaluation and to sign pain contract NO REFILLS over the phone.

## 2017-02-25 NOTE — Assessment & Plan Note (Signed)
She is wearing bilateral hearing aids, despite this is very hard of hearing.    Plan Continue hearing aids, follow up with audiology PRN

## 2017-02-25 NOTE — Assessment & Plan Note (Signed)
Doing well today. She has good pulses in her feet.  She has no pain or cramping in her legs.   Plan Continue plavix and atorvastatin, along with good BP control.

## 2017-02-25 NOTE — Assessment & Plan Note (Signed)
Appears to have stabilized post op, but not checked since that time.  She has no signs of uremia today.   Plan Check CMET today.

## 2017-02-25 NOTE — Assessment & Plan Note (Signed)
BP is very well controlled today.  She is taking her HCTZ without issue.   Plan CMET today Continue HCTZ

## 2017-02-25 NOTE — Assessment & Plan Note (Signed)
She is doing well post op.  Denies abdominal pain, swelling, nausea, vomiting. She has had decreased PO intake, but notes this was due to food limitations with loss of power.    Plan Follow up with Surgery as planned

## 2017-02-25 NOTE — Progress Notes (Signed)
Subjective:    Patient ID: [name], female    DOB: [birth year], 76 y.o.   MRN: [id number]  CC: 2 month follow up p gallbladder surgery.   HPI  [name] is a 76yo woman with PMH of deafness, blindness, PVD, CKD, HTN who presents for follow up after surgery and for her BP.   She has chronic foot pain which has been worked up and has been difficult to control.  It is difficult to pin down her exact symptom.  Today she notes that she has severe point tenderness at calluses because of bone issues which cannot be corrected per her.  She has trouble walking because of the pain.  She denies leg pain, cramping or tingling/numbness.  She has taken tramadol, but is cutting them in half to make them last.  She received a small prescription of percocet after her gallbladder surgery which she reports helped very well. She has an adverse reaction reported to morphine and codeine, making her feel high.    Her BP is well controlled today.  She notes that she has lost weight.  This was unintentional, but was related to being out of power and being without food for a few days.  She is eating well, denies nausea, vomiting or dysphagia.    She reports to me being told she had masses in her chest many years ago, but no one has followed up on this.  I reviewed the imaging available and it does not appear she has ever had a CT of the chest.  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 CXRs from what I could see did not note nodularity.  She is an active smoker, at least 20 years, but never more than 6-7 cigarettes per day per her.  Based on this, she would not meet criteria for low dose CT scanning of the chest.  I reviewed her MMG reports, last in 2015 but the report is not available.  She reports no breast symptoms.  She will need a mammogram.   [name] is always in a rush to leave.  She usually has a ride and today is getting picked up by the [employer] bus.  She did not wish to discuss  mammogram today.  Review of Systems  Constitutional: Positive for unexpected weight change. Negative for fatigue and fever.  HENT: Positive for hearing loss.   Eyes: Positive for visual disturbance.  Respiratory: Negative for cough and shortness of breath.   Cardiovascular: Negative for chest pain and palpitations.  Musculoskeletal: Negative for arthralgias, back pain and gait problem.       Foot pain, worse on the right  Neurological: Negative for dizziness and weakness.       Objective:   Physical Exam  Constitutional: She is oriented to person, place, and time.  Elderly woman in NAD.  She has hearing aids in place, is hard of hearing.   Eyes: Conjunctivae are normal. No scleral icterus.  She has dilated pupils which are minimally reactive to light.   Cardiovascular: Normal rate, regular rhythm and normal heart sounds.   No murmur heard. Pulmonary/Chest:  She has very course breath sounds throughout with some rales at the bases.  She has upper airway wheezing.   Abdominal: Soft. Bowel sounds are normal. She exhibits no distension.  She has well healed cholecystectomy scars on her abdomen.  No drainage or erythema.   Neurological: She is alert and oriented to person, place,  and time.  Psychiatric: She has a normal mood and affect. Her behavior is normal.  Vitals reviewed.   CMET and CBC today.       Assessment & Plan:  RTC in 1 month for pain contract.

## 2017-02-26 LAB — CMP14 + ANION GAP
A/G RATIO: 1.6 (ref 1.2–2.2)
ALT: 10 IU/L (ref 0–32)
AST: 12 IU/L (ref 0–40)
Albumin: 4.2 g/dL (ref 3.5–4.8)
Alkaline Phosphatase: 104 IU/L (ref 39–117)
Anion Gap: 19 mmol/L — ABNORMAL HIGH (ref 10.0–18.0)
BUN/Creatinine Ratio: 19 (ref 12–28)
BUN: 37 mg/dL — AB (ref 8–27)
Bilirubin Total: 0.6 mg/dL (ref 0.0–1.2)
CALCIUM: 10 mg/dL (ref 8.7–10.3)
CHLORIDE: 100 mmol/L (ref 96–106)
CO2: 22 mmol/L (ref 20–29)
Creatinine, Ser: 1.94 mg/dL — ABNORMAL HIGH (ref 0.57–1.00)
GFR, EST AFRICAN AMERICAN: 28 mL/min/{1.73_m2} — AB (ref >59)
GFR, EST NON AFRICAN AMERICAN: 25 mL/min/{1.73_m2} — AB (ref >59)
GLUCOSE: 98 mg/dL (ref 65–99)
Globulin, Total: 2.7 g/dL (ref 1.5–4.5)
Potassium: 4.6 mmol/L (ref 3.5–5.2)
Sodium: 141 mmol/L (ref 134–144)
TOTAL PROTEIN: 6.9 g/dL (ref 6.0–8.5)

## 2017-02-26 LAB — CBC
HEMATOCRIT: 42.8 % (ref 34.0–46.6)
HEMOGLOBIN: 14.2 g/dL (ref 11.1–15.9)
MCH: 31.4 pg (ref 26.6–33.0)
MCHC: 33.2 g/dL (ref 31.5–35.7)
MCV: 95 fL (ref 79–97)
Platelets: 245 10*3/uL (ref 150–379)
RBC: 4.52 x10E6/uL (ref 3.77–5.28)
RDW: 14 % (ref 12.3–15.4)
WBC: 6.5 10*3/uL (ref 3.4–10.8)

## 2017-03-02 ENCOUNTER — Ambulatory Visit (INDEPENDENT_AMBULATORY_CARE_PROVIDER_SITE_OTHER): Payer: Medicare Other | Admitting: Podiatry

## 2017-03-02 ENCOUNTER — Telehealth: Payer: Self-pay | Admitting: Internal Medicine

## 2017-03-02 ENCOUNTER — Encounter: Payer: Self-pay | Admitting: Podiatry

## 2017-03-02 DIAGNOSIS — Q828 Other specified congenital malformations of skin: Secondary | ICD-10-CM | POA: Diagnosis not present

## 2017-03-02 DIAGNOSIS — M79676 Pain in unspecified toe(s): Secondary | ICD-10-CM

## 2017-03-02 DIAGNOSIS — B351 Tinea unguium: Secondary | ICD-10-CM

## 2017-03-02 DIAGNOSIS — I739 Peripheral vascular disease, unspecified: Secondary | ICD-10-CM | POA: Diagnosis not present

## 2017-03-02 NOTE — Progress Notes (Signed)
Patient ID: [name], female   DOB: 04/10/1941, 76 y.o.   MRN: [id number]    Subjective: This patient presents for scheduled visit complaining of thickened elongated toenails or cough walking wearing shoes and request toenail debridement. Also, patient complaining of extremely painful plantar calluses left greater than right  Objective:  hard of hearing Orientated 3 DP pulses 1/4 bilaterally PT pulses 0/4 bilaterally Capillary reflex delay bilaterally Sensation to 10 g monofilament wire intact 5/5 bilaterally Ankle reflex reactive bilaterally No open skin lesions bilaterally Absent toenails 1, 2, 3 left The remaining toenails elongated, brittle, deformed, discolored and tender to palpation Multiple surgical scars dorsal aspect right and left feet Atrophic skin with absent hair growth bilaterally Manual motor testing dorsi flexion, plantar flexion 5/5 bilaterally Nucleated plantar calluses plantar right and left  Assessment: Peripheral arterial disease Symptomatic Mycotic toenails 7 Porokeratosis 2  Plan: Debridement toenails 7 mechanically and electrically without any bleeding Debride plantar lesions 2 without any bleeding Apply salinocaine tube plantar lesions right and left  Reappoint 3 months
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
## 2017-03-02 NOTE — Progress Notes (Signed)
Patient ID: [name], female   DOB: 11-11-1940, 76 y.o.   MRN: [id number]

## 2017-03-05 ENCOUNTER — Telehealth: Payer: Self-pay | Admitting: Internal Medicine

## 2017-03-05 NOTE — Telephone Encounter (Signed)
Pt calls and states she has not been able to get her pain med that dr Daryll Drown ordered, called pharm, it is an unusual dose and they had to order it, it will most likely not be here til next week, possibly 10/31, I spoke w/ other pharmacies trying to locate it and one pharmacist suggested 5/325 cutting in half and adding a tylenol with the 1/2. Pt states her pain is "bad". Would this suggestion be possible Please advise

## 2017-03-05 NOTE — Telephone Encounter (Signed)
Patient would like someone regarding her medicine

## 2017-03-06 MED ORDER — HYDROCODONE-ACETAMINOPHEN 5-325 MG PO TABS: 0.5000 | ORAL_TABLET | Freq: Two times a day (BID) | ORAL | 0 refills | Status: DC | PRN

## 2017-03-06 NOTE — Telephone Encounter (Signed)
Called patient and explained she will need to cut the tablet in half on the hydrocodone, also provided education on safe use of acetaminophen if needed additionally for pain. Patient verbalized understanding.

## 2017-03-06 NOTE — Telephone Encounter (Signed)
I spoke with Dr. Maudie Mercury and it is safe to cut the hydrocodone/acetaminophen in half. I will prescribe her hydrocodone/acetaminophen 5/325 mg and instruct her to take half tablet twice a day as needed for pain. I will route this to Dr. Daryll Drown as well

## 2017-03-06 NOTE — Telephone Encounter (Signed)
I am unsure if this pill can be cut in half. Will ask Dr. Maudie Mercury her suggestions. I have added her on to this thread

## 2017-03-06 NOTE — Telephone Encounter (Signed)
Called [facility] cancelled first script for hydrocodone/acetaminophen Called pt reiterated instructions by dr Maudie Mercury Will send script to [facility], pt has decided to transfer her meds to [facility] due to the fact they deliver for free

## 2017-03-09 NOTE — Telephone Encounter (Signed)
rtc to pt, will check w/ dr Maudie Mercury

## 2017-03-09 NOTE — Telephone Encounter (Signed)
Patient calling again about her medication refill.  Pt does not understand where her medication is at or what to do and would like for someone to call her back.

## 2017-03-09 NOTE — Telephone Encounter (Signed)
Pharm delivered today, pharmacist spoke w/ pt

## 2017-03-24 ENCOUNTER — Ambulatory Visit: Payer: Medicare Other | Admitting: Podiatry

## 2017-03-24 ENCOUNTER — Other Ambulatory Visit: Payer: Self-pay

## 2017-03-24 NOTE — Telephone Encounter (Signed)
Unfortunately, I made very clear to [name] that she would not be getting any refills of pain medications without an in person visit.  She had a Prescription filled on 10/26 which was meant to last 1 month.  She will need to be seen before any refills can be given.  Given my limited availability, she can be seen in [facility], but this is not preferred.  If I have an 1115 in the upcoming weeks, please fit her in.    Thank you!

## 2017-03-24 NOTE — Telephone Encounter (Signed)
Have called  Twice, first time she hung up w/o saying anything the next time she answered but stated she was on scat and would call back.

## 2017-03-24 NOTE — Telephone Encounter (Signed)
HYDROcodone-acetaminophen (NORCO/VICODIN) 5-325 MG tablet  oxyCODONE-acetaminophen (PERCOCET/ROXICET) 5-325 MG tablet, refill request. Also requesting BP meds to be filled.   Per patient she is using [facility].

## 2017-03-25 ENCOUNTER — Ambulatory Visit: Payer: Medicare Other

## 2017-03-25 ENCOUNTER — Other Ambulatory Visit: Payer: Self-pay | Admitting: Internal Medicine

## 2017-03-25 MED ORDER — HYDROCHLOROTHIAZIDE 12.5 MG PO CAPS: 12.5000 mg | ORAL_CAPSULE | Freq: Every day | ORAL | 6 refills | Status: DC

## 2017-03-25 MED ORDER — CLOPIDOGREL BISULFATE 75 MG PO TABS: 75.0000 mg | ORAL_TABLET | Freq: Every day | ORAL | 12 refills | Status: DC

## 2017-03-25 NOTE — Telephone Encounter (Signed)
Pt calling back about her prescriptions for her Hydrocodone and her BP medication.  Please call back

## 2017-03-25 NOTE — Telephone Encounter (Signed)
Informed pain meds can not be refilled until being seen. Appt in [facility] scheduled 03/26/17 @ 9:15 am. Only have 1 tab HCTZ & Plavix. Sending request.

## 2017-03-25 NOTE — Telephone Encounter (Signed)
Refill Request.  

## 2017-03-26 ENCOUNTER — Ambulatory Visit (INDEPENDENT_AMBULATORY_CARE_PROVIDER_SITE_OTHER): Payer: Medicare Other | Admitting: Internal Medicine

## 2017-03-26 ENCOUNTER — Encounter: Payer: Self-pay | Admitting: Internal Medicine

## 2017-03-26 VITALS — BP 139/79 | HR 82 | Temp 98.2°F | Wt 140.2 lb

## 2017-03-26 DIAGNOSIS — J449 Chronic obstructive pulmonary disease, unspecified: Secondary | ICD-10-CM

## 2017-03-26 DIAGNOSIS — M67442 Ganglion, left hand: Secondary | ICD-10-CM | POA: Diagnosis not present

## 2017-03-26 DIAGNOSIS — Z760 Encounter for issue of repeat prescription: Secondary | ICD-10-CM

## 2017-03-26 DIAGNOSIS — N183 Chronic kidney disease, stage 3 (moderate): Secondary | ICD-10-CM | POA: Diagnosis not present

## 2017-03-26 DIAGNOSIS — M79671 Pain in right foot: Secondary | ICD-10-CM

## 2017-03-26 DIAGNOSIS — M67449 Ganglion, unspecified hand: Secondary | ICD-10-CM | POA: Insufficient documentation

## 2017-03-26 DIAGNOSIS — I129 Hypertensive chronic kidney disease with stage 1 through stage 4 chronic kidney disease, or unspecified chronic kidney disease: Secondary | ICD-10-CM | POA: Diagnosis not present

## 2017-03-26 DIAGNOSIS — M79672 Pain in left foot: Secondary | ICD-10-CM

## 2017-03-26 MED ORDER — HYDROCODONE-ACETAMINOPHEN 5-325 MG PO TABS: 0.5000 | ORAL_TABLET | Freq: Two times a day (BID) | ORAL | 0 refills | Status: DC | PRN

## 2017-03-26 NOTE — Patient Instructions (Signed)
It was a pleasure to see you today [name].  I have printed a prescription for your pain medicine. I really would prefer you discuss a change in the dose long term with Dr. Daryll Drown. For now we will reorder the previous dose. If you have to take this higher than prescribed we will have to see you again sooner before adjusting it.  Your finger has a ganglion cyst. These are generally self limited and do not need any procedure. Do not try lancing it yourself as this can risk infection.

## 2017-03-26 NOTE — Progress Notes (Signed)
   CC: Needs medication refill for bilateral foot pain  HPI:  [name] is a 76 y.o. female with PMHx detailed below presenting with bilateral R>L foot pain needing medication refills. She also has a new ganglion cyst on the left hand.  See problem based assessment and plan below for additional details.  Bilateral foot pain She continues to have significant bilateral foot pain R>L. She has seen podiatry for her calluses and onychomycoses. She is able to travel to the mailbox and back but otherwise spends most of her time seated or in bed. She is taking the medication as a 5mg  dose twice daily rather than 2.5mg  as discussed with Dr. Daryll Drown at her previous office visit. She may need this higher dose long term and the total daily dose is still very low. Plan Reorder hydrocodone-acteminophen 5-325mg  take 1/2 tablet BID PRN for foot pain- #30 tablets Recommended she schedule an appointment with Dr. Daryll Drown to discuss long term change in her pain medicine dosing  Ganglion cyst of finger She has a new uncomplicated ganglion cyst on the left thumb. This is somewhat tender to touch and she does not recall having similar cysts before. I recommended against puncturing or cutting this cyst because this could lead to infection and is likely to recur. She can return to clinic for this if it bothers her much worse.    Past Medical History:  Diagnosis Date  . Arthritis    "left shoulder" (04/07/2016)  . Blind    s/p bilateral corneal transplant; "legally blind in both eyes" (04/07/2016)  . CKD (chronic kidney disease), stage III ([place])   . COPD (chronic obstructive pulmonary disease) ([place])   . DVT (deep venous thrombosis) ([place]) "years ago"   "right thigh"    . HEARING LOSS   . Hyperlipidemia   . Hypertension   . PAD (peripheral artery disease) ([place])    a. 03/2016 Periph Angio: L SFA 99p, 69m w/ one vessel runoff. R SFA 158m w/ one vessel runofff;  b. 04/07/2016 PTA L SFA (Hawk 1 LS  atherecromty w/ DEBA). c. 04/24/16 s/p PTA to R SFA, right tibioperoneal trunk and posterior tibial artery.  . Phlebitis   . Tobacco abuse     Review of Systems: Review of Systems  HENT: Positive for hearing loss.   Cardiovascular: Positive for claudication.  Gastrointestinal: Negative for constipation and nausea.  Musculoskeletal: Positive for joint pain. Negative for falls.  Skin: Negative for itching and rash.  Neurological: Negative for dizziness.     Physical Exam: Vitals:   03/26/17 0915  BP: 139/79  Pulse: 82  Temp: 98.2 F (36.8 C)  TempSrc: Oral  SpO2: 99%  Weight: 140 lb 3.2 oz (63.6 kg)   GENERAL- alert, co-operative, NAD HEENT- Hearing aids in place, pupils weakly reactive to light, slight horizontal gaze bilateral nystagmus CARDIAC- RRR, no murmurs, rubs or gallops. RESP- Faint basilar crackles on inspiration EXTREMITIES- ganglion cyst at the DIP joint of her left thumb, feet show numerous calluses and onychomycotic nail changes on both feet, skin is waxy and loose, DP pulses were difficult to palpate SKIN- Warm, dry, No rash or lesion.    Assessment & Plan:   See encounters tab for problem based medical decision making.   Patient discussed with Dr. Rebeca Alert

## 2017-03-26 NOTE — Assessment & Plan Note (Addendum)
She continues to have significant bilateral foot pain R>L. She has seen podiatry for her calluses and onychomycoses. She is able to travel to the mailbox and back but otherwise spends most of her time seated or in bed. She is taking the medication as a 5mg  dose twice daily rather than 2.5mg  as discussed with Dr. Daryll Drown at her previous office visit. She may need this higher dose long term and the total daily dose is still very low. Plan Reorder hydrocodone-acteminophen 5-325mg  take 1/2 tablet BID PRN for foot pain- #30 tablets Recommended she schedule an appointment with Dr. Daryll Drown to discuss long term change in her pain medicine dosing

## 2017-03-29 NOTE — Assessment & Plan Note (Signed)
She has a new uncomplicated ganglion cyst on the left thumb. This is somewhat tender to touch and she does not recall having similar cysts before. I recommended against puncturing or cutting this cyst because this could lead to infection and is likely to recur. She can return to clinic for this if it bothers her much worse.

## 2017-03-30 ENCOUNTER — Telehealth: Payer: Self-pay | Admitting: Internal Medicine

## 2017-04-01 NOTE — Telephone Encounter (Signed)
Empty request 

## 2017-04-08 ENCOUNTER — Other Ambulatory Visit: Payer: Self-pay | Admitting: Internal Medicine

## 2017-04-08 NOTE — Telephone Encounter (Signed)
Patient would like for someone to call her back regarding her meds

## 2017-04-08 NOTE — Telephone Encounter (Signed)
Called pt, she ask about cholesterol medicine stating the doctor that operated on her said she should take it, I encouraged her to speak w/ dr Daryll Drown at appt about adding a statin, she was agreeable She then ask for an appt for a boil she has on the inside of  her buttocks, she states it is very sore and she is worried about it she also mentions that she has a "boil" on her thumb and it is bothersome appt made for pt's desired time of tues 12/4 .

## 2017-04-09 ENCOUNTER — Telehealth: Payer: Self-pay | Admitting: *Deleted

## 2017-04-09 NOTE — Telephone Encounter (Signed)
I have completed my part of the paperwork and placed in box on Chilon's desk.  Thanks

## 2017-04-09 NOTE — Telephone Encounter (Signed)
Call from pt's friend - wanting to know if form DMA 30/50 had been completed by Dr Daryll Drown. Informed I needed to check to see if it had been received. Per front office, form was received yesterday and is in Dr [employer]  box to be completed.

## 2017-04-10 ENCOUNTER — Ambulatory Visit (INDEPENDENT_AMBULATORY_CARE_PROVIDER_SITE_OTHER): Payer: Medicare Other | Admitting: Internal Medicine

## 2017-04-10 VITALS — BP 131/72 | HR 81 | Temp 98.2°F | Wt 139.3 lb

## 2017-04-10 DIAGNOSIS — L89301 Pressure ulcer of unspecified buttock, stage 1: Secondary | ICD-10-CM | POA: Diagnosis not present

## 2017-04-10 NOTE — Progress Notes (Signed)
Internal Medicine Clinic Attending  Case discussed with Dr. Benjamine Mola  at the time of the visit.  We reviewed the resident's history and exam and pertinent patient test results.  I agree with the assessment, diagnosis, and plan of care documented in the resident's note.  Oda Kilts, MD

## 2017-04-10 NOTE — Progress Notes (Signed)
   CC: Painful boil.  HPI:  [name] is a 76 y.o. female with a past medical history listed below here today with complaints of a painful boil.  She reports that 5 days ago she developed pain in her gluteal region. She felt something hard that was painful to touch and thought it to be a boil developing. The pain has continued and she noted some clear fluid leaking out yesterday and came today to be evaluated. She denies any fevers or chills. Reports a history of recurrent skin infections. Reports that it is painful to sit. She notes that she spends most of her time sitting in a chair at home. She has not noted any further drainage from the site.   Past Medical History:  Diagnosis Date  . Arthritis    "left shoulder" (04/07/2016)  . Blind    s/p bilateral corneal transplant; "legally blind in both eyes" (04/07/2016)  . CKD (chronic kidney disease), stage III ([place])   . COPD (chronic obstructive pulmonary disease) ([place])   . DVT (deep venous thrombosis) ([place]) "years ago"   "right thigh"    . HEARING LOSS   . Hyperlipidemia   . Hypertension   . PAD (peripheral artery disease) ([place])    a. 03/2016 Periph Angio: L SFA 99p, 9m w/ one vessel runoff. R SFA 12m w/ one vessel runofff;  b. 04/07/2016 PTA L SFA (Hawk 1 LS atherecromty w/ DEBA). c. 04/24/16 s/p PTA to R SFA, right tibioperoneal trunk and posterior tibial artery.  . Phlebitis   . Tobacco abuse    Review of Systems:   No chest pain or shortness of breath  Physical Exam:  Vitals:   04/10/17 1313  BP: 131/72  Pulse: 81  Temp: 98.2 F (36.8 C)  TempSrc: Oral  SpO2: 99%  Weight: 139 lb 4.8 oz (63.2 kg)   Physical Exam  Constitutional:  Chronically ill appearing female in no acute distress  HENT:  Head: Normocephalic and atraumatic.  Eyes: Conjunctivae are normal. Pupils are equal, round, and reactive to light.  Cardiovascular: Normal rate and regular rhythm.  Pulmonary/Chest: Effort normal and breath sounds  normal.  Skin: Skin is warm and dry. No erythema.  Several small areas of skin tears in between the gluteal folds. No erythema, warmth, fluctuance. See picture below.  Vitals reviewed.      Assessment & Plan:   See Encounters Tab for problem based charting.  Patient discussed with Dr. Daryll Drown

## 2017-04-10 NOTE — Patient Instructions (Addendum)
Ms. Franko,   You are having skin breakdown in your buttocks area likely from sitting down too much. I would like for you to use the padding we gave you today and try to get up and move more often. We will get you set up for wound care to make sure it is healing properly.   Please follow up with Dr. Daryll Drown in January.

## 2017-04-13 DIAGNOSIS — L89301 Pressure ulcer of unspecified buttock, stage 1: Secondary | ICD-10-CM | POA: Insufficient documentation

## 2017-04-13 NOTE — Progress Notes (Signed)
Completed.

## 2017-04-13 NOTE — Assessment & Plan Note (Signed)
Patient with stage I pressure injury on exam. No evidence of local or systemic infection. Will refer to home health wound care as well as [facility] as patient has several co-morbidites and appears to have a difficult time caring for herself.

## 2017-04-13 NOTE — Progress Notes (Signed)
Internal Medicine Clinic Attending  Case discussed with Dr. Boswell at the time of the visit.  We reviewed the resident's history and exam and pertinent patient test results.  I agree with the assessment, diagnosis, and plan of care documented in the resident's note.  

## 2017-04-14 ENCOUNTER — Ambulatory Visit: Payer: Medicare Other

## 2017-04-15 ENCOUNTER — Other Ambulatory Visit: Payer: Self-pay

## 2017-04-15 ENCOUNTER — Emergency Department (HOSPITAL_COMMUNITY)
Admission: EM | Admit: 2017-04-15 | Discharge: 2017-04-15 | Disposition: A | Discharge status: Home / Self Care | Payer: Medicare Other | Attending: Emergency Medicine | Admitting: Emergency Medicine

## 2017-04-15 ENCOUNTER — Encounter (HOSPITAL_COMMUNITY): Payer: Self-pay | Admitting: *Deleted

## 2017-04-15 DIAGNOSIS — L89152 Pressure ulcer of sacral region, stage 2: Secondary | ICD-10-CM | POA: Diagnosis not present

## 2017-04-15 DIAGNOSIS — L0231 Cutaneous abscess of buttock: Secondary | ICD-10-CM | POA: Diagnosis not present

## 2017-04-15 DIAGNOSIS — Z7982 Long term (current) use of aspirin: Secondary | ICD-10-CM | POA: Diagnosis not present

## 2017-04-15 DIAGNOSIS — J449 Chronic obstructive pulmonary disease, unspecified: Secondary | ICD-10-CM | POA: Diagnosis not present

## 2017-04-15 DIAGNOSIS — F17210 Nicotine dependence, cigarettes, uncomplicated: Secondary | ICD-10-CM | POA: Insufficient documentation

## 2017-04-15 DIAGNOSIS — S3992XA Unspecified injury of lower back, initial encounter: Secondary | ICD-10-CM | POA: Diagnosis not present

## 2017-04-15 DIAGNOSIS — R222 Localized swelling, mass and lump, trunk: Secondary | ICD-10-CM | POA: Diagnosis present

## 2017-04-15 DIAGNOSIS — N183 Chronic kidney disease, stage 3 (moderate): Secondary | ICD-10-CM | POA: Diagnosis not present

## 2017-04-15 DIAGNOSIS — Z7902 Long term (current) use of antithrombotics/antiplatelets: Secondary | ICD-10-CM | POA: Diagnosis not present

## 2017-04-15 DIAGNOSIS — Z79899 Other long term (current) drug therapy: Secondary | ICD-10-CM | POA: Insufficient documentation

## 2017-04-15 DIAGNOSIS — I129 Hypertensive chronic kidney disease with stage 1 through stage 4 chronic kidney disease, or unspecified chronic kidney disease: Secondary | ICD-10-CM | POA: Insufficient documentation

## 2017-04-15 DIAGNOSIS — I10 Essential (primary) hypertension: Secondary | ICD-10-CM | POA: Diagnosis not present

## 2017-04-15 MED ORDER — ACETAMINOPHEN 325 MG PO TABS
650.0000 mg | ORAL_TABLET | Freq: Once | ORAL | Status: AC
  Administered 2017-04-15: 650 mg via ORAL
  Filled 2017-04-15: qty 2

## 2017-04-15 MED ORDER — DOXYCYCLINE HYCLATE 100 MG PO TABS
100.0000 mg | ORAL_TABLET | Freq: Once | ORAL | Status: AC
  Administered 2017-04-15: 100 mg via ORAL
  Filled 2017-04-15: qty 1

## 2017-04-15 MED ORDER — LIDOCAINE-EPINEPHRINE (PF) 2 %-1:200000 IJ SOLN
20.0000 mL | Freq: Once | INTRAMUSCULAR | Status: AC
  Administered 2017-04-15: 20 mL
  Filled 2017-04-15: qty 20

## 2017-04-15 MED ORDER — DOXYCYCLINE HYCLATE 100 MG PO CAPS: 100.0000 mg | ORAL_CAPSULE | Freq: Two times a day (BID) | ORAL | 0 refills | Status: DC
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
## 2017-04-15 NOTE — ED Triage Notes (Signed)
Wound to buttock, pt reports increased bloody drainage, swelling and pain since last night. Pt coming from home. Was seen last week for abscess in same area.

## 2017-04-22 DIAGNOSIS — I10 Essential (primary) hypertension: Secondary | ICD-10-CM | POA: Diagnosis not present

## 2017-04-22 DIAGNOSIS — L89152 Pressure ulcer of sacral region, stage 2: Secondary | ICD-10-CM | POA: Diagnosis not present

## 2017-04-29 DIAGNOSIS — I10 Essential (primary) hypertension: Secondary | ICD-10-CM | POA: Diagnosis not present

## 2017-04-29 DIAGNOSIS — L89152 Pressure ulcer of sacral region, stage 2: Secondary | ICD-10-CM | POA: Diagnosis not present

## 2017-04-30 ENCOUNTER — Telehealth: Payer: Self-pay

## 2017-04-30 NOTE — Telephone Encounter (Signed)
Faxed interim hh form @ [phone] on 04/30/2017.

## 2017-05-13 ENCOUNTER — Encounter: Payer: Medicare Other | Admitting: Internal Medicine

## 2017-05-13 ENCOUNTER — Other Ambulatory Visit: Payer: Self-pay

## 2017-05-13 DIAGNOSIS — M79671 Pain in right foot: Secondary | ICD-10-CM

## 2017-05-13 DIAGNOSIS — M79672 Pain in left foot: Principal | ICD-10-CM

## 2017-05-13 NOTE — Telephone Encounter (Signed)
HYDROcodone-acetaminophen (NORCO/VICODIN) 5-325 MG tablet, refill request @ [facility].

## 2017-05-15 ENCOUNTER — Other Ambulatory Visit: Payer: Self-pay | Admitting: Internal Medicine

## 2017-05-15 MED ORDER — HYDROCODONE-ACETAMINOPHEN 5-325 MG PO TABS: 0.5000 | ORAL_TABLET | Freq: Two times a day (BID) | ORAL | 0 refills | Status: DC | PRN

## 2017-05-15 NOTE — Telephone Encounter (Signed)
Patient is wanting a refill on Pain for her foot, she doesn't go to see the foot dr until 01/28 and she is in pain

## 2017-05-15 NOTE — Telephone Encounter (Signed)
Pain med has been approved & sent. Patient informed.

## 2017-06-08 ENCOUNTER — Ambulatory Visit: Payer: Medicare Other | Admitting: Podiatry

## 2017-06-15 DIAGNOSIS — Z947 Corneal transplant status: Secondary | ICD-10-CM | POA: Diagnosis not present

## 2017-06-15 DIAGNOSIS — H53003 Unspecified amblyopia, bilateral: Secondary | ICD-10-CM | POA: Diagnosis not present

## 2017-06-15 DIAGNOSIS — H26493 Other secondary cataract, bilateral: Secondary | ICD-10-CM | POA: Diagnosis not present

## 2017-06-17 ENCOUNTER — Other Ambulatory Visit: Payer: Self-pay

## 2017-06-17 ENCOUNTER — Ambulatory Visit (INDEPENDENT_AMBULATORY_CARE_PROVIDER_SITE_OTHER): Payer: Medicare Other | Admitting: Internal Medicine

## 2017-06-17 ENCOUNTER — Encounter: Payer: Self-pay | Admitting: Internal Medicine

## 2017-06-17 VITALS — BP 114/60 | HR 74 | Temp 98.0°F | Ht 64.0 in | Wt 140.7 lb

## 2017-06-17 DIAGNOSIS — L84 Corns and callosities: Secondary | ICD-10-CM | POA: Diagnosis not present

## 2017-06-17 DIAGNOSIS — M79672 Pain in left foot: Secondary | ICD-10-CM | POA: Diagnosis not present

## 2017-06-17 DIAGNOSIS — M79671 Pain in right foot: Secondary | ICD-10-CM

## 2017-06-17 DIAGNOSIS — I129 Hypertensive chronic kidney disease with stage 1 through stage 4 chronic kidney disease, or unspecified chronic kidney disease: Secondary | ICD-10-CM

## 2017-06-17 DIAGNOSIS — J449 Chronic obstructive pulmonary disease, unspecified: Secondary | ICD-10-CM | POA: Diagnosis not present

## 2017-06-17 DIAGNOSIS — K5900 Constipation, unspecified: Secondary | ICD-10-CM

## 2017-06-17 DIAGNOSIS — N183 Chronic kidney disease, stage 3 unspecified: Secondary | ICD-10-CM

## 2017-06-17 DIAGNOSIS — I739 Peripheral vascular disease, unspecified: Secondary | ICD-10-CM | POA: Diagnosis not present

## 2017-06-17 DIAGNOSIS — Z79891 Long term (current) use of opiate analgesic: Secondary | ICD-10-CM

## 2017-06-17 DIAGNOSIS — H547 Unspecified visual loss: Secondary | ICD-10-CM

## 2017-06-17 DIAGNOSIS — I10 Essential (primary) hypertension: Secondary | ICD-10-CM

## 2017-06-17 DIAGNOSIS — Z79899 Other long term (current) drug therapy: Secondary | ICD-10-CM

## 2017-06-17 DIAGNOSIS — Z0000 Encounter for general adult medical examination without abnormal findings: Secondary | ICD-10-CM

## 2017-06-17 DIAGNOSIS — R2689 Other abnormalities of gait and mobility: Secondary | ICD-10-CM | POA: Diagnosis not present

## 2017-06-17 DIAGNOSIS — E785 Hyperlipidemia, unspecified: Secondary | ICD-10-CM | POA: Diagnosis not present

## 2017-06-17 DIAGNOSIS — H9190 Unspecified hearing loss, unspecified ear: Secondary | ICD-10-CM

## 2017-06-17 DIAGNOSIS — G8929 Other chronic pain: Secondary | ICD-10-CM

## 2017-06-17 DIAGNOSIS — E7800 Pure hypercholesterolemia, unspecified: Secondary | ICD-10-CM

## 2017-06-17 DIAGNOSIS — L89301 Pressure ulcer of unspecified buttock, stage 1: Secondary | ICD-10-CM

## 2017-06-17 DIAGNOSIS — Z7902 Long term (current) use of antithrombotics/antiplatelets: Secondary | ICD-10-CM

## 2017-06-17 DIAGNOSIS — F17210 Nicotine dependence, cigarettes, uncomplicated: Secondary | ICD-10-CM

## 2017-06-17 MED ORDER — HYDROCODONE-ACETAMINOPHEN 5-325 MG PO TABS: 1.0000 | ORAL_TABLET | Freq: Two times a day (BID) | ORAL | 0 refills | Status: DC | PRN

## 2017-06-17 MED ORDER — ATORVASTATIN CALCIUM 40 MG PO TABS: 40.0000 mg | ORAL_TABLET | Freq: Every day | ORAL | 3 refills | Status: DC

## 2017-06-17 MED ORDER — HYDROCODONE-ACETAMINOPHEN 5-325 MG PO TABS: 1.0000 | ORAL_TABLET | Freq: Four times a day (QID) | ORAL | 0 refills | Status: DC | PRN
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
## 2017-06-17 NOTE — Assessment & Plan Note (Signed)
She has somehow stopped receiving her atorvastatin.  I refilled this today.  She has chronic foot pain but no pain in the legs with ambulation.  She follows with [name].   Plan Continue good BP control, plavix, statin

## 2017-06-17 NOTE — Patient Instructions (Signed)
Ms. Fojtik - -  For your peripheral vascular disease, you should start taking your statin again.  I have sent it in to the pharmacy.   For your pain medications, you will have 3 months on file with the pharmacy.  Please call them for refills.   We will order a rollator walker for you.   Thank you!  Come back to see me in 4 months.

## 2017-06-17 NOTE — Assessment & Plan Note (Signed)
Breathing is well controlled today.  She takes albuterol intermittently and reports not needing recently.   Plan Continue albuterol PRN

## 2017-06-17 NOTE — Assessment & Plan Note (Signed)
She has refused all screening at this time including mammogram and colon cancer screening. She is a current smoker and refused lung cancer screening as well.

## 2017-06-17 NOTE — Assessment & Plan Note (Signed)
Bp today is 114/60.  She is on HCTZ with good results.  She has no dizziness or falls.   Plan Continue HCTZ Check BMET today

## 2017-06-17 NOTE — Assessment & Plan Note (Signed)
Much improved today with home health.  Will resolve.  Follow up as needed.

## 2017-06-17 NOTE — Assessment & Plan Note (Signed)
She has not been taking her atorvastatin.  Given her disease process including PVD I think it is important for her to stay on this medication.  Last LDL was 52.   Plan Refill atorvastatin.

## 2017-06-17 NOTE — Assessment & Plan Note (Addendum)
Chronic, due to calluses.  She is on 1/2 hydrocodone-apa 5-325 BID at this time. She would like an increase.  It helps her to walk and maintain her independence in the home.  She is not able to read and see a medication contract.  I went over the contract with her and she verbally agreed to the conditions.   Plan INCREASE to hydrocodone 5-325mg  BID, [place] narcotic database appropriate.  3 Rx sent in.  Rollator walker for balance.

## 2017-06-17 NOTE — Assessment & Plan Note (Signed)
Renal function slightly worse at last check, she had been having issues with getting enough to eat and drink.  Reports her urination is normal.  She cannot see the color of her urine.   Plan Repeat BMET today.

## 2017-06-18 LAB — BMP8+ANION GAP
ANION GAP: 14 mmol/L (ref 10.0–18.0)
BUN/Creatinine Ratio: 15 (ref 12–28)
BUN: 22 mg/dL (ref 8–27)
CHLORIDE: 100 mmol/L (ref 96–106)
CO2: 24 mmol/L (ref 20–29)
CREATININE: 1.44 mg/dL — AB (ref 0.57–1.00)
Calcium: 9.3 mg/dL (ref 8.7–10.3)
GFR calc Af Amer: 40 mL/min/{1.73_m2} — ABNORMAL LOW (ref >59)
GFR calc non Af Amer: 35 mL/min/{1.73_m2} — ABNORMAL LOW (ref >59)
GLUCOSE: 89 mg/dL (ref 65–99)
Potassium: 4.3 mmol/L (ref 3.5–5.2)
Sodium: 138 mmol/L (ref 134–144)

## 2017-06-22 ENCOUNTER — Encounter: Payer: Self-pay | Admitting: Internal Medicine

## 2017-06-22 DIAGNOSIS — M25531 Pain in right wrist: Secondary | ICD-10-CM | POA: Diagnosis not present

## 2017-06-22 DIAGNOSIS — I10 Essential (primary) hypertension: Secondary | ICD-10-CM | POA: Diagnosis not present

## 2017-06-22 DIAGNOSIS — J449 Chronic obstructive pulmonary disease, unspecified: Secondary | ICD-10-CM | POA: Diagnosis not present

## 2017-06-24 ENCOUNTER — Ambulatory Visit: Payer: Medicare Other | Admitting: Podiatry

## 2017-06-26 ENCOUNTER — Telehealth: Payer: Self-pay | Admitting: *Deleted

## 2017-06-26 NOTE — Telephone Encounter (Signed)
appt scheduled in [facility] 2/19 @ 1045 AM (pt will arrive 1000 AM via [place]).

## 2017-06-26 NOTE — Telephone Encounter (Signed)
Thanks Glenda!   Kathlee Nations - This lady will be coming in when you are on.  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  Thank you!

## 2017-06-26 NOTE — Telephone Encounter (Signed)
-----   Message from Sid Falcon, MD sent at 06/17/2017 10:02 AM EST ----- Can you please call Ms. Kayleen Memos and make her an appointment for Seb K removal on left flank.  She will need procedure appointment in [facility] for shave biopsy.   Thanks!

## 2017-06-30 ENCOUNTER — Ambulatory Visit: Payer: Medicare Other

## 2017-07-15 ENCOUNTER — Encounter: Payer: Self-pay | Admitting: Podiatry

## 2017-07-15 ENCOUNTER — Ambulatory Visit (INDEPENDENT_AMBULATORY_CARE_PROVIDER_SITE_OTHER): Payer: Medicare Other | Admitting: Podiatry

## 2017-07-15 DIAGNOSIS — M79676 Pain in unspecified toe(s): Secondary | ICD-10-CM | POA: Diagnosis not present

## 2017-07-15 DIAGNOSIS — Q828 Other specified congenital malformations of skin: Secondary | ICD-10-CM | POA: Diagnosis not present

## 2017-07-15 DIAGNOSIS — B351 Tinea unguium: Secondary | ICD-10-CM | POA: Diagnosis not present

## 2017-07-15 DIAGNOSIS — I739 Peripheral vascular disease, unspecified: Secondary | ICD-10-CM

## 2017-07-15 DIAGNOSIS — M2011 Hallux valgus (acquired), right foot: Secondary | ICD-10-CM

## 2017-07-15 DIAGNOSIS — D689 Coagulation defect, unspecified: Secondary | ICD-10-CM

## 2017-07-15 NOTE — Progress Notes (Signed)
Complaint:  Visit Type: Patient returns to my office for continued preventative foot care services. Complaint: Patient states" my nails have grown long and thick and become painful to walk and wear shoes" Patient has been diagnosed with PVD. The patient presents for preventative foot care services. No changes to ROS.  Patient also says she has painful calluses both feet.  Podiatric Exam: Vascular: dorsalis pedis  pulses are palpable bilateral. Posterior tibial pulses are absent  B/L. Capillary return is immediate. Temperature gradient is WNL. Skin turgor WNL  Sensorium: Normal Semmes Weinstein monofilament test. Normal tactile sensation bilaterally. Nail Exam: Pt has thick disfigured discolored nails with subungual debris noted bilateral entire nail hallux through fifth toenails Ulcer Exam: There is no evidence of ulcer or pre-ulcerative changes or infection. Orthopedic Exam: Muscle tone and strength are WNL. No limitations in general ROM. No crepitus or effusions noted. Digital surgery performed by [facility]. Skin:  Porokeratosis sub 3 right and sub 2 left.  No infection or ulcers.  Callus distal aspect left hallux.  Diagnosis:  Onychomycosis, , Pain in right toe, pain in left toes,  Porokeratosis  B/L  Treatment & Plan Procedures and Treatment: Consent by patient was obtained for treatment procedures.   Debridement of mycotic and hypertrophic toenails, 1 through 5 bilateral and clearing of subungual debris. No ulceration, no infection noted. Debridement of porokeratosis with #15 blade.  Padding was dispensed for left hallux.   Return Visit-Office Procedure: Patient instructed to return to the office for a follow up visit 3 months for continued evaluation and treatment.    Gardiner Barefoot DPM

## 2017-07-16 ENCOUNTER — Telehealth: Payer: Self-pay | Admitting: *Deleted

## 2017-07-16 NOTE — Telephone Encounter (Signed)
Agree with plan. Thank you

## 2017-07-16 NOTE — Telephone Encounter (Signed)
Patient called in stating she took dulcolax suppository by mouth 2 days ago. Had BM 2 hours later. No adverse symptoms except continued constipation. Discussed increasing fluid (water) intake, dietary fiber and exercise. States she will try do this. Patient wanted to make PCP aware. Hubbard Hartshorn, RN, BSN

## 2017-09-11 ENCOUNTER — Emergency Department (HOSPITAL_COMMUNITY): Payer: Medicare Other

## 2017-09-11 ENCOUNTER — Encounter (HOSPITAL_COMMUNITY): Payer: Self-pay | Admitting: Emergency Medicine

## 2017-09-11 ENCOUNTER — Emergency Department (HOSPITAL_COMMUNITY)
Admission: EM | Admit: 2017-09-11 | Discharge: 2017-09-11 | Disposition: A | Discharge status: Home / Self Care | Payer: Medicare Other | Source: Home / Self Care | Attending: Emergency Medicine | Admitting: Emergency Medicine

## 2017-09-11 ENCOUNTER — Other Ambulatory Visit: Payer: Self-pay

## 2017-09-11 DIAGNOSIS — R079 Chest pain, unspecified: Secondary | ICD-10-CM | POA: Diagnosis not present

## 2017-09-11 DIAGNOSIS — Z7902 Long term (current) use of antithrombotics/antiplatelets: Secondary | ICD-10-CM

## 2017-09-11 DIAGNOSIS — J984 Other disorders of lung: Secondary | ICD-10-CM | POA: Diagnosis not present

## 2017-09-11 DIAGNOSIS — J189 Pneumonia, unspecified organism: Secondary | ICD-10-CM

## 2017-09-11 DIAGNOSIS — Z7982 Long term (current) use of aspirin: Secondary | ICD-10-CM | POA: Insufficient documentation

## 2017-09-11 DIAGNOSIS — N183 Chronic kidney disease, stage 3 (moderate): Secondary | ICD-10-CM | POA: Insufficient documentation

## 2017-09-11 DIAGNOSIS — N390 Urinary tract infection, site not specified: Secondary | ICD-10-CM | POA: Insufficient documentation

## 2017-09-11 DIAGNOSIS — Z87891 Personal history of nicotine dependence: Secondary | ICD-10-CM | POA: Insufficient documentation

## 2017-09-11 DIAGNOSIS — R109 Unspecified abdominal pain: Secondary | ICD-10-CM

## 2017-09-11 DIAGNOSIS — J181 Lobar pneumonia, unspecified organism: Secondary | ICD-10-CM | POA: Insufficient documentation

## 2017-09-11 DIAGNOSIS — R0789 Other chest pain: Secondary | ICD-10-CM | POA: Diagnosis not present

## 2017-09-11 DIAGNOSIS — J45909 Unspecified asthma, uncomplicated: Secondary | ICD-10-CM

## 2017-09-11 DIAGNOSIS — N179 Acute kidney failure, unspecified: Secondary | ICD-10-CM | POA: Diagnosis not present

## 2017-09-11 DIAGNOSIS — I129 Hypertensive chronic kidney disease with stage 1 through stage 4 chronic kidney disease, or unspecified chronic kidney disease: Secondary | ICD-10-CM

## 2017-09-11 DIAGNOSIS — R1084 Generalized abdominal pain: Secondary | ICD-10-CM | POA: Diagnosis not present

## 2017-09-11 DIAGNOSIS — Z79899 Other long term (current) drug therapy: Secondary | ICD-10-CM

## 2017-09-11 DIAGNOSIS — N3000 Acute cystitis without hematuria: Secondary | ICD-10-CM | POA: Diagnosis not present

## 2017-09-11 LAB — CBC WITH DIFFERENTIAL/PLATELET
Basophils Absolute: 0.1 10*3/uL (ref 0.0–0.1)
Basophils Relative: 1 %
EOS ABS: 0 10*3/uL (ref 0.0–0.7)
EOS PCT: 0 %
HCT: 32 % — ABNORMAL LOW (ref 36.0–46.0)
Hemoglobin: 10.5 g/dL — ABNORMAL LOW (ref 12.0–15.0)
LYMPHS ABS: 1.4 10*3/uL (ref 0.7–4.0)
Lymphocytes Relative: 13 %
MCH: 30.1 pg (ref 26.0–34.0)
MCHC: 32.8 g/dL (ref 30.0–36.0)
MCV: 91.7 fL (ref 78.0–100.0)
MONO ABS: 1 10*3/uL (ref 0.1–1.0)
MONOS PCT: 9 %
Neutro Abs: 8.1 10*3/uL — ABNORMAL HIGH (ref 1.7–7.7)
Neutrophils Relative %: 77 %
Platelets: 347 10*3/uL (ref 150–400)
RBC: 3.49 MIL/uL — ABNORMAL LOW (ref 3.87–5.11)
RDW: 14.2 % (ref 11.5–15.5)
WBC: 10.5 10*3/uL (ref 4.0–10.5)

## 2017-09-11 LAB — COMPREHENSIVE METABOLIC PANEL
ALBUMIN: 3 g/dL — AB (ref 3.5–5.0)
ALK PHOS: 57 U/L (ref 38–126)
ALT: 7 U/L — ABNORMAL LOW (ref 14–54)
ANION GAP: 13 (ref 5–15)
AST: 11 U/L — ABNORMAL LOW (ref 15–41)
BUN: 27 mg/dL — ABNORMAL HIGH (ref 6–20)
CALCIUM: 9.4 mg/dL (ref 8.9–10.3)
CO2: 24 mmol/L (ref 22–32)
Chloride: 97 mmol/L — ABNORMAL LOW (ref 101–111)
Creatinine, Ser: 1.79 mg/dL — ABNORMAL HIGH (ref 0.44–1.00)
GFR calc non Af Amer: 26 mL/min — ABNORMAL LOW (ref >60)
GFR, EST AFRICAN AMERICAN: 30 mL/min — AB (ref >60)
Glucose, Bld: 112 mg/dL — ABNORMAL HIGH (ref 65–99)
POTASSIUM: 4.1 mmol/L (ref 3.5–5.1)
SODIUM: 134 mmol/L — AB (ref 135–145)
Total Bilirubin: 0.4 mg/dL (ref 0.3–1.2)
Total Protein: 7.2 g/dL (ref 6.5–8.1)

## 2017-09-11 LAB — URINALYSIS, ROUTINE W REFLEX MICROSCOPIC
Bilirubin Urine: NEGATIVE
GLUCOSE, UA: NEGATIVE mg/dL
Ketones, ur: NEGATIVE mg/dL
NITRITE: NEGATIVE
Protein, ur: NEGATIVE mg/dL
SPECIFIC GRAVITY, URINE: 1.015 (ref 1.005–1.030)
pH: 5 (ref 5.0–8.0)

## 2017-09-11 LAB — I-STAT TROPONIN, ED: Troponin i, poc: 0.01 ng/mL (ref 0.00–0.08)

## 2017-09-11 LAB — LIPASE, BLOOD: Lipase: 20 U/L (ref 11–51)

## 2017-09-11 MED ORDER — LEVOFLOXACIN 750 MG PO TABS
750.0000 mg | ORAL_TABLET | Freq: Once | ORAL | Status: AC
  Administered 2017-09-11: 750 mg via ORAL
  Filled 2017-09-11: qty 1

## 2017-09-11 MED ORDER — IPRATROPIUM-ALBUTEROL 0.5-2.5 (3) MG/3ML IN SOLN
3.0000 mL | Freq: Once | RESPIRATORY_TRACT | Status: AC
  Administered 2017-09-11: 3 mL via RESPIRATORY_TRACT
  Filled 2017-09-11: qty 3

## 2017-09-11 MED ORDER — LEVOFLOXACIN 750 MG PO TABS: 750.0000 mg | ORAL_TABLET | ORAL | 0 refills | Status: DC

## 2017-09-11 MED ORDER — DOXYCYCLINE HYCLATE 100 MG PO TABS: 100.0000 mg | ORAL_TABLET | Freq: Once | ORAL | Status: DC

## 2017-09-11 MED ORDER — ALBUTEROL SULFATE HFA 108 (90 BASE) MCG/ACT IN AERS
2.0000 | INHALATION_SPRAY | Freq: Once | RESPIRATORY_TRACT | Status: AC
  Administered 2017-09-11: 2 via RESPIRATORY_TRACT
  Filled 2017-09-11: qty 6.7

## 2017-09-11 NOTE — ED Notes (Signed)
Made Respiratory aware of neb treatment ordered.

## 2017-09-11 NOTE — ED Notes (Signed)
Pt to xray

## 2017-09-11 NOTE — ED Notes (Signed)
Made Tatyana aware of patient legs being weak when getting up to ambulate with 2 person assist and having to sit back down.

## 2017-09-11 NOTE — ED Provider Notes (Signed)
  Face-to-face evaluation   History: She presents for evaluation of chest and abdominal pain.  She states that she is hungry.  She has a mild cough, but that is improving  Physical exam: Alert elderly female.  No respiratory distress.  Heart regular rate and rhythm without murmur lungs clear anteriorly.  Medical screening examination/treatment/procedure(s) were conducted as a shared visit with non-physician practitioner(s) and myself.  I personally evaluated the patient during the encounter   Daleen Bo, MD 09/12/17 2154
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
## 2017-09-11 NOTE — ED Triage Notes (Addendum)
Pt here from [facility] via EMS with c/o chest wall pain when coughing. Pt denies pain at present. Pt states she has feel weak and "pain with breathing" since she stopped smoking a month ago. Pt added she is also constipated. Pt is ambulatory. Pt denies changes in chest wall pain or breathing. Pt stated "I just need my cholesterol medicine, I am here to get a prescription for it".

## 2017-09-11 NOTE — Discharge Instructions (Addendum)
Use inhaler 2 puffs every 4 hrs for wheezing and shortness of breath. Take antibiotic as prescribed, one tablet every 48hrs, follow up with family doctor in 3 days for recheck.

## 2017-09-11 NOTE — ED Notes (Signed)
Assisted patient to standing position to walk to restroom.  Pt had to sit back down stating her legs feel weak.

## 2017-09-12 LAB — URINE CULTURE

## 2017-09-13 ENCOUNTER — Encounter (HOSPITAL_COMMUNITY): Payer: Self-pay

## 2017-09-13 ENCOUNTER — Emergency Department (HOSPITAL_COMMUNITY): Payer: Medicare Other

## 2017-09-13 ENCOUNTER — Other Ambulatory Visit: Payer: Self-pay

## 2017-09-13 ENCOUNTER — Inpatient Hospital Stay (HOSPITAL_COMMUNITY): Payer: Medicare Other

## 2017-09-13 ENCOUNTER — Inpatient Hospital Stay (HOSPITAL_COMMUNITY)
Admission: EM | Admit: 2017-09-13 | Discharge: 2017-09-15 | DRG: 682 | Disposition: A | Discharge status: Home Health | Payer: Medicare Other | Attending: Internal Medicine | Admitting: Internal Medicine

## 2017-09-13 DIAGNOSIS — E785 Hyperlipidemia, unspecified: Secondary | ICD-10-CM | POA: Diagnosis present

## 2017-09-13 DIAGNOSIS — L84 Corns and callosities: Secondary | ICD-10-CM | POA: Diagnosis present

## 2017-09-13 DIAGNOSIS — J181 Lobar pneumonia, unspecified organism: Secondary | ICD-10-CM | POA: Diagnosis present

## 2017-09-13 DIAGNOSIS — N179 Acute kidney failure, unspecified: Secondary | ICD-10-CM | POA: Diagnosis not present

## 2017-09-13 DIAGNOSIS — Z86718 Personal history of other venous thrombosis and embolism: Secondary | ICD-10-CM | POA: Diagnosis not present

## 2017-09-13 DIAGNOSIS — N3001 Acute cystitis with hematuria: Secondary | ICD-10-CM | POA: Diagnosis present

## 2017-09-13 DIAGNOSIS — R590 Localized enlarged lymph nodes: Secondary | ICD-10-CM | POA: Diagnosis present

## 2017-09-13 DIAGNOSIS — G8929 Other chronic pain: Secondary | ICD-10-CM | POA: Diagnosis not present

## 2017-09-13 DIAGNOSIS — R319 Hematuria, unspecified: Secondary | ICD-10-CM

## 2017-09-13 DIAGNOSIS — J8410 Pulmonary fibrosis, unspecified: Secondary | ICD-10-CM | POA: Diagnosis present

## 2017-09-13 DIAGNOSIS — R918 Other nonspecific abnormal finding of lung field: Secondary | ICD-10-CM | POA: Diagnosis present

## 2017-09-13 DIAGNOSIS — R404 Transient alteration of awareness: Secondary | ICD-10-CM | POA: Diagnosis not present

## 2017-09-13 DIAGNOSIS — Z7982 Long term (current) use of aspirin: Secondary | ICD-10-CM

## 2017-09-13 DIAGNOSIS — R42 Dizziness and giddiness: Secondary | ICD-10-CM | POA: Diagnosis not present

## 2017-09-13 DIAGNOSIS — I951 Orthostatic hypotension: Secondary | ICD-10-CM

## 2017-09-13 DIAGNOSIS — H548 Legal blindness, as defined in USA: Secondary | ICD-10-CM

## 2017-09-13 DIAGNOSIS — Z800 Family history of malignant neoplasm of digestive organs: Secondary | ICD-10-CM | POA: Diagnosis not present

## 2017-09-13 DIAGNOSIS — J449 Chronic obstructive pulmonary disease, unspecified: Secondary | ICD-10-CM | POA: Diagnosis not present

## 2017-09-13 DIAGNOSIS — Z7902 Long term (current) use of antithrombotics/antiplatelets: Secondary | ICD-10-CM

## 2017-09-13 DIAGNOSIS — Z947 Corneal transplant status: Secondary | ICD-10-CM

## 2017-09-13 DIAGNOSIS — R531 Weakness: Secondary | ICD-10-CM | POA: Diagnosis not present

## 2017-09-13 DIAGNOSIS — N189 Chronic kidney disease, unspecified: Secondary | ICD-10-CM

## 2017-09-13 DIAGNOSIS — M810 Age-related osteoporosis without current pathological fracture: Secondary | ICD-10-CM | POA: Diagnosis present

## 2017-09-13 DIAGNOSIS — H9193 Unspecified hearing loss, bilateral: Secondary | ICD-10-CM

## 2017-09-13 DIAGNOSIS — J45909 Unspecified asthma, uncomplicated: Secondary | ICD-10-CM | POA: Diagnosis present

## 2017-09-13 DIAGNOSIS — N3000 Acute cystitis without hematuria: Secondary | ICD-10-CM

## 2017-09-13 DIAGNOSIS — Z8672 Personal history of thrombophlebitis: Secondary | ICD-10-CM

## 2017-09-13 DIAGNOSIS — R8271 Bacteriuria: Secondary | ICD-10-CM | POA: Diagnosis not present

## 2017-09-13 DIAGNOSIS — D649 Anemia, unspecified: Secondary | ICD-10-CM | POA: Diagnosis not present

## 2017-09-13 DIAGNOSIS — I739 Peripheral vascular disease, unspecified: Secondary | ICD-10-CM | POA: Diagnosis not present

## 2017-09-13 DIAGNOSIS — H9190 Unspecified hearing loss, unspecified ear: Secondary | ICD-10-CM | POA: Diagnosis present

## 2017-09-13 DIAGNOSIS — J439 Emphysema, unspecified: Secondary | ICD-10-CM | POA: Diagnosis not present

## 2017-09-13 DIAGNOSIS — I129 Hypertensive chronic kidney disease with stage 1 through stage 4 chronic kidney disease, or unspecified chronic kidney disease: Secondary | ICD-10-CM | POA: Diagnosis not present

## 2017-09-13 DIAGNOSIS — N183 Chronic kidney disease, stage 3 (moderate): Secondary | ICD-10-CM | POA: Diagnosis present

## 2017-09-13 DIAGNOSIS — Z79899 Other long term (current) drug therapy: Secondary | ICD-10-CM

## 2017-09-13 DIAGNOSIS — R911 Solitary pulmonary nodule: Secondary | ICD-10-CM

## 2017-09-13 DIAGNOSIS — M79673 Pain in unspecified foot: Secondary | ICD-10-CM | POA: Diagnosis present

## 2017-09-13 DIAGNOSIS — J189 Pneumonia, unspecified organism: Secondary | ICD-10-CM | POA: Diagnosis not present

## 2017-09-13 DIAGNOSIS — Z87891 Personal history of nicotine dependence: Secondary | ICD-10-CM

## 2017-09-13 LAB — CBC WITH DIFFERENTIAL/PLATELET
BASOS PCT: 0 %
Basophils Absolute: 0 10*3/uL (ref 0.0–0.1)
EOS ABS: 0 10*3/uL (ref 0.0–0.7)
Eosinophils Relative: 0 %
HEMATOCRIT: 29.3 % — AB (ref 36.0–46.0)
Hemoglobin: 9.3 g/dL — ABNORMAL LOW (ref 12.0–15.0)
LYMPHS ABS: 1.4 10*3/uL (ref 0.7–4.0)
Lymphocytes Relative: 10 %
MCH: 29 pg (ref 26.0–34.0)
MCHC: 31.7 g/dL (ref 30.0–36.0)
MCV: 91.3 fL (ref 78.0–100.0)
Monocytes Absolute: 1.7 10*3/uL — ABNORMAL HIGH (ref 0.1–1.0)
Monocytes Relative: 12 %
NEUTROS ABS: 10.6 10*3/uL — AB (ref 1.7–7.7)
NEUTROS PCT: 78 %
PLATELETS: 335 10*3/uL (ref 150–400)
RBC: 3.21 MIL/uL — ABNORMAL LOW (ref 3.87–5.11)
RDW: 14.3 % (ref 11.5–15.5)
WBC: 13.6 10*3/uL — AB (ref 4.0–10.5)

## 2017-09-13 LAB — BASIC METABOLIC PANEL
ANION GAP: 12 (ref 5–15)
BUN: 35 mg/dL — ABNORMAL HIGH (ref 6–20)
CO2: 23 mmol/L (ref 22–32)
Calcium: 8.8 mg/dL — ABNORMAL LOW (ref 8.9–10.3)
Chloride: 100 mmol/L — ABNORMAL LOW (ref 101–111)
Creatinine, Ser: 2.37 mg/dL — ABNORMAL HIGH (ref 0.44–1.00)
GFR, EST AFRICAN AMERICAN: 22 mL/min — AB (ref >60)
GFR, EST NON AFRICAN AMERICAN: 19 mL/min — AB (ref >60)
GLUCOSE: 113 mg/dL — AB (ref 65–99)
POTASSIUM: 4.2 mmol/L (ref 3.5–5.1)
Sodium: 135 mmol/L (ref 135–145)

## 2017-09-13 LAB — URINALYSIS, ROUTINE W REFLEX MICROSCOPIC
Bilirubin Urine: NEGATIVE
GLUCOSE, UA: NEGATIVE mg/dL
Ketones, ur: NEGATIVE mg/dL
NITRITE: NEGATIVE
PH: 5 (ref 5.0–8.0)
Protein, ur: 30 mg/dL — AB
RBC / HPF: >50 RBC/hpf — ABNORMAL HIGH (ref 0–5)
SPECIFIC GRAVITY, URINE: 1.015 (ref 1.005–1.030)
WBC, UA: >50 WBC/hpf — ABNORMAL HIGH (ref 0–5)

## 2017-09-13 LAB — I-STAT TROPONIN, ED: Troponin i, poc: 0 ng/mL (ref 0.00–0.08)

## 2017-09-13 LAB — I-STAT CG4 LACTIC ACID, ED
Lactic Acid, Venous: 0.8 mmol/L (ref 0.5–1.9)
Lactic Acid, Venous: 0.96 mmol/L (ref 0.5–1.9)

## 2017-09-13 MED ORDER — CLOPIDOGREL BISULFATE 75 MG PO TABS
75.0000 mg | ORAL_TABLET | Freq: Every day | ORAL | Status: DC
  Administered 2017-09-14: 75 mg via ORAL
  Filled 2017-09-13: qty 1

## 2017-09-13 MED ORDER — ALBUTEROL SULFATE (2.5 MG/3ML) 0.083% IN NEBU: 3.0000 mL | INHALATION_SOLUTION | Freq: Four times a day (QID) | RESPIRATORY_TRACT | Status: DC | PRN

## 2017-09-13 MED ORDER — SODIUM CHLORIDE 0.9 % IV BOLUS
1000.0000 mL | Freq: Once | INTRAVENOUS | Status: AC
  Administered 2017-09-13: 1000 mL via INTRAVENOUS

## 2017-09-13 MED ORDER — CEFTRIAXONE SODIUM 1 G IJ SOLR
1.0000 g | Freq: Once | INTRAMUSCULAR | Status: AC
  Administered 2017-09-13: 1 g via INTRAVENOUS
  Filled 2017-09-13: qty 10

## 2017-09-13 MED ORDER — SODIUM CHLORIDE 0.9 % IV SOLN
INTRAVENOUS | Status: AC
  Administered 2017-09-13: 100 mL/h via INTRAVENOUS

## 2017-09-13 MED ORDER — HYDROCODONE-ACETAMINOPHEN 5-325 MG PO TABS
1.0000 | ORAL_TABLET | Freq: Two times a day (BID) | ORAL | Status: DC | PRN
  Administered 2017-09-14 – 2017-09-15 (×3): 1 via ORAL
  Filled 2017-09-13 (×3): qty 1

## 2017-09-13 MED ORDER — ATORVASTATIN CALCIUM 40 MG PO TABS
40.0000 mg | ORAL_TABLET | Freq: Every day | ORAL | Status: DC
  Administered 2017-09-13 – 2017-09-15 (×3): 40 mg via ORAL
  Filled 2017-09-13 (×3): qty 1

## 2017-09-13 MED ORDER — ENOXAPARIN SODIUM 30 MG/0.3ML ~~LOC~~ SOLN
30.0000 mg | SUBCUTANEOUS | Status: DC
  Administered 2017-09-13 – 2017-09-14 (×2): 30 mg via SUBCUTANEOUS
  Filled 2017-09-13 (×2): qty 0.3

## 2017-09-13 MED ORDER — SODIUM CHLORIDE 0.9 % IV SOLN
500.0000 mg | Freq: Once | INTRAVENOUS | Status: AC
  Administered 2017-09-13: 500 mg via INTRAVENOUS
  Filled 2017-09-13: qty 500

## 2017-09-13 MED ORDER — ACETAMINOPHEN 650 MG RE SUPP: 650.0000 mg | Freq: Four times a day (QID) | RECTAL | Status: DC | PRN

## 2017-09-13 MED ORDER — ACETAMINOPHEN 325 MG PO TABS: 650.0000 mg | ORAL_TABLET | Freq: Four times a day (QID) | ORAL | Status: DC | PRN

## 2017-09-13 MED ORDER — SODIUM CHLORIDE 0.9 % IV SOLN
Freq: Once | INTRAVENOUS | Status: AC
  Administered 2017-09-13: 16:00:00 via INTRAVENOUS

## 2017-09-13 NOTE — ED Notes (Signed)
Per PA: Pt may eat and drink. Pt provided with sandwich, ice cream  and coke

## 2017-09-13 NOTE — ED Notes (Signed)
Patient transported to X-ray 

## 2017-09-13 NOTE — ED Notes (Signed)
[facility] called for transport. 

## 2017-09-13 NOTE — ED Notes (Signed)
Patient asked me to unplug her phone and place the charger in the blue bag and the phone in her black pocket book.

## 2017-09-13 NOTE — ED Notes (Signed)
Pt at CT

## 2017-09-13 NOTE — H&P (Signed)
Date: 09/13/2017               Patient Name:  [name] MRN: [id number]  DOB: March 18, 1941 Age / Sex: 77 y.o., female   PCP: Sid Falcon, MD         Medical Service: Internal Medicine Teaching Service         Attending Physician: Dr. Oval Linsey, MD    First Contact: Dr. Tarri Abernethy Pager: [phone]  Second Contact: Dr. Philipp Ovens Pager: [phone]       After Hours (After 5p/  First Contact Pager: [phone]  weekends / holidays): Second Contact Pager: [phone]   Chief Complaint: Dizziness  History of Present Illness: [name] is a 77 yo F with Hx of CKD III, COPD, PAD, HLD, HTN, distant DVT, and legal blindness who presented with dizziness and lightheadedness. Patient initially presented to the ED on 5/3 with complaints of chest pain and shortness of breath with coughing that had been ongoing for a couple of months, which had caused her to quit smoking about 2 months ago as well. Consolidation in RUL was noted on her CXR and UA showed WBCs and Bacteria (Ur culture from this visit grew multiple species). She was treated for possible pneumonia and UTI with Levaquin in the ED with prescription for it to be taken every other day given her CKD III. Patient states that after taking the medication that day she felt dizzy when she was out at the bank. On 5/4 she said she felt like her usual self, but on the day of admission, after taking her medication, she states that she again felt dizzy and light headed. She had to crawl back to her bed this morning for fear of passing out and then called 911 to bring her to the hospital. En route she was noted to have positive orthostatics and was given 59mL bolus. Patient states that she has been feeling like her normal self other that the dizziness. She endorses subjective fevers and night sweats for about 1 week, Cough productive of green sputum for just over 2 months, chest pain with coughing, intermittent constipation (last BM today with normal size,  shape, and color) and weight loss (though chart review shows minimal weight loss in the past year). She denies dyspnea, diarrhea, urinary frequency, urine odor, dysuria, noticeable nodules, hemoptysis, dark/bloody stools, or recent travel.  In the ED she was noted to be hypotensive, but with otherwise stable vitals.  Repeat CBC showed WBC 13.6 (from 10.5), Hgb 9.3 (from 10.5); BMP showed Cr 2.37 (baseline ~1.5); Lactate and troponin WNL, Repeat UA showed hematuria, protein, leukocytes, and bacteria. Repeat CXR noted progressive right airspace disease with concern for pneumonia vs reactivation TB vs Neoplasm. Patient received 1L IVF and dose of IV azithromycin and ceftriaxone. IMTS was consulted for transfer from [facility] and admission. Upon day team review of initial results a Chest CT was ordered to characterized potential RUL finding with concern for neoplasm. This showed mass-like consolidation in apical and posterior subsegment of RUL worrisome for malignancy (pnuemonia not excluded), mediastinal and hilar adenopathy, subpleural pulmonary fibrosis, and emphysema. Patient admitted for further  Workup and care.  Meds:  Current Meds  Medication Sig  . albuterol (PROVENTIL HFA;VENTOLIN HFA) 108 (90 Base) MCG/ACT inhaler Inhale 2 puffs into the lungs every 6 (six) hours as needed for wheezing or shortness of breath.  Marland Kitchen atorvastatin (LIPITOR) 40 MG tablet Take 1 tablet (40 mg total) by mouth daily.  Marland Kitchen  clopidogrel (PLAVIX) 75 MG tablet Take 1 tablet (75 mg total) daily with breakfast by mouth.  . hydrochlorothiazide (MICROZIDE) 12.5 MG capsule Take 1 capsule (12.5 mg total) daily by mouth.  . [DISCONTINUED] levofloxacin (LEVAQUIN) 750 MG tablet Take 1 tablet (750 mg total) by mouth every other day.    Allergies: Allergies as of 09/13/2017 - Review Complete 09/13/2017  Allergen Reaction Noted  . Codeine Other (See Comments) 11/28/2009  . Morphine Other (See Comments) 11/28/2009   Past Medical  History:  Diagnosis Date  . Arthritis    "left shoulder" (04/07/2016)  . Blind    s/p bilateral corneal transplant; "legally blind in both eyes" (04/07/2016)  . CKD (chronic kidney disease), stage III ([place])   . COPD (chronic obstructive pulmonary disease) ([place])   . DVT (deep venous thrombosis) ([place]) "years ago"   "right thigh"    . HEARING LOSS   . Hyperlipidemia   . Hypertension   . PAD (peripheral artery disease) ([place])    a. 03/2016 Periph Angio: L SFA 99p, 75m w/ one vessel runoff. R SFA 146m w/ one vessel runofff;  b. 04/07/2016 PTA L SFA ([place] 1 LS atherecromty w/ DEBA). c. 04/24/16 s/p PTA to R SFA, right tibioperoneal trunk and posterior tibial artery.  . Phlebitis   . Tobacco abuse     Family History: Family History  Problem Relation Age of Onset  . Cancer Maternal Grandmother        liver  . Cancer Maternal Uncle        colon  . Cancer Mother        liver  . Cancer Brother        Unknown  . Cancer Sister        Unknown  - Reviewed on admission, extensive family history of cancer  Social History: Social History   Tobacco Use  . Smoking status: Former Smoker    Packs/day: 0.30    Years: 35.00    Pack years: 10.50    Types: Cigarettes    Last attempt to quit: 08/12/2017    Years since quitting: 0.0  . Smokeless tobacco: Never Used  . Tobacco comment: Sometimes less-6 7 CIGARETTES A DAY; uses patches sometimes.  Substance Use Topics  . Alcohol use: No    Alcohol/week: 0.0 oz  . Drug use: No  - Review on admission, Patient quit smoking 2 months ago - Patient worked at an [employer], [employer] and m[employer], denies asbestos exposure  Review of Systems: A complete ROS was negative except as per HPI.  Physical Exam: Blood pressure 110/76, pulse 90, temperature 98.1 F (36.7 C), temperature source Oral, resp. rate 18, height 5\' 4"  (1.626 m), weight 143 lb 6.6 oz (65.1 kg), SpO2 93 %. Physical Exam  Constitutional: She appears well-developed and  well-nourished. No distress.  HENT:  Head: Normocephalic and atraumatic.  Cardiovascular: Normal rate and regular rhythm.  Systolic murmur  Pulmonary/Chest: Effort normal. No respiratory distress.  Fine bibasilar crackles   Abdominal: Soft. Bowel sounds are normal. She exhibits no distension.  Mildly tender to palpation of abdomen diffusely  Musculoskeletal: She exhibits no edema.  No cervical of clavicular andenopathy No right axillary adenopathy Small nodule palpated at left axilla at the site of previous I&D  Lymphadenopathy:    She has no cervical adenopathy.  Neurological: She is alert.  Skin: Skin is warm and dry.   EKG: personally reviewed my interpretation is normal sinus rhythm at 80 BPM.  CXR:  personally reviewed my interpretation is good inspiration, rotation, and penetration. Progression or right apical opacity compared to previous on 09/11/2017.   CT Chest: IMPRESSION: 1. Masslike consolidation in the apical and posterior segments of the right upper lobe, worrisome for malignancy when compared with chest radiographs dating back to 01/11/2017. Pneumonia is not excluded. 2. Mediastinal and suspected right hilar adenopathy. 3. Subpleural pulmonary fibrosis, poorly evaluated due to respiratory motion and lack of high-resolution imaging. 4. Aortic atherosclerosis (ICD10-170.0). Extensive 3 vessel coronary artery calcification. 5.  Emphysema   Assessment & Plan by Problem: Principal Problem:   Right upper lobe consolidation (HCC) Active Problems:   Hyperlipidemia   PVD (peripheral vascular disease) s/p LLE PTA 03/2016, RLE PTA 04/2016   Acute-on-chronic kidney injury ([place])  Right Upper Lobe Consolidation: Consolidation noted in RUL with concern for Malignancy vs Pneumonia. CT (non-contrast due to AKI/CKD) showed RUL consolidation, mediastinal and suspect hilar adenopathy, as well as chronic fibrosis and emphysema. Patient has a strong family history of cancer of various  types, though specific cancer unknown for some relatives. Lower suspicion for pneumonia at this time as patient denies any symptoms beyond subjective fever and cough productive of green sputum (including during her previous evaluations). She does have leukocytosis to 13, but this may be reactive and has increased while on antibiotic therapy. Other possible etiologies include sarcoidosis (with hilar adenopathy) and TB (unlikely as patient denies recent travel, exposure, and is not immunocompromised)  On chart review, possible mass overlying first rib noted on 01/11/2017 ED visit, this was explained to patient with concern for cancer given her smoking history and that she needed to follow up with her PCP. At PCP visit about 1 month later patient endorsed being told she had masses in her chest years prior (though when asked today, she states the 2018 ED visit was the first time she had been told masses). She was noted to have no CT in records, and did not meet criteria for low dose CT screening. In Feb 2019 patient was seen and offered cancer screening but refused mammogram, conlonscopy, and lung cancer screening. - Consider Pulmonary consult in AM for potential biopsy for further work up of nodule - Low threshold to reinitiate antibiotic if patient experiences respiratory symptoms or spikes a fever - Monitor fever curve - AM CBC  Pre-Synocope AKI on CKD III: Patient reports episodes of light headedness and dizziness for the pat 2 days and was noted to have positive orthostatics in route to ED. She has received 1.5L IVF with additional piggyback fluids. Cr elevated to 2.37 in ED (Baseline of 1.5 and also elevated at ED visit on 5/3 to 1.29). Suspect prerenal etiology given hypotension will reassess following admnistration of fluids. Will obtain Renal US to rule out obstructive etiology give large Left renal cyst. > CKD origin unclear (Renal US, SPEP, ANA, C3/C4, ANCA had been ordered at office visit in 2015, but  patient left without doing lab work and did not have US performed) > CT Abdomen 9/218: Multiple renal cysts (large left), stable in size. Punctate hilar calcifications, likely vascular. No hydronephrosis. Normal bladder. - NS @ 13mL/hr - Renal Ultrasound - BMP in AM  Normocytic Anemia: Recent Hgb's of 10.5 and 9.3. Previous baseline 12-14. Patient denies hemoptysis, blood in urine, or bloody/dark bowl movements lowering suspicion for blood loss. Anemia may be secondary to CKD vs Chronic disease (possibly malignancy). Hemolysis less likely as CMP on 5/3 showed low-normal bilirubin. - Reticulocyte count - CBC in AM  Asymptomatic Bacteruria: - Repeat U/A as previous samples contaminated with squamous cells. - Discontinued antibiotics as patient denies any urinary symptoms   COPD: No PFTs in EMR. Previously on [place], but has not taken these in years. Currently only on Albuterol PRN at home. - Continue home Albuterol  HLD: - Continue home atorvastatin  PAD: S/P staged right and left SFA interventions in Nov and Dec 2017. Doppler 06/04/16 showed normal ABIs. - Continue home Plavix 75mg  Daily  Chronic Foot pain: 2/2 to calluses. On chronic Norco 5-325 BID PRN. Verbal pain contract documented as patient is legally blind and unable to read.  - Continue home Norco 5-325 BID PRN  Osteoporosis: DEXA 11/28/2014 showed L Femur Neck t-score -1.8 corresponding to osteopenia. However, with patient's history of vertebral fracture in 2015, patient was to start bisphosphonate therapy per result note; but this result appears to have been lost to follow up. - Consider starting bisphosphonate therapy  FEN: 100cc/hr, Heart diet VTE ppx: Lovenox 30mg  Code Status: Full   Dispo: Admit patient to Inpatient with expected length of stay greater than 2 midnights.  Signed: Neva Seat, MD 09/13/2017, 11:34 PM  Pager: [phone]

## 2017-09-13 NOTE — ED Triage Notes (Signed)
Pt arrives via [facility] from [facility]. Pt Dx with UTI and pna on 09/11/17. Pt prescribed abx. Pt took first dose of abx this morning. Pt reports after taking abx she began having dizziness. EMS reports she was positive for orthostatic hypotension. EMS reports BP dropped 15-20 mmHg and HR had significant increase upon standing with positive dizziness.
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
## 2017-09-13 NOTE — ED Notes (Signed)
ED TO INPATIENT HANDOFF REPORT  Name/Age/Gender [name] 77 y.o. female  Code Status Code Status History    Date Active Date Inactive Code Status Order ID Comments User Context   01/14/2017 1537 01/17/2017 1813 Full Code [id number]  Rosita Fire, MD ED   04/24/2016 1654 04/25/2016 1323 Full Code [id number]  Lorretta Harp, MD Inpatient   04/06/2016 1407 04/08/2016 1259 Full Code [id number]  Cheryln Manly, NP Inpatient   03/16/2016 1254 03/17/2016 1537 Full Code [id number]  Leanor Kail, [facility] Inpatient   02/21/2014 0035 02/21/2014 1933 Full Code [id number]  Jones Bales, MD Inpatient      Home/SNF/Other Home  Chief Complaint Dizziness  Level of Care/Admitting Diagnosis ED Disposition    ED Disposition Condition [place]: [place] [100100]  Level of Care: Med-Surg [16]  Diagnosis: Pneumonia [419622]  Admitting Physician: Oval Linsey [phone]  Attending Physician: Oval Linsey [phone]  Estimated length of stay: past midnight tomorrow  Certification:: I certify this patient will need inpatient services for at least 2 midnights  PT Class (Do Not Modify): Inpatient [101]  PT Acc Code (Do Not Modify): Private [1]       Medical History Past Medical History:  Diagnosis Date  . Arthritis    "left shoulder" (04/07/2016)  . Blind    s/p bilateral corneal transplant; "legally blind in both eyes" (04/07/2016)  . CKD (chronic kidney disease), stage III ([place])   . COPD (chronic obstructive pulmonary disease) ([place])   . DVT (deep venous thrombosis) ([place]) "years ago"   "right thigh"    . HEARING LOSS   . Hyperlipidemia   . Hypertension   . PAD (peripheral artery disease) ([place])    a. 03/2016 Periph Angio: L SFA 99p, 35mw/ one vessel runoff. R SFA 109m/ one vessel runofff;  b. 04/07/2016 PTA L SFA (Hawk 1 LS atherecromty w/ DEBA). c. 04/24/16 s/p PTA to R SFA, right tibioperoneal trunk and posterior tibial  artery.  . Phlebitis   . Tobacco abuse     Allergies Allergies  Allergen Reactions  . Codeine Other (See Comments)    REACTION: Pt c/o "feeling high"  . Morphine Other (See Comments)    REACTION: feels funny and "high"    IV Location/Drains/Wounds Patient Lines/Drains/Airways Status   Active Line/Drains/Airways    Name:   Placement date:   Placement time:   Site:   Days:   Peripheral IV 09/13/17 Left Hand   09/13/17    -    Hand   less than 1   Peripheral IV 09/13/17 Left Antecubital   09/13/17    1309    Antecubital   less than 1   Incision (Closed) 01/16/17 Abdomen   01/16/17    1027     240   Incision - 4 Ports Abdomen Right;Lateral Right;Medial Umbilicus Mid;Upper   0919/41/74  0942     240          Labs/Imaging Results for orders placed or performed during the hospital encounter of 09/13/17 (from the past 48 hour(s))  CBC with Differential     Status: Abnormal   Collection Time: 09/13/17  1:08 PM  Result Value Ref Range   WBC 13.6 (H) 4.0 - 10.5 K/uL   RBC 3.21 (L) 3.87 - 5.11 MIL/uL   Hemoglobin 9.3 (L) 12.0 - 15.0 g/dL   HCT 29.3 (L) 36.0 - 46.0 %   MCV 91.3 78.0 -  100.0 fL   MCH 29.0 26.0 - 34.0 pg   MCHC 31.7 30.0 - 36.0 g/dL   RDW 14.3 11.5 - 15.5 %   Platelets 335 150 - 400 K/uL   Neutrophils Relative % 78 %   Neutro Abs 10.6 (H) 1.7 - 7.7 K/uL   Lymphocytes Relative 10 %   Lymphs Abs 1.4 0.7 - 4.0 K/uL   Monocytes Relative 12 %   Monocytes Absolute 1.7 (H) 0.1 - 1.0 K/uL   Eosinophils Relative 0 %   Eosinophils Absolute 0.0 0.0 - 0.7 K/uL   Basophils Relative 0 %   Basophils Absolute 0.0 0.0 - 0.1 K/uL    Comment: Performed at [facility], [place] [address], [place], [place] [address]  Basic metabolic panel     Status: Abnormal   Collection Time: 09/13/17  1:08 PM  Result Value Ref Range   Sodium 135 135 - 145 mmol/L   Potassium 4.2 3.5 - 5.1 mmol/L   Chloride 100 (L) 101 - 111 mmol/L   CO2 23 22 - 32 mmol/L   Glucose, Bld 113  (H) 65 - 99 mg/dL   BUN 35 (H) 6 - 20 mg/dL   Creatinine, Ser 2.37 (H) 0.44 - 1.00 mg/dL   Calcium 8.8 (L) 8.9 - 10.3 mg/dL   GFR calc non Af Amer 19 (L) >60 mL/min   GFR calc Af Amer 22 (L) >60 mL/min    Comment: (NOTE) The eGFR has been calculated using the CKD EPI equation. This calculation has not been validated in all clinical situations. eGFR's persistently <60 mL/min signify possible Chronic Kidney Disease.    Anion gap 12 5 - 15    Comment: Performed at [facility], [place] [address] [place], [place] [address]  Urinalysis, Routine w reflex microscopic     Status: Abnormal   Collection Time: 09/13/17  1:08 PM  Result Value Ref Range   Color, Urine AMBER (A) YELLOW    Comment: BIOCHEMICALS MAY BE AFFECTED BY COLOR   APPearance TURBID (A) CLEAR   Specific Gravity, Urine 1.015 1.005 - 1.030   pH 5.0 5.0 - 8.0   Glucose, UA NEGATIVE NEGATIVE mg/dL   Hgb urine dipstick MODERATE (A) NEGATIVE   Bilirubin Urine NEGATIVE NEGATIVE   Ketones, ur NEGATIVE NEGATIVE mg/dL   Protein, ur 30 (A) NEGATIVE mg/dL   Nitrite NEGATIVE NEGATIVE   Leukocytes, UA LARGE (A) NEGATIVE   RBC / HPF >50 (H) 0 - 5 RBC/hpf   WBC, UA >50 (H) 0 - 5 WBC/hpf   Bacteria, UA MANY (A) NONE SEEN   Squamous Epithelial / LPF 11-20 0 - 5    Comment: Please note change in reference range.   WBC Clumps PRESENT    Budding Yeast PRESENT    Non Squamous Epithelial 0-5 (A) NONE SEEN    Comment: Performed at [facility], [place] [address] [place], [place] [address]  I-Stat CG4 Lactic Acid, ED     Status: None   Collection Time: 09/13/17  1:15 PM  Result Value Ref Range   Lactic Acid, Venous 0.80 0.5 - 1.9 mmol/L  I-Stat CG4 Lactic Acid, ED     Status: None   Collection Time: 09/13/17  2:32 PM  Result Value Ref Range   Lactic Acid, Venous 0.96 0.5 - 1.9 mmol/L  I-Stat Troponin, ED (not at [facility])     Status: None   Collection Time: 09/13/17  2:32 PM  Result Value Ref Range  Troponin i, poc 0.00 0.00 - 0.08 ng/mL   Comment 3            Comment: Due to the release kinetics of cTnI, a negative result within the first hours of the onset of symptoms does not rule out myocardial infarction with certainty. If myocardial infarction is still suspected, repeat the test at appropriate intervals.    Dg Chest 2 View  Result Date: 09/13/2017 CLINICAL DATA:  Diagnosed with urinary tract infection and pneumonia 2 days ago. Dizziness after starting antibiotics. EXAM: CHEST - 2 VIEW COMPARISON:  Radiographs 09/11/2017 and 01/11/2017. Older studies are also correlated. FINDINGS: The heart size and mediastinal contours are stable. There is aortic atherosclerosis. There is volume loss in the right upper lobe with increasing right apical density. This is new from remote prior studies and likely inflammatory. Patchy basilar opacities bilaterally appears stable. There is no pleural effusion or pneumothorax. No acute osseous findings are seen. IMPRESSION: Progressive right apical airspace disease, most likely reflecting pneumonia. Reactivation tuberculosis should be excluded. Radiographic follow-up after appropriate therapy recommended to exclude neoplasm. Electronically Signed   By: Richardean Sale M.D.   On: 09/13/2017 14:45   Ct Chest Wo Contrast  Result Date: 09/13/2017 CLINICAL DATA:  Abnormal chest radiograph, persistent cough. EXAM: CT CHEST WITHOUT CONTRAST TECHNIQUE: Multidetector CT imaging of the chest was performed following the standard protocol without IV contrast. COMPARISON:  Chest radiograph 09/13/2017 and 01/11/2017. FINDINGS: Cardiovascular: Atherosclerotic calcification of the arterial vasculature, including extensive three-vessel involvement of the coronary arteries. Heart size normal. No pericardial effusion. Mediastinum/Nodes: Mediastinal adenopathy measures up to 1.6 cm in the low right paratracheal station. Right hilar adenopathy is suspected but evaluation is limited  without IV contrast. No axillary adenopathy. Esophagus is grossly unremarkable. Lungs/Pleura: Image quality is degraded by respiratory motion. There is masslike consolidation in the apical and posterior segments of the right upper lobe. Probable mild paraseptal emphysema. Subpleural reticulation and ground-glass are seen with a probable mild basilar predominance. Evaluation is limited due to respiratory motion. No pleural fluid. Airway is unremarkable. Upper Abdomen: Visualized portions of the liver, adrenal glands and right kidney are grossly unremarkable. Low-attenuation lesion in the left kidney measures 5.7 cm and is incompletely imaged. Visualized portions of the spleen, pancreas, stomach and bowel are grossly unremarkable. Cholecystectomy. Musculoskeletal: Degenerative changes in the spine. No worrisome lytic or sclerotic lesions. There may be mild scattered endplate compression deformities, age indeterminate. IMPRESSION: 1. Masslike consolidation in the apical and posterior segments of the right upper lobe, worrisome for malignancy when compared with chest radiographs dating back to 01/11/2017. Pneumonia is not excluded. 2. Mediastinal and suspected right hilar adenopathy. 3. Subpleural pulmonary fibrosis, poorly evaluated due to respiratory motion and lack of high-resolution imaging. 4. Aortic atherosclerosis (ICD10-170.0). Extensive 3 vessel coronary artery calcification. 5.  Emphysema (ICD10-J43.9). Electronically Signed   By: Lorin Picket M.D.   On: 09/13/2017 17:13    Pending Labs Unresulted Labs (From admission, onward)   Start     Ordered   09/13/17 1536  Urine culture  STAT,   STAT     09/13/17 1535      Vitals/Pain Today's Vitals   09/13/17 1216 09/13/17 1217 09/13/17 1400 09/13/17 1600  BP:   (!) 110/56 99/62  Pulse:   77 87  Resp:   18 16  Temp:      TempSrc:      SpO2:   100% 98%  Weight:  140 lb (63.5 kg)  PainSc: 8        Isolation Precautions No active  isolations  Medications Medications  azithromycin (ZITHROMAX) 500 mg in sodium chloride 0.9 % 250 mL IVPB (500 mg Intravenous New Bag/Given 09/13/17 1743)  sodium chloride 0.9 % bolus 1,000 mL (0 mLs Intravenous Stopped 09/13/17 1531)  0.9 %  sodium chloride infusion ( Intravenous New Bag/Given 09/13/17 1618)  cefTRIAXone (ROCEPHIN) 1 g in sodium chloride 0.9 % 100 mL IVPB (0 g Intravenous Stopped 09/13/17 1658)    Mobility walks with device

## 2017-09-14 DIAGNOSIS — H548 Legal blindness, as defined in USA: Secondary | ICD-10-CM

## 2017-09-14 DIAGNOSIS — Z79899 Other long term (current) drug therapy: Secondary | ICD-10-CM

## 2017-09-14 DIAGNOSIS — J8410 Pulmonary fibrosis, unspecified: Secondary | ICD-10-CM

## 2017-09-14 DIAGNOSIS — J439 Emphysema, unspecified: Secondary | ICD-10-CM

## 2017-09-14 DIAGNOSIS — E785 Hyperlipidemia, unspecified: Secondary | ICD-10-CM

## 2017-09-14 DIAGNOSIS — L84 Corns and callosities: Secondary | ICD-10-CM

## 2017-09-14 DIAGNOSIS — Z8701 Personal history of pneumonia (recurrent): Secondary | ICD-10-CM

## 2017-09-14 DIAGNOSIS — G8929 Other chronic pain: Secondary | ICD-10-CM

## 2017-09-14 DIAGNOSIS — N3001 Acute cystitis with hematuria: Secondary | ICD-10-CM | POA: Insufficient documentation

## 2017-09-14 DIAGNOSIS — Z885 Allergy status to narcotic agent status: Secondary | ICD-10-CM

## 2017-09-14 DIAGNOSIS — I739 Peripheral vascular disease, unspecified: Secondary | ICD-10-CM

## 2017-09-14 DIAGNOSIS — R918 Other nonspecific abnormal finding of lung field: Secondary | ICD-10-CM

## 2017-09-14 DIAGNOSIS — R8271 Bacteriuria: Secondary | ICD-10-CM

## 2017-09-14 DIAGNOSIS — Z87310 Personal history of (healed) osteoporosis fracture: Secondary | ICD-10-CM

## 2017-09-14 DIAGNOSIS — N183 Chronic kidney disease, stage 3 (moderate): Secondary | ICD-10-CM

## 2017-09-14 DIAGNOSIS — N179 Acute kidney failure, unspecified: Principal | ICD-10-CM

## 2017-09-14 DIAGNOSIS — R42 Dizziness and giddiness: Secondary | ICD-10-CM

## 2017-09-14 DIAGNOSIS — I129 Hypertensive chronic kidney disease with stage 1 through stage 4 chronic kidney disease, or unspecified chronic kidney disease: Secondary | ICD-10-CM

## 2017-09-14 DIAGNOSIS — Z7902 Long term (current) use of antithrombotics/antiplatelets: Secondary | ICD-10-CM

## 2017-09-14 DIAGNOSIS — M810 Age-related osteoporosis without current pathological fracture: Secondary | ICD-10-CM

## 2017-09-14 DIAGNOSIS — F17211 Nicotine dependence, cigarettes, in remission: Secondary | ICD-10-CM

## 2017-09-14 DIAGNOSIS — Z880 Allergy status to penicillin: Secondary | ICD-10-CM

## 2017-09-14 DIAGNOSIS — Z86718 Personal history of other venous thrombosis and embolism: Secondary | ICD-10-CM

## 2017-09-14 DIAGNOSIS — R011 Cardiac murmur, unspecified: Secondary | ICD-10-CM

## 2017-09-14 DIAGNOSIS — J181 Lobar pneumonia, unspecified organism: Secondary | ICD-10-CM

## 2017-09-14 DIAGNOSIS — M79673 Pain in unspecified foot: Secondary | ICD-10-CM

## 2017-09-14 DIAGNOSIS — D649 Anemia, unspecified: Secondary | ICD-10-CM

## 2017-09-14 LAB — CBC
HCT: 26.9 % — ABNORMAL LOW (ref 36.0–46.0)
HEMOGLOBIN: 8.5 g/dL — AB (ref 12.0–15.0)
MCH: 29 pg (ref 26.0–34.0)
MCHC: 31.6 g/dL (ref 30.0–36.0)
MCV: 91.8 fL (ref 78.0–100.0)
PLATELETS: 290 10*3/uL (ref 150–400)
RBC: 2.93 MIL/uL — AB (ref 3.87–5.11)
RDW: 14.2 % (ref 11.5–15.5)
WBC: 14.2 10*3/uL — ABNORMAL HIGH (ref 4.0–10.5)

## 2017-09-14 LAB — COMPREHENSIVE METABOLIC PANEL
ALBUMIN: 2.3 g/dL — AB (ref 3.5–5.0)
ALK PHOS: 47 U/L (ref 38–126)
ALT: 6 U/L — AB (ref 14–54)
AST: 12 U/L — ABNORMAL LOW (ref 15–41)
Anion gap: 9 (ref 5–15)
BUN: 27 mg/dL — ABNORMAL HIGH (ref 6–20)
CALCIUM: 8.5 mg/dL — AB (ref 8.9–10.3)
CO2: 25 mmol/L (ref 22–32)
CREATININE: 1.99 mg/dL — AB (ref 0.44–1.00)
Chloride: 103 mmol/L (ref 101–111)
GFR calc Af Amer: 27 mL/min — ABNORMAL LOW (ref >60)
GFR calc non Af Amer: 23 mL/min — ABNORMAL LOW (ref >60)
Glucose, Bld: 147 mg/dL — ABNORMAL HIGH (ref 65–99)
Potassium: 3.7 mmol/L (ref 3.5–5.1)
SODIUM: 137 mmol/L (ref 135–145)
Total Bilirubin: 0.6 mg/dL (ref 0.3–1.2)
Total Protein: 5.6 g/dL — ABNORMAL LOW (ref 6.5–8.1)

## 2017-09-14 LAB — URINALYSIS, ROUTINE W REFLEX MICROSCOPIC
Bilirubin Urine: NEGATIVE
GLUCOSE, UA: NEGATIVE mg/dL
KETONES UR: NEGATIVE mg/dL
NITRITE: NEGATIVE
PH: 5 (ref 5.0–8.0)
PROTEIN: NEGATIVE mg/dL
SPECIFIC GRAVITY, URINE: 1.013 (ref 1.005–1.030)

## 2017-09-14 LAB — RETICULOCYTES
RBC.: 2.95 MIL/uL — ABNORMAL LOW (ref 3.87–5.11)
RETIC COUNT ABSOLUTE: 23.6 10*3/uL (ref 19.0–186.0)
Retic Ct Pct: 0.8 % (ref 0.4–3.1)

## 2017-09-14 LAB — PROCALCITONIN: PROCALCITONIN: 0.4 ng/mL

## 2017-09-14 LAB — HIV ANTIBODY (ROUTINE TESTING W REFLEX): HIV SCREEN 4TH GENERATION: NONREACTIVE

## 2017-09-14 MED ORDER — CLOPIDOGREL BISULFATE 75 MG PO TABS
75.0000 mg | ORAL_TABLET | Freq: Every day | ORAL | Status: DC
  Administered 2017-09-15: 75 mg via ORAL
  Filled 2017-09-14: qty 1

## 2017-09-14 NOTE — Consult Note (Signed)
PULMONARY / CRITICAL CARE MEDICINE   Name: [name] MRN: [id number] DOB: 06-May-1941    ADMISSION DATE:  09/13/2017 CONSULTATION DATE:  5/6  REFERRING MD:  Eppie Gibson   CHIEF COMPLAINT:  Lung mass  HISTORY OF PRESENT ILLNESS:   77 year old female with multiple medical problems including chronic kidney disease stage III, COPD, full vascular disease and hypertension also prior DVT.  She is legally blind.  Presented to the emergency room on 5/3 with chief complaint of generalized weakness, abd discomfort, and cough w/ associated chest pain and shortness of breath. These symptoms she recalls started about 2 months ago & were the reason she stopped smoking. She also felt that the shortness of breath and cough was a little better with this. She was diagnosed w/ a UTI (multiple orgs) and PNA and sent home w/ Levaquin. Presented again to the ER on 5/5 w/ cc: dizziness, orthostatic and what she described as feeling "loopy" after taking her levaquin. In the ED she was found to be hypotensive. CXR showed again dense RUL consolidation and CT chest was advised. She was admitted w/ working dx of pneumonia and acute on chronic renal failure. The CT chest was obtained on 5/3 and showed: large apical RUL lung consolidation (actually present to lesser degree back in sept 2018), probable right hilar adenopathy and bibasilar interstitial changes. PCCM was asked to see re: her abnormal CT scan to assist w/ diagnosis of her lung consolidation.    PAST MEDICAL HISTORY :  She  has a past medical history of Arthritis, Blind, CKD (chronic kidney disease), stage III ([place]), COPD (chronic obstructive pulmonary disease) ([place]), DVT (deep venous thrombosis) ([facility]) ("years ago"), HEARING LOSS, Hyperlipidemia, Hypertension, PAD (peripheral artery disease) ([place]), Phlebitis, and Tobacco abuse.  PAST SURGICAL HISTORY: She  has a past surgical history that includes Shoulder open rotator cuff repair (Right, 1973); Cardiac  catheterization (N/A, 03/17/2016); Cardiac catheterization (Bilateral, 04/07/2016); Cardiac catheterization (Left, 04/07/2016); Eye surgery (Bilateral); Wrist fracture surgery (Right); Fracture surgery; Patella fracture surgery (Right, 1983); Shoulder open rotator cuff repair (Left, 06/2003); Carpal tunnel release (Right, 04/2003); Shoulder arthroscopy w/ rotator cuff repair (Right, 09/2002); Hammer toe surgery (Left, 01/2010); Cardiac catheterization (04/24/2016); Cardiac catheterization (Right, 04/24/2016); Cardiac catheterization (Right, 04/24/2016); and Cholecystectomy (N/A, 01/16/2017).  Allergies  Allergen Reactions  . Codeine Other (See Comments)    REACTION: Pt c/o "feeling high"  . Morphine Other (See Comments)    REACTION: feels funny and "high"    No current facility-administered medications on file prior to encounter.    Current Outpatient Medications on File Prior to Encounter  Medication Sig  . albuterol (PROVENTIL HFA;VENTOLIN HFA) 108 (90 Base) MCG/ACT inhaler Inhale 2 puffs into the lungs every 6 (six) hours as needed for wheezing or shortness of breath.  Marland Kitchen atorvastatin (LIPITOR) 40 MG tablet Take 1 tablet (40 mg total) by mouth daily.  . clopidogrel (PLAVIX) 75 MG tablet Take 1 tablet (75 mg total) daily with breakfast by mouth.  . hydrochlorothiazide (MICROZIDE) 12.5 MG capsule Take 1 capsule (12.5 mg total) daily by mouth.  Marland Kitchen aspirin EC 81 MG EC tablet Take 1 tablet (81 mg total) by mouth daily. (Patient not taking: Reported on 09/11/2017)  . diclofenac sodium (VOLTAREN) 1 % GEL Apply 4 g topically 4 (four) times daily. (Patient not taking: Reported on 09/11/2017)  . HYDROcodone-acetaminophen (NORCO/VICODIN) 5-325 MG tablet Take 1 tablet by mouth 2 (two) times daily as needed for moderate pain. (Patient not taking: Reported on 09/11/2017)  . HYDROcodone-acetaminophen (  NORCO/VICODIN) 5-325 MG tablet Take 1 tablet by mouth 2 (two) times daily as needed for moderate pain. (Patient not  taking: Reported on 09/13/2017)  . HYDROcodone-acetaminophen (NORCO/VICODIN) 5-325 MG tablet Take 1 tablet by mouth every 6 (six) hours as needed for moderate pain. (Patient not taking: Reported on 09/11/2017)  . ondansetron (ZOFRAN ODT) 8 MG disintegrating tablet Take 1 tablet (8 mg total) by mouth every 8 (eight) hours as needed for nausea or vomiting. (Patient not taking: Reported on 09/11/2017)  . polyethylene glycol powder (MIRALAX) powder Take 17 g by mouth daily. (Patient not taking: Reported on 09/11/2017)  . [DISCONTINUED] enoxaparin (LOVENOX) 80 MG/0.8ML SOLN injection Inject 0.8 mLs (80 mg total) into the skin every 12 (twelve) hours.    FAMILY HISTORY:  Her indicated that her mother is deceased. She indicated that her father is deceased. She indicated that the status of her sister is unknown. She indicated that the status of her brother is unknown. She indicated that her maternal grandmother is deceased. She indicated that the status of her maternal uncle is unknown.   SOCIAL HISTORY: She  reports that she quit smoking about 4 weeks ago. Her smoking use included cigarettes. She has a 10.50 pack-year smoking history. She has never used smokeless tobacco. She reports that she does not drink alcohol or use drugs.  REVIEW OF SYSTEMS:   General:possible fevers, chills, sick exposures. HEENT: No sore throat, headache, nasal drainage or sinus congestion  pulmonary: Cough which is improved since stopping smoking, some pleuritic type chest pain over chest wall associated with cough only.  Shortness of breath following cough but has improved.  Denies wheezing. Cardiac: Denies chest pain or palpitations denies focal arm pain Abdomen: No further abdominal discomfort, has had poor p.o. intake particularly over the last 2 months "no appetite" feels like she is lost minimally 12 pounds unintentionally GU: Denies frequency urgency or foul-smelling urine Neuro: Denies headache gait disturbance at this point  feels stronger Endocrine denies hot or cold intolerance  SUBJECTIVE:  Feels better, wants to go home  VITAL SIGNS: Blood Pressure (Abnormal) 123/58 (BP Location: Right Arm)   Pulse 91   Temperature 98.6 F (37 C) (Oral)   Respiration 17   Height 5\' 4"  (1.626 m)   Weight 143 lb 6.6 oz (65.1 kg)   Last Menstrual Period  (LMP Unknown)   Oxygen Saturation 100%   Body Mass Index 24.62 kg/m   HEMODYNAMICS:    VENTILATOR SETTINGS:    INTAKE / OUTPUT: I/O last 3 completed shifts: In: 9381 [P.O.:240; I.V.:1225] Out: 100 [Urine:100]  PHYSICAL EXAMINATION: General:  Is a pleasant 77 year old female resting comfortably in bed she is in no acute distress Neuro: Awake, oriented, no focal deficits she is hard of hearing and legally blind HEENT: normocephalic atraumatic no jugular venous distention she is edentulous Cardiovascular: Regular rate and rhythm Lungs: Clear without accessory use Abdomen:  Soft nontender  Musculoskeletal: Equal strength and bulk Skin:  warm and dry with brisk capillary refill  LABS:  BMET Recent Labs  Lab 09/11/17 0710 09/13/17 1308 09/14/17 0053  NA 134* 135 137  K 4.1 4.2 3.7  CL 97* 100* 103  CO2 24 23 25   BUN 27* 35* 27*  CREATININE 1.79* 2.37* 1.99*  GLUCOSE 112* 113* 147*    Electrolytes Recent Labs  Lab 09/11/17 0710 09/13/17 1308 09/14/17 0053  CALCIUM 9.4 8.8* 8.5*    CBC Recent Labs  Lab 09/11/17 0710 09/13/17 1308 09/14/17 0053  WBC 10.5  13.6* 14.2*  HGB 10.5* 9.3* 8.5*  HCT 32.0* 29.3* 26.9*  PLT 347 335 290    Coag's No results for input(s): APTT, INR in the last 168 hours.  Sepsis Markers Recent Labs  Lab 09/13/17 1315 09/13/17 1432 09/14/17 0053  LATICACIDVEN 0.80 0.96  --   PROCALCITON  --   --  0.40    ABG No results for input(s): PHART, PCO2ART, PO2ART in the last 168 hours.  Liver Enzymes Recent Labs  Lab 09/11/17 0710 09/14/17 0053  AST 11* 12*  ALT 7* 6*  ALKPHOS 57 47  BILITOT 0.4 0.6   ALBUMIN 3.0* 2.3*    Cardiac Enzymes No results for input(s): TROPONINI, PROBNP in the last 168 hours.  Glucose No results for input(s): GLUCAP in the last 168 hours.  Imaging Dg Chest 2 View  Result Date: 09/13/2017 CLINICAL DATA:  Diagnosed with urinary tract infection and pneumonia 2 days ago. Dizziness after starting antibiotics. EXAM: CHEST - 2 VIEW COMPARISON:  Radiographs 09/11/2017 and 01/11/2017. Older studies are also correlated. FINDINGS: The heart size and mediastinal contours are stable. There is aortic atherosclerosis. There is volume loss in the right upper lobe with increasing right apical density. This is new from remote prior studies and likely inflammatory. Patchy basilar opacities bilaterally appears stable. There is no pleural effusion or pneumothorax. No acute osseous findings are seen. IMPRESSION: Progressive right apical airspace disease, most likely reflecting pneumonia. Reactivation tuberculosis should be excluded. Radiographic follow-up after appropriate therapy recommended to exclude neoplasm. Electronically Signed   By: Richardean Sale M.D.   On: 09/13/2017 14:45   Ct Chest Wo Contrast  Result Date: 09/13/2017 CLINICAL DATA:  Abnormal chest radiograph, persistent cough. EXAM: CT CHEST WITHOUT CONTRAST TECHNIQUE: Multidetector CT imaging of the chest was performed following the standard protocol without IV contrast. COMPARISON:  Chest radiograph 09/13/2017 and 01/11/2017. FINDINGS: Cardiovascular: Atherosclerotic calcification of the arterial vasculature, including extensive three-vessel involvement of the coronary arteries. Heart size normal. No pericardial effusion. Mediastinum/Nodes: Mediastinal adenopathy measures up to 1.6 cm in the low right paratracheal station. Right hilar adenopathy is suspected but evaluation is limited without IV contrast. No axillary adenopathy. Esophagus is grossly unremarkable. Lungs/Pleura: Image quality is degraded by respiratory motion.  There is masslike consolidation in the apical and posterior segments of the right upper lobe. Probable mild paraseptal emphysema. Subpleural reticulation and ground-glass are seen with a probable mild basilar predominance. Evaluation is limited due to respiratory motion. No pleural fluid. Airway is unremarkable. Upper Abdomen: Visualized portions of the liver, adrenal glands and right kidney are grossly unremarkable. Low-attenuation lesion in the left kidney measures 5.7 cm and is incompletely imaged. Visualized portions of the spleen, pancreas, stomach and bowel are grossly unremarkable. Cholecystectomy. Musculoskeletal: Degenerative changes in the spine. No worrisome lytic or sclerotic lesions. There may be mild scattered endplate compression deformities, age indeterminate. IMPRESSION: 1. Masslike consolidation in the apical and posterior segments of the right upper lobe, worrisome for malignancy when compared with chest radiographs dating back to 01/11/2017. Pneumonia is not excluded. 2. Mediastinal and suspected right hilar adenopathy. 3. Subpleural pulmonary fibrosis, poorly evaluated due to respiratory motion and lack of high-resolution imaging. 4. Aortic atherosclerosis (ICD10-170.0). Extensive 3 vessel coronary artery calcification. 5.  Emphysema (ICD10-J43.9). Electronically Signed   By: Lorin Picket M.D.   On: 09/13/2017 17:13   US Renal  Result Date: 09/14/2017 CLINICAL DATA:  Hematuria x1 day. Urinary tract infection. Chronic renal disease. EXAM: RENAL / URINARY TRACT ULTRASOUND COMPLETE COMPARISON:  CT from 01/11/2017 FINDINGS: Right Kidney: Length: 9.2 cm. Echogenicity within normal limits. Anechoic simple cysts are noted of the right kidney measuring 1.5 x 1.2 x 1.2 cm in the upper pole and 0.7 cm in diameter in the lower pole. Left Kidney: Length: 9.6 cm. Echogenicity within normal limits. Anechoic exophytic cyst off the lateral left kidney measuring 5.8 x 4.4 x 5 cm is noted. No solid mass or  hydronephrosis visualized. Bladder: Appears normal for degree of bladder distention. IMPRESSION: Bilateral renal cysts.  No obstructive uropathy or nephrolithiasis. Electronically Signed   By: Ashley Royalty M.D.   On: 09/14/2017 01:52    STUDIES:  CT chest:IMPRESSION: 1. Masslike consolidation in the apical and posterior segments of the right upper lobe, worrisome for malignancy when compared with chest radiographs dating back to 01/11/2017. Pneumonia is not excluded. 2. Mediastinal and suspected right hilar adenopathy. 3. Subpleural pulmonary fibrosis, poorly evaluated due to respiratory motion and lack of high-resolution imaging. 4. Aortic atherosclerosis (ICD10-170.0). Extensive 3 vessel coronary artery calcification. 5.  Emphysema (ICD10-J43.9). CULTURES: UC 5/5>>>  ANTIBIOTICS: Rocephin 5/5>>> azith 5/5>>>  SIGNIFICANT EVENTS:   ASSESSMENT / PLAN:  Large RUL consolidative lung mass Post obstructive atelectasis vs pneumonia Recent UTI Acute on chronic renal failure HL HTN  Large right upper lobe consolidative lung mass  Differential diagnosis includes pneumonia, or more likely malignancy with possible postobstructive atelectasis vs PNA given the progression of right upper lobe involvement since September 2018. -Not convinced she has an active pulmonary infection -Procalcitonin negative Plan Continue empiric antibiotics for now, procalcitonin negative We will discuss with pulmonary team, will need bronchoscopy with possible endobronchial ultrasound for lymph node biopsy.  logistically we may need to discharge her before we can set this up, depending on clinician availability   Erick Colace ACNP-BC [place] Pager # [phone] OR # [phone] if no answer  09/14/2017, 11:22 AM

## 2017-09-14 NOTE — Progress Notes (Signed)
Spoke with Dr. Tarri Abernethy about pt being discharged. Pt stated earlier she would like to go home and has now changed her mind. Pt states she would like to stay an extra day due to "not feeling steady on her feet". Dr. Tarri Abernethy aware.

## 2017-09-14 NOTE — Discharge Summary (Signed)
Name: [name] MRN: [id number] DOB: Mar 28, 1941 77 y.o. PCP: Sid Falcon, MD  Date of Admission: 09/13/2017 11:59 AM Date of Discharge:  Attending Physician: Oval Linsey, MD  Discharge Diagnosis: 1. Right upper lobe mass  2. Acute on chronic kidney disease  Principal Problem:   Right upper lobe consolidation ([facility]) Active Problems:   Acute-on-chronic kidney injury ([place])   Mass of upper lobe of right lung   Orthostatic hypotension  Discharge Medications: Allergies as of 09/15/2017      Reactions   Codeine Other (See Comments)   REACTION: Pt c/o "feeling high"   Morphine Other (See Comments)   REACTION: feels funny and "high"      Medication List    STOP taking these medications   HYDROcodone-acetaminophen 5-325 MG tablet Commonly known as:  NORCO/VICODIN     TAKE these medications   albuterol 108 (90 Base) MCG/ACT inhaler Commonly known as:  PROVENTIL HFA;VENTOLIN HFA Inhale 2 puffs into the lungs every 6 (six) hours as needed for wheezing or shortness of breath.   aspirin 81 MG EC tablet Take 1 tablet (81 mg total) by mouth daily.   atorvastatin 40 MG tablet Commonly known as:  LIPITOR Take 1 tablet (40 mg total) by mouth daily.   clopidogrel 75 MG tablet Commonly known as:  PLAVIX Take 1 tablet (75 mg total) daily with breakfast by mouth.   diclofenac sodium 1 % Gel Commonly known as:  VOLTAREN Apply 4 g topically 4 (four) times daily.   hydrochlorothiazide 12.5 MG capsule Commonly known as:  MICROZIDE Take 1 capsule (12.5 mg total) daily by mouth.   ondansetron 8 MG disintegrating tablet Commonly known as:  ZOFRAN ODT Take 1 tablet (8 mg total) by mouth every 8 (eight) hours as needed for nausea or vomiting.   polyethylene glycol powder powder Commonly known as:  MIRALAX Take 17 g by mouth daily.      Disposition and follow-up:   [name] was discharged from [facility] in Stable condition.  At the  hospital follow up visit please address:  1.  Right upper lobe mass. Please ensure the patient has gone to her PET scan and will follow-up with pulmonology on 09/23/17. Normocytic Anemia. Please follow up on the patient's CBC to ensure she is not continuing to have a down trending normocytic anemia. Leukocytosis. Please follow-up on the patient's leukocytosis. On discharge she did not appear clinically ill and we did not have a clear source for her leukocytosis other than her possible malignancy. Although her urin appears dirty she is asymptomatic.   2.  Labs / imaging needed at time of follow-up: CBC  3.  Pending labs/ test needing follow-up: Urine culture, PET scan   Follow-up Appointments: Follow-up Information    Sid Falcon, MD Follow up.   Specialty:  Internal Medicine Contact information: [place] [address] [phone]        Magdalen Spatz, NP .   Specialty:  Pulmonary Disease Contact information: [address] [place] [address] [phone]          Hospital Course by problem list: Principal Problem:   Right upper lobe consolidation [facility]) Active Problems:   Acute-on-chronic kidney injury ([place])   Mass of upper lobe of right lung   Orthostatic hypotension   Right upper lobe mass. [name] is a 77 y.o female with CKD stage III, COPD, PAD, HTN, and prior DVT who  presented to the ED with several months duration of dyspnea, cough, and chest pain. She was previously evaluated in the ED on 5/3 and given Levaquin for PNA/UTI. After taking the Levaquin she became dizzy/lightheaded and presented to the hospital for further evaluation. Repeat CXR illustrated progression of a RUL infiltrate and chest CT illustrated mass like consolidation in the RUL. Pulmonology was consulted who recommended that the patient get a PET scan as an outpatient and follow-up with them in the clinic.   Orthostatic Hypotension. Patient presented with dizziness  and generalized weakness. She endorsed decreased PO intake and orthostatics on admission were positive. She was treated with a fluid bolus and repeat orthostatic vitals were negative. We did hold the patient's HCTZ while hospitalized but resumed it on discharge.   Acute on Chronic Kidney Disease. Patient presented with an acute elevated creatinine to 2.37 from a baseline of 1.4-1.6. This was felt to be pre-renal in etiology and she was treated with IVF. Her creatinine down trended to 1.38 on discharge.   Discharge Vitals:   BP (!) 97/52 (BP Location: Right Arm)   Pulse 87   Temp 98 F (36.7 C) (Oral)   Resp 18   Ht 5\' 4"  (1.626 m)   Wt 143 lb 6.6 oz (65.1 kg)   LMP  (LMP Unknown)   SpO2 98%   BMI 24.62 kg/m   Pertinent Labs, Studies, and Procedures:   CT Chest without Contrast  1. Masslike consolidation in the apical and posterior segments of the right upper lobe, worrisome for malignancy when compared with chest radiographs dating back to 01/11/2017. Pneumonia is not excluded. 2. Mediastinal and suspected right hilar adenopathy. 3. Subpleural pulmonary fibrosis, poorly evaluated due to respiratory motion and lack of high-resolution imaging. 4. Aortic atherosclerosis (ICD10-170.0). Extensive 3 vessel coronary artery calcification. 5.  Emphysema (ICD10-J43.9).  Discharge Instructions: Discharge Instructions    Diet - low sodium heart healthy   Complete by:  As directed    Discharge instructions   Complete by:  As directed    Please follow-up with the internal medicine clinic as soon as possible.   Face-to-face encounter (required for Medicare/Medicaid patients)   Complete by:  As directed    I [name] certify that this patient is under my care and that I, or a nurse practitioner or physician's assistant working with me, had a face-to-face encounter that meets the physician face-to-face encounter requirements with this patient on 09/15/2017. The encounter with the patient was in  whole, or in part for the following medical condition(s) which is the primary reason for home health care (List medical condition): Generalized weakness, legally blind, hard of hearing   The encounter with the patient was in whole, or in part, for the following medical condition, which is the primary reason for home health care:  weakness, blind, hard of hearing   I certify that, based on my findings, the following services are medically necessary home health services:  Physical therapy   Reason for Medically Necessary Home Health Services:   Therapy- Personnel officer, Public librarian Therapy- Instruction on use of Assistive Device for Ambulation on all Surfaces     My clinical findings support the need for the above services:  Unable to leave home safely without assistance and/or assistive device   Further, I certify that my clinical findings support that this patient is homebound due to:  Ambulates short distances less than 300 feet   Home Health   Complete by:  As directed    To provide the following care/treatments:  PT   Increase activity slowly   Complete by:  As directed      Signed: Ina Homes, MD 09/15/2017, 12:54 PM   My Pager: [phone]

## 2017-09-14 NOTE — Progress Notes (Signed)
Internal Medicine Attending  Date: 09/14/2017  Patient name: [name] Medical record number: [id number] Date of birth: 12/08/1940 Age: 77 y.o. Gender: female  I saw and evaluated the patient. I reviewed the resident's note by Dr. Tarri Abernethy and I agree with the resident's findings and plans as documented in his progress note.  Please see my H&P dated 09/14/2017 and attached to Dr. Tally Joe H&P dated 09/13/2017 for the specifics of my evaluation, assessment, and plan from earlier in the day.

## 2017-09-14 NOTE — Progress Notes (Signed)
   Subjective: Patient doing well this AM. No longer feeling dizzy. Feels that it was related to the medicine she was taking. We discussed the findings of her chest x-ray and CT. She states that she does want further work-up for this lesion. We discussed consulting pulmonology for possible EBUS. All questions and concerns addressed.   Objective: Vital signs in last 24 hours: Vitals:   09/13/17 1832 09/13/17 2058 09/13/17 2101 09/13/17 2334  BP: 109/80 110/76  (!) 121/51  Pulse: 92 90  87  Resp: 16 18    Temp: 98.1 F (36.7 C) 98.1 F (36.7 C)  98.3 F (36.8 C)  TempSrc: Oral Oral  Oral  SpO2: (!) 85% 93%  98%  Weight:   143 lb 6.6 oz (65.1 kg)   Height:   5\' 4"  (1.626 m)    General: Well nourished female in no acute distress Pulm: Good air movement with no wheezing or crackles  CV: RRR, no murmurs, no rubs  Extremities: Pulses palpable in all extremities, no LE edema  Skin: Warm and dry  Neuro: Alert and oriented x 3  Assessment/Plan:  [name] is a 77 y.o female with CKD stage III, COPD, PAD, HTN, and prior DVT who presented to the ED with several months duration of dyspnea, cough, and chest pain. She was previously evaluated in the ED on 5/3 and given Levaquin for PNA/UTI. After taking the Levaquin she became dizzy/lightheaded and presented to the hospital for further evaluation. Repeat CXR illustrated progression of a RUL infiltrate and chest CT illustrated mass like consolidation in the RUL.   RUL Consolidation. CXR with right sided volume loss. CT chest illustrated mass like consolidation of the RUL and mediastinal adenopathy. She is currently hemodynamically stable. We have consulted Pulm for EBUS and are holding antibiotics at this point.  - Pulm consult   Normocytic Anemia. Patient presents with new onset normocytic anemia. Baseline previously around 12-14. On admission it was down to 8.5. With an abnormally normal reticulocyte count. This is either due to early course  (not enough time for BM to respond), bone marrow failure (unlikley given other cell lines WNL), or deficiency.  - Continue to trend, likely in the setting of active malignancy - Is not up-to-date on screening (colonoscopy, mammography)  Orthostatic  AKI on CKD Stage III. Patient presented with dizziness and generalized weakness. She was found to be orthostatic positive and have an elevated creatinine to 2.37. Her UA is significant for hematuria and pyuria. Unlikely to be GN without proteinuria. No complaining of urinary symptoms to suggest UTI. She was given fluids with resolution of her symptoms.  - Repeat orthostatic vitals, will give more fluids if positive   - Creatinine trend: 2.37 -> 1.99  COPD  - Continue PRN albuterol   PAD  - Continue Plavix   Chronic Pain  - Continue Norco 5/325 PNR  Dispo: Anticipated discharge in approximately >1 day(s).   Ina Homes, MD 09/14/2017, 5:32 AM My Pager: [phone]

## 2017-09-15 ENCOUNTER — Encounter (HOSPITAL_COMMUNITY): Payer: Self-pay | Admitting: Acute Care

## 2017-09-15 DIAGNOSIS — J449 Chronic obstructive pulmonary disease, unspecified: Secondary | ICD-10-CM

## 2017-09-15 DIAGNOSIS — R531 Weakness: Secondary | ICD-10-CM

## 2017-09-15 DIAGNOSIS — I951 Orthostatic hypotension: Secondary | ICD-10-CM

## 2017-09-15 LAB — BASIC METABOLIC PANEL
ANION GAP: 9 (ref 5–15)
BUN: 21 mg/dL — ABNORMAL HIGH (ref 6–20)
CO2: 24 mmol/L (ref 22–32)
Calcium: 9 mg/dL (ref 8.9–10.3)
Chloride: 106 mmol/L (ref 101–111)
Creatinine, Ser: 1.83 mg/dL — ABNORMAL HIGH (ref 0.44–1.00)
GFR calc non Af Amer: 25 mL/min — ABNORMAL LOW (ref >60)
GFR, EST AFRICAN AMERICAN: 30 mL/min — AB (ref >60)
Glucose, Bld: 130 mg/dL — ABNORMAL HIGH (ref 65–99)
Potassium: 4 mmol/L (ref 3.5–5.1)
Sodium: 139 mmol/L (ref 135–145)

## 2017-09-15 LAB — URINE CULTURE: Culture: >=100000 — AB

## 2017-09-15 LAB — CBC
HCT: 27.3 % — ABNORMAL LOW (ref 36.0–46.0)
Hemoglobin: 8.6 g/dL — ABNORMAL LOW (ref 12.0–15.0)
MCH: 28.9 pg (ref 26.0–34.0)
MCHC: 31.5 g/dL (ref 30.0–36.0)
MCV: 91.6 fL (ref 78.0–100.0)
Platelets: 321 10*3/uL (ref 150–400)
RBC: 2.98 MIL/uL — AB (ref 3.87–5.11)
RDW: 14.3 % (ref 11.5–15.5)
WBC: 16.1 10*3/uL — ABNORMAL HIGH (ref 4.0–10.5)

## 2017-09-15 NOTE — Progress Notes (Signed)
Internal Medicine Attending  Date: 09/15/2017  Patient name: [name] Medical record number: [id number] Date of birth: 1940-06-10 Age: 77 y.o. Gender: female  I saw and evaluated the patient. I reviewed the resident's note by Dr. Tarri Abernethy and I agree with the resident's findings and plans as documented in his progress note.  [name] is ready for discharge home today with outpatient follow-up in pulmonary to further evaluate the right upper lobe lesion.

## 2017-09-15 NOTE — Progress Notes (Signed)
CSW consulted to assist with transportation needs.  Pt has a son but he doesn't get off work till [place] and can't confirm he can get patient because he doesn't have service at his work  Pt attempted to call son's girlfriend but has the wrong number  Pt feels comfortable taking taxi but doesn't have the funds to buy- would have her friend Charlotte Crumb who lives next door help her carry in her things and get settled- CSW spoke with friend to confirm  CSW provided taxi voucher to RN  Pt can leave whenever ready  Jorge Ny, [place] Social Worker [phone]

## 2017-09-15 NOTE — Plan of Care (Signed)
Discussed plan of care with patient.  Discussed the importance of taking her medication as prescribed.  Some teach-back displayed.  Patient hopes to be discharged tomorrow.

## 2017-09-15 NOTE — Consult Note (Signed)
PULMONARY / CRITICAL CARE MEDICINE   Name: [name] MRN: [id number] DOB: [birth year]    ADMISSION DATE:  09/13/2017 CONSULTATION DATE:  5/6  REFERRING MD:  Eppie Gibson   CHIEF COMPLAINT:  Lung mass  HISTORY OF PRESENT ILLNESS:   77 year old female smoker ( Quit 4 weeks ago) with multiple medical problems including chronic kidney disease stage III, COPD, full vascular disease and hypertension also prior DVT.  She is legally blind.  Presented to the emergency room on 5/3 with chief complaint of generalized weakness, abd discomfort, and cough w/ associated chest pain and shortness of breath. These symptoms she recalls started about 2 months ago & were the reason she stopped smoking. She also felt that the shortness of breath and cough was a little better with this. She was diagnosed w/ a UTI (multiple orgs) and PNA and sent home w/ Levaquin. Presented again to the ER on 5/5 w/ cc: dizziness, orthostatic and what she described as feeling "loopy" after taking her levaquin. In the ED she was found to be hypotensive. CXR showed again dense RUL consolidation and CT chest was advised. She was admitted w/ working dx of pneumonia and acute on chronic renal failure. The CT chest was obtained on 5/3 and showed: large apical RUL lung consolidation (actually present to lesser degree back in sept 2018), probable right hilar adenopathy and bibasilar interstitial changes. PCCM was asked to see re: her abnormal CT scan to assist w/ diagnosis of her lung consolidation.     SUBJECTIVE:  Continues to feel better. Feels she is ready to go home.  Wants to have mass worked up. States she has a strong family history of cancer.  VITAL SIGNS: BP (!) 97/52 (BP Location: Right Arm)   Pulse 87   Temp 98 F (36.7 C) (Oral)   Resp 18   Ht 5\' 4"  (1.626 m)   Wt 143 lb 6.6 oz (65.1 kg)   LMP  (LMP Unknown)   SpO2 98%   BMI 24.62 kg/m   HEMODYNAMICS:    VENTILATOR SETTINGS:    INTAKE / OUTPUT: I/O last 3  completed shifts: In: 4174 [P.O.:600; I.V.:850] Out: 25 [Urine:25]  PHYSICAL EXAMINATION: General:  Is a pleasant 77 year old female , awake and alert, sitting on side of bed Neuro: Awake, alert and oriented x 3, no focal deficits she is hard of hearing and legally blind  HEENT: normocephalic atraumatic no jugular venous distention , poor dentation Cardiovascular:S1, S2,  Regular rate and rhythm, No RMG Lungs: Bilateral excursion, diminished per bases, few rhonchi, few wheezes Abdomen:  Soft nontender , BS +, ND Musculoskeletal: Equal strength and bulk, No obvious deformities Skin:  warm and dry with brisk capillary refill, no lesions or rash noted  LABS:  BMET Recent Labs  Lab 09/13/17 1308 09/14/17 0053 09/15/17 0455  NA 135 137 139  K 4.2 3.7 4.0  CL 100* 103 106  CO2 23 25 24   BUN 35* 27* 21*  CREATININE 2.37* 1.99* 1.83*  GLUCOSE 113* 147* 130*    Electrolytes Recent Labs  Lab 09/13/17 1308 09/14/17 0053 09/15/17 0455  CALCIUM 8.8* 8.5* 9.0    CBC Recent Labs  Lab 09/13/17 1308 09/14/17 0053 09/15/17 0455  WBC 13.6* 14.2* 16.1*  HGB 9.3* 8.5* 8.6*  HCT 29.3* 26.9* 27.3*  PLT 335 290 321    Coag's No results for input(s): APTT, INR in the last 168 hours.  Sepsis Markers Recent Labs  Lab 09/13/17 1315 09/13/17 1432 09/14/17 0053  LATICACIDVEN 0.80 0.96  --   PROCALCITON  --   --  0.40    ABG No results for input(s): PHART, PCO2ART, PO2ART in the last 168 hours.  Liver Enzymes Recent Labs  Lab 09/11/17 0710 09/14/17 0053  AST 11* 12*  ALT 7* 6*  ALKPHOS 57 47  BILITOT 0.4 0.6  ALBUMIN 3.0* 2.3*    Cardiac Enzymes No results for input(s): TROPONINI, PROBNP in the last 168 hours.  Glucose No results for input(s): GLUCAP in the last 168 hours.  Imaging No results found.  STUDIES:  CT chest:IMPRESSION: 1. Masslike consolidation in the apical and posterior segments of the right upper lobe, worrisome for malignancy when compared  with chest radiographs dating back to 01/11/2017. Pneumonia is not excluded. 2. Mediastinal and suspected right hilar adenopathy. 3. Subpleural pulmonary fibrosis, poorly evaluated due to respiratory motion and lack of high-resolution imaging. 4. Aortic atherosclerosis (ICD10-170.0). Extensive 3 vessel coronary artery calcification. 5.  Emphysema (ICD10-J43.9). CULTURES: UC 5/5>>>  ANTIBIOTICS: Rocephin 5/5>>> azith 5/5>>>  SIGNIFICANT EVENTS: 09/13/2017>> Admission  ASSESSMENT / PLAN:  Large RUL consolidative lung mass Post obstructive atelectasis vs pneumonia Smoker with 30 + pack year history, quit 4 weeks ago. Recent UTI Acute on chronic renal failure HL HTN  Large right upper lobe consolidative lung mass  Remains afebrile PCT negative  Differential diagnosis includes pneumonia, or more likely malignancy with possible postobstructive atelectasis vs PNA given the progression of right upper lobe involvement since September 2018. -Not convinced she has an active pulmonary infection -Procalcitonin negative Plan No need to remain inpatient per Pulmonary perspective Mass work up can be done as outpatient. Ideally needs outpatient PET scan to better assess location for biopsy attempt ( May be able to be done percutaneously  vs EBUS if PET scan identifies accessible lesion ) with Close  Pulmonary outpatient follow up.  We will arrange for pulmonary follow up. Please place order for PET scan at discharge.  Follow up pulmonary appointment  09/23/2017 at 3 pm with Eric Form, AGACNP-BC.  COPD with emphysema Pulmonary Fibrosis Plan: Continue prn BD Consider PFT's as OP    Magdalen Spatz, [place] # [phone] i  09/15/2017, 10:33 AM

## 2017-09-15 NOTE — Care Management Note (Signed)
Case Management Note  Patient Details  Name: [name] MRN: [id number] Date of Birth: 20-May-1940  Subjective/Objective:  From [facility], she has an aide that is with her 2-3 hrs per day.  Per pt she will need HHPT, she chose [facility] from [place] list, referral given to Butch Penny with [facility]. Soc will begin 24-48 hrs post dc.                  Action/Plan: DC home with [facility] services with [facility].  Expected Discharge Date:  09/15/17               Expected Discharge Plan:  [place]  In-House Referral:     Discharge planning Services  CM Consult  Post Acute Care Choice:  Home Health Choice offered to:  Patient  DME Arranged:    DME Agency:     HH Arranged:  PT [place]:  [place]  Status of Service:  Completed, signed off  If discussed at [place] of Stay Meetings, dates discussed:    Additional Comments:  Zenon Mayo, RN 09/15/2017, 1:01 PM

## 2017-09-15 NOTE — Consult Note (Signed)
            [facility] CM Primary Care Navigator  09/15/2017  Dana NEVARES 13-Nov-1940 [id number]   Attempt toseepatient at the bedside to identify possible discharge needsbutshe was already dischargedhome with home health services.Per chart review, patient is from [facility] with an aide that is with her 2-3 hours per day.  Patient was seen and treated for right upper lobe consolidation and acute on chronic kidney disease.  Providers from the primary care physician's officehave been following patient during this hospitalization.  Patient has discharge instruction to follow-up withprimary care provider and with pulmonary on 09/23/17.   For additional questions please contact:  Edwena Felty A. Burnice Oestreicher, BSN, RN-BC [facility] PRIMARY CARE Navigator Cell: [phone]

## 2017-09-15 NOTE — Evaluation (Signed)
Physical Therapy Evaluation Patient Details Name: [name] MRN: [id number] DOB: 03-15-1941 Today's Date: 09/15/2017   History of Present Illness  Pt is a 77 y.o. female admitted 09/13/17 with c/o dizziness after starting Levaquin for a presumed recently diagnosed right upper lobe pneumonia; found to be hypotensive. Chest CT concerning for RUL malignancy; mediastinal and suspected R hilar adenopathy. PMH includes HTN, PAD, CKD III, tobacco abuse, legal blindness.    Clinical Impression  Pt presents with an overall decrease in functional mobility secondary to above. PTA, pt lives alone; mod indep amb with rollator; has home aide 5x/wk assist with bathing and household activities. Reports can have assist from son if needed at d/c. Today, amb 150' with RW and supervision for safety; mod indep with transfers. Pt reports no questions or concerns regarding mobility at home; feels she has necessary assist and equipment. Pt would benefit from continued acute PT services to maximize functional mobility and independence prior to d/c with HHPT services.     Follow Up Recommendations Home health PT;Supervision - Intermittent    Equipment Recommendations  None recommended by PT    Recommendations for Other Services       Precautions / Restrictions Precautions Precautions: Fall Restrictions Weight Bearing Restrictions: No      Mobility  Bed Mobility Overal bed mobility: Independent                Transfers Overall transfer level: Needs assistance Equipment used: Rolling walker (2 wheeled) Transfers: Stand Pivot Transfers Sit to Stand: Modified independent (Device/Increase time) Stand pivot transfers: Modified independent (Device/Increase time)          Ambulation/Gait Ambulation/Gait assistance: Supervision Ambulation Distance (Feet): 150 Feet Assistive device: Rolling walker (2 wheeled) Gait Pattern/deviations: Step-through pattern;Decreased stride length;Trunk  flexed Gait velocity: Decreased Gait velocity interpretation: <1.8 ft/sec, indicate of risk for recurrent falls General Gait Details: Slow, steady amb with RW and supervision for balance; pt able to navigate unfamiliar territory well, did not require any cues for vision  Stairs            Wheelchair Mobility    Modified Rankin (Stroke Patients Only)       Balance Overall balance assessment: Needs assistance   Sitting balance-Leahy Scale: Good Sitting balance - Comments: Indep to don/doff shoes sitting EOB; indep for pericare sitting on BSC     Standing balance-Leahy Scale: Fair Standing balance comment: Can static stand and take steps with no UE support                             Pertinent Vitals/Pain Pain Assessment: No/denies pain    Home Living Family/patient expects to be discharged to:: Private residence Living Arrangements: Alone Available Help at Discharge: Family;Personal care attendant;Available PRN/intermittently Type of Home: Apartment Home Access: Level entry     Home Layout: One level Home Equipment: Walker - 4 wheels      Prior Function Level of Independence: Needs assistance   Gait / Transfers Assistance Needed: Mod indep amb household distances with rollator. Relies on [employer] for transportation; son does grocery shopping  ADL's / Homemaking Assistance Needed: [employer] aide M-F for 3 hrs/day, assist with all bathing, cooking, and cleaning.         Hand Dominance        Extremity/Trunk Assessment   Upper Extremity Assessment Upper Extremity Assessment: Overall WFL for tasks assessed    Lower Extremity Assessment Lower Extremity Assessment: Overall [facility]  for tasks assessed    Cervical / Trunk Assessment Cervical / Trunk Assessment: Kyphotic  Communication   Communication: HOH  Cognition Arousal/Alertness: Awake/alert Behavior During Therapy: WFL for tasks assessed/performed Overall Cognitive Status: Within Functional Limits for  tasks assessed                                        General Comments      Exercises     Assessment/Plan    PT Assessment Patient needs continued PT services  PT Problem List Decreased strength;Decreased activity tolerance;Decreased balance;Decreased mobility;Decreased cognition       PT Treatment Interventions DME instruction;Gait training;Functional mobility training;Therapeutic activities;Therapeutic exercise;Balance training;Patient/family education    PT Goals (Current goals can be found in the Care Plan section)  Acute Rehab PT Goals Patient Stated Goal: Return home PT Goal Formulation: With patient Time For Goal Achievement: 09/29/17 Potential to Achieve Goals: Good    Frequency Min 3X/week   Barriers to discharge Decreased caregiver support      Co-evaluation               AM-PAC PT "6 Clicks" Daily Activity  Outcome Measure Difficulty turning over in bed (including adjusting bedclothes, sheets and blankets)?: None Difficulty moving from lying on back to sitting on the side of the bed? : None Difficulty sitting down on and standing up from a chair with arms (e.g., wheelchair, bedside commode, etc,.)?: None Help needed moving to and from a bed to chair (including a wheelchair)?: A Little Help needed walking in hospital room?: A Little Help needed climbing 3-5 steps with a railing? : A Little 6 Click Score: 21    End of Session Equipment Utilized During Treatment: Gait belt Activity Tolerance: Patient tolerated treatment well Patient left: in bed;with call bell/phone within reach;with bed alarm set Nurse Communication: Mobility status PT Visit Diagnosis: Other abnormalities of gait and mobility (R26.89)    Time: 3220-2542 PT Time Calculation (min) (ACUTE ONLY): 27 min   Charges:   PT Evaluation $PT Eval Moderate Complexity: 1 Mod PT Treatments $Therapeutic Activity: 8-22 mins   PT G Codes:       Mabeline Caras, PT, DPT Acute  Rehab Services  Pager: [place] 09/15/2017, 11:12 AM

## 2017-09-15 NOTE — Discharge Instructions (Signed)
Thank you for allowing [place] to provide your care.   The clinic (Dr. Doristine Section office) will be calling you with an appointment.   You have an appointment with [name] on 5/15 at 3:15 pm. Please go to this appointment so we can continue to evaluate the spot on your lung.

## 2017-09-15 NOTE — Progress Notes (Addendum)
   Subjective: Patient was wanting to leave yesterday; however, again felt weak with standing. Orthostatic vital signs were normal. Discussed PT evaluation prior to discharge today. She is otherwise doing well and okay with discharge today. She is requesting a ride home as she does not have a ride home. Discussed outpatient pulm follow-up. All questions and concerns addressed.   Objective: Vital signs in last 24 hours: Vitals:   09/14/17 1559 09/14/17 2200 09/15/17 0235 09/15/17 0734  BP: (!) 79/53 107/61 (!) 121/53 (!) 97/52  Pulse: 90 87  87  Resp: 18 18  18   Temp: 98.2 F (36.8 C) 98.5 F (36.9 C)  98 F (36.7 C)  TempSrc: Oral Oral  Oral  SpO2: 97% 100%  98%  Weight:      Height:       General: Thin elderly female in no acute distress Pulm: Good air movement with no wheezing or crackles  CV: RRR, no murmurs, no rubs   Extremities: Pulses palpable in all extremities, no LE edema  Neuro: Alert and oriented x 3  Assessment/Plan:  [name] is a 77 y.o female with CKD stage III, COPD, PAD, HTN, and prior DVT who presented to the ED with several months duration of dyspnea, cough, and chest pain. She was previously evaluated in the ED on 5/3 and given Levaquin for PNA/UTI. After taking the Levaquin she became dizzy/lightheaded and presented to the hospital for further evaluation. Repeat CXR illustrated progression of a RUL infiltrate and chest CT illustrated mass like consolidation in the RUL.   RUL Consolidation. CXR with right sided volume loss. CT chest illustrated mass like consolidation of the RUL and mediastinal adenopathy. She is currently hemodynamically stable. We have consulted Pulm for further work-up. At this point the patient is hemodynamically stable and it is felt that the work-up for her RUL consolidation can be completed as an outpatient.  - Holding abx with flat fever curve, no clinical signs of PNA, and low procalcitonin  - Pulm recommends outpatient work-up and  PET scan   Normocytic Anemia. Patient presents with new onset normocytic anemia. Baseline previously around 12-14. On admission it was down to 8.5. With an abnormally normal reticulocyte count. This is either due to early course (not enough time for BM to respond), bone marrow failure (unlikley given other cell lines WNL), or deficiency.  - Continue to trend, likely in the setting of active malignancy - Is not up-to-date on screening (colonoscopy, mammography)  Orthostatic  Weakness AKI on CKD Stage III. Patient presented with dizziness and generalized weakness. She was found to be orthostatic positive and have an elevated creatinine to 2.37. Her UA is significant for hematuria and pyuria. Unlikely to be GN without proteinuria. No complaining of urinary symptoms to suggest UTI. She was given fluids with resolution of her symptoms.  - Will follow urine cultures  - Repeat orthostatic vitals were normal   - Continue holding HTN medication with low BP  - Creatinine trend: 2.37 -> 1.99 -> 1.38 - PT recommending home heath PT. Will order on discharge.  COPD  - Continue PRN albuterol   PAD  - Continue Plavix   Chronic Pain  - Continue Norco 5/325 PNR  Dispo: Anticipated discharge today after PT evaluation.   Dana Homes, MD 09/15/2017, 11:27 AM My Pager: [phone]

## 2017-09-16 ENCOUNTER — Ambulatory Visit: Payer: Medicare Other | Admitting: Cardiovascular Disease

## 2017-09-17 ENCOUNTER — Encounter: Payer: Self-pay | Admitting: *Deleted

## 2017-09-18 DIAGNOSIS — I739 Peripheral vascular disease, unspecified: Secondary | ICD-10-CM | POA: Diagnosis not present

## 2017-09-18 DIAGNOSIS — M19012 Primary osteoarthritis, left shoulder: Secondary | ICD-10-CM | POA: Diagnosis not present

## 2017-09-18 DIAGNOSIS — Z87891 Personal history of nicotine dependence: Secondary | ICD-10-CM | POA: Diagnosis not present

## 2017-09-18 DIAGNOSIS — I129 Hypertensive chronic kidney disease with stage 1 through stage 4 chronic kidney disease, or unspecified chronic kidney disease: Secondary | ICD-10-CM | POA: Diagnosis not present

## 2017-09-18 DIAGNOSIS — R918 Other nonspecific abnormal finding of lung field: Secondary | ICD-10-CM | POA: Diagnosis not present

## 2017-09-18 DIAGNOSIS — J8410 Pulmonary fibrosis, unspecified: Secondary | ICD-10-CM | POA: Diagnosis not present

## 2017-09-18 DIAGNOSIS — Z7902 Long term (current) use of antithrombotics/antiplatelets: Secondary | ICD-10-CM | POA: Diagnosis not present

## 2017-09-18 DIAGNOSIS — I2510 Atherosclerotic heart disease of native coronary artery without angina pectoris: Secondary | ICD-10-CM | POA: Diagnosis not present

## 2017-09-18 DIAGNOSIS — J439 Emphysema, unspecified: Secondary | ICD-10-CM | POA: Diagnosis not present

## 2017-09-18 DIAGNOSIS — Z86718 Personal history of other venous thrombosis and embolism: Secondary | ICD-10-CM | POA: Diagnosis not present

## 2017-09-18 DIAGNOSIS — N183 Chronic kidney disease, stage 3 (moderate): Secondary | ICD-10-CM | POA: Diagnosis not present

## 2017-09-18 DIAGNOSIS — D631 Anemia in chronic kidney disease: Secondary | ICD-10-CM | POA: Diagnosis not present

## 2017-09-18 DIAGNOSIS — H548 Legal blindness, as defined in USA: Secondary | ICD-10-CM | POA: Diagnosis not present

## 2017-09-18 DIAGNOSIS — Z7982 Long term (current) use of aspirin: Secondary | ICD-10-CM | POA: Diagnosis not present

## 2017-09-18 DIAGNOSIS — H9190 Unspecified hearing loss, unspecified ear: Secondary | ICD-10-CM | POA: Diagnosis not present

## 2017-09-18 DIAGNOSIS — I700 Atherosclerosis of aorta: Secondary | ICD-10-CM | POA: Diagnosis not present

## 2017-09-18 DIAGNOSIS — E785 Hyperlipidemia, unspecified: Secondary | ICD-10-CM | POA: Diagnosis not present

## 2017-09-23 ENCOUNTER — Encounter: Payer: Self-pay | Admitting: Acute Care

## 2017-09-23 ENCOUNTER — Ambulatory Visit (INDEPENDENT_AMBULATORY_CARE_PROVIDER_SITE_OTHER): Payer: Medicare Other | Admitting: Acute Care

## 2017-09-23 VITALS — BP 102/60 | HR 77 | Ht 64.0 in | Wt 134.4 lb

## 2017-09-23 DIAGNOSIS — R918 Other nonspecific abnormal finding of lung field: Secondary | ICD-10-CM | POA: Diagnosis not present

## 2017-09-23 DIAGNOSIS — R911 Solitary pulmonary nodule: Secondary | ICD-10-CM

## 2017-09-23 DIAGNOSIS — J449 Chronic obstructive pulmonary disease, unspecified: Secondary | ICD-10-CM

## 2017-09-23 DIAGNOSIS — J181 Lobar pneumonia, unspecified organism: Secondary | ICD-10-CM | POA: Diagnosis not present

## 2017-09-23 NOTE — Progress Notes (Signed)
History of Present Illness [name] is a 77 y.o. female former smoker with new mass like consolidation on CT Chest. She was seen as an inpatient in the hospital. She was seen by PCCM as an inpatient.   HISTORY OF PRESENT ILLNESS:   77 year old female smoker ( Quit 4 weeks ago) with multiple medical problems including chronic kidney disease stage III, COPD, full vascular disease and hypertension also prior DVT.  She is legally blind.  Presented to the emergency room on 5/3 with chief complaint of generalized weakness, abd discomfort, and cough w/ associated chest pain and shortness of breath. These symptoms she recalls started about 2 months ago & were the reason she stopped smoking. She also felt that the shortness of breath and cough was a little better with this. She was diagnosed w/ a UTI (multiple orgs) and PNA and sent home w/ Levaquin. Presented again to the ER on 5/5 w/ cc: dizziness, orthostatic and what she described as feeling "loopy" after taking her levaquin. In the ED she was found to be hypotensive. CXR showed again dense RUL consolidation and CT chest was advised. She was admitted w/ working dx of pneumonia and acute on chronic renal failure. The CT chest was obtained on 5/3 and showed: large apical RUL lung consolidation (actually present to lesser degree back in sept 2018), probable right hilar adenopathy and bibasilar interstitial changes. PCCM was asked to see re: her abnormal CT scan to assist w/ diagnosis of her lung consolidation.   09/23/2017 Hospital Follow up Pt. Presents for hospital follow up. She was admitted 09/13/2017-09/14/2017. She had a previous ED visit on 5/3 for a UTI.She was admitted 09/13/2017 for generalized weakness and cough with associated chest pain and shortness of breath. She endorses a weight loss of 170 pounds in the last year. Large pulmonary mass was evident on CT Chest. She responded to antibiotic  treatment , and was deemed ready for discharge with  further work up of pulmonary mass to be done as an outpatient. She states her breathing is better. She states she has poor  appetite.She states she had originally  been trying to lose weight , but she has lost additional weight due to lack of appetite. She coughs up greenish secretions. She states she had had this cough for about 2 months. She states she has been using her rescue inhaler for shortness of breath about once daily.She states she has had no fever since discharged. She is swallowing a lot today, but states this is normal for her.She denies any swelling in her ankles. She states she does have chest pain with cough only.. She denies hemoptysis. She is legally blind and she is here alone today. She states she lives alone and her son helps her with grocery shopping.She has a very difficult time understanding that we are concerned this mass in her lung could be cancer. She states she does not believe that God would help her quit smoking just to have her diagnosed with cancer. There is  Confusion about medications. We reviewed all meds and how to take them. Additionally we reviewed use of rescue inhaler and technique.She was discharged with home PT due to weakness after short hospital stay.  Test Results: CT chest:IMPRESSION:09/13/2017 1. Masslike consolidation in the apical and posterior segments of the right upper lobe, worrisome for malignancy when compared with chest radiographs dating back to 01/11/2017. Pneumonia is not excluded. 2. Mediastinal and suspected right hilar adenopathy. 3. Subpleural pulmonary fibrosis, poorly evaluated  due to respiratory motion and lack of high-resolution imaging. 4. Aortic atherosclerosis (ICD10-170.0). Extensive 3 vessel coronary artery calcification. 5. Emphysema (ICD10-J43.9). CULTURES: UC 5/5>>>  ANTIBIOTICS: Rocephin 5/5>>> azith 5/5>>>  SIGNIFICANT EVENTS: 09/13/2017>> Admission    CBC Latest Ref Rng & Units 09/15/2017 09/14/2017 09/13/2017  WBC 4.0  - 10.5 K/uL 16.1(H) 14.2(H) 13.6(H)  Hemoglobin 12.0 - 15.0 g/dL 8.6(L) 8.5(L) 9.3(L)  Hematocrit 36.0 - 46.0 % 27.3(L) 26.9(L) 29.3(L)  Platelets 150 - 400 K/uL 321 290 335    BMP Latest Ref Rng & Units 09/15/2017 09/14/2017 09/13/2017  Glucose 65 - 99 mg/dL 130(H) 147(H) 113(H)  BUN 6 - 20 mg/dL 21(H) 27(H) 35(H)  Creatinine 0.44 - 1.00 mg/dL 1.83(H) 1.99(H) 2.37(H)  BUN/Creat Ratio 12 - 28 - - -  Sodium 135 - 145 mmol/L 139 137 135  Potassium 3.5 - 5.1 mmol/L 4.0 3.7 4.2  Chloride 101 - 111 mmol/L 106 103 100(L)  CO2 22 - 32 mmol/L 24 25 23   Calcium 8.9 - 10.3 mg/dL 9.0 8.5(L) 8.8(L)    BNP    Component Value Date/Time   BNP 10.6 02/09/2015 1942    ProBNP    Component Value Date/Time   PROBNP 31.7 02/20/2014 1918    PFT No results found for: FEV1PRE, FEV1POST, FVCPRE, FVCPOST, TLC, DLCOUNC, PREFEV1FVCRT, PSTFEV1FVCRT  Dg Chest 2 View  Result Date: 09/13/2017 CLINICAL DATA:  Diagnosed with urinary tract infection and pneumonia 2 days ago. Dizziness after starting antibiotics. EXAM: CHEST - 2 VIEW COMPARISON:  Radiographs 09/11/2017 and 01/11/2017. Older studies are also correlated. FINDINGS: The heart size and mediastinal contours are stable. There is aortic atherosclerosis. There is volume loss in the right upper lobe with increasing right apical density. This is new from remote prior studies and likely inflammatory. Patchy basilar opacities bilaterally appears stable. There is no pleural effusion or pneumothorax. No acute osseous findings are seen. IMPRESSION: Progressive right apical airspace disease, most likely reflecting pneumonia. Reactivation tuberculosis should be excluded. Radiographic follow-up after appropriate therapy recommended to exclude neoplasm. Electronically Signed   By: Richardean Sale M.D.   On: 09/13/2017 14:45   Dg Chest 2 View  Result Date: 09/11/2017 CLINICAL DATA:  77 year old female with abdominal pain. EXAM: CHEST - 2 VIEW; ABDOMEN - 2 VIEW COMPARISON:   CT of the abdomen pelvis dated 01/11/2017 and chest radiograph dated 03/06/2016 FINDINGS: Diffuse interstitial coarsening most consistent with underlying pulmonary fibrosis. Clinical correlation is recommended. Focal area of increased density in the right apical region appears new compared to prior radiograph and may represent superimposed pneumonia. Clinical correlation and follow-up recommended. There is no pleural effusion or pneumothorax. The cardiac silhouette is within normal limits. There is atherosclerotic calcification of the aortic arch. There is no bowel dilatation or evidence of obstruction. No free air or radiopaque calculi. Right upper quadrant cholecystectomy clips. There is osteopenia with degenerative changes of the spine. No acute osseous pathology. IMPRESSION: 1. Chronic interstitial prominence likely related to underlying pulmonary fibrosis. Right apical airspace density appears new compared to the prior radiograph and may represent superimposed pneumonia. Clinical correlation and follow-up recommended. 2. No evidence of bowel obstruction. Electronically Signed   By: Anner Crete M.D.   On: 09/11/2017 06:43   Ct Chest Wo Contrast  Result Date: 09/13/2017 CLINICAL DATA:  Abnormal chest radiograph, persistent cough. EXAM: CT CHEST WITHOUT CONTRAST TECHNIQUE: Multidetector CT imaging of the chest was performed following the standard protocol without IV contrast. COMPARISON:  Chest radiograph 09/13/2017 and 01/11/2017. FINDINGS: Cardiovascular: Atherosclerotic  calcification of the arterial vasculature, including extensive three-vessel involvement of the coronary arteries. Heart size normal. No pericardial effusion. Mediastinum/Nodes: Mediastinal adenopathy measures up to 1.6 cm in the low right paratracheal station. Right hilar adenopathy is suspected but evaluation is limited without IV contrast. No axillary adenopathy. Esophagus is grossly unremarkable. Lungs/Pleura: Image quality is  degraded by respiratory motion. There is masslike consolidation in the apical and posterior segments of the right upper lobe. Probable mild paraseptal emphysema. Subpleural reticulation and ground-glass are seen with a probable mild basilar predominance. Evaluation is limited due to respiratory motion. No pleural fluid. Airway is unremarkable. Upper Abdomen: Visualized portions of the liver, adrenal glands and right kidney are grossly unremarkable. Low-attenuation lesion in the left kidney measures 5.7 cm and is incompletely imaged. Visualized portions of the spleen, pancreas, stomach and bowel are grossly unremarkable. Cholecystectomy. Musculoskeletal: Degenerative changes in the spine. No worrisome lytic or sclerotic lesions. There may be mild scattered endplate compression deformities, age indeterminate. IMPRESSION: 1. Masslike consolidation in the apical and posterior segments of the right upper lobe, worrisome for malignancy when compared with chest radiographs dating back to 01/11/2017. Pneumonia is not excluded. 2. Mediastinal and suspected right hilar adenopathy. 3. Subpleural pulmonary fibrosis, poorly evaluated due to respiratory motion and lack of high-resolution imaging. 4. Aortic atherosclerosis (ICD10-170.0). Extensive 3 vessel coronary artery calcification. 5.  Emphysema (ICD10-J43.9). Electronically Signed   By: Lorin Picket M.D.   On: 09/13/2017 17:13   US Renal  Result Date: 09/14/2017 CLINICAL DATA:  Hematuria x1 day. Urinary tract infection. Chronic renal disease. EXAM: RENAL / URINARY TRACT ULTRASOUND COMPLETE COMPARISON:  CT from 01/11/2017 FINDINGS: Right Kidney: Length: 9.2 cm. Echogenicity within normal limits. Anechoic simple cysts are noted of the right kidney measuring 1.5 x 1.2 x 1.2 cm in the upper pole and 0.7 cm in diameter in the lower pole. Left Kidney: Length: 9.6 cm. Echogenicity within normal limits. Anechoic exophytic cyst off the lateral left kidney measuring 5.8 x 4.4 x  5 cm is noted. No solid mass or hydronephrosis visualized. Bladder: Appears normal for degree of bladder distention. IMPRESSION: Bilateral renal cysts.  No obstructive uropathy or nephrolithiasis. Electronically Signed   By: Ashley Royalty M.D.   On: 09/14/2017 01:52   Dg Abd 2 Views  Result Date: 09/11/2017 CLINICAL DATA:  77 year old female with abdominal pain. EXAM: CHEST - 2 VIEW; ABDOMEN - 2 VIEW COMPARISON:  CT of the abdomen pelvis dated 01/11/2017 and chest radiograph dated 03/06/2016 FINDINGS: Diffuse interstitial coarsening most consistent with underlying pulmonary fibrosis. Clinical correlation is recommended. Focal area of increased density in the right apical region appears new compared to prior radiograph and may represent superimposed pneumonia. Clinical correlation and follow-up recommended. There is no pleural effusion or pneumothorax. The cardiac silhouette is within normal limits. There is atherosclerotic calcification of the aortic arch. There is no bowel dilatation or evidence of obstruction. No free air or radiopaque calculi. Right upper quadrant cholecystectomy clips. There is osteopenia with degenerative changes of the spine. No acute osseous pathology. IMPRESSION: 1. Chronic interstitial prominence likely related to underlying pulmonary fibrosis. Right apical airspace density appears new compared to the prior radiograph and may represent superimposed pneumonia. Clinical correlation and follow-up recommended. 2. No evidence of bowel obstruction. Electronically Signed   By: Anner Crete M.D.   On: 09/11/2017 06:43     Past medical hx Past Medical History:  Diagnosis Date  . Arthritis    "left shoulder" (04/07/2016)  . Blind  s/p bilateral corneal transplant; "legally blind in both eyes" (04/07/2016)  . CKD (chronic kidney disease), stage III ([place])   . COPD (chronic obstructive pulmonary disease) ([place])   . DVT (deep venous thrombosis) ([place]) "years ago"   "right thigh"    .  HEARING LOSS   . Hyperlipidemia   . Hypertension   . PAD (peripheral artery disease) ([place])    a. 03/2016 Periph Angio: L SFA 99p, 33m w/ one vessel runoff. R SFA 123m w/ one vessel runofff;  b. 04/07/2016 PTA L SFA ([place] 1 LS atherecromty w/ DEBA). c. 04/24/16 s/p PTA to R SFA, right tibioperoneal trunk and posterior tibial artery.  . Phlebitis   . Tobacco abuse      Social History   Tobacco Use  . Smoking status: Former Smoker    Packs/day: 0.50    Years: 62.00    Pack years: 31.00    Types: Cigarettes    Last attempt to quit: 08/12/2017    Years since quitting: 0.1  . Smokeless tobacco: Never Used  . Tobacco comment: Sometimes less-6 7 CIGARETTES A DAY; uses patches sometimes.  Substance Use Topics  . Alcohol use: No    Alcohol/week: 0.0 oz  . Drug use: No    Ms.Hockley reports that she quit smoking about 6 weeks ago. Her smoking use included cigarettes. She has a 31.00 pack-year smoking history. She has never used smokeless tobacco. She reports that she does not drink alcohol or use drugs.  Tobacco Cessation: Former smoker quit 08/2017  Past surgical hx, Family hx, Social hx all reviewed.  Current Outpatient Medications on File Prior to Visit  Medication Sig  . albuterol (PROVENTIL HFA;VENTOLIN HFA) 108 (90 Base) MCG/ACT inhaler Inhale 2 puffs into the lungs every 6 (six) hours as needed for wheezing or shortness of breath.  Marland Kitchen aspirin EC 81 MG EC tablet Take 1 tablet (81 mg total) by mouth daily.  Marland Kitchen atorvastatin (LIPITOR) 40 MG tablet Take 1 tablet (40 mg total) by mouth daily.  . clopidogrel (PLAVIX) 75 MG tablet Take 1 tablet (75 mg total) daily with breakfast by mouth.  . diclofenac sodium (VOLTAREN) 1 % GEL Apply 4 g topically 4 (four) times daily.  . hydrochlorothiazide (MICROZIDE) 12.5 MG capsule Take 1 capsule (12.5 mg total) daily by mouth.  . ondansetron (ZOFRAN ODT) 8 MG disintegrating tablet Take 1 tablet (8 mg total) by mouth every 8 (eight) hours as needed for  nausea or vomiting.  . polyethylene glycol powder (MIRALAX) powder Take 17 g by mouth daily.  . [DISCONTINUED] enoxaparin (LOVENOX) 80 MG/0.8ML SOLN injection Inject 0.8 mLs (80 mg total) into the skin every 12 (twelve) hours.   No current facility-administered medications on file prior to visit.      Allergies  Allergen Reactions  . Codeine Other (See Comments)    REACTION: Pt c/o "feeling high"  . Morphine Other (See Comments)    REACTION: feels funny and "high"    Review Of Systems:  Constitutional:   +  weight loss,No  night sweats,  Fevers, chills, + fatigue, or  lassitude.  HEENT:   No headaches,  Difficulty swallowing,  Tooth/dental problems, or  Sore throat,                No sneezing, itching, ear ache, nasal congestion, post nasal drip,   CV:  + chest pain with cough,  No Orthopnea, PND, swelling in lower extremities, anasarca, dizziness, palpitations, syncope.   GI  No heartburn,  indigestion, abdominal pain, nausea, vomiting, diarrhea, change in bowel habits, loss of appetite, bloody stools.   Resp: + shortness of breath with exertion not  at rest.  No excess mucus, no productive cough,  No non-productive cough,  No coughing up of blood.  + change in color of mucus.  No wheezing.  No chest wall deformity  Skin: no rash or lesions.  GU: no dysuria, change in color of urine, no urgency or frequency.  No flank pain, no hematuria   MS:  No joint pain or swelling.  No decreased range of motion.  No back pain.  Psych:  No change in mood or affect. No depression or anxiety.  No memory loss.   Vital Signs BP 102/60 (BP Location: Left Arm, Cuff Size: Normal)   Pulse 77   Ht 5\' 4"  (1.626 m)   Wt 134 lb 6.4 oz (61 kg)   LMP  (LMP Unknown)   SpO2 95%   BMI 23.07 kg/m    Physical Exam:  General- No distress,  A&Ox3, pleasant, legally blind ENT: No sinus tenderness, TM clear, pale nasal mucosa, no oral exudate,no post nasal drip, no LAN Cardiac: S1, S2, regular rate  and rhythm, no murmur Chest: No wheeze/ rales/ dullness; no accessory muscle use, no nasal flaring, no sternal retractions, productive cough Abd.: Soft Non-tender, ND, BS +, Very thin Ext: No clubbing cyanosis, edema Neuro:  Deconditioned at baseline Skin: No rashes, warm and dry Psych: normal mood and behavior   Assessment/Plan  Right upper lobe consolidation (HCC) We will schedule you for a PET scan. This will help [place] determine the best way to biopsy the lung mass.  We will schedule you for  PFT's. Follow up in 2 weeks with Dr.Byrum or Exavier Lina NP We will mke sure PFT's and PET scan are ordered prior. Please contact office for sooner follow up if symptoms do not improve or worsen or seek emergency care  COPD (chronic obstructive pulmonary disease) ([place]) Currently not on maintenance inhaler for COPD Using her rescue inhaler 1 x daily Plan: Will need maintenance inhaler once PFT's can grade severity. With her medical illiteracy and lack or resources I do not want to start maintenance until we have time for extensive teaching. Will plan to do this at her follow up appointment in 2 weeks Please contact office for sooner follow up if symptoms do not improve or worsen or seek emergency care     Magdalen Spatz, NP 09/23/2017  11:39 PM

## 2017-09-23 NOTE — Assessment & Plan Note (Addendum)
We will schedule you for a PET scan. This will help [place] determine the best way to biopsy the lung mass.  We will schedule you for  PFT's. Follow up in 2 weeks with Dr.Byrum or Sarah NP We will mke sure PFT's and PET scan are ordered prior. Please contact office for sooner follow up if symptoms do not improve or worsen or seek emergency care

## 2017-09-23 NOTE — Assessment & Plan Note (Signed)
Currently not on maintenance inhaler for COPD Using her rescue inhaler 1 x daily Plan: Will need maintenance inhaler once PFT's can grade severity. With her medical illiteracy and lack or resources I do not want to start maintenance until we have time for extensive teaching. Will plan to do this at her follow up appointment in 2 weeks Please contact office for sooner follow up if symptoms do not improve or worsen or seek emergency care

## 2017-09-23 NOTE — Patient Instructions (Addendum)
It is nice to meet you today. We will schedule you for a PET scan. This will help [place] determine the best way to biopsy the lung mass.  We will schedule you for  PFT's. Use your rescue inhaler as needed for shortness of breath or wheezing. Use this 2 puffs as needed up to 4 times a day for shortness of breath or wheezing. Follow up with Dr. Lamonte Sakai or Judson Roch NP in 2 weeks, PFT's and PET need to be done before. Please contact office for sooner follow up if symptoms do not improve or worsen or seek emergency care

## 2017-09-25 DIAGNOSIS — N183 Chronic kidney disease, stage 3 (moderate): Secondary | ICD-10-CM | POA: Diagnosis not present

## 2017-09-25 DIAGNOSIS — Z7982 Long term (current) use of aspirin: Secondary | ICD-10-CM | POA: Diagnosis not present

## 2017-09-25 DIAGNOSIS — D631 Anemia in chronic kidney disease: Secondary | ICD-10-CM | POA: Diagnosis not present

## 2017-09-25 DIAGNOSIS — E785 Hyperlipidemia, unspecified: Secondary | ICD-10-CM | POA: Diagnosis not present

## 2017-09-25 DIAGNOSIS — R918 Other nonspecific abnormal finding of lung field: Secondary | ICD-10-CM | POA: Diagnosis not present

## 2017-09-25 DIAGNOSIS — I700 Atherosclerosis of aorta: Secondary | ICD-10-CM | POA: Diagnosis not present

## 2017-09-25 DIAGNOSIS — I2510 Atherosclerotic heart disease of native coronary artery without angina pectoris: Secondary | ICD-10-CM | POA: Diagnosis not present

## 2017-09-25 DIAGNOSIS — Z86718 Personal history of other venous thrombosis and embolism: Secondary | ICD-10-CM | POA: Diagnosis not present

## 2017-09-25 DIAGNOSIS — H9190 Unspecified hearing loss, unspecified ear: Secondary | ICD-10-CM | POA: Diagnosis not present

## 2017-09-25 DIAGNOSIS — Z87891 Personal history of nicotine dependence: Secondary | ICD-10-CM | POA: Diagnosis not present

## 2017-09-25 DIAGNOSIS — I129 Hypertensive chronic kidney disease with stage 1 through stage 4 chronic kidney disease, or unspecified chronic kidney disease: Secondary | ICD-10-CM | POA: Diagnosis not present

## 2017-09-25 DIAGNOSIS — Z7902 Long term (current) use of antithrombotics/antiplatelets: Secondary | ICD-10-CM | POA: Diagnosis not present

## 2017-09-25 DIAGNOSIS — I739 Peripheral vascular disease, unspecified: Secondary | ICD-10-CM | POA: Diagnosis not present

## 2017-09-25 DIAGNOSIS — J8410 Pulmonary fibrosis, unspecified: Secondary | ICD-10-CM | POA: Diagnosis not present

## 2017-09-25 DIAGNOSIS — J439 Emphysema, unspecified: Secondary | ICD-10-CM | POA: Diagnosis not present

## 2017-09-25 DIAGNOSIS — H548 Legal blindness, as defined in USA: Secondary | ICD-10-CM | POA: Diagnosis not present

## 2017-09-25 DIAGNOSIS — M19012 Primary osteoarthritis, left shoulder: Secondary | ICD-10-CM | POA: Diagnosis not present

## 2017-09-29 ENCOUNTER — Ambulatory Visit (HOSPITAL_COMMUNITY)
Admission: RE | Admit: 2017-09-29 | Discharge: 2017-09-29 | Disposition: A | Discharge status: Home / Self Care | Payer: Medicare Other | Source: Ambulatory Visit | Attending: Acute Care | Admitting: Acute Care

## 2017-09-29 DIAGNOSIS — R918 Other nonspecific abnormal finding of lung field: Secondary | ICD-10-CM | POA: Insufficient documentation

## 2017-09-29 DIAGNOSIS — R590 Localized enlarged lymph nodes: Secondary | ICD-10-CM | POA: Diagnosis not present

## 2017-09-29 DIAGNOSIS — R911 Solitary pulmonary nodule: Secondary | ICD-10-CM | POA: Diagnosis not present

## 2017-09-29 LAB — GLUCOSE, CAPILLARY: GLUCOSE-CAPILLARY: 115 mg/dL — AB (ref 65–99)

## 2017-09-29 MED ORDER — FLUDEOXYGLUCOSE F - 18 (FDG) INJECTION
6.6800 | Freq: Once | INTRAVENOUS | Status: AC | PRN
  Administered 2017-09-29: 6.68 via INTRAVENOUS

## 2017-09-30 ENCOUNTER — Telehealth: Payer: Self-pay

## 2017-09-30 DIAGNOSIS — I739 Peripheral vascular disease, unspecified: Secondary | ICD-10-CM | POA: Diagnosis not present

## 2017-09-30 DIAGNOSIS — I129 Hypertensive chronic kidney disease with stage 1 through stage 4 chronic kidney disease, or unspecified chronic kidney disease: Secondary | ICD-10-CM | POA: Diagnosis not present

## 2017-09-30 DIAGNOSIS — N183 Chronic kidney disease, stage 3 (moderate): Secondary | ICD-10-CM | POA: Diagnosis not present

## 2017-09-30 DIAGNOSIS — I700 Atherosclerosis of aorta: Secondary | ICD-10-CM | POA: Diagnosis not present

## 2017-09-30 DIAGNOSIS — Z87891 Personal history of nicotine dependence: Secondary | ICD-10-CM | POA: Diagnosis not present

## 2017-09-30 DIAGNOSIS — I2510 Atherosclerotic heart disease of native coronary artery without angina pectoris: Secondary | ICD-10-CM | POA: Diagnosis not present

## 2017-09-30 DIAGNOSIS — Z86718 Personal history of other venous thrombosis and embolism: Secondary | ICD-10-CM | POA: Diagnosis not present

## 2017-09-30 DIAGNOSIS — E785 Hyperlipidemia, unspecified: Secondary | ICD-10-CM | POA: Diagnosis not present

## 2017-09-30 DIAGNOSIS — H548 Legal blindness, as defined in USA: Secondary | ICD-10-CM | POA: Diagnosis not present

## 2017-09-30 DIAGNOSIS — R918 Other nonspecific abnormal finding of lung field: Secondary | ICD-10-CM | POA: Diagnosis not present

## 2017-09-30 DIAGNOSIS — H9190 Unspecified hearing loss, unspecified ear: Secondary | ICD-10-CM | POA: Diagnosis not present

## 2017-09-30 DIAGNOSIS — J439 Emphysema, unspecified: Secondary | ICD-10-CM | POA: Diagnosis not present

## 2017-09-30 DIAGNOSIS — D631 Anemia in chronic kidney disease: Secondary | ICD-10-CM | POA: Diagnosis not present

## 2017-09-30 DIAGNOSIS — J8410 Pulmonary fibrosis, unspecified: Secondary | ICD-10-CM | POA: Diagnosis not present

## 2017-09-30 DIAGNOSIS — Z7982 Long term (current) use of aspirin: Secondary | ICD-10-CM | POA: Diagnosis not present

## 2017-09-30 DIAGNOSIS — Z7902 Long term (current) use of antithrombotics/antiplatelets: Secondary | ICD-10-CM | POA: Diagnosis not present

## 2017-09-30 DIAGNOSIS — M19012 Primary osteoarthritis, left shoulder: Secondary | ICD-10-CM | POA: Diagnosis not present

## 2017-09-30 NOTE — Telephone Encounter (Signed)
[name] with [facility] requesting VO for nursing and evaluation. Please call pt back.

## 2017-09-30 NOTE — Telephone Encounter (Signed)
I agree

## 2017-09-30 NOTE — Telephone Encounter (Signed)
[facility] calls and ask VO for start of care again, pt was not available when [facility] attempted start of care, she has made contact and will do the start of care visit soon, do you agree?

## 2017-10-02 ENCOUNTER — Telehealth: Payer: Self-pay | Admitting: *Deleted

## 2017-10-02 DIAGNOSIS — I129 Hypertensive chronic kidney disease with stage 1 through stage 4 chronic kidney disease, or unspecified chronic kidney disease: Secondary | ICD-10-CM | POA: Diagnosis not present

## 2017-10-02 DIAGNOSIS — M19012 Primary osteoarthritis, left shoulder: Secondary | ICD-10-CM | POA: Diagnosis not present

## 2017-10-02 DIAGNOSIS — Z87891 Personal history of nicotine dependence: Secondary | ICD-10-CM | POA: Diagnosis not present

## 2017-10-02 DIAGNOSIS — I739 Peripheral vascular disease, unspecified: Secondary | ICD-10-CM | POA: Diagnosis not present

## 2017-10-02 DIAGNOSIS — J8410 Pulmonary fibrosis, unspecified: Secondary | ICD-10-CM | POA: Diagnosis not present

## 2017-10-02 DIAGNOSIS — Z7982 Long term (current) use of aspirin: Secondary | ICD-10-CM | POA: Diagnosis not present

## 2017-10-02 DIAGNOSIS — J439 Emphysema, unspecified: Secondary | ICD-10-CM | POA: Diagnosis not present

## 2017-10-02 DIAGNOSIS — I2510 Atherosclerotic heart disease of native coronary artery without angina pectoris: Secondary | ICD-10-CM | POA: Diagnosis not present

## 2017-10-02 DIAGNOSIS — H9190 Unspecified hearing loss, unspecified ear: Secondary | ICD-10-CM | POA: Diagnosis not present

## 2017-10-02 DIAGNOSIS — Z7902 Long term (current) use of antithrombotics/antiplatelets: Secondary | ICD-10-CM | POA: Diagnosis not present

## 2017-10-02 DIAGNOSIS — D631 Anemia in chronic kidney disease: Secondary | ICD-10-CM | POA: Diagnosis not present

## 2017-10-02 DIAGNOSIS — E785 Hyperlipidemia, unspecified: Secondary | ICD-10-CM | POA: Diagnosis not present

## 2017-10-02 DIAGNOSIS — N183 Chronic kidney disease, stage 3 (moderate): Secondary | ICD-10-CM | POA: Diagnosis not present

## 2017-10-02 DIAGNOSIS — Z86718 Personal history of other venous thrombosis and embolism: Secondary | ICD-10-CM | POA: Diagnosis not present

## 2017-10-02 DIAGNOSIS — R918 Other nonspecific abnormal finding of lung field: Secondary | ICD-10-CM | POA: Diagnosis not present

## 2017-10-02 DIAGNOSIS — H548 Legal blindness, as defined in USA: Secondary | ICD-10-CM | POA: Diagnosis not present

## 2017-10-02 DIAGNOSIS — I700 Atherosclerosis of aorta: Secondary | ICD-10-CM | POA: Diagnosis not present

## 2017-10-02 NOTE — Telephone Encounter (Signed)
Thank you :)

## 2017-10-02 NOTE — Telephone Encounter (Signed)
Patient instructed to stop HCTZ till she is seen in [facility] next week. Also requested she bring all meds with her to appt. Discussed transitioning slowly from lying/reclining to sitting before standing to prevent lightheadedness.  Patient usually only takes dulcolax. Again discussed taking miralax for constipation instead.   Patient wanted to know result of recent testing. PET scan ordered by Dr. Elie Confer at [facility]. Instructed patient to call that office for result. Hubbard Hartshorn, RN, BSN

## 2017-10-02 NOTE — Telephone Encounter (Signed)
[name], PT with [facility] called to report patient cannot remember when she had last BM but thinks it was last week. Patient drinking fluids and walking 400 ft at a time as well as doing lower body exercises. Has not been taking miralax but will restart today.  [name] also reporting borderline low BPs: 102/58, 98/58, 104/58. Patient does report dizziness. Was d/c from hosp 09/15/2017 on 12.5 mg HCTZ. BPs during hosp stay were roughly the same. Patient has appt to see PCP 10/14/2017. Scheduled patient for HFU 10/06/2017. Hubbard Hartshorn, RN, BSN

## 2017-10-02 NOTE — Telephone Encounter (Signed)
I agree with her taking the Miralax. Would have her keep her follow up next week and hold her HCTZ till then.

## 2017-10-06 ENCOUNTER — Ambulatory Visit (INDEPENDENT_AMBULATORY_CARE_PROVIDER_SITE_OTHER): Payer: Medicare Other | Admitting: Internal Medicine

## 2017-10-06 ENCOUNTER — Encounter: Payer: Self-pay | Admitting: Internal Medicine

## 2017-10-06 ENCOUNTER — Telehealth: Payer: Self-pay | Admitting: Acute Care

## 2017-10-06 ENCOUNTER — Other Ambulatory Visit: Payer: Self-pay | Admitting: Acute Care

## 2017-10-06 VITALS — BP 111/61 | HR 95 | Temp 98.0°F | Wt 128.2 lb

## 2017-10-06 DIAGNOSIS — H548 Legal blindness, as defined in USA: Secondary | ICD-10-CM | POA: Diagnosis not present

## 2017-10-06 DIAGNOSIS — J181 Lobar pneumonia, unspecified organism: Secondary | ICD-10-CM | POA: Diagnosis not present

## 2017-10-06 DIAGNOSIS — Z79899 Other long term (current) drug therapy: Secondary | ICD-10-CM

## 2017-10-06 DIAGNOSIS — I10 Essential (primary) hypertension: Secondary | ICD-10-CM

## 2017-10-06 DIAGNOSIS — Z87891 Personal history of nicotine dependence: Secondary | ICD-10-CM | POA: Diagnosis not present

## 2017-10-06 DIAGNOSIS — F17210 Nicotine dependence, cigarettes, uncomplicated: Secondary | ICD-10-CM | POA: Diagnosis not present

## 2017-10-06 DIAGNOSIS — M25531 Pain in right wrist: Secondary | ICD-10-CM | POA: Diagnosis not present

## 2017-10-06 DIAGNOSIS — J8410 Pulmonary fibrosis, unspecified: Secondary | ICD-10-CM | POA: Diagnosis not present

## 2017-10-06 DIAGNOSIS — Z7902 Long term (current) use of antithrombotics/antiplatelets: Secondary | ICD-10-CM | POA: Diagnosis not present

## 2017-10-06 DIAGNOSIS — K5900 Constipation, unspecified: Secondary | ICD-10-CM | POA: Diagnosis not present

## 2017-10-06 DIAGNOSIS — J439 Emphysema, unspecified: Secondary | ICD-10-CM | POA: Diagnosis not present

## 2017-10-06 DIAGNOSIS — I739 Peripheral vascular disease, unspecified: Secondary | ICD-10-CM | POA: Diagnosis not present

## 2017-10-06 DIAGNOSIS — I700 Atherosclerosis of aorta: Secondary | ICD-10-CM | POA: Diagnosis not present

## 2017-10-06 DIAGNOSIS — M19012 Primary osteoarthritis, left shoulder: Secondary | ICD-10-CM | POA: Diagnosis not present

## 2017-10-06 DIAGNOSIS — R05 Cough: Secondary | ICD-10-CM | POA: Diagnosis not present

## 2017-10-06 DIAGNOSIS — R918 Other nonspecific abnormal finding of lung field: Secondary | ICD-10-CM

## 2017-10-06 DIAGNOSIS — Z7982 Long term (current) use of aspirin: Secondary | ICD-10-CM | POA: Diagnosis not present

## 2017-10-06 DIAGNOSIS — J449 Chronic obstructive pulmonary disease, unspecified: Secondary | ICD-10-CM | POA: Diagnosis not present

## 2017-10-06 DIAGNOSIS — H9190 Unspecified hearing loss, unspecified ear: Secondary | ICD-10-CM | POA: Diagnosis not present

## 2017-10-06 DIAGNOSIS — I129 Hypertensive chronic kidney disease with stage 1 through stage 4 chronic kidney disease, or unspecified chronic kidney disease: Secondary | ICD-10-CM | POA: Diagnosis not present

## 2017-10-06 DIAGNOSIS — I2510 Atherosclerotic heart disease of native coronary artery without angina pectoris: Secondary | ICD-10-CM | POA: Diagnosis not present

## 2017-10-06 DIAGNOSIS — Z86718 Personal history of other venous thrombosis and embolism: Secondary | ICD-10-CM | POA: Diagnosis not present

## 2017-10-06 DIAGNOSIS — D631 Anemia in chronic kidney disease: Secondary | ICD-10-CM | POA: Diagnosis not present

## 2017-10-06 DIAGNOSIS — N183 Chronic kidney disease, stage 3 (moderate): Secondary | ICD-10-CM | POA: Diagnosis not present

## 2017-10-06 DIAGNOSIS — E785 Hyperlipidemia, unspecified: Secondary | ICD-10-CM | POA: Diagnosis not present

## 2017-10-06 MED ORDER — AEROCHAMBER MV MISC: 0 refills | Status: DC

## 2017-10-06 MED ORDER — DOXYCYCLINE HYCLATE 100 MG PO TABS: 100.0000 mg | ORAL_TABLET | Freq: Two times a day (BID) | ORAL | 0 refills | Status: DC

## 2017-10-06 MED ORDER — POLYETHYLENE GLYCOL 3350 17 GM/SCOOP PO POWD: 17.0000 g | Freq: Every day | ORAL | 0 refills | Status: DC

## 2017-10-06 MED ORDER — BUDESONIDE-FORMOTEROL FUMARATE 160-4.5 MCG/ACT IN AERO: 2.0000 | INHALATION_SPRAY | Freq: Two times a day (BID) | RESPIRATORY_TRACT | 0 refills | Status: DC

## 2017-10-06 NOTE — Telephone Encounter (Signed)
Gathered items for nurse to come pick up. Nothing further is needed.

## 2017-10-06 NOTE — Assessment & Plan Note (Signed)
Patient was given prescription for miralax due to mild constipation.  Last bm Sunday but still feels constipated.  Son has not been able to take her to the pharmacy to get prescription.  No hypercalcemia noted on recent bmp.  -will send new prescription electronically to [facility] as they can deliver her miralax for her.

## 2017-10-06 NOTE — Telephone Encounter (Signed)
Spoke with Amy, she states the patient is coughing so much and she has coughed up a big green blob of mucus and pt describes it as "feels like worms in her mouth" and Amy is concerned about the what the next step is. Pt had a PET scan but SG has not reviewed it. Amy wants to see if they are going to do a biopsy and if maybe she needed to be seen sooner for the cough. SG please advise.   Please call Amy back.     Instructions      Return in about 2 weeks (around 10/07/2017), or if symptoms worsen or fail to improve.  It is nice to meet you today. We will schedule you for a PET scan. This will help [place] determine the best way to biopsy the lung mass.  We will schedule you for  PFT's. Use your rescue inhaler as needed for shortness of breath or wheezing. Use this 2 puffs as needed up to 4 times a day for shortness of breath or wheezing. Follow up with Dr. Lamonte Sakai or Judson Roch NP in 2 weeks, PFT's and PET need to be done before. Please contact office for sooner follow up if symptoms do not improve or worsen or seek emergency care

## 2017-10-06 NOTE — Patient Instructions (Addendum)
We will resend the prescription for your miralax to [facility] so they can deliver this to you.  We have stopped your blood pressure medicine for the time being due to lower blood pressures.  Please contact your pulmonologist about information specific to when your biopsy will be.  We will send them a message as well here is there number:  [phone]

## 2017-10-06 NOTE — Assessment & Plan Note (Addendum)
PT had PET scan ordered by pulmonology at her outpatient visit.  Patient is upset that she has not yet heard the results.  We discussed the results of her study, unfortunately the patient is having difficulty accepting that she almost certainly has cancer.  She sites her religious beliefs and that it cannot be cancer because she prayed about it and was able to quit smoking.  She is anxious to get a biopsy done.    She denies infectious symptoms since her discharge.  She does still endorse occasionally productive cough.  She has not been having more pain but does continue to feel as if she's losing weight.    -gave pt number to her pulmonologist encouraged her to call to address her concerns and wanting to expedite the biopsy -will epic message sarah groce about pts concerns as well -asked to get cbc due to persistent leukocytosis will obtain today

## 2017-10-06 NOTE — Progress Notes (Signed)
CC: follow up of RUL mass, HTN, constipation,   HPI:  [name] is a 77 y.o. female with PMH below.  She is here for a hospital follow up where she was admitted for lung consolidation and newly found mass.   Please see A&P for status of the patient's chronic medical conditions  Past Medical History:  Diagnosis Date  . Arthritis    "left shoulder" (04/07/2016)  . Blind    s/p bilateral corneal transplant; "legally blind in both eyes" (04/07/2016)  . CKD (chronic kidney disease), stage III ([place])   . COPD (chronic obstructive pulmonary disease) ([place])   . DVT (deep venous thrombosis) ([place]) "years ago"   "right thigh"    . HEARING LOSS   . Hyperlipidemia   . Hypertension   . PAD (peripheral artery disease) ([place])    a. 03/2016 Periph Angio: L SFA 99p, 96m w/ one vessel runoff. R SFA 127m w/ one vessel runofff;  b. 04/07/2016 PTA L SFA ([place] 1 LS atherecromty w/ DEBA). c. 04/24/16 s/p PTA to R SFA, right tibioperoneal trunk and posterior tibial artery.  . Phlebitis   . Tobacco abuse    Review of Systems:  ROS: Pulmonary: pt denies increased work of breathing, shortness of breath,  Cardiac: pt denies palpitations, chest pain,  Abdominal: pt denies abdominal pain, nausea, vomiting, or diarrhea  Physical Exam:  Vitals:   10/06/17 0829  BP: 111/61  Pulse: 95  Temp: 98 F (36.7 C)  TempSrc: Oral  SpO2: 98%  Weight: 128 lb 3.2 oz (58.2 kg)   Physical Exam  Constitutional: No distress.  Cardiovascular: Normal rate, regular rhythm and normal heart sounds. Exam reveals no gallop and no friction rub.  No murmur heard. Loud P2  Pulmonary/Chest: Effort normal and breath sounds normal. No respiratory distress. She has no wheezes. She has no rales. She exhibits no tenderness.  Abdominal: Soft. Bowel sounds are normal. She exhibits no distension and no mass. There is no tenderness. There is no rebound and no guarding.  Musculoskeletal: She exhibits no edema or deformity.    Neurological: She is alert.  Skin: She is not diaphoretic.    Social History   Socioeconomic History  . Marital status: Divorced    Spouse name: Not on file  . Number of children: Not on file  . Years of education: Not on file  . Highest education level: Not on file  Occupational History  . Not on file  Social Needs  . Financial resource strain: Not on file  . Food insecurity:    Worry: Not on file    Inability: Not on file  . Transportation needs:    Medical: Not on file    Non-medical: Not on file  Tobacco Use  . Smoking status: Former Smoker    Packs/day: 0.50    Years: 62.00    Pack years: 31.00    Types: Cigarettes    Last attempt to quit: 08/12/2017    Years since quitting: 0.1  . Smokeless tobacco: Never Used  . Tobacco comment: Sometimes less-6 7 CIGARETTES A DAY; uses patches sometimes.  Substance and Sexual Activity  . Alcohol use: No    Alcohol/week: 0.0 oz  . Drug use: No  . Sexual activity: Never    Birth control/protection: Post-menopausal  Lifestyle  . Physical activity:    Days per week: Not on file    Minutes per session: Not on file  . Stress: Not on file  Relationships  .  Social connections:    Talks on phone: Not on file    Gets together: Not on file    Attends religious service: Not on file    Active member of club or organization: Not on file    Attends meetings of clubs or organizations: Not on file    Relationship status: Not on file  . Intimate partner violence:    Fear of current or ex partner: Not on file    Emotionally abused: Not on file    Physically abused: Not on file    Forced sexual activity: Not on file  Other Topics Concern  . Not on file  Social History Narrative   Patient currently lives at home alone, but does have a home worker who visits her throughout the week to help with things around the house.  Patient states that she still tries to remain active and walks daily.  She is has been divorced for sometime and has one  son.  She currently lives off social security.  Patient reports using approximately half a pack of cigarettes daily for 20 years.  She denies any alcohol or illicit drug use.  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      Family History  Problem Relation Age of Onset  . Cancer Maternal Grandmother        liver  . Cancer Maternal Uncle        colon  . Cancer Mother        liver  . Cancer Brother        Unknown  . Cancer Sister        Unknown    Assessment & Plan:   See Encounters Tab for problem based charting.  Patient discussed with Dr. Lynnae January

## 2017-10-06 NOTE — Assessment & Plan Note (Signed)
BP Readings from Last 3 Encounters:  10/06/17 111/61  09/23/17 102/60  09/15/17 (!) 100/55   BP remains low but has improved after d/c of HCTZ.  Has not reported dizziness or symptoms since D/c. I expect that since her weight loss 2/2 active lung cancer she will no longer require antihypertensive therapy.    -collected pts HCTZ from her today d/ced in orders and will continue to follow.

## 2017-10-06 NOTE — Telephone Encounter (Signed)
I spoke with patient's Home Health RN. ( Amy Angraze) I explained that Dr. Lamonte Sakai is reviewing PET, and we will call her when we have decided the best option for tissue sampling. Amy states patient has started coughing up thick green secretions. No fever, and cough that is keeping her up at night. Plan: Please call in Doxycycline 100 mg BID x 7 days ( [place].)Take with full glass of water. Please schedule Acute appointment Friday at 3:30>> ok to use Shared decision slot. ( Pt. Is blind and needs 24 + hours to schedule transportation) Home RN Amy is going to pick up Symbicort 100 from the office. ( Therapeutic trial from sample cabinet) 2 puffs twice daily Rinse mouth after use Please give Delsym samples ( 5 cc's every 12 hours for cough) Discussed with Home Health RN to make sure patient is using Mucinex, 1200 mg BID, and drinking full glass of water with Mucinex. ( Plain Mucinex) Please send Mucinex samples with Home Health RN   Pt. Will be seen Friday, we will do FENO and see if we need to add prednisone. Thanks so much

## 2017-10-07 ENCOUNTER — Telehealth: Payer: Self-pay | Admitting: Acute Care

## 2017-10-07 DIAGNOSIS — Z86718 Personal history of other venous thrombosis and embolism: Secondary | ICD-10-CM | POA: Diagnosis not present

## 2017-10-07 DIAGNOSIS — Z7982 Long term (current) use of aspirin: Secondary | ICD-10-CM | POA: Diagnosis not present

## 2017-10-07 DIAGNOSIS — I700 Atherosclerosis of aorta: Secondary | ICD-10-CM | POA: Diagnosis not present

## 2017-10-07 DIAGNOSIS — I739 Peripheral vascular disease, unspecified: Secondary | ICD-10-CM | POA: Diagnosis not present

## 2017-10-07 DIAGNOSIS — E785 Hyperlipidemia, unspecified: Secondary | ICD-10-CM | POA: Diagnosis not present

## 2017-10-07 DIAGNOSIS — Z87891 Personal history of nicotine dependence: Secondary | ICD-10-CM | POA: Diagnosis not present

## 2017-10-07 DIAGNOSIS — I2510 Atherosclerotic heart disease of native coronary artery without angina pectoris: Secondary | ICD-10-CM | POA: Diagnosis not present

## 2017-10-07 DIAGNOSIS — H9190 Unspecified hearing loss, unspecified ear: Secondary | ICD-10-CM | POA: Diagnosis not present

## 2017-10-07 DIAGNOSIS — J439 Emphysema, unspecified: Secondary | ICD-10-CM | POA: Diagnosis not present

## 2017-10-07 DIAGNOSIS — M19012 Primary osteoarthritis, left shoulder: Secondary | ICD-10-CM | POA: Diagnosis not present

## 2017-10-07 DIAGNOSIS — I129 Hypertensive chronic kidney disease with stage 1 through stage 4 chronic kidney disease, or unspecified chronic kidney disease: Secondary | ICD-10-CM | POA: Diagnosis not present

## 2017-10-07 DIAGNOSIS — N183 Chronic kidney disease, stage 3 (moderate): Secondary | ICD-10-CM | POA: Diagnosis not present

## 2017-10-07 DIAGNOSIS — Z7902 Long term (current) use of antithrombotics/antiplatelets: Secondary | ICD-10-CM | POA: Diagnosis not present

## 2017-10-07 DIAGNOSIS — D631 Anemia in chronic kidney disease: Secondary | ICD-10-CM | POA: Diagnosis not present

## 2017-10-07 DIAGNOSIS — J8410 Pulmonary fibrosis, unspecified: Secondary | ICD-10-CM | POA: Diagnosis not present

## 2017-10-07 DIAGNOSIS — R918 Other nonspecific abnormal finding of lung field: Secondary | ICD-10-CM | POA: Diagnosis not present

## 2017-10-07 DIAGNOSIS — H548 Legal blindness, as defined in USA: Secondary | ICD-10-CM | POA: Diagnosis not present

## 2017-10-07 LAB — CBC WITH DIFFERENTIAL/PLATELET
BASOS ABS: 0 10*3/uL (ref 0.0–0.2)
Basos: 0 %
EOS (ABSOLUTE): 0 10*3/uL (ref 0.0–0.4)
Eos: 0 %
HEMATOCRIT: 27.6 % — AB (ref 34.0–46.6)
Hemoglobin: 8.6 g/dL — ABNORMAL LOW (ref 11.1–15.9)
IMMATURE GRANULOCYTES: 1 %
Immature Grans (Abs): 0.1 10*3/uL (ref 0.0–0.1)
LYMPHS ABS: 1.3 10*3/uL (ref 0.7–3.1)
Lymphs: 11 %
MCH: 28.2 pg (ref 26.6–33.0)
MCHC: 31.2 g/dL — AB (ref 31.5–35.7)
MCV: 91 fL (ref 79–97)
MONOS ABS: 1.2 10*3/uL — AB (ref 0.1–0.9)
Monocytes: 10 %
Neutrophils Absolute: 9.8 10*3/uL — ABNORMAL HIGH (ref 1.4–7.0)
Neutrophils: 78 %
Platelets: 448 10*3/uL (ref 150–450)
RBC: 3.05 x10E6/uL — ABNORMAL LOW (ref 3.77–5.28)
RDW: 15.1 % (ref 12.3–15.4)
WBC: 12.4 10*3/uL — ABNORMAL HIGH (ref 3.4–10.8)

## 2017-10-07 NOTE — Telephone Encounter (Signed)
Called and spoke to [facility] with [facility] and reviewed with her what had been reviewed with Amy at [facility]. [name] was in the home with the patient for therapy and wanted to make sure that patient understood what appointments she has scheduled and the times. [name] also stated she was going to make sure SCAT was arranged for the correct times. Nothing further is needed at this time.

## 2017-10-07 NOTE — Telephone Encounter (Signed)
Talked with SG and was told patient should do PFT at 4pm slot. Scheduled patient for that PFT appointment following her OV. Called and spoke to patient who seemed confused. Called and talked with Amy at [facility]. She has arranged the [facility] transportation for appointment and patient will be picked up around 5:15 following PFT. Since patient is so hard of hearing Amy is going to go over and review the plan with the patient. Nothing further is needed at this time.

## 2017-10-07 NOTE — Progress Notes (Signed)
Internal Medicine Clinic Attending  Case discussed with Dr. Winfrey  at the time of the visit.  We reviewed the resident's history and exam and pertinent patient test results.  I agree with the assessment, diagnosis, and plan of care documented in the resident's note.  

## 2017-10-07 NOTE — Telephone Encounter (Signed)
Pt. Is scheduled to be seen Friday at 3:30. Please call her and see if we can get PFT's done at that visit. We need PFT's to determine the best way to biopsy. She will be to be here early to do this.( See if we can schedule her at 2 pm)  She will need to be reminded to not use her inhalers that morning.You may want to call her home health nurse assist with arranging as patient is blind and has to use SCAT, which required advanced scheduling. Thanks so much.

## 2017-10-07 NOTE — Telephone Encounter (Signed)
Sarah we don't have a 2pm opening for Friday (only 4pm) for PFTs.    SG please advise.

## 2017-10-08 ENCOUNTER — Telehealth: Payer: Self-pay | Admitting: Internal Medicine

## 2017-10-08 ENCOUNTER — Other Ambulatory Visit: Payer: Self-pay | Admitting: Internal Medicine

## 2017-10-08 NOTE — Telephone Encounter (Signed)
Thank you :)

## 2017-10-08 NOTE — Telephone Encounter (Signed)
Amy from [facility]; 1x a week thru July 08 Pulmonary/ PFT  tomorrow

## 2017-10-08 NOTE — Telephone Encounter (Signed)
Spoke with Amy, RN. States patient has been coughing up thick green sputum. Amy called [facility] and patient now on doxycycline, mucinex, delsym, and symbicort with spacer. Amy arranged transpo for patient tomorrow to [employer] for PFTs. Amy will continue to see patient weekly till certification ends on July 8th and will reevaluate at that time. Hubbard Hartshorn, RN, BSN

## 2017-10-09 ENCOUNTER — Telehealth: Payer: Self-pay | Admitting: Acute Care

## 2017-10-09 ENCOUNTER — Ambulatory Visit: Payer: Medicare Other | Admitting: Acute Care

## 2017-10-09 ENCOUNTER — Ambulatory Visit (INDEPENDENT_AMBULATORY_CARE_PROVIDER_SITE_OTHER): Payer: Medicare Other | Admitting: Emergency Medicine

## 2017-10-09 DIAGNOSIS — R918 Other nonspecific abnormal finding of lung field: Secondary | ICD-10-CM | POA: Diagnosis not present

## 2017-10-09 LAB — PULMONARY FUNCTION TEST
FEF 25-75 Pre: 1 L/sec
FEF2575-%Pred-Pre: 87 %
FEV1-%Pred-Pre: 90 %
FEV1-Pre: 1.17 L
FEV1FVC-%PRED-PRE: 97 %
FEV6-%PRED-PRE: 95 %
FEV6-PRE: 1.52 L
FEV6FVC-%PRED-PRE: 104 %
FVC-%PRED-PRE: 93 %
FVC-PRE: 1.58 L
PRE FEV1/FVC RATIO: 74 %
PRE FEV6/FVC RATIO: 100 %

## 2017-10-09 NOTE — Telephone Encounter (Signed)
Called and spoke to Amy with [facility] and scheduled visit with SG on 10/14/17 at 1425 to reviewed PFT and discuss lung biopsy plan. Amy stated she will call and get SCAT scheduled.   Patient did not arrive today for 1530 appointment until 1615 so she was unable to meet with SG. SC[facility] bus had a flat tire preventing her from getting here on time.   Nothing further is needed at this time.

## 2017-10-09 NOTE — Progress Notes (Signed)
Patient was only able to do pre Arlyce Harman on PFT. Pt couldn't complete DLCO tried several times. Patient had to stop due to productive cough-green thick much and not feeling well. Patient wanted to stop the test.

## 2017-10-09 NOTE — Progress Notes (Deleted)
History of Present Illness [name] is a 77 y.o. female  former smoker with new mass like consolidation on CT Chest. She was seen as an inpatient in the hospital.     10/09/2017  Pt. Presents as follow up to discuss  biopsy options after PET scan indicated hypermetabolic activity per  RUL consolidation and right hilar and mediastinal lymph nodes. I had discussed the results by phone with the patient and she had her home health RN call me on 10/06/2017 for clarification. She is blind and HOH. Additionally she was complaining of  thick green secretions and non-productive cough that was keeping her up at night. We called in Doxycycline 100 mg BID x 7 days, and also send her Symbicort  samples ( through her Home Health RN) to try for her COPD with a spacer. ( Therapeutic trial) Dr. Lamonte Sakai reviewed her PET scan results and feels the best option for tissue sampling is EBUS/ Bronch, but only if she can be safely put to sleep.She will have PFT's today to determine safety of general anesthesia . She presents today for follow up and PFT's . She states she has been compliant with use of her Symbicort and spacer, and her doxycycline.    Test Results: PET scan 09/29/2017 Mass-like right upper lobe consolidation demonstrates marked hypermetabolic activity and is associated with hypermetabolic right hilar and mediastinal lymph nodes, again suspicious for thoracic malignancy. Tissue sampling recommended. 2. Indeterminate small hypermetabolic level IIB left cervical lymph node. This may be unrelated. 3. Indeterminate additional small, hypermetabolic pulmonary nodules bilaterally. 4. No evidence of metastatic disease in the abdomen or pelvis.    CT chest:IMPRESSION:09/13/2017 1. Masslike consolidation in the apical and posterior segments of the right upper lobe, worrisome for malignancy when compared with chest radiographs dating back to 01/11/2017. Pneumonia is not excluded. 2. Mediastinal and  suspected right hilar adenopathy. 3. Subpleural pulmonary fibrosis, poorly evaluated due to respiratory motion and lack of high-resolution imaging. 4. Aortic atherosclerosis (ICD10-170.0). Extensive 3 vessel coronary artery calcification. 5. Emphysema (ICD10-J43.9).  CBC Latest Ref Rng & Units 10/06/2017 09/15/2017 09/14/2017  WBC 3.4 - 10.8 x10E3/uL 12.4(H) 16.1(H) 14.2(H)  Hemoglobin 11.1 - 15.9 g/dL 8.6(L) 8.6(L) 8.5(L)  Hematocrit 34.0 - 46.6 % 27.6(L) 27.3(L) 26.9(L)  Platelets 150 - 450 x10E3/uL 448 321 290    BMP Latest Ref Rng & Units 09/15/2017 09/14/2017 09/13/2017  Glucose 65 - 99 mg/dL 130(H) 147(H) 113(H)  BUN 6 - 20 mg/dL 21(H) 27(H) 35(H)  Creatinine 0.44 - 1.00 mg/dL 1.83(H) 1.99(H) 2.37(H)  BUN/Creat Ratio 12 - 28 - - -  Sodium 135 - 145 mmol/L 139 137 135  Potassium 3.5 - 5.1 mmol/L 4.0 3.7 4.2  Chloride 101 - 111 mmol/L 106 103 100(L)  CO2 22 - 32 mmol/L 24 25 23   Calcium 8.9 - 10.3 mg/dL 9.0 8.5(L) 8.8(L)    BNP    Component Value Date/Time   BNP 10.6 02/09/2015 1942    ProBNP    Component Value Date/Time   PROBNP 31.7 02/20/2014 1918    PFT No results found for: FEV1PRE, FEV1POST, FVCPRE, FVCPOST, TLC, DLCOUNC, PREFEV1FVCRT, PSTFEV1FVCRT  Dg Chest 2 View  Result Date: 09/13/2017 CLINICAL DATA:  Diagnosed with urinary tract infection and pneumonia 2 days ago. Dizziness after starting antibiotics. EXAM: CHEST - 2 VIEW COMPARISON:  Radiographs 09/11/2017 and 01/11/2017. Older studies are also correlated. FINDINGS: The heart size and mediastinal contours are stable. There is aortic atherosclerosis. There is volume loss in the right  upper lobe with increasing right apical density. This is new from remote prior studies and likely inflammatory. Patchy basilar opacities bilaterally appears stable. There is no pleural effusion or pneumothorax. No acute osseous findings are seen. IMPRESSION: Progressive right apical airspace disease, most likely reflecting pneumonia.  Reactivation tuberculosis should be excluded. Radiographic follow-up after appropriate therapy recommended to exclude neoplasm. Electronically Signed   By: Richardean Sale M.D.   On: 09/13/2017 14:45   Dg Chest 2 View  Result Date: 09/11/2017 CLINICAL DATA:  77 year old female with abdominal pain. EXAM: CHEST - 2 VIEW; ABDOMEN - 2 VIEW COMPARISON:  CT of the abdomen pelvis dated 01/11/2017 and chest radiograph dated 03/06/2016 FINDINGS: Diffuse interstitial coarsening most consistent with underlying pulmonary fibrosis. Clinical correlation is recommended. Focal area of increased density in the right apical region appears new compared to prior radiograph and may represent superimposed pneumonia. Clinical correlation and follow-up recommended. There is no pleural effusion or pneumothorax. The cardiac silhouette is within normal limits. There is atherosclerotic calcification of the aortic arch. There is no bowel dilatation or evidence of obstruction. No free air or radiopaque calculi. Right upper quadrant cholecystectomy clips. There is osteopenia with degenerative changes of the spine. No acute osseous pathology. IMPRESSION: 1. Chronic interstitial prominence likely related to underlying pulmonary fibrosis. Right apical airspace density appears new compared to the prior radiograph and may represent superimposed pneumonia. Clinical correlation and follow-up recommended. 2. No evidence of bowel obstruction. Electronically Signed   By: Anner Crete M.D.   On: 09/11/2017 06:43   Ct Chest Wo Contrast  Result Date: 09/13/2017 CLINICAL DATA:  Abnormal chest radiograph, persistent cough. EXAM: CT CHEST WITHOUT CONTRAST TECHNIQUE: Multidetector CT imaging of the chest was performed following the standard protocol without IV contrast. COMPARISON:  Chest radiograph 09/13/2017 and 01/11/2017. FINDINGS: Cardiovascular: Atherosclerotic calcification of the arterial vasculature, including extensive three-vessel involvement  of the coronary arteries. Heart size normal. No pericardial effusion. Mediastinum/Nodes: Mediastinal adenopathy measures up to 1.6 cm in the low right paratracheal station. Right hilar adenopathy is suspected but evaluation is limited without IV contrast. No axillary adenopathy. Esophagus is grossly unremarkable. Lungs/Pleura: Image quality is degraded by respiratory motion. There is masslike consolidation in the apical and posterior segments of the right upper lobe. Probable mild paraseptal emphysema. Subpleural reticulation and ground-glass are seen with a probable mild basilar predominance. Evaluation is limited due to respiratory motion. No pleural fluid. Airway is unremarkable. Upper Abdomen: Visualized portions of the liver, adrenal glands and right kidney are grossly unremarkable. Low-attenuation lesion in the left kidney measures 5.7 cm and is incompletely imaged. Visualized portions of the spleen, pancreas, stomach and bowel are grossly unremarkable. Cholecystectomy. Musculoskeletal: Degenerative changes in the spine. No worrisome lytic or sclerotic lesions. There may be mild scattered endplate compression deformities, age indeterminate. IMPRESSION: 1. Masslike consolidation in the apical and posterior segments of the right upper lobe, worrisome for malignancy when compared with chest radiographs dating back to 01/11/2017. Pneumonia is not excluded. 2. Mediastinal and suspected right hilar adenopathy. 3. Subpleural pulmonary fibrosis, poorly evaluated due to respiratory motion and lack of high-resolution imaging. 4. Aortic atherosclerosis (ICD10-170.0). Extensive 3 vessel coronary artery calcification. 5.  Emphysema (ICD10-J43.9). Electronically Signed   By: Lorin Picket M.D.   On: 09/13/2017 17:13   US Renal  Result Date: 09/14/2017 CLINICAL DATA:  Hematuria x1 day. Urinary tract infection. Chronic renal disease. EXAM: RENAL / URINARY TRACT ULTRASOUND COMPLETE COMPARISON:  CT from 01/11/2017  FINDINGS: Right Kidney: Length: 9.2 cm.  Echogenicity within normal limits. Anechoic simple cysts are noted of the right kidney measuring 1.5 x 1.2 x 1.2 cm in the upper pole and 0.7 cm in diameter in the lower pole. Left Kidney: Length: 9.6 cm. Echogenicity within normal limits. Anechoic exophytic cyst off the lateral left kidney measuring 5.8 x 4.4 x 5 cm is noted. No solid mass or hydronephrosis visualized. Bladder: Appears normal for degree of bladder distention. IMPRESSION: Bilateral renal cysts.  No obstructive uropathy or nephrolithiasis. Electronically Signed   By: Ashley Royalty M.D.   On: 09/14/2017 01:52   Nm Pet Image Initial (pi) Skull Base To Thigh  Result Date: 09/29/2017 CLINICAL DATA:  Initial treatment strategy for right upper lobe consolidation and mediastinal adenopathy on CT, suspicious for bronchogenic carcinoma. EXAM: NUCLEAR MEDICINE PET SKULL BASE TO THIGH TECHNIQUE: 6.68 mCi F-18 FDG was injected intravenously. Full-ring PET imaging was performed from the skull base to thigh after the radiotracer. CT data was obtained and used for attenuation correction and anatomic localization. Fasting blood glucose: 115 mg/dl COMPARISON:  Chest CT 09/13/2017.  Abdominopelvic CT 01/11/2017. FINDINGS: Mediastinal blood pool activity: SUV max 2.8 NECK: There is a small hypermetabolic left level IIB node, measuring 5 mm short axis on image 26 (SUV max 4.9). No other hypermetabolic cervical lymph nodes are seen.There are no lesions of the pharyngeal mucosal space. Incidental CT findings: 12 mm right thyroid nodule on image 48 demonstrates no abnormal metabolic activity. There is carotid atherosclerosis bilaterally. CHEST: The previously demonstrated mass-like consolidation within the right upper lobe demonstrates homogeneous marked hypermetabolic activity with an SUV max of 14.8. This measures up to 7.0 x 4.8 cm transverse on image 57/4. Small subpleural nodules in the right upper lobe are also mildly  hypermetabolic for size. These include a 5 x 12 mm nodule on image 19/8 (SUV max 3.0) and a 9 x 7 mm nodule on image 24/8 (SUV max 3.6). There is a small nodule in the lingula which is also hypermetabolic for size, measuring 4 x 6 mm on image 29/8 (SUV max 2.4). There is hypermetabolic right hilar and precarinal adenopathy. Precarinal node measures 2.2 x 2.9 cm on image 67 and has an SUV max of 12.5. Right hilar node has an SUV max 8.6. Incidental CT findings: Diffuse atherosclerosis of the aorta, great vessels and coronary arteries. Subpleural reticulation and additional scattered nodularity throughout both lungs. ABDOMEN/PELVIS: There is no hypermetabolic activity within the liver, adrenal glands, spleen or pancreas. There is no hypermetabolic nodal activity. Incidental CT findings: Bilateral renal cysts and diffuse aortic and branch vessel atherosclerosis noted. SKELETON: There is no hypermetabolic activity to suggest osseous metastatic disease. Incidental CT findings: Mild multilevel spondylosis. IMPRESSION: 1. Mass-like right upper lobe consolidation demonstrates marked hypermetabolic activity and is associated with hypermetabolic right hilar and mediastinal lymph nodes, again suspicious for thoracic malignancy. Tissue sampling recommended. 2. Indeterminate small hypermetabolic level IIB left cervical lymph node. This may be unrelated. 3. Indeterminate additional small, hypermetabolic pulmonary nodules bilaterally. 4. No evidence of metastatic disease in the abdomen or pelvis. Electronically Signed   By: Richardean Sale M.D.   On: 09/29/2017 16:47   Dg Abd 2 Views  Result Date: 09/11/2017 CLINICAL DATA:  77 year old female with abdominal pain. EXAM: CHEST - 2 VIEW; ABDOMEN - 2 VIEW COMPARISON:  CT of the abdomen pelvis dated 01/11/2017 and chest radiograph dated 03/06/2016 FINDINGS: Diffuse interstitial coarsening most consistent with underlying pulmonary fibrosis. Clinical correlation is recommended. Focal  area of increased density in  the right apical region appears new compared to prior radiograph and may represent superimposed pneumonia. Clinical correlation and follow-up recommended. There is no pleural effusion or pneumothorax. The cardiac silhouette is within normal limits. There is atherosclerotic calcification of the aortic arch. There is no bowel dilatation or evidence of obstruction. No free air or radiopaque calculi. Right upper quadrant cholecystectomy clips. There is osteopenia with degenerative changes of the spine. No acute osseous pathology. IMPRESSION: 1. Chronic interstitial prominence likely related to underlying pulmonary fibrosis. Right apical airspace density appears new compared to the prior radiograph and may represent superimposed pneumonia. Clinical correlation and follow-up recommended. 2. No evidence of bowel obstruction. Electronically Signed   By: Anner Crete M.D.   On: 09/11/2017 06:43     Past medical hx Past Medical History:  Diagnosis Date  . Arthritis    "left shoulder" (04/07/2016)  . Blind    s/p bilateral corneal transplant; "legally blind in both eyes" (04/07/2016)  . CKD (chronic kidney disease), stage III ([place])   . COPD (chronic obstructive pulmonary disease) ([place])   . DVT (deep venous thrombosis) ([place]) "years ago"   "right thigh"    . HEARING LOSS   . Hyperlipidemia   . Hypertension   . PAD (peripheral artery disease) ([place])    a. 03/2016 Periph Angio: L SFA 99p, 28m w/ one vessel runoff. R SFA 171m w/ one vessel runofff;  b. 04/07/2016 PTA L SFA ([place] 1 LS atherecromty w/ DEBA). c. 04/24/16 s/p PTA to R SFA, right tibioperoneal trunk and posterior tibial artery.  . Phlebitis   . Tobacco abuse      Social History   Tobacco Use  . Smoking status: Former Smoker    Packs/day: 0.50    Years: 62.00    Pack years: 31.00    Types: Cigarettes    Last attempt to quit: 08/12/2017    Years since quitting: 0.1  . Smokeless tobacco: Never Used  . Tobacco  comment: Sometimes less-6 7 CIGARETTES A DAY; uses patches sometimes.  Substance Use Topics  . Alcohol use: No    Alcohol/week: 0.0 oz  . Drug use: No    Ms.Echevarria reports that she quit smoking about 8 weeks ago. Her smoking use included cigarettes. She has a 31.00 pack-year smoking history. She has never used smokeless tobacco. She reports that she does not drink alcohol or use drugs.  Tobacco Cessation: Counseling given: Not Answered Comment: Sometimes less-6 7 CIGARETTES A DAY; uses patches sometimes.   Past surgical hx, Family hx, Social hx all reviewed.  Current Outpatient Medications on File Prior to Visit  Medication Sig  . albuterol (PROVENTIL HFA;VENTOLIN HFA) 108 (90 Base) MCG/ACT inhaler Inhale 2 puffs into the lungs every 6 (six) hours as needed for wheezing or shortness of breath.  Marland Kitchen aspirin EC 81 MG EC tablet Take 1 tablet (81 mg total) by mouth daily.  Marland Kitchen atorvastatin (LIPITOR) 40 MG tablet Take 1 tablet (40 mg total) by mouth daily.  . budesonide-formoterol (SYMBICORT) 160-4.5 MCG/ACT inhaler Inhale 2 puffs into the lungs 2 (two) times daily.  . clopidogrel (PLAVIX) 75 MG tablet Take 1 tablet (75 mg total) daily with breakfast by mouth.  . diclofenac sodium (VOLTAREN) 1 % GEL Apply 4 g topically 4 (four) times daily.  Marland Kitchen doxycycline (VIBRA-TABS) 100 MG tablet Take 1 tablet (100 mg total) by mouth 2 (two) times daily.  . ondansetron (ZOFRAN ODT) 8 MG disintegrating tablet Take 1 tablet (8 mg total) by mouth every  8 (eight) hours as needed for nausea or vomiting.  . polyethylene glycol powder (MIRALAX) powder Take 17 g by mouth daily.  Marland Kitchen Spacer/Aero-Holding Chambers (AEROCHAMBER MV) inhaler Use as instructed  . [DISCONTINUED] enoxaparin (LOVENOX) 80 MG/0.8ML SOLN injection Inject 0.8 mLs (80 mg total) into the skin every 12 (twelve) hours.   No current facility-administered medications on file prior to visit.      Allergies  Allergen Reactions  . Codeine Other (See  Comments)    REACTION: Pt c/o "feeling high"  . Morphine Other (See Comments)    REACTION: feels funny and "high"    Review Of Systems:  Constitutional:   No  weight loss, night sweats,  Fevers, chills, fatigue, or  lassitude.  HEENT:   No headaches,  Difficulty swallowing,  Tooth/dental problems, or  Sore throat,                No sneezing, itching, ear ache, nasal congestion, post nasal drip,   CV:  No chest pain,  Orthopnea, PND, swelling in lower extremities, anasarca, dizziness, palpitations, syncope.   GI  No heartburn, indigestion, abdominal pain, nausea, vomiting, diarrhea, change in bowel habits, loss of appetite, bloody stools.   Resp: No shortness of breath with exertion or at rest.  No excess mucus, no productive cough,  No non-productive cough,  No coughing up of blood.  No change in color of mucus.  No wheezing.  No chest wall deformity  Skin: no rash or lesions.  GU: no dysuria, change in color of urine, no urgency or frequency.  No flank pain, no hematuria   MS:  No joint pain or swelling.  No decreased range of motion.  No back pain.  Psych:  No change in mood or affect. No depression or anxiety.  No memory loss.   Vital Signs LMP  (LMP Unknown)    Physical Exam:  General- No distress,  A&Ox3 ENT: No sinus tenderness, TM clear, pale nasal mucosa, no oral exudate,no post nasal drip, no LAN Cardiac: S1, S2, regular rate and rhythm, no murmur Chest: No wheeze/ rales/ dullness; no accessory muscle use, no nasal flaring, no sternal retractions Abd.: Soft Non-tender Ext: No clubbing cyanosis, edema Neuro:  normal strength Skin: No rashes, warm and dry Psych: normal mood and behavior   Assessment/Plan  No problem-specific Assessment & Plan notes found for this encounter.    Magdalen Spatz, NP 10/09/2017  3:14 PM

## 2017-10-11 ENCOUNTER — Other Ambulatory Visit (HOSPITAL_COMMUNITY): Payer: Self-pay

## 2017-10-11 ENCOUNTER — Emergency Department (HOSPITAL_COMMUNITY): Payer: Medicare Other

## 2017-10-11 ENCOUNTER — Encounter (HOSPITAL_COMMUNITY): Payer: Self-pay | Admitting: Emergency Medicine

## 2017-10-11 ENCOUNTER — Inpatient Hospital Stay (HOSPITAL_COMMUNITY)
Admission: EM | Admit: 2017-10-11 | Discharge: 2017-10-13 | DRG: 809 | Disposition: A | Discharge status: Home / Self Care | Payer: Medicare Other | Attending: Internal Medicine | Admitting: Internal Medicine

## 2017-10-11 ENCOUNTER — Other Ambulatory Visit: Payer: Self-pay

## 2017-10-11 DIAGNOSIS — D72829 Elevated white blood cell count, unspecified: Secondary | ICD-10-CM | POA: Diagnosis not present

## 2017-10-11 DIAGNOSIS — R531 Weakness: Secondary | ICD-10-CM | POA: Diagnosis not present

## 2017-10-11 DIAGNOSIS — N183 Chronic kidney disease, stage 3 unspecified: Secondary | ICD-10-CM | POA: Diagnosis present

## 2017-10-11 DIAGNOSIS — Z9862 Peripheral vascular angioplasty status: Secondary | ICD-10-CM

## 2017-10-11 DIAGNOSIS — R079 Chest pain, unspecified: Secondary | ICD-10-CM | POA: Diagnosis not present

## 2017-10-11 DIAGNOSIS — F17200 Nicotine dependence, unspecified, uncomplicated: Secondary | ICD-10-CM | POA: Diagnosis present

## 2017-10-11 DIAGNOSIS — E440 Moderate protein-calorie malnutrition: Secondary | ICD-10-CM | POA: Diagnosis not present

## 2017-10-11 DIAGNOSIS — R071 Chest pain on breathing: Secondary | ICD-10-CM | POA: Diagnosis not present

## 2017-10-11 DIAGNOSIS — Z7902 Long term (current) use of antithrombotics/antiplatelets: Secondary | ICD-10-CM

## 2017-10-11 DIAGNOSIS — D500 Iron deficiency anemia secondary to blood loss (chronic): Secondary | ICD-10-CM | POA: Diagnosis not present

## 2017-10-11 DIAGNOSIS — I499 Cardiac arrhythmia, unspecified: Secondary | ICD-10-CM | POA: Diagnosis not present

## 2017-10-11 DIAGNOSIS — D619 Aplastic anemia, unspecified: Secondary | ICD-10-CM | POA: Diagnosis not present

## 2017-10-11 DIAGNOSIS — R52 Pain, unspecified: Secondary | ICD-10-CM | POA: Diagnosis not present

## 2017-10-11 DIAGNOSIS — H9190 Unspecified hearing loss, unspecified ear: Secondary | ICD-10-CM | POA: Diagnosis not present

## 2017-10-11 DIAGNOSIS — I739 Peripheral vascular disease, unspecified: Secondary | ICD-10-CM | POA: Diagnosis not present

## 2017-10-11 DIAGNOSIS — E785 Hyperlipidemia, unspecified: Secondary | ICD-10-CM | POA: Diagnosis present

## 2017-10-11 DIAGNOSIS — I959 Hypotension, unspecified: Secondary | ICD-10-CM | POA: Diagnosis not present

## 2017-10-11 DIAGNOSIS — Z885 Allergy status to narcotic agent status: Secondary | ICD-10-CM | POA: Diagnosis not present

## 2017-10-11 DIAGNOSIS — R0600 Dyspnea, unspecified: Secondary | ICD-10-CM | POA: Diagnosis not present

## 2017-10-11 DIAGNOSIS — J181 Lobar pneumonia, unspecified organism: Secondary | ICD-10-CM

## 2017-10-11 DIAGNOSIS — J189 Pneumonia, unspecified organism: Secondary | ICD-10-CM | POA: Diagnosis present

## 2017-10-11 DIAGNOSIS — R0789 Other chest pain: Secondary | ICD-10-CM | POA: Diagnosis not present

## 2017-10-11 DIAGNOSIS — J90 Pleural effusion, not elsewhere classified: Secondary | ICD-10-CM | POA: Diagnosis not present

## 2017-10-11 DIAGNOSIS — Z79899 Other long term (current) drug therapy: Secondary | ICD-10-CM | POA: Diagnosis not present

## 2017-10-11 DIAGNOSIS — J449 Chronic obstructive pulmonary disease, unspecified: Secondary | ICD-10-CM | POA: Diagnosis not present

## 2017-10-11 DIAGNOSIS — D649 Anemia, unspecified: Secondary | ICD-10-CM | POA: Diagnosis not present

## 2017-10-11 DIAGNOSIS — I129 Hypertensive chronic kidney disease with stage 1 through stage 4 chronic kidney disease, or unspecified chronic kidney disease: Secondary | ICD-10-CM | POA: Diagnosis present

## 2017-10-11 DIAGNOSIS — Z87891 Personal history of nicotine dependence: Secondary | ICD-10-CM | POA: Diagnosis not present

## 2017-10-11 DIAGNOSIS — R05 Cough: Secondary | ICD-10-CM | POA: Diagnosis not present

## 2017-10-11 DIAGNOSIS — R918 Other nonspecific abnormal finding of lung field: Secondary | ICD-10-CM | POA: Diagnosis not present

## 2017-10-11 DIAGNOSIS — Z947 Corneal transplant status: Secondary | ICD-10-CM | POA: Diagnosis not present

## 2017-10-11 DIAGNOSIS — Z9889 Other specified postprocedural states: Secondary | ICD-10-CM | POA: Diagnosis not present

## 2017-10-11 LAB — CBC WITH DIFFERENTIAL/PLATELET
ABS IMMATURE GRANULOCYTES: 0.1 10*3/uL (ref 0.0–0.1)
BASOS PCT: 0 %
Basophils Absolute: 0 10*3/uL (ref 0.0–0.1)
EOS ABS: 0 10*3/uL (ref 0.0–0.7)
Eosinophils Relative: 0 %
HEMATOCRIT: 15.6 % — AB (ref 36.0–46.0)
Hemoglobin: 4.9 g/dL — CL (ref 12.0–15.0)
Immature Granulocytes: 1 %
Lymphocytes Relative: 12 %
Lymphs Abs: 1.7 10*3/uL (ref 0.7–4.0)
MCH: 28.5 pg (ref 26.0–34.0)
MCHC: 31.4 g/dL (ref 30.0–36.0)
MCV: 90.7 fL (ref 78.0–100.0)
MONO ABS: 1.8 10*3/uL — AB (ref 0.1–1.0)
MONOS PCT: 13 %
Neutro Abs: 10.8 10*3/uL — ABNORMAL HIGH (ref 1.7–7.7)
Neutrophils Relative %: 74 %
PLATELETS: 530 10*3/uL — AB (ref 150–400)
RBC: 1.72 MIL/uL — ABNORMAL LOW (ref 3.87–5.11)
RDW: 15.4 % (ref 11.5–15.5)
WBC: 14.4 10*3/uL — ABNORMAL HIGH (ref 4.0–10.5)

## 2017-10-11 LAB — COMPREHENSIVE METABOLIC PANEL
ALT: 8 U/L — AB (ref 14–54)
AST: 14 U/L — ABNORMAL LOW (ref 15–41)
Albumin: 2.4 g/dL — ABNORMAL LOW (ref 3.5–5.0)
Alkaline Phosphatase: 52 U/L (ref 38–126)
Anion gap: 7 (ref 5–15)
BILIRUBIN TOTAL: 0.8 mg/dL (ref 0.3–1.2)
BUN: 14 mg/dL (ref 6–20)
CALCIUM: 9.2 mg/dL (ref 8.9–10.3)
CO2: 28 mmol/L (ref 22–32)
Chloride: 102 mmol/L (ref 101–111)
Creatinine, Ser: 1.53 mg/dL — ABNORMAL HIGH (ref 0.44–1.00)
GFR calc non Af Amer: 32 mL/min — ABNORMAL LOW (ref >60)
GFR, EST AFRICAN AMERICAN: 37 mL/min — AB (ref >60)
GLUCOSE: 123 mg/dL — AB (ref 65–99)
Potassium: 4.2 mmol/L (ref 3.5–5.1)
Sodium: 137 mmol/L (ref 135–145)
Total Protein: 6 g/dL — ABNORMAL LOW (ref 6.5–8.1)

## 2017-10-11 LAB — URINALYSIS, ROUTINE W REFLEX MICROSCOPIC
BILIRUBIN URINE: NEGATIVE
Glucose, UA: NEGATIVE mg/dL
Ketones, ur: NEGATIVE mg/dL
Nitrite: NEGATIVE
PROTEIN: NEGATIVE mg/dL
Specific Gravity, Urine: 1.014 (ref 1.005–1.030)
pH: 5 (ref 5.0–8.0)

## 2017-10-11 LAB — POC OCCULT BLOOD, ED: Fecal Occult Bld: NEGATIVE

## 2017-10-11 LAB — PREPARE RBC (CROSSMATCH)

## 2017-10-11 LAB — ABO/RH: ABO/RH(D): O NEG

## 2017-10-11 MED ORDER — ALBUTEROL SULFATE (2.5 MG/3ML) 0.083% IN NEBU
2.5000 mg | INHALATION_SOLUTION | RESPIRATORY_TRACT | Status: DC | PRN
  Administered 2017-10-12: 2.5 mg via RESPIRATORY_TRACT
  Filled 2017-10-11: qty 3

## 2017-10-11 MED ORDER — ACETAMINOPHEN 325 MG PO TABS
650.0000 mg | ORAL_TABLET | Freq: Four times a day (QID) | ORAL | Status: DC | PRN
  Filled 2017-10-11: qty 2

## 2017-10-11 MED ORDER — IPRATROPIUM-ALBUTEROL 0.5-2.5 (3) MG/3ML IN SOLN
3.0000 mL | Freq: Three times a day (TID) | RESPIRATORY_TRACT | Status: DC
  Administered 2017-10-11: 3 mL via RESPIRATORY_TRACT
  Filled 2017-10-11: qty 3

## 2017-10-11 MED ORDER — IPRATROPIUM-ALBUTEROL 0.5-2.5 (3) MG/3ML IN SOLN
3.0000 mL | Freq: Three times a day (TID) | RESPIRATORY_TRACT | Status: DC
  Administered 2017-10-12 (×2): 3 mL via RESPIRATORY_TRACT
  Filled 2017-10-11 (×2): qty 3

## 2017-10-11 MED ORDER — ACETAMINOPHEN 500 MG PO TABS
1000.0000 mg | ORAL_TABLET | Freq: Once | ORAL | Status: DC
  Filled 2017-10-11: qty 2

## 2017-10-11 MED ORDER — ATORVASTATIN CALCIUM 40 MG PO TABS
40.0000 mg | ORAL_TABLET | Freq: Every day | ORAL | Status: DC
  Administered 2017-10-12 – 2017-10-13 (×2): 40 mg via ORAL
  Filled 2017-10-11 (×2): qty 1

## 2017-10-11 MED ORDER — ONDANSETRON HCL 4 MG/2ML IJ SOLN: 4.0000 mg | Freq: Four times a day (QID) | INTRAMUSCULAR | Status: DC | PRN

## 2017-10-11 MED ORDER — GUAIFENESIN ER 600 MG PO TB12
600.0000 mg | ORAL_TABLET | Freq: Two times a day (BID) | ORAL | Status: DC
  Filled 2017-10-11 (×4): qty 1

## 2017-10-11 MED ORDER — SODIUM CHLORIDE 0.9 % IV SOLN
Freq: Once | INTRAVENOUS | Status: AC
  Administered 2017-10-11: 19:00:00 via INTRAVENOUS

## 2017-10-11 MED ORDER — ONDANSETRON HCL 4 MG PO TABS: 4.0000 mg | ORAL_TABLET | Freq: Four times a day (QID) | ORAL | Status: DC | PRN

## 2017-10-11 MED ORDER — ACETAMINOPHEN 650 MG RE SUPP: 650.0000 mg | Freq: Four times a day (QID) | RECTAL | Status: DC | PRN
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
## 2017-10-11 NOTE — ED Notes (Signed)
Dinner tray provided

## 2017-10-11 NOTE — ED Notes (Signed)
Occult card collected by Dr. Maryan Rued and in mini lab at this time.

## 2017-10-11 NOTE — H&P (Addendum)
Date: 10/11/2017               Patient Name:  [name] MRN: [id number]  DOB: [birth year] Age / Sex: 77 y.o., female   PCP: Sid Falcon, MD         Medical Service: Internal Medicine Teaching Service         Attending Physician: Dr. Lucious Groves, DO    First Contact: Dr. Isac Sarna Pager: [phone]  Second Contact: Dr. Danford Bad Pager: [phone]       After Hours (After 5p/  First Contact Pager: [phone]  weekends / holidays): Second Contact Pager: [phone]   Chief Complaint: "I just want to walk to my mailbox"  History of Present Illness:  This is a 77 y/o F with history of COPD, tobacco use with 30 pack-year history & recent cessation, recent diagnosis of large RUL mass-like consolidation and mediastinal lymphadenopathy concerning for malignancy and CKD-III. She presents from home with a 57-month history of progressive weakness, pleuritic chest pain, cough and DOE. This acutely worsened over the past week and expressed frustration over not being able to walk to her mailbox due to these complaints. Symptoms reportedly started after quitting smoking in March and notes a step-wise decline since early May when she was initially treated in ED for UTI 5/3. She presented again 5/5 after feeling "loopy" after taking Levaquin for her UTI and was found to be hypotensive. Infectious work-up significant for dense RUL consolidation and follow-up CT with large apical RUL lung mass-like consoidation and mediastinal lymphadenopathy concerning for malignancy. She was discharged with outpatient follow-up and PET scan 5/21 demonstrated hypermetabolic activity in RUL, right hilar and mediastinal lymph nodes.   Since discharge, she followed-up with pulmonology who would was seen in [facility] 5/28 and results of her PET scan were discussed. Patient had a difficult time accepting her cancer diagnosis, citing that God wouldn't help her quit smoking just to be diagnosed with cancer. She was advised to contact  her pulmonologist regarding her concerns and was prescribed Doxycycline as patient noted her cough was productive of green sputum. She did not pick this prescription up as she was afraid to take it.   She admits to pleuritic chest pain, fatigue, generalized weakness, DOE, and cough which is occasionally productive of thick-green sputum. Denied SOB at rest, NSAID use, fevers, chills, chest pain, abdominal pain, hemoptysis, hematochezia, melena, hematemesis, hematuria or dysuria.   ED Course: Vitals: Temp 98.2*, BP 123/65, pulse 83, respirations 20 and was saturating 95% on RA.  2-view CXR: Dense RUL consolidation with right hilar and mediastinal lymphadenopathy, highly concerning for malignancy.  CBC: WBC 14.4, RBC 1.7, Hb 4.9, Hct 15.6, Plts 530. These values were confirmed on repeat.  CMET with CKD and albumin of 2.4 but otherwise wnl, including normal bilirubin.  FOBT negative 2 units PRB were ordered for transfusion and IMTS was contacted for admission of acute anemia.   Meds:  Current Meds  Medication Sig  . albuterol (PROVENTIL HFA;VENTOLIN HFA) 108 (90 Base) MCG/ACT inhaler Inhale 2 puffs into the lungs every 6 (six) hours as needed for wheezing or shortness of breath.  Marland Kitchen atorvastatin (LIPITOR) 40 MG tablet Take 1 tablet (40 mg total) by mouth daily.  . clopidogrel (PLAVIX) 75 MG tablet Take 1 tablet (75 mg total) daily with breakfast by mouth.  . docusate sodium (COLACE) 100 MG capsule Take 200 mg by mouth every other day.   Allergies: Allergies as of 10/11/2017 - Review  Complete 10/11/2017  Allergen Reaction Noted  . Codeine Other (See Comments) 11/28/2009  . Morphine Other (See Comments) 11/28/2009   Past Medical History:  Diagnosis Date  . Arthritis    "left shoulder" (04/07/2016)  . Blind    s/p bilateral corneal transplant; "legally blind in both eyes" (04/07/2016)  . CKD (chronic kidney disease), stage III ([place])   . COPD (chronic obstructive pulmonary disease) ([place])   .  DVT (deep venous thrombosis) ([place]) "years ago"   "right thigh"    . HEARING LOSS   . Hyperlipidemia   . Hypertension   . PAD (peripheral artery disease) ([place])    a. 03/2016 Periph Angio: L SFA 99p, 62m w/ one vessel runoff. R SFA 149m w/ one vessel runofff;  b. 04/07/2016 PTA L SFA ([place] 1 LS atherecromty w/ DEBA). c. 04/24/16 s/p PTA to R SFA, right tibioperoneal trunk and posterior tibial artery.  . Phlebitis   . Tobacco abuse    Family History: Mother, sister, brother and grandmother with unspecified malignancies. Denied any bleeding disorders.   Social History: Tobacco: Former smoker, quit in March. >30 pack-year history Alcohol, Recreational Drugs: Denied  Review of Systems: A complete ROS was negative except as per HPI.   Physical Exam: Blood pressure 123/65, pulse 83, temperature 98.2 F (36.8 C), temperature source Oral, resp. rate 20, height 5' (1.524 m), weight 128 lb (58.1 kg), SpO2 97 %. General: Frail AA female resting comfortably in bed with HOB down. NAD. Pleasant but anxious.  HENT: Hard of hearing. Evidence for BL corneal transplant. Persistent nystagmus. No conjunctival injection, icterus or ptosis. Oropharynx clear, mucous membranes moist. No palpable cervical LAN.  Cardiovascular: Regular rate and rhythm. No murmur or rub appreciated. Pulmonary: CTA BL without wheezes, crackles or rubs. Unlabored breathing.  Abdomen: Soft, non-tender and non-distended. No guarding or rigidity. +bowel sounds.  Extremities: No peripheral edema noted BL. Intact distal pulses. No gross deformities. Skin: Warm, dry. No cyanosis. Psych: Mood anxious with congruent affect. Responds to questions appropriately, but redirects discussion to "where are my blood cells going?" Denies possibility of lung malignancy, often speaking of religous topics  EKG: personally reviewed my interpretation is NSR with rate 70's. No ST segment changes or TWI to suggest acute ischemia.  CXR: personally reviewed  my interpretation is large RUL density with hilar lymphadenopathy.    CBC    Component Value Date/Time   WBC 14.4 (H) 10/11/2017 1559   RBC 1.72 (L) 10/11/2017 1559   HGB 4.9 (LL) 10/11/2017 1559   HGB 8.6 (L) 10/06/2017 0919   HCT 15.6 (L) 10/11/2017 1559   HCT 27.6 (L) 10/06/2017 0919   PLT 530 (H) 10/11/2017 1559   PLT 448 10/06/2017 0919   MCV 90.7 10/11/2017 1559   MCV 91 10/06/2017 0919   MCH 28.5 10/11/2017 1559   MCHC 31.4 10/11/2017 1559   RDW 15.4 10/11/2017 1559   RDW 15.1 10/06/2017 0919   LYMPHSABS 1.7 10/11/2017 1559   LYMPHSABS 1.3 10/06/2017 0919   MONOABS 1.8 (H) 10/11/2017 1559   EOSABS 0.0 10/11/2017 1559   EOSABS 0.0 10/06/2017 0919   BASOSABS 0.0 10/11/2017 1559   BASOSABS 0.0 10/06/2017 0919    Assessment & Plan by Problem: Principal Problem:   Symptomatic anemia Active Problems:   TOBACCO ABUSE   CKD (chronic kidney disease) stage 3, GFR 30-59 ml/min (HCC)   COPD (chronic obstructive pulmonary disease) (HCC)   Right upper lobe consolidation (HCC)  #Symptomatic normocytic anemia #Leukocytosis (with Monocytosis) #Erythropenia #  Thrombocytosis Patient presents today with progressively worsening dyspnea on exertion, diffuse weakness and cough productive of thick green sputum and was found to have acute on chronic anemia (4.9, down from 8.6 5/28). She denies any sources of blood loss and FOBT and UA are negative. In addition, she also has persistent leukocytosis (with elevated absolute monocytes), ongoing erythropenia and thrombocytosis. Differential for anemia includes acute blood-loss, though feel this is unlikely given negative FOBT and no hematuria. In addition she denies any back pain, leg weakness or hemoptysis to suggest a less obvious cause of blood loss. Hemolytic anemia also possible although less likely with normal bilirubin and no thrombocytopenia. Intra-pulmonary bleeding such as diffuse alveolar hemorrhage is also possible, though might expect  hemoptysis or some degree of hypoxia. Bone marrow suppression and/or infiltration possible, especially given persistent erythropenia with inappropriately low-normal reticulocyte count. These hematologic abnormalities could also be related to her suspected malignancy or perhaps infection or even a MDS. -Peripheral smear from sample obtained prior to transfusion, if possible -Transfusing 3 units pRBCs. Follow-up post-transfusion H&H and transfuse if Hb <7.  -Hemolysis panel, though felt to be unlikely -Repeat FOBT in AM to ensure not GIB -Aplastic anemia w/o with EBV and ParvoB19 labs  #RUL Density with R Hilar and Mediastinal LAN #Productive Cough #COPD #History of Tobacco Use She is being evaluated outpatient for her newly-detected large RUL "mass-like" density and lymphadenopathy. PET 5/21 demonstrated hypermetabolic activity in the consolidation and also right hilar and mediastinal lymph nodes. This certainly is concerning for malignancy. Chart review shows she was prescribed a course of doxycycline and mucinex by her pulmonologist after patient complained of a bothersome cough productive of thick-green sputum. She did not fill this medication as she was scared to take it.  After review of imaging, and considering her leukocytosis, worsened dyspnea and cough productive of thick green sputum and failure to take prescribed antibiotics, a COPD exacerbation [though no wheezing] or cryptogenic organizing pneumonia is possible, though perhaps less likely. Ultimately she requires a tissue sample for further evaluation, however she may benefit from high-resolution CT to further characterize this mass-like consolidation and treatment of pneumonia if suggested.  -High resolution CT ordered for further characterization; if suggesting COP would need treatment -If patient were to become febrile, consider empiric antibiotics for either an obstructive pneumonia or COP  #CKD: At baseline (~1.4-1.8). Monitor and  avoid nephrotoxic agents.   #PAD: s/p PTA to R SFA on 04/2016. On Plavix and atorvastatin.  - Holding home Plavix in the setting of symptomatic anemia  - Continue home atorvastatin 40 mg QD   Dispo: Admit patient to Inpatient with expected length of stay greater than 2 midnights.  SignedEinar Gip, DO 10/11/2017, 6:22 PM  Pager: [phone]

## 2017-10-11 NOTE — ED Notes (Signed)
ED Provider at bedside. 

## 2017-10-11 NOTE — Progress Notes (Signed)
New Admission Note:  Arrival Method: By bed from ED around 2015 Mental Orientation: Alert and oriented x4 Telemetry: Box 13, CCMD notified Assessment: Completed Skin: Completed, refer to flowsheets IV: Left AC, blood infusing Pain: Denies Tubes: None Safety Measures: Safety Fall Prevention Plan was given, discussed  Admission: Completed [facility] Orientation: Patient has been orientated to the room, unit and the staff. Family: None  Orders have been reviewed and implemented. Will continue to monitor the patient. Call light has been placed within reach and bed alarm has been activated.   Perry Mount, RN  Phone Number: [phone]

## 2017-10-11 NOTE — ED Notes (Signed)
Regular dinner tray ordered

## 2017-10-11 NOTE — ED Triage Notes (Signed)
Pt BIB EMS for CP. Pt states CP has been going on for months and no one can tell her what's wrong with her. States she only feels CP when she's taking breaths but otherwise does not hurt. A&Ox4; NAD. EDP at bedside.

## 2017-10-11 NOTE — ED Notes (Signed)
Patient transported to X-ray 

## 2017-10-12 ENCOUNTER — Inpatient Hospital Stay (HOSPITAL_COMMUNITY): Payer: Medicare Other

## 2017-10-12 ENCOUNTER — Other Ambulatory Visit: Payer: Self-pay

## 2017-10-12 DIAGNOSIS — I739 Peripheral vascular disease, unspecified: Secondary | ICD-10-CM

## 2017-10-12 DIAGNOSIS — Z7902 Long term (current) use of antithrombotics/antiplatelets: Secondary | ICD-10-CM

## 2017-10-12 DIAGNOSIS — R0600 Dyspnea, unspecified: Secondary | ICD-10-CM

## 2017-10-12 DIAGNOSIS — J181 Lobar pneumonia, unspecified organism: Secondary | ICD-10-CM

## 2017-10-12 DIAGNOSIS — N183 Chronic kidney disease, stage 3 (moderate): Secondary | ICD-10-CM

## 2017-10-12 DIAGNOSIS — Z87891 Personal history of nicotine dependence: Secondary | ICD-10-CM

## 2017-10-12 DIAGNOSIS — R531 Weakness: Secondary | ICD-10-CM

## 2017-10-12 DIAGNOSIS — D500 Iron deficiency anemia secondary to blood loss (chronic): Secondary | ICD-10-CM

## 2017-10-12 DIAGNOSIS — E440 Moderate protein-calorie malnutrition: Secondary | ICD-10-CM

## 2017-10-12 DIAGNOSIS — Z9889 Other specified postprocedural states: Secondary | ICD-10-CM

## 2017-10-12 DIAGNOSIS — Z79899 Other long term (current) drug therapy: Secondary | ICD-10-CM

## 2017-10-12 DIAGNOSIS — R071 Chest pain on breathing: Secondary | ICD-10-CM

## 2017-10-12 DIAGNOSIS — R918 Other nonspecific abnormal finding of lung field: Secondary | ICD-10-CM

## 2017-10-12 DIAGNOSIS — R05 Cough: Secondary | ICD-10-CM

## 2017-10-12 DIAGNOSIS — J449 Chronic obstructive pulmonary disease, unspecified: Secondary | ICD-10-CM

## 2017-10-12 LAB — COMPREHENSIVE METABOLIC PANEL
ALK PHOS: 53 U/L (ref 38–126)
ALT: 10 U/L — ABNORMAL LOW (ref 14–54)
ANION GAP: 12 (ref 5–15)
AST: 14 U/L — ABNORMAL LOW (ref 15–41)
Albumin: 2.3 g/dL — ABNORMAL LOW (ref 3.5–5.0)
BUN: 16 mg/dL (ref 6–20)
CALCIUM: 9 mg/dL (ref 8.9–10.3)
CO2: 23 mmol/L (ref 22–32)
Chloride: 103 mmol/L (ref 101–111)
Creatinine, Ser: 1.46 mg/dL — ABNORMAL HIGH (ref 0.44–1.00)
GFR, EST AFRICAN AMERICAN: 39 mL/min — AB (ref >60)
GFR, EST NON AFRICAN AMERICAN: 33 mL/min — AB (ref >60)
Glucose, Bld: 110 mg/dL — ABNORMAL HIGH (ref 65–99)
POTASSIUM: 3.8 mmol/L (ref 3.5–5.1)
Sodium: 138 mmol/L (ref 135–145)
Total Bilirubin: 1.4 mg/dL — ABNORMAL HIGH (ref 0.3–1.2)
Total Protein: 5.9 g/dL — ABNORMAL LOW (ref 6.5–8.1)

## 2017-10-12 LAB — SAVE SMEAR

## 2017-10-12 LAB — TROPONIN I: Troponin I: <0.03 ng/mL (ref <0.03)

## 2017-10-12 LAB — CBC
HEMATOCRIT: 37.5 % (ref 36.0–46.0)
Hemoglobin: 12.4 g/dL (ref 12.0–15.0)
MCH: 28.7 pg (ref 26.0–34.0)
MCHC: 33.1 g/dL (ref 30.0–36.0)
MCV: 86.8 fL (ref 78.0–100.0)
Platelets: 370 10*3/uL (ref 150–400)
RBC: 4.32 MIL/uL (ref 3.87–5.11)
RDW: 15.3 % (ref 11.5–15.5)
WBC: 13.2 10*3/uL — ABNORMAL HIGH (ref 4.0–10.5)

## 2017-10-12 LAB — RETICULOCYTES
RBC.: 4.32 MIL/uL (ref 3.87–5.11)
RETIC CT PCT: 1 % (ref 0.4–3.1)
Retic Count, Absolute: 43.2 10*3/uL (ref 19.0–186.0)

## 2017-10-12 LAB — LACTATE DEHYDROGENASE: LDH: 142 U/L (ref 98–192)

## 2017-10-12 LAB — PROTIME-INR
INR: 1.11
PROTHROMBIN TIME: 14.2 s (ref 11.4–15.2)

## 2017-10-12 MED ORDER — ENOXAPARIN SODIUM 30 MG/0.3ML ~~LOC~~ SOLN: 30.0000 mg | SUBCUTANEOUS | Status: DC

## 2017-10-12 MED ORDER — NITROGLYCERIN 0.4 MG SL SUBL
0.4000 mg | SUBLINGUAL_TABLET | SUBLINGUAL | Status: DC | PRN
  Administered 2017-10-12: 0.4 mg via SUBLINGUAL

## 2017-10-12 MED ORDER — ENSURE ENLIVE PO LIQD: 237.0000 mL | Freq: Two times a day (BID) | ORAL | Status: DC

## 2017-10-12 MED ORDER — IPRATROPIUM-ALBUTEROL 0.5-2.5 (3) MG/3ML IN SOLN
3.0000 mL | Freq: Two times a day (BID) | RESPIRATORY_TRACT | Status: DC
  Administered 2017-10-12 – 2017-10-13 (×2): 3 mL via RESPIRATORY_TRACT
  Filled 2017-10-12 (×2): qty 3

## 2017-10-12 MED ORDER — NITROGLYCERIN 0.4 MG SL SUBL
SUBLINGUAL_TABLET | SUBLINGUAL | Status: AC
  Filled 2017-10-12: qty 1

## 2017-10-12 NOTE — Evaluation (Signed)
Physical Therapy Evaluation Patient Details Name: [name] MRN: [id number] DOB: 1941/02/05 Today's Date: 10/12/2017   History of Present Illness  [name] is a 77 year old female with past medical history of COPD, tobacco use, recent diagnosis of large right upper lobe consolidation is currently being worked up by pulmonology, CKD stage III.  She presented yesterday for increased fatigue. right upper lobe consolidation  Clinical Impression   Pt admitted with above diagnosis. Pt currently with functional limitations due to the deficits listed below (see PT Problem List). Presents with decr activity tolerance, generalized weakness; She is from an apartment, has an Aide to assist her M-F; Will need to be modified independent to dc home, and I anticipate she will be able to reach that functional level; Briefly discussed her case with HHOT as well;  Pt will benefit from skilled PT to increase their independence and safety with mobility to allow discharge to the venue listed below.       Follow Up Recommendations Home health PT;Other (comment)(also HHRN, HHOT (will defer to OT))    Equipment Recommendations  None recommended by PT    Recommendations for Other Services OT consult(as ordered)     Precautions / Restrictions Precautions Precautions: Fall Precaution Comments: Unsure of her vision      Mobility  Bed Mobility Overal bed mobility: Needs Assistance Bed Mobility: Supine to Sit     Supine to sit: Min guard     General bed mobility comments: Minguard for safety; Slow moving, but not needing physical assist  Transfers Overall transfer level: Needs assistance Equipment used: Rolling walker (2 wheeled) Transfers: Sit to/from Stand Sit to Stand: Min guard         General transfer comment: Cues for hand placement  Ambulation/Gait Ambulation/Gait assistance: Min guard Ambulation Distance (Feet): (pivotal steps bed to chair) Assistive device: Rolling walker  (2 wheeled) Gait Pattern/deviations: Shuffle     General Gait Details: Noted dependence on UEs for balance  Stairs            Wheelchair Mobility    Modified Rankin (Stroke Patients Only)       Balance                                             Pertinent Vitals/Pain Pain Assessment: No/denies pain    Home Living Family/patient expects to be discharged to:: Private residence(Independent living apartment) Living Arrangements: Alone Available Help at Discharge: Family;Personal care attendant;Available PRN/intermittently(Aide M-F in am) Type of Home: Apartment Home Access: Level entry     Home Layout: One level Home Equipment: Walker - 4 wheels      Prior Function Level of Independence: Needs assistance   Gait / Transfers Assistance Needed: Mod indep amb household distances with rollator. Relies on [employer] for transportation; son does grocery shopping  ADL's / Homemaking Assistance Needed: [place] aide M-F for 3 hrs/day, assist with all bathing, cooking, and cleaning.         Hand Dominance        Extremity/Trunk Assessment   Upper Extremity Assessment Upper Extremity Assessment: Defer to OT evaluation    Lower Extremity Assessment Lower Extremity Assessment: Generalized weakness       Communication   Communication: HOH  Cognition Arousal/Alertness: Awake/alert Behavior During Therapy: WFL for tasks assessed/performed Overall Cognitive Status: Within Functional Limits for tasks assessed  General Comments General comments (skin integrity, edema, etc.): No chest pain, session conducted on room Air, O2 sats 96%, HR 103    Exercises     Assessment/Plan    PT Assessment Patient needs continued PT services  PT Problem List Decreased strength;Decreased activity tolerance;Decreased balance;Decreased mobility;Decreased knowledge of use of DME;Decreased safety awareness;Decreased  knowledge of precautions;Cardiopulmonary status limiting activity       PT Treatment Interventions DME instruction;Gait training;Stair training;Functional mobility training;Therapeutic activities;Therapeutic exercise;Balance training;Cognitive remediation;Patient/family education    PT Goals (Current goals can be found in the Care Plan section)  Acute Rehab PT Goals Patient Stated Goal: hopes to go home soon PT Goal Formulation: With patient Time For Goal Achievement: 10/26/17 Potential to Achieve Goals: Good    Frequency Min 3X/week   Barriers to discharge Decreased caregiver support Must be modified independent ot dc home    Co-evaluation               AM-PAC PT "6 Clicks" Daily Activity  Outcome Measure Difficulty turning over in bed (including adjusting bedclothes, sheets and blankets)?: None Difficulty moving from lying on back to sitting on the side of the bed? : A Little Difficulty sitting down on and standing up from a chair with arms (e.g., wheelchair, bedside commode, etc,.)?: A Little Help needed moving to and from a bed to chair (including a wheelchair)?: A Little Help needed walking in hospital room?: A Little Help needed climbing 3-5 steps with a railing? : A Lot 6 Click Score: 18    End of Session Equipment Utilized During Treatment: Gait belt Activity Tolerance: Patient tolerated treatment well Patient left: in chair;with call bell/phone within reach Nurse Communication: Mobility status;Other (comment)(need for chair alarm box) PT Visit Diagnosis: Unsteadiness on feet (R26.81);Muscle weakness (generalized) (M62.81)    Time: 0233-4356 PT Time Calculation (min) (ACUTE ONLY): 15 min   Charges:   PT Evaluation $PT Eval Moderate Complexity: 1 Mod     PT G Codes:        Roney Marion, PT  Acute Rehabilitation Services Pager [phone] Office [phone]   Colletta [place] 10/12/2017, 3:08 PM

## 2017-10-12 NOTE — Progress Notes (Signed)
Paged to bedside for evaluation of substernal chest pain.  Patient awoke from sleep with 9/10 dull, substernal chest pain which is worse with inspiration. Patient unclear if pain is worsened with exertion as she is afraid to move. Does not radiate and is not associated with diaphoresis, nausea, or vomiting.  BP 160s/80, HR 90-100, 96% on room air Gen: Elderly woman laying comfortably in bed, non-diaphoretic, no acute distress CV: RRR, no mumurs, 2+ radial pulses bilaterally, no pitting edema in bilateral lower extremities Pulm: Breathing through pursed lips. Speaking in full sentences without accessory muscle use or nasal flaring. Diffuse wheezing with slightly decreased air movement throughout. No crackles appreciated in bilateral lung fields  A/P: Patient presenting with chest pain which has some typical and atypical features. Will get EKG and troponin, as well as assess response to SLN. Wonder if patient's CP could be related to underlying COPD given breathing through pursed lips on exam, diffuse wheezing, pleuritic nature of pain, and decreased air movement on PE. RN to give breathing treatment this AM. Patient has gotten 3 units pRBC's overnight (~1.2L total blood volume), but does not appear volume overloaded on exam (no edema, crackles, or hypoxia). Will continue to monitor for transfusion related lung injury and volume overload with low threshold to give lasix if needed. Day team to evaluate early this AM and follow above results.

## 2017-10-12 NOTE — H&P (View-Only) (Signed)
Name: [name] MRN: [id number] DOB: Oct 27, 1940    ADMISSION DATE:  10/11/2017 CONSULTATION DATE:  10/12/17  REFERRING MD :  Dr Heber Margaret  REASON FOR CONSULTATION: Abnormal CT chest  STUDIES:  CT chest 09/13/2017 >> right upper lobe apical and posterior segments with consolidation, concerning for possible postobstructive process or mass hidden within pneumonia.  There is significant precarinal mediastinal lymphadenopathy, right hilar adenopathy   PET scan 09/29/2017 >> previously demonstrated masslike consolidation in the right upper lobe with homogeneous high level hypermetabolic activity, SUV max 14.8.  There are associated small subpleural nodules in the right upper lobe there also mildly hypermetabolic.  Small hypermetabolic lingular nodule.  There is a hypermetabolic right hilar and precarinal lymph node.  The precarinal node measures 2.2 x 2.9 cm with an SUV max of 12.5.  HISTORY OF PRESENT ILLNESS:   77 year old active smoker with a history of COPD and a right upper lobe apical and posterior consolidation/mass noted on chest imaging.  She is been followed in our office in order to facilitate the work-up.  A PET scan was done on 09/29/2017 as outlined above.  She was admitted with generalized weakness as well as cough, dyspnea and some pleuritic chest pain.  Her initial lab work showed significant anemia with a hemoglobin of 4.9.  She received packed red blood cells but then her hemoglobin was 12.4 so it was unclear whether the anemia was actually a spurious value.  She does state that she feels better after having the blood.  We are consulted regarding continued work-up for her abnormal CT scan of the chest.  77, chronically ill woman, sitting up to chair in no distress   PAST MEDICAL HISTORY :   has a past medical history of Arthritis, Blind, CKD (chronic kidney disease), stage III ([place]), COPD (chronic obstructive pulmonary disease) ([place]), DVT (deep venous thrombosis) (HCC) ("years  ago"), HEARING LOSS, Hyperlipidemia, Hypertension, PAD (peripheral artery disease) ([place]), Phlebitis, and Tobacco abuse.  has a past surgical history that includes Shoulder open rotator cuff repair (Right, 1973); Cardiac catheterization (N/A, 03/17/2016); Cardiac catheterization (Bilateral, 04/07/2016); Cardiac catheterization (Left, 04/07/2016); Eye surgery (Bilateral); Wrist fracture surgery (Right); Fracture surgery; Patella fracture surgery (Right, 1983); Shoulder open rotator cuff repair (Left, 06/2003); Carpal tunnel release (Right, 04/2003); Shoulder arthroscopy w/ rotator cuff repair (Right, 09/2002); Hammer toe surgery (Left, 01/2010); Cardiac catheterization (04/24/2016); Cardiac catheterization (Right, 04/24/2016); Cardiac catheterization (Right, 04/24/2016); and Cholecystectomy (N/A, 01/16/2017). Prior to Admission medications   Medication Sig Start Date End Date Taking? Authorizing Provider  albuterol (PROVENTIL HFA;VENTOLIN HFA) 108 (90 Base) MCG/ACT inhaler Inhale 2 puffs into the lungs every 6 (six) hours as needed for wheezing or shortness of breath. 08/15/15  Yes Sid Falcon, MD  atorvastatin (LIPITOR) 40 MG tablet Take 1 tablet (40 mg total) by mouth daily. 06/17/17  Yes Sid Falcon, MD  clopidogrel (PLAVIX) 75 MG tablet Take 1 tablet (75 mg total) daily with breakfast by mouth. 03/25/17  Yes Sid Falcon, MD  docusate sodium (COLACE) 100 MG capsule Take 200 mg by mouth every other day.   Yes [provider]  doxycycline (VIBRA-TABS) 100 MG tablet Take 1 tablet (100 mg total) by mouth 2 (two) times daily. Patient not taking: Reported on 10/11/2017 10/06/17   Magdalen Spatz, NP  Spacer/Aero-Holding Chambers (AEROCHAMBER MV) inhaler Use as instructed 10/06/17   Magdalen Spatz, NP  enoxaparin (LOVENOX) 80 MG/0.8ML SOLN injection Inject 0.8 mLs (80 mg total) into the skin every  12 (twelve) hours. 07/09/11 07/22/11  Hoyt Koch, MD   Allergies  Allergen Reactions  .  Codeine Other (See Comments)    REACTION: Pt c/o "feeling high"  . Morphine Other (See Comments)    REACTION: feels funny and "high"    FAMILY HISTORY:  family history includes Cancer in her brother, maternal grandmother, maternal uncle, mother, and sister. SOCIAL HISTORY:  reports that she quit smoking about 2 months ago. Her smoking use included cigarettes. She has a 31.00 pack-year smoking history. She has never used smokeless tobacco. She reports that she does not drink alcohol or use drugs.  REVIEW OF SYSTEMS:   Constitutional: Negative for fever, chills, weight loss, malaise/fatigue and diaphoresis.  HENT: Negative for hearing loss, ear pain, nosebleeds, congestion, sore throat, neck pain, tinnitus and ear discharge.   Eyes: Negative for blurred vision, double vision, photophobia, pain, discharge and redness.  Respiratory: Negative for cough, hemoptysis, sputum production, shortness of breath, wheezing and stridor.   Cardiovascular: Negative for chest pain, palpitations, orthopnea, claudication, leg swelling and PND.  Gastrointestinal: Negative for heartburn, nausea, vomiting, abdominal pain, diarrhea, constipation, blood in stool and melena.  Genitourinary: Negative for dysuria, urgency, frequency, hematuria and flank pain.  Musculoskeletal: Negative for myalgias, back pain, joint pain and falls.  Skin: Negative for itching and rash.  Neurological: Negative for dizziness, tingling, tremors, sensory change, speech change, focal weakness, seizures, loss of consciousness, weakness and headaches.  Endo/Heme/Allergies: Negative for environmental allergies and polydipsia. Does not bruise/bleed easily.  SUBJECTIVE:  She is concerned about her anemia, about whether her labwork was accurate, whether she actually needed to be transfused.  Also understands the relevance of the abnormal CT  VITAL SIGNS: Temp:  [97.8 F (36.6 C)-99.6 F (37.6 C)] 99.6 F (37.6 C) (06/03 0806) Pulse Rate:   [65-95] 93 (06/03 0918) Resp:  [14-24] 18 (06/03 0918) BP: (115-158)/(56-82) 123/60 (06/03 0806) SpO2:  [93 %-100 %] 93 % (06/03 1359) Weight:  [57.2 kg (126 lb 1.7 oz)] 57.2 kg (126 lb 1.7 oz) (06/03 0500)  PHYSICAL EXAMINATION: General: Elderly chronically ill-appearing woman, sitting up in a chair Neuro: Somewhat globally weak, awake, a bit slow to respond but her responses are consistent and accurate.  Very decreased hearing HEENT:  Somewhat deviated gaze, OP clear Cardiovascular: regular, soft mid systolic M, + S4 Lungs:  Bilateral basilar insp crackles.  Abdomen:  Soft, obese, benign, + BS Musculoskeletal:  No deformities Skin:  No rash  Recent Labs  Lab 10/11/17 1559 10/12/17 0706  NA 137 138  K 4.2 3.8  CL 102 103  CO2 28 23  BUN 14 16  CREATININE 1.53* 1.46*  GLUCOSE 123* 110*   Recent Labs  Lab 10/06/17 0919 10/11/17 1559 10/12/17 0706  HGB 8.6* 4.9* 12.4  HCT 27.6* 15.6* 37.5  WBC 12.4* 14.4* 13.2*  PLT 448 530* 370   Dg Chest 2 View  Result Date: 10/11/2017 CLINICAL DATA:  77 year old female with history of chest pain for the past several months. Pain most severe during taking deep breaths. EXAM: CHEST - 2 VIEW COMPARISON:  Chest x-ray 09/13/2017. Chest CT 09/13/2017. PET-CT 09/29/2017. FINDINGS: Previously noted apical mass and right upper lobe volume loss remains highly concerning for primary bronchogenic carcinoma. Widespread interstitial prominence throughout all aspects of the lungs bilaterally, suggestive of underlying interstitial lung disease. No definite acute consolidative airspace disease. No pleural effusions. Heart size is upper limits of normal. Fullness in the right hilar region, concerning for lymphadenopathy. Fullness in  the right paratracheal soft tissues also concerning for adenopathy. Aortic atherosclerosis. IMPRESSION: 1. Findings remain highly concerning for right upper lobe lung cancer with associated right hilar and mediastinal adenopathy. 2.  The appearance of the lungs suggest concurrent interstitial lung disease. 3. Aortic atherosclerosis. Electronically Signed   By: Vinnie Langton M.D.   On: 10/11/2017 16:38    ASSESSMENT / PLAN:  Right upper lobe consolidation, concerning for postobstructive process or primary mass.  Also with significant precarinal lymphadenopathy, concerning for metastatic disease.  Based on appearance and location best strategy for tissue diagnosis would be bronchoscopy with endobronchial ultrasound and nodal biopsies, possibly right upper lobe biopsies.  I explained this to her today and she voiced understanding.  I have arranged for endobronchial ultrasound on Friday at 12:00.  This can be done as an inpatient or as an outpatient.  She will likely be discharged home on 6/4.  The procedure will be done under general anesthesia and she will need someone to be present to drive her home.  She was concerned about transportation, notes that she depends on the [facility] bus to get to MD appointments.  I will attempt to speak with her family to clarify the plan and ensure that she will be able to attend.   Baltazar Apo, MD, PhD 10/12/2017, 4:20 PM [facility] [phone] or if no answer [phone]

## 2017-10-12 NOTE — Consult Note (Signed)
Name: [name] MRN: [id number] DOB: 1940-11-18    ADMISSION DATE:  10/11/2017 CONSULTATION DATE:  10/12/17  REFERRING MD :  Dr Heber [place]  REASON FOR CONSULTATION: Abnormal CT chest  STUDIES:  CT chest 09/13/2017 >> right upper lobe apical and posterior segments with consolidation, concerning for possible postobstructive process or mass hidden within pneumonia.  There is significant precarinal mediastinal lymphadenopathy, right hilar adenopathy   PET scan 09/29/2017 >> previously demonstrated masslike consolidation in the right upper lobe with homogeneous high level hypermetabolic activity, SUV max 14.8.  There are associated small subpleural nodules in the right upper lobe there also mildly hypermetabolic.  Small hypermetabolic lingular nodule.  There is a hypermetabolic right hilar and precarinal lymph node.  The precarinal node measures 2.2 x 2.9 cm with an SUV max of 12.5.  HISTORY OF PRESENT ILLNESS:   77 year old active smoker with a history of COPD and a right upper lobe apical and posterior consolidation/mass noted on chest imaging.  She is been followed in our office in order to facilitate the work-up.  A PET scan was done on 09/29/2017 as outlined above.  She was admitted with generalized weakness as well as cough, dyspnea and some pleuritic chest pain.  Her initial lab work showed significant anemia with a hemoglobin of 4.9.  She received packed red blood cells but then her hemoglobin was 12.4 so it was unclear whether the anemia was actually a spurious value.  She does state that she feels better after having the blood.  We are consulted regarding continued work-up for her abnormal CT scan of the chest.  Elderly, chronically ill woman, sitting up to chair in no distress   PAST MEDICAL HISTORY :   has a past medical history of Arthritis, Blind, CKD (chronic kidney disease), stage III ([place]), COPD (chronic obstructive pulmonary disease) ([place]), DVT (deep venous thrombosis) ([facility]) ("years  ago"), HEARING LOSS, Hyperlipidemia, Hypertension, PAD (peripheral artery disease) ([place]), Phlebitis, and Tobacco abuse.  has a past surgical history that includes Shoulder open rotator cuff repair (Right, 1973); Cardiac catheterization (N/A, 03/17/2016); Cardiac catheterization (Bilateral, 04/07/2016); Cardiac catheterization (Left, 04/07/2016); Eye surgery (Bilateral); Wrist fracture surgery (Right); Fracture surgery; Patella fracture surgery (Right, 1983); Shoulder open rotator cuff repair (Left, 06/2003); Carpal tunnel release (Right, 04/2003); Shoulder arthroscopy w/ rotator cuff repair (Right, 09/2002); Hammer toe surgery (Left, 01/2010); Cardiac catheterization (04/24/2016); Cardiac catheterization (Right, 04/24/2016); Cardiac catheterization (Right, 04/24/2016); and Cholecystectomy (N/A, 01/16/2017). Prior to Admission medications   Medication Sig Start Date End Date Taking? Authorizing Provider  albuterol (PROVENTIL HFA;VENTOLIN HFA) 108 (90 Base) MCG/ACT inhaler Inhale 2 puffs into the lungs every 6 (six) hours as needed for wheezing or shortness of breath. 08/15/15  Yes Sid Falcon, MD  atorvastatin (LIPITOR) 40 MG tablet Take 1 tablet (40 mg total) by mouth daily. 06/17/17  Yes Sid Falcon, MD  clopidogrel (PLAVIX) 75 MG tablet Take 1 tablet (75 mg total) daily with breakfast by mouth. 03/25/17  Yes Sid Falcon, MD  docusate sodium (COLACE) 100 MG capsule Take 200 mg by mouth every other day.   Yes [provider]  doxycycline (VIBRA-TABS) 100 MG tablet Take 1 tablet (100 mg total) by mouth 2 (two) times daily. Patient not taking: Reported on 10/11/2017 10/06/17   Magdalen Spatz, NP  Spacer/Aero-Holding Chambers (AEROCHAMBER MV) inhaler Use as instructed 10/06/17   Magdalen Spatz, NP  enoxaparin (LOVENOX) 80 MG/0.8ML SOLN injection Inject 0.8 mLs (80 mg total) into the skin every  12 (twelve) hours. 07/09/11 07/22/11  Hoyt Koch, MD   Allergies  Allergen Reactions  .  Codeine Other (See Comments)    REACTION: Pt c/o "feeling high"  . Morphine Other (See Comments)    REACTION: feels funny and "high"    FAMILY HISTORY:  family history includes Cancer in her brother, maternal grandmother, maternal uncle, mother, and sister. SOCIAL HISTORY:  reports that she quit smoking about 2 months ago. Her smoking use included cigarettes. She has a 31.00 pack-year smoking history. She has never used smokeless tobacco. She reports that she does not drink alcohol or use drugs.  REVIEW OF SYSTEMS:   Constitutional: Negative for fever, chills, weight loss, malaise/fatigue and diaphoresis.  HENT: Negative for hearing loss, ear pain, nosebleeds, congestion, sore throat, neck pain, tinnitus and ear discharge.   Eyes: Negative for blurred vision, double vision, photophobia, pain, discharge and redness.  Respiratory: Negative for cough, hemoptysis, sputum production, shortness of breath, wheezing and stridor.   Cardiovascular: Negative for chest pain, palpitations, orthopnea, claudication, leg swelling and PND.  Gastrointestinal: Negative for heartburn, nausea, vomiting, abdominal pain, diarrhea, constipation, blood in stool and melena.  Genitourinary: Negative for dysuria, urgency, frequency, hematuria and flank pain.  Musculoskeletal: Negative for myalgias, back pain, joint pain and falls.  Skin: Negative for itching and rash.  Neurological: Negative for dizziness, tingling, tremors, sensory change, speech change, focal weakness, seizures, loss of consciousness, weakness and headaches.  Endo/Heme/Allergies: Negative for environmental allergies and polydipsia. Does not bruise/bleed easily.  SUBJECTIVE:  She is concerned about her anemia, about whether her labwork was accurate, whether she actually needed to be transfused.  Also understands the relevance of the abnormal CT  VITAL SIGNS: Temp:  [97.8 F (36.6 C)-99.6 F (37.6 C)] 99.6 F (37.6 C) (06/03 0806) Pulse Rate:   [65-95] 93 (06/03 0918) Resp:  [14-24] 18 (06/03 0918) BP: (115-158)/(56-82) 123/60 (06/03 0806) SpO2:  [93 %-100 %] 93 % (06/03 1359) Weight:  [57.2 kg (126 lb 1.7 oz)] 57.2 kg (126 lb 1.7 oz) (06/03 0500)  PHYSICAL EXAMINATION: General: Elderly chronically ill-appearing woman, sitting up in a chair Neuro: Somewhat globally weak, awake, a bit slow to respond but her responses are consistent and accurate.  Very decreased hearing HEENT:  Somewhat deviated gaze, OP clear Cardiovascular: regular, soft mid systolic M, + S4 Lungs:  Bilateral basilar insp crackles.  Abdomen:  Soft, obese, benign, + BS Musculoskeletal:  No deformities Skin:  No rash  Recent Labs  Lab 10/11/17 1559 10/12/17 0706  NA 137 138  K 4.2 3.8  CL 102 103  CO2 28 23  BUN 14 16  CREATININE 1.53* 1.46*  GLUCOSE 123* 110*   Recent Labs  Lab 10/06/17 0919 10/11/17 1559 10/12/17 0706  HGB 8.6* 4.9* 12.4  HCT 27.6* 15.6* 37.5  WBC 12.4* 14.4* 13.2*  PLT 448 530* 370   Dg Chest 2 View  Result Date: 10/11/2017 CLINICAL DATA:  77 year old female with history of chest pain for the past several months. Pain most severe during taking deep breaths. EXAM: CHEST - 2 VIEW COMPARISON:  Chest x-ray 09/13/2017. Chest CT 09/13/2017. PET-CT 09/29/2017. FINDINGS: Previously noted apical mass and right upper lobe volume loss remains highly concerning for primary bronchogenic carcinoma. Widespread interstitial prominence throughout all aspects of the lungs bilaterally, suggestive of underlying interstitial lung disease. No definite acute consolidative airspace disease. No pleural effusions. Heart size is upper limits of normal. Fullness in the right hilar region, concerning for lymphadenopathy. Fullness in  the right paratracheal soft tissues also concerning for adenopathy. Aortic atherosclerosis. IMPRESSION: 1. Findings remain highly concerning for right upper lobe lung cancer with associated right hilar and mediastinal adenopathy. 2.  The appearance of the lungs suggest concurrent interstitial lung disease. 3. Aortic atherosclerosis. Electronically Signed   By: Vinnie Langton M.D.   On: 10/11/2017 16:38    ASSESSMENT / PLAN:  Right upper lobe consolidation, concerning for postobstructive process or primary mass.  Also with significant precarinal lymphadenopathy, concerning for metastatic disease.  Based on appearance and location best strategy for tissue diagnosis would be bronchoscopy with endobronchial ultrasound and nodal biopsies, possibly right upper lobe biopsies.  I explained this to her today and she voiced understanding.  I have arranged for endobronchial ultrasound on Friday at 12:00.  This can be done as an inpatient or as an outpatient.  She will likely be discharged home on 6/4.  The procedure will be done under general anesthesia and she will need someone to be present to drive her home.  She was concerned about transportation, notes that she depends on the [facility] bus to get to MD appointments.  I will attempt to speak with her family to clarify the plan and ensure that she will be able to attend.   Baltazar Apo, MD, PhD 10/12/2017, 4:20 PM [facility] [phone] or if no answer [phone]

## 2017-10-12 NOTE — Progress Notes (Signed)
OT Cancellation Note  Patient Details Name: [name] MRN: [id number] DOB: Feb 19, 1941   Cancelled Treatment:    Reason Eval/Treat Not Completed: Patient declined, no reason specified; pt just gotten back to bed with RN assist, reports "she is cold and tired", and declining OOB or EOB activity at this time despite multiple attempts/encouragement. Will check back as schedule allows.   Lou Cal, OT Pager [phone] 10/12/2017   Raymondo Band 10/12/2017, 3:37 PM

## 2017-10-12 NOTE — Progress Notes (Signed)
Subjective:  No acute events overnight. Patient states she is feeling better this morning. She is very concerned she lost a significant amount of blood. Explained to patient false low lab result. However, patient remains concerned about possible intra-pulmonary bleeding (reports no hemoptysis).   Objective:  Vital signs in last 24 hours: Vitals:   10/12/17 0708 10/12/17 0806 10/12/17 0918 10/12/17 1359  BP: 139/71 123/60    Pulse: 86 95 93   Resp: (!) 24 20 18    Temp:  99.6 F (37.6 C)    TempSrc:  Oral    SpO2: 100% 95% 94% 93%  Weight:      Height:       Physical Exam  Constitutional: She is oriented to person, place, and time.  Frail, elderly female lying in bed in no acute distress   Cardiovascular: Normal rate, regular rhythm and normal heart sounds. Exam reveals no gallop and no friction rub.  No murmur heard. Pulmonary/Chest:  Decreased breath sounds on R upper lobe, lungs clear otherwise, no increased WOB on room air   Abdominal: Soft. Bowel sounds are normal. She exhibits no distension. There is no tenderness.  Musculoskeletal: She exhibits no edema.  Neurological: She is alert and oriented to person, place, and time.    Assessment/Plan:  Principal Problem:   Right upper lobe consolidation (HCC) Active Problems:   TOBACCO ABUSE   CKD (chronic kidney disease) stage 3, GFR 30-59 ml/min (HCC)   COPD (chronic obstructive pulmonary disease) (HCC)   Malnutrition of moderate degree  # Progressive dyspnea, pleuritic chest pain, and diffuse weakness:  Patient presented with progressive worsening DOE, diffuse weakness and productive cough and was found to have acute on chronic anemia (4.9, down from 8.6 5/28). She received a 3 units of pRBCs with overcorrection in Hgb to 12.4 suggestive of a falsely low result at the time of admission. I do not believe patient's symptoms are due to acute on chronic anemia. I suspect these are related to ongoing pulmonary process (mass vs  consolidation) for which she needs further evaluation as described below.  - S/p 3 units pRBCs with overcorrection on Hgb  - LDH, haptoglobin, EBV, parvovirus antibody ordered though would expect these to be normal  - Discontinue repeat FOBT  - Further workup for RUL mass as below  - PT/OT evaluation pending   #RUL Density with R Hilar and Mediastinal LAN #Productive Cough #COPD  #History of Tobacco Use She is being evaluated outpatient by pulmonology for a recently detected large RUL "mass-like" density and lymphadenopathy. PET 5/21 demonstrated hypermetabolic activity in the consolidation and also right hilar and mediastinal lymph nodes highly concerning for malignancy. She has a follow up appointment with pulm on 6/5. Ultimately she requires a tissue sample for further evaluation, however she may benefit from high-resolution CT to further characterize this mass-like consolidation and treatment of pneumonia if suggested.  I have discussed her case with Dr. Lamonte Sakai who she is supposed to follow up with as an outpatient. He will evaluate patient and discuss EBUS tentatively planned for 6/7.  - High resolution CT ordered for further characterization  - If patient were to become febrile, consider empiric antibiotics for either an obstructive pneumonia or COP  #CKD: At baseline (~1.4-1.8).  - Continue to monitor  - Avoid nephrotoxic agents   #PAD:s/p PTA to R SFA on 04/2016. On Plavix and atorvastatin.  - Holding home Plavix, may have bronchoscopy while inpatient  - Continue home atorvastatin 40 mg QD  Dispo: Anticipated discharge in approximately today -1 pending evaluation and recommendation by pulmonology (Dr. Lamonte Sakai).   Welford Roche, MD 10/12/2017, 2:02 PM Pager: [phone]

## 2017-10-12 NOTE — Discharge Instructions (Signed)

## 2017-10-12 NOTE — Progress Notes (Signed)
Initial Nutrition Assessment  DOCUMENTATION CODES:   Non-severe (moderate) malnutrition in context of chronic illness  INTERVENTION:   - Ensure Enlive po BID, each supplement provides 350 kcal and 20 grams of protein (chocolate)  - Encourage PO intake  NUTRITION DIAGNOSIS:   Moderate Malnutrition related to chronic illness, poor appetite(COPD, CKD stage III) as evidenced by mild fat depletion, mild muscle depletion, moderate muscle depletion, percent weight loss(12.1% weight loss in 1 month).  GOAL:   Patient will meet greater than or equal to 90% of their needs  MONITOR:   PO intake, Supplement acceptance, Weight trends, Labs  REASON FOR ASSESSMENT:   Malnutrition Screening Tool    ASSESSMENT:   77 year old female who presented to the ED with chest pain and weakness. PMH significant for COPD, tobacco use with recent cessation, CKD stage III, PAD, hypertension, hyperlipidemia, and recent diagnosis of right upper lobe lung mass and mediastinal lymphadenopathy concerning for malignancy.  Spoke with pt at bedside who reports that she had not yet received her breakfast and did not think she would eat it when it came. Encouraged pt to try some of breakfast items. Pt agreeable to receiving Ensure Enlive during hospitalization. RD ordered BID.  Pt states that she hasn't "had much of an appetite" due to constipation and feeling weak. Pt reports that she just discussed her constipation with MD who is going to order Dulcolax for her.  Pt reports eating 3 meals daily. Breakfast includes eggs and bacon. Lunch includes a sandwich. Dinner includes a sandwich and a popsicle. Pt drinks Pepsi and water throughout the day. Pt denies any issues chewing or swallowing.  Pt reports her UBW as 148-150 lbs and that she last weighed this 3-4 months ago. Pt believes that weight loss is related to her "not eating much."  Per weight history in chart, pt has lost 17 lbs in the past month. This is a 12.1%  weight loss in 1 month which is severe and significant for timeframe.  Medications reviewed.  Labs reviewed: WBC 13.2 (H), creatinine 1.53 (H)  NUTRITION - FOCUSED PHYSICAL EXAM:    Most Recent Value  Orbital Region  Mild depletion  Upper Arm Region  Mild depletion  Thoracic and Lumbar Region  Mild depletion  Buccal Region  No depletion  Temple Region  Mild depletion  Clavicle Bone Region  Mild depletion  Clavicle and Acromion Bone Region  Moderate depletion  Scapular Bone Region  Unable to assess  Dorsal Hand  Mild depletion  Patellar Region  Mild depletion  Anterior Thigh Region  Mild depletion  Posterior Calf Region  Mild depletion  Edema (RD Assessment)  Mild [bilateral lower extremities]  Hair  Reviewed  Eyes  Reviewed [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 Yes; Fluid consistency: Thin  Diet effective now          EDUCATION NEEDS:   No education needs have been identified at this time  Skin:  Skin Assessment: Reviewed RN Assessment  Last BM:  10/11/17  Height:   Ht Readings from Last 1 Encounters:  10/11/17 5' (1.524 m)    Weight:   Wt Readings from Last 1 Encounters:  10/12/17 126 lb 1.7 oz (57.2 kg)    Ideal Body Weight:  45.5 kg  BMI:  Body mass index is 24.63 kg/m.  Estimated Nutritional Needs:   Kcal:  1450-1650 kcal/day  Protein:  70-85 grams/day  Fluid:  >/= 1.5 L/day    Gaynell Face, MS, RD, LDN Pager: [phone] Weekend/After Hours: [phone]

## 2017-10-12 NOTE — Progress Notes (Signed)
PT Cancellation Note  Patient Details Name: [name] MRN: [id number] DOB: 1940-09-30   Cancelled Treatment:    Reason Eval/Treat Not Completed: Medical issues which prohibited therapy   Noted episode of chest pain this AM;   Await results of EKG and troponins, and will discuss with RN before proceeding with PT exam;  [name], [place] Pager [phone] Office [phone]    [name] 10/12/2017, 8:28 AM

## 2017-10-13 ENCOUNTER — Telehealth: Payer: Self-pay | Admitting: Acute Care

## 2017-10-13 ENCOUNTER — Telehealth: Payer: Self-pay | Admitting: Emergency Medicine

## 2017-10-13 DIAGNOSIS — D649 Anemia, unspecified: Secondary | ICD-10-CM

## 2017-10-13 DIAGNOSIS — Z885 Allergy status to narcotic agent status: Secondary | ICD-10-CM

## 2017-10-13 LAB — BPAM RBC
BLOOD PRODUCT EXPIRATION DATE: 201906302359
BLOOD PRODUCT EXPIRATION DATE: 201907012359
BLOOD PRODUCT EXPIRATION DATE: 201907012359
ISSUE DATE / TIME: 201906030113
ISSUE DATE / TIME: 201906021846
ISSUE DATE / TIME: 201906022206
Unit Type and Rh: 9500
Unit Type and Rh: 9500
Unit Type and Rh: 9500

## 2017-10-13 LAB — CBC
HCT: 34.5 % — ABNORMAL LOW (ref 36.0–46.0)
HEMOGLOBIN: 11.4 g/dL — AB (ref 12.0–15.0)
MCH: 28.6 pg (ref 26.0–34.0)
MCHC: 33 g/dL (ref 30.0–36.0)
MCV: 86.7 fL (ref 78.0–100.0)
PLATELETS: 364 10*3/uL (ref 150–400)
RBC: 3.98 MIL/uL (ref 3.87–5.11)
RDW: 15.4 % (ref 11.5–15.5)
WBC: 12.1 10*3/uL — AB (ref 4.0–10.5)

## 2017-10-13 LAB — TYPE AND SCREEN
ABO/RH(D): O NEG
Antibody Screen: NEGATIVE
UNIT DIVISION: 0
Unit division: 0
Unit division: 0

## 2017-10-13 LAB — EPSTEIN-BARR VIRUS VCA ANTIBODY PANEL
EBV Early Antigen Ab, IgG: >150 U/mL — ABNORMAL HIGH (ref 0.0–8.9)
EBV NA IGG: 54.7 U/mL — AB (ref 0.0–17.9)

## 2017-10-13 LAB — PARVOVIRUS B19 ANTIBODY, IGG AND IGM
PAROVIRUS B19 IGG ABS: 6.3 {index} — AB (ref 0.0–0.8)
PAROVIRUS B19 IGM ABS: 0.2 {index} (ref 0.0–0.8)

## 2017-10-13 LAB — HAPTOGLOBIN: HAPTOGLOBIN: 378 mg/dL — AB (ref 34–200)

## 2017-10-13 LAB — PATHOLOGIST SMEAR REVIEW

## 2017-10-13 MED ORDER — TRAMADOL HCL 50 MG PO TABS
50.0000 mg | ORAL_TABLET | Freq: Once | ORAL | Status: AC
  Administered 2017-10-13: 50 mg via ORAL
  Filled 2017-10-13: qty 1

## 2017-10-13 NOTE — Telephone Encounter (Signed)
Appt changed to a 30 minute slot. Nothing further is needed.

## 2017-10-13 NOTE — Progress Notes (Signed)
   Subjective:  No acute events overnight. Patient states she is feeling better since blood transfusion 2 days ago. She reports no complaints this morning. She is aware of bronchoscopy with EBUS scheduled for Friday. Eager to go home.   Objective:  Vital signs in last 24 hours: Vitals:   10/12/17 1857 10/12/17 2023 10/12/17 2053 10/13/17 0459  BP: 122/64  118/62 138/76  Pulse: 81  85 86  Resp: 20  16 16   Temp: 98.8 F (37.1 C)  98 F (36.7 C) 98.2 F (36.8 C)  TempSrc: Oral   Oral  SpO2: 98% 98% 97% 95%  Weight:   126 lb 1.7 oz (57.2 kg)   Height:       Physical Exam  Constitutional: She is oriented to person, place, and time.  Thin, elderly female resting comfortably in bed   Cardiovascular: Normal rate, regular rhythm, normal heart sounds and intact distal pulses. Exam reveals no gallop and no friction rub.  No murmur heard. Pulmonary/Chest:  Decreased breat sounds on R upper lung field, otherwise clear to auscultation with no increased work of breathing on room air   Abdominal: Soft. Bowel sounds are normal. She exhibits no distension. There is no tenderness.  Musculoskeletal: She exhibits no edema.  Neurological: She is alert and oriented to person, place, and time.    Assessment/Plan:  Principal Problem:   Right upper lobe consolidation (HCC) Active Problems:   TOBACCO ABUSE   CKD (chronic kidney disease) stage 3, GFR 30-59 ml/min (HCC)   COPD (chronic obstructive pulmonary disease) (HCC)   Malnutrition of moderate degree  # Progressive dyspnea, pleuritic chest pain, and diffuse weakness # RUL Density with R Hilar and Mediastinal LAN # COPD  # History of Tobacco Use Patient presented with progressive worsening DOE,diffuse weakness and productive cough now thought to be secondary to RUL process (mass vs post-obstructive process). She is being evaluated outpatient by pulmonology for a recently detected large RUL "mass-like" density and lymphadenopathy. PET 5/21  demonstrated hypermetabolic activity in the consolidation and also right hilar and mediastinal lymph nodes highly concerning for malignancy. She has a follow up appointment with pulm on 6/5 and a bronchoscopy with EBUS on 6/7 at 12PM.  - High resolution CT ordered for further characterization, report pending  - If patient were to become febrile, consider empiric antibiotics for either an obstructive pneumonia  - PT recommending HH and OT evaluation pending (patient declined yesterday)    #CKD:At baseline (~1.4-1.8).  - Avoid nephrotoxic agents   #PAD:s/p PTA to R SFA on 04/2016. On Plavix and atorvastatin.  - Holding home Plavix, will resume at discharge but patient will have to hold on day prior to bronchoscopy  - Continue home atorvastatin 40 mg QD   Dispo: Anticipated discharge today after completion of HRCT.   Welford Roche, MD 10/13/2017, 6:41 AM Pager: [phone]

## 2017-10-13 NOTE — Telephone Encounter (Signed)
I tried to call the patient's son [name] today to review the planned EBUS with him, discuss details, insure that she would have transportation. I was unable to get him. Will continue to try. The plan as it stands it to perform EBUS under general anesthesia on Friday 10/16/17.

## 2017-10-13 NOTE — Progress Notes (Signed)
Patient discharged to Home with family member. After visit Summary reviewed. Patient capable of reverbalizing medications and follow up visits. No signs and symptoms of distress noted. Patient educated to return to the ED in the case of an emergency. Dillon Bjork RN

## 2017-10-13 NOTE — Care Management Note (Signed)
Case Management Note  Patient Details  Name: DONATA REDDICK MRN: [id number] Date of Birth: Mar 01, 1941  Subjective/Objective:               Patient from [place], will have family provide transportation home. Active w [facility], placed resumption orders and notifed Butch Penny, clinical liaison of DC today. Patient also has PCS through Medicaid that covers 2.5 hours every morning M-F. Has DME rollator at home.      Action/Plan:  DC to home w [place].  Expected Discharge Date:  10/13/17               Expected Discharge Plan:  [place]  In-House Referral:     Discharge planning Services  CM Consult  Post Acute Care Choice:  Home Health, Resumption of Svcs/PTA Provider Choice offered to:  Patient  DME Arranged:    DME Agency:     HH Arranged:  RN, PT [place] Agency:  [facility]  Status of Service:  Completed, signed off  If discussed at [place] of Stay Meetings, dates discussed:    Additional Comments:  Carles Collet, RN 10/13/2017, 10:44 AM

## 2017-10-13 NOTE — Discharge Summary (Signed)
Name: [name] MRN: [id number] DOB: 1940-10-12 77 y.o. PCP: Sid Falcon, MD  Date of Admission: 10/11/2017  3:34 PM Date of Discharge: 10/13/2017 Attending Physician: Dr. Heber [place]   Discharge Diagnosis: 1. Right upper lobe consolidation  2. Chronic kidney disease stage III 3. COPD  Principal Problem:   Right upper lobe consolidation (HCC) Active Problems:   TOBACCO ABUSE   CKD (chronic kidney disease) stage 3, GFR 30-59 ml/min (HCC)   COPD (chronic obstructive pulmonary disease) (HCC)   Malnutrition of moderate degree   Symptomatic anemia   Discharge Medications: Allergies as of 10/13/2017      Reactions   Codeine Other (See Comments)   REACTION: Pt c/o "feeling high"   Morphine Other (See Comments)   REACTION: feels funny and "high"      Medication List    STOP taking these medications   doxycycline 100 MG tablet Commonly known as:  VIBRA-TABS     TAKE these medications   AEROCHAMBER MV inhaler Use as instructed   albuterol 108 (90 Hernandez) MCG/ACT inhaler Commonly known as:  PROVENTIL HFA;VENTOLIN HFA Inhale 2 puffs into the lungs every 6 (six) hours as needed for wheezing or shortness of breath.   atorvastatin 40 MG tablet Commonly known as:  LIPITOR Take 1 tablet (40 mg total) by mouth daily.   clopidogrel 75 MG tablet Commonly known as:  PLAVIX Take 1 tablet (75 mg total) daily with breakfast by mouth.   docusate sodium 100 MG capsule Commonly known as:  COLACE Take 200 mg by mouth every other day.       Disposition and follow-up:   [name] was discharged from [facility] in Stable condition.  At the hospital follow up visit please address:  1.  Please assess for ongoing symptoms. Please ensure patient has follow up with pulmonology and knows where to go for bronchoscopy with EBUS on 6/7 (to be done in hospital).   2.  Labs / imaging needed at time of follow-up: CBC to check Hgb   3.  Pending labs/ test  needing follow-up: None  Follow-up Appointments: Follow-up Information    Sid Falcon, MD Follow up.   Specialty:  Internal Medicine Why:  Please keep your appointment with your regular doctor tomorrow.  Contact information: [place] [phone] [place] Hospital Course by problem list: Principal Problem:   Right upper lobe consolidation (HCC) Active Problems:   TOBACCO ABUSE   CKD (chronic kidney disease) stage 3, GFR 30-59 ml/min (HCC)   COPD (chronic obstructive pulmonary disease) (HCC)   Malnutrition of moderate degree   Symptomatic anemia   # Right upper lobe lung mass highly suspicious for malignancy # COPD # History of tobacco use  Patient presented with 2-week history of progressive dyspnea on exertion, weakness, and pleuritic chest pain.  She was found to have a hemoglobin of 4.9 on admission and symptoms were initially thought to be secondary to symptomatic anemia. However, patient's hemoglobin corrected to 12.4 after receiving 3 units of pRBCs and initial hemoglobin level thought to be falsely low.  Her symptoms were then attributed to the right upper lobe mass that was found a month ago during previous admission.  She follows up with pulmonology and had a PET scan recently (5/21) that show hypermetabolic activity of the mass as well as several hilar and mediastinal lymph nodes. This is concerning for malignancy in  a patient with COPD with long history of smoking (31 pack year). Dr.Byrum was consulted during this admission who scheduled patient for bronchoscopy with EBUS on 6/7.   # Chronic kidney disease stage III: Patient's renal function remained at baseline during this admission.   Discharge Vitals:   BP (!) 112/55 (BP Location: Right Arm)   Pulse 79   Temp 98 F (36.7 C) (Oral)   Resp 16   Ht 5' (1.524 m)   Wt 126 lb 1.7 oz (57.2 kg)   LMP  (LMP Unknown)   SpO2 96%   BMI 24.63 kg/m   Pertinent Labs, Studies, and  Procedures:  CBC Latest Ref Rng & Units 10/13/2017 10/12/2017 10/11/2017  WBC 4.0 - 10.5 K/uL 12.1(H) 13.2(H) 14.4(H)  Hemoglobin 12.0 - 15.0 g/dL 11.4(L) 12.4 4.9(LL)  Hematocrit 36.0 - 46.0 % 34.5(L) 37.5 15.6(L)  Platelets 150 - 400 K/uL 364 370 530(H)   BMP Latest Ref Rng & Units 10/12/2017 10/11/2017 09/15/2017  Glucose 65 - 99 mg/dL 110(H) 123(H) 130(H)  BUN 6 - 20 mg/dL 16 14 21(H)  Creatinine 0.44 - 1.00 mg/dL 1.46(H) 1.53(H) 1.83(H)  BUN/Creat Ratio 12 - 28 - - -  Sodium 135 - 145 mmol/L 138 137 139  Potassium 3.5 - 5.1 mmol/L 3.8 4.2 4.0  Chloride 101 - 111 mmol/L 103 102 106  CO2 22 - 32 mmol/L 23 28 24   Calcium 8.9 - 10.3 mg/dL 9.0 9.2 9.0   CXR 10/11/2017: FINDINGS: Previously noted apical mass and right upper lobe volume loss remains highly concerning for primary bronchogenic carcinoma. Widespread interstitial prominence throughout all aspects of the lungs bilaterally, suggestive of underlying interstitial lung disease. No definite acute consolidative airspace disease. No pleural effusions. Heart size is upper limits of normal. Fullness in the right hilar region, concerning for lymphadenopathy. Fullness in the right paratracheal soft tissues also concerning for adenopathy. Aortic atherosclerosis.  High resolution chest CT 10/12/2017: IMPRESSION: 1. Large right upper lobe mass with associated right hilar and mediastinal lymphadenopathy redemonstrated, as above. 2. There are changes in the lungs suggestive of interstitial lung disease. At this time, the pattern is considered indeterminate for usual interstitial pneumonia (UIP), however, given the slight craniocaudal gradient underlying UIP is suspected. Attention on followup studies is recommended. 3. Small right pleural effusion lying dependently. 4. Aortic atherosclerosis, in addition to left main and 3 vessel coronary artery disease. Assessment for potential risk factor modification, dietary therapy or pharmacologic therapy  may be warranted, if clinically indicated.   Discharge Instructions: Discharge Instructions    Call MD for:  difficulty breathing, headache or visual disturbances   Complete by:  As directed    Call MD for:  extreme fatigue   Complete by:  As directed    Call MD for:  temperature >100.4   Complete by:  As directed    Diet - low sodium heart healthy   Complete by:  As directed    Discharge instructions   Complete by:  As directed    [name],  You were admitted to the hospital due to worsening shortness of breath and fatigue that is being caused by a mass in your right lung. The lung doctor has scheduled a biopsy for you on Friday at noon. You will have to come to the hospital to get this procedure done. Please ensure to follow up with Dr. Daryll Drown tomorrow and with the lung doctor as well. Please call us if you have any questions.   - Dr. Frederico Hamman  Face-to-face encounter (required for Medicare/Medicaid patients)   Complete by:  As directed    I Welford Roche certify that this patient is under my care and that I, or a nurse practitioner or physician's assistant working with me, had a face-to-face encounter that meets the physician face-to-face encounter requirements with this patient on 10/13/2017. The encounter with the patient was in whole, or in part for the following medical condition(s) which is the primary reason for home health care (List medical condition): Dyspnea on exertion and fatigue secondary to R lung mass   The encounter with the patient was in whole, or in part, for the following medical condition, which is the primary reason for home health care:  Dyspnea on exertion and fatigue secondary to R lung mass   I certify that, based on my findings, the following services are medically necessary home health services:  Physical therapy   Reason for Medically Necessary Home Health Services:  Other See Comments   My clinical findings support the need for the above services:   Shortness of breath with activity   Further, I certify that my clinical findings support that this patient is homebound due to:  Shortness of Breath with activity   Home Health   Complete by:  As directed    To provide the following care/treatments:  PT   Increase activity slowly   Complete by:  As directed       Signed: Welford Roche, MD 10/14/2017, 11:44 AM   Pager: [phone]

## 2017-10-13 NOTE — Telephone Encounter (Signed)
Please change the 15 minute appointment for [name] on 6/19 to a 30 minute hospital follow up. She was admitted the day after she missed her appointment with me. She is also having an EBUS Friday. Make sure she is aware so [facility] bus can make any changes in pick up times needed. Thanks

## 2017-10-14 ENCOUNTER — Ambulatory Visit: Payer: Medicare Other | Admitting: Acute Care

## 2017-10-14 ENCOUNTER — Ambulatory Visit: Payer: Medicare Other | Admitting: Internal Medicine

## 2017-10-14 ENCOUNTER — Other Ambulatory Visit: Payer: Self-pay

## 2017-10-14 ENCOUNTER — Telehealth: Payer: Self-pay | Admitting: Emergency Medicine

## 2017-10-14 ENCOUNTER — Encounter: Payer: Self-pay | Admitting: Internal Medicine

## 2017-10-14 ENCOUNTER — Telehealth: Payer: Self-pay | Admitting: *Deleted

## 2017-10-14 ENCOUNTER — Ambulatory Visit (INDEPENDENT_AMBULATORY_CARE_PROVIDER_SITE_OTHER): Payer: Medicare Other | Admitting: Internal Medicine

## 2017-10-14 DIAGNOSIS — I739 Peripheral vascular disease, unspecified: Secondary | ICD-10-CM | POA: Diagnosis not present

## 2017-10-14 DIAGNOSIS — J439 Emphysema, unspecified: Secondary | ICD-10-CM | POA: Diagnosis not present

## 2017-10-14 DIAGNOSIS — M19012 Primary osteoarthritis, left shoulder: Secondary | ICD-10-CM | POA: Diagnosis not present

## 2017-10-14 DIAGNOSIS — I129 Hypertensive chronic kidney disease with stage 1 through stage 4 chronic kidney disease, or unspecified chronic kidney disease: Secondary | ICD-10-CM | POA: Diagnosis not present

## 2017-10-14 DIAGNOSIS — Z7902 Long term (current) use of antithrombotics/antiplatelets: Secondary | ICD-10-CM | POA: Diagnosis not present

## 2017-10-14 DIAGNOSIS — N189 Chronic kidney disease, unspecified: Secondary | ICD-10-CM

## 2017-10-14 DIAGNOSIS — R918 Other nonspecific abnormal finding of lung field: Secondary | ICD-10-CM

## 2017-10-14 DIAGNOSIS — E785 Hyperlipidemia, unspecified: Secondary | ICD-10-CM | POA: Diagnosis not present

## 2017-10-14 DIAGNOSIS — N183 Chronic kidney disease, stage 3 (moderate): Secondary | ICD-10-CM | POA: Diagnosis not present

## 2017-10-14 DIAGNOSIS — D631 Anemia in chronic kidney disease: Secondary | ICD-10-CM | POA: Diagnosis not present

## 2017-10-14 DIAGNOSIS — Z9889 Other specified postprocedural states: Secondary | ICD-10-CM | POA: Diagnosis not present

## 2017-10-14 DIAGNOSIS — J449 Chronic obstructive pulmonary disease, unspecified: Secondary | ICD-10-CM

## 2017-10-14 DIAGNOSIS — I2510 Atherosclerotic heart disease of native coronary artery without angina pectoris: Secondary | ICD-10-CM | POA: Diagnosis not present

## 2017-10-14 DIAGNOSIS — I10 Essential (primary) hypertension: Secondary | ICD-10-CM

## 2017-10-14 DIAGNOSIS — J181 Lobar pneumonia, unspecified organism: Secondary | ICD-10-CM

## 2017-10-14 DIAGNOSIS — Z87891 Personal history of nicotine dependence: Secondary | ICD-10-CM | POA: Diagnosis not present

## 2017-10-14 DIAGNOSIS — Z86718 Personal history of other venous thrombosis and embolism: Secondary | ICD-10-CM | POA: Diagnosis not present

## 2017-10-14 DIAGNOSIS — H9190 Unspecified hearing loss, unspecified ear: Secondary | ICD-10-CM

## 2017-10-14 DIAGNOSIS — Z7982 Long term (current) use of aspirin: Secondary | ICD-10-CM | POA: Diagnosis not present

## 2017-10-14 DIAGNOSIS — J8410 Pulmonary fibrosis, unspecified: Secondary | ICD-10-CM | POA: Diagnosis not present

## 2017-10-14 DIAGNOSIS — H548 Legal blindness, as defined in USA: Secondary | ICD-10-CM

## 2017-10-14 DIAGNOSIS — I700 Atherosclerosis of aorta: Secondary | ICD-10-CM | POA: Diagnosis not present

## 2017-10-14 NOTE — Progress Notes (Signed)
   Subjective:    Patient ID: [name], female    DOB: 02-03-1941, 77 y.o.   MRN: [id number]  CC: hospital follow up for lung mass.  HPI  [name] is a 77yo woman with loss of vision and hearing, CKD, COPD, HTN who presents after being seen in the hospital for fatigue and presumed symptomatic anemia.  Her Hgb was initially shown to be 4, but on recheck was likely closer to 8 which has been chronically low for the last few months (previously normal in 2018).  She was found to have a lung mass and possibly a malignancy to explain her drop in Hgb to the 8's.  She did receive blood products and improved to around 12 for her Hgb.  Today, she reports no blood loss.  She cannot see , so does not know if there is blood in her stool.  She reports a cough, but not bringing anything up and no hemoptysis.  She was given doxycycline on discharge, but has not taken because she feels it is a "bad medicine."  She was also not sure about clopidogrel, as she was not aware that it was the same as plavix.  She believes she has not been taking it since being discharged.  Her BP medications were also held on discharge and her BP is stable today.  She overall feels well, but is very concerned about her lung mass and an upcoming bronchoscopy.  This is scheduled for Friday.  She is not aware of where to go.  A note in chart reports that Dr. Lamonte Sakai has attempted to get in contact with her.    My nurse and I confirmed her appointment time for her so that she could get [facility] scheduled and we confirmed her phone number so that when the hospital and/or pulmonology calls, that she will receive the call.  We gave her information about where to go on Friday morning.  I stressed to her that it was very important that she go to this procedure.  I reviewed her CT scan findings with her and she understood them and appeared to have insight into her disease, however, this is hard to assess given her difficulty with hearing. I asked her  to continue to hold her Plavix until at least Monday.    Review of Systems  Constitutional: Negative for activity change, appetite change, chills, diaphoresis, fatigue and fever.  HENT: Negative for nosebleeds.   Respiratory: Positive for cough. Negative for choking, shortness of breath and stridor.        No hemoptysis  Neurological: Negative for dizziness, weakness and light-headedness.       Objective:   Physical Exam  Constitutional: She is oriented to person, place, and time. She appears well-developed and well-nourished. No distress.  HENT:  Head: Normocephalic and atraumatic.  She is blind with clouded corneas  Cardiovascular: Normal rate and regular rhythm.  Pulmonary/Chest: Effort normal. She has no wheezes. She exhibits no tenderness.  Decreased breath sounds in apices, worse on the right  Neurological: She is alert and oriented to person, place, and time.  Psychiatric:  Somewhat tearful.  Understood her medical condition to some extent.           Assessment & Plan:  RTC in 2-3 months, after work up/treatment of lung mass is complete.  Sooner if needed.

## 2017-10-14 NOTE — Assessment & Plan Note (Signed)
Due for bronchoscopy on Friday.  The majority of the visit was spent explaining this to [name] and making sure that she logistically had a way to get to the procedure and knew where to go.  She seemed to understand her diagnosis and the importance of the procedure.  I asked her to continue to hold plavix.  She is on it for PVD, last procedure > 1 year ago from what I can tell in the chart.  She will resume this once procedure completed, Monday, if this is considered safe from a pulmonary stand point.

## 2017-10-14 NOTE — Patient Instructions (Signed)
[name] - -  You are scheduled for a bronchoscopy on Friday June 7 at 1145am.  This is at [facility] should ask [facility] to drop you at the main entrance and you should report to the short stay/OR check in desk.   Please call the operating room scheduler or the pulmonary clinic to find out when you should be there.    Please stop your plavix (already stopped) and do not start back until Monday June 10 for your PVD.

## 2017-10-14 NOTE — Telephone Encounter (Signed)
HHN aimee calls to state she has called [facility] for transportation for pt on fri for bronc. Went over visit today and that pt has called dr byrum office for more discussion about procedure

## 2017-10-14 NOTE — Assessment & Plan Note (Signed)
BP well controlled today.  From what I can tell in the chart and patient report, she was stopped off of her medications at this time.  I advised her to continue to hold them given her current BP.

## 2017-10-14 NOTE — Assessment & Plan Note (Signed)
Bronchoscopy planned for Friday 6/7.

## 2017-10-14 NOTE — Telephone Encounter (Signed)
Attempted to call pt. I did not receive an answer. I have left a message for pt to return our call.  

## 2017-10-15 ENCOUNTER — Other Ambulatory Visit: Payer: Self-pay

## 2017-10-15 ENCOUNTER — Encounter (HOSPITAL_COMMUNITY): Payer: Self-pay | Admitting: *Deleted

## 2017-10-15 DIAGNOSIS — Z86718 Personal history of other venous thrombosis and embolism: Secondary | ICD-10-CM | POA: Diagnosis not present

## 2017-10-15 DIAGNOSIS — N183 Chronic kidney disease, stage 3 (moderate): Secondary | ICD-10-CM | POA: Diagnosis not present

## 2017-10-15 DIAGNOSIS — H548 Legal blindness, as defined in USA: Secondary | ICD-10-CM | POA: Diagnosis not present

## 2017-10-15 DIAGNOSIS — R918 Other nonspecific abnormal finding of lung field: Secondary | ICD-10-CM | POA: Diagnosis not present

## 2017-10-15 DIAGNOSIS — Z87891 Personal history of nicotine dependence: Secondary | ICD-10-CM | POA: Diagnosis not present

## 2017-10-15 DIAGNOSIS — Z7982 Long term (current) use of aspirin: Secondary | ICD-10-CM | POA: Diagnosis not present

## 2017-10-15 DIAGNOSIS — I2510 Atherosclerotic heart disease of native coronary artery without angina pectoris: Secondary | ICD-10-CM | POA: Diagnosis not present

## 2017-10-15 DIAGNOSIS — I739 Peripheral vascular disease, unspecified: Secondary | ICD-10-CM | POA: Diagnosis not present

## 2017-10-15 DIAGNOSIS — M19012 Primary osteoarthritis, left shoulder: Secondary | ICD-10-CM | POA: Diagnosis not present

## 2017-10-15 DIAGNOSIS — D631 Anemia in chronic kidney disease: Secondary | ICD-10-CM | POA: Diagnosis not present

## 2017-10-15 DIAGNOSIS — I700 Atherosclerosis of aorta: Secondary | ICD-10-CM | POA: Diagnosis not present

## 2017-10-15 DIAGNOSIS — I129 Hypertensive chronic kidney disease with stage 1 through stage 4 chronic kidney disease, or unspecified chronic kidney disease: Secondary | ICD-10-CM | POA: Diagnosis not present

## 2017-10-15 DIAGNOSIS — J8410 Pulmonary fibrosis, unspecified: Secondary | ICD-10-CM | POA: Diagnosis not present

## 2017-10-15 DIAGNOSIS — Z7902 Long term (current) use of antithrombotics/antiplatelets: Secondary | ICD-10-CM | POA: Diagnosis not present

## 2017-10-15 DIAGNOSIS — J439 Emphysema, unspecified: Secondary | ICD-10-CM | POA: Diagnosis not present

## 2017-10-15 DIAGNOSIS — E785 Hyperlipidemia, unspecified: Secondary | ICD-10-CM | POA: Diagnosis not present

## 2017-10-15 DIAGNOSIS — H9190 Unspecified hearing loss, unspecified ear: Secondary | ICD-10-CM | POA: Diagnosis not present

## 2017-10-15 NOTE — Telephone Encounter (Signed)
LMTCB 10/15/17

## 2017-10-15 NOTE — Progress Notes (Signed)
Spoke with pt for pre-op call. Pt denies cardiac history. She states she has hx of blood clots in the past. Is on Plavix to prevent clots. She was instructed to stop Plavix prior to surgery when she was in the hospital this past week. Last dose was 10/10/17. Pt states she is not diabetic.

## 2017-10-16 ENCOUNTER — Encounter (HOSPITAL_COMMUNITY): Payer: Self-pay | Admitting: Certified Registered Nurse Anesthetist

## 2017-10-16 ENCOUNTER — Other Ambulatory Visit: Payer: Self-pay

## 2017-10-16 ENCOUNTER — Ambulatory Visit (HOSPITAL_COMMUNITY): Payer: Medicare Other | Admitting: Certified Registered Nurse Anesthetist

## 2017-10-16 ENCOUNTER — Ambulatory Visit: Payer: Medicare Other | Admitting: Podiatry

## 2017-10-16 ENCOUNTER — Encounter (HOSPITAL_COMMUNITY)
Admission: RE | Disposition: A | Discharge status: Home / Self Care | Payer: Self-pay | Source: Ambulatory Visit | Attending: Emergency Medicine

## 2017-10-16 ENCOUNTER — Observation Stay (HOSPITAL_COMMUNITY)
Admission: RE | Admit: 2017-10-16 | Discharge: 2017-10-17 | Disposition: A | Discharge status: Home / Self Care | Payer: Medicare Other | Source: Ambulatory Visit | Attending: Emergency Medicine | Admitting: Emergency Medicine

## 2017-10-16 DIAGNOSIS — R918 Other nonspecific abnormal finding of lung field: Secondary | ICD-10-CM | POA: Diagnosis present

## 2017-10-16 DIAGNOSIS — I2510 Atherosclerotic heart disease of native coronary artery without angina pectoris: Secondary | ICD-10-CM | POA: Diagnosis not present

## 2017-10-16 DIAGNOSIS — C771 Secondary and unspecified malignant neoplasm of intrathoracic lymph nodes: Secondary | ICD-10-CM | POA: Diagnosis not present

## 2017-10-16 DIAGNOSIS — I739 Peripheral vascular disease, unspecified: Secondary | ICD-10-CM | POA: Insufficient documentation

## 2017-10-16 DIAGNOSIS — J449 Chronic obstructive pulmonary disease, unspecified: Secondary | ICD-10-CM | POA: Diagnosis not present

## 2017-10-16 DIAGNOSIS — N183 Chronic kidney disease, stage 3 (moderate): Secondary | ICD-10-CM | POA: Diagnosis not present

## 2017-10-16 DIAGNOSIS — J181 Lobar pneumonia, unspecified organism: Secondary | ICD-10-CM

## 2017-10-16 DIAGNOSIS — I129 Hypertensive chronic kidney disease with stage 1 through stage 4 chronic kidney disease, or unspecified chronic kidney disease: Secondary | ICD-10-CM | POA: Diagnosis not present

## 2017-10-16 DIAGNOSIS — Z79899 Other long term (current) drug therapy: Secondary | ICD-10-CM | POA: Diagnosis not present

## 2017-10-16 DIAGNOSIS — J189 Pneumonia, unspecified organism: Secondary | ICD-10-CM | POA: Diagnosis present

## 2017-10-16 DIAGNOSIS — Z7902 Long term (current) use of antithrombotics/antiplatelets: Secondary | ICD-10-CM | POA: Insufficient documentation

## 2017-10-16 DIAGNOSIS — Z809 Family history of malignant neoplasm, unspecified: Secondary | ICD-10-CM | POA: Diagnosis not present

## 2017-10-16 DIAGNOSIS — Z86718 Personal history of other venous thrombosis and embolism: Secondary | ICD-10-CM | POA: Insufficient documentation

## 2017-10-16 DIAGNOSIS — Z87891 Personal history of nicotine dependence: Secondary | ICD-10-CM | POA: Diagnosis not present

## 2017-10-16 DIAGNOSIS — R590 Localized enlarged lymph nodes: Secondary | ICD-10-CM | POA: Diagnosis present

## 2017-10-16 DIAGNOSIS — C3411 Malignant neoplasm of upper lobe, right bronchus or lung: Principal | ICD-10-CM | POA: Insufficient documentation

## 2017-10-16 DIAGNOSIS — I10 Essential (primary) hypertension: Secondary | ICD-10-CM | POA: Diagnosis not present

## 2017-10-16 HISTORY — PX: VIDEO BRONCHOSCOPY WITH ENDOBRONCHIAL ULTRASOUND: SHX6177

## 2017-10-16 HISTORY — DX: Anemia, unspecified: D64.9

## 2017-10-16 SURGERY — BRONCHOSCOPY, WITH EBUS
Anesthesia: General

## 2017-10-16 MED ORDER — ROCURONIUM BROMIDE 10 MG/ML (PF) SYRINGE
PREFILLED_SYRINGE | INTRAVENOUS | Status: AC
  Filled 2017-10-16: qty 5

## 2017-10-16 MED ORDER — ROCURONIUM BROMIDE 50 MG/5ML IV SOSY
PREFILLED_SYRINGE | INTRAVENOUS | Status: DC | PRN
  Administered 2017-10-16: 20 mg via INTRAVENOUS
  Administered 2017-10-16: 30 mg via INTRAVENOUS

## 2017-10-16 MED ORDER — ONDANSETRON HCL 4 MG/2ML IJ SOLN
INTRAMUSCULAR | Status: AC
  Filled 2017-10-16: qty 2

## 2017-10-16 MED ORDER — PHENYLEPHRINE 40 MCG/ML (10ML) SYRINGE FOR IV PUSH (FOR BLOOD PRESSURE SUPPORT)
PREFILLED_SYRINGE | INTRAVENOUS | Status: DC | PRN
  Administered 2017-10-16: 80 ug via INTRAVENOUS

## 2017-10-16 MED ORDER — 0.9 % SODIUM CHLORIDE (POUR BTL) OPTIME
TOPICAL | Status: DC | PRN
  Administered 2017-10-16: 1000 mL

## 2017-10-16 MED ORDER — FENTANYL CITRATE (PF) 100 MCG/2ML IJ SOLN
INTRAMUSCULAR | Status: DC | PRN
  Administered 2017-10-16: 50 ug via INTRAVENOUS

## 2017-10-16 MED ORDER — DEXAMETHASONE SODIUM PHOSPHATE 10 MG/ML IJ SOLN
INTRAMUSCULAR | Status: AC
  Filled 2017-10-16: qty 1

## 2017-10-16 MED ORDER — LIDOCAINE 2% (20 MG/ML) 5 ML SYRINGE
INTRAMUSCULAR | Status: AC
  Filled 2017-10-16: qty 5

## 2017-10-16 MED ORDER — EPHEDRINE SULFATE 50 MG/ML IJ SOLN
INTRAMUSCULAR | Status: AC
Start: 2017-10-16 — End: ?
  Filled 2017-10-16: qty 1

## 2017-10-16 MED ORDER — SODIUM CHLORIDE 0.9 % IV SOLN: 250.0000 mL | INTRAVENOUS | Status: DC | PRN

## 2017-10-16 MED ORDER — SUGAMMADEX SODIUM 200 MG/2ML IV SOLN
INTRAVENOUS | Status: AC
  Filled 2017-10-16: qty 2

## 2017-10-16 MED ORDER — ALBUTEROL SULFATE (2.5 MG/3ML) 0.083% IN NEBU: 3.0000 mL | INHALATION_SOLUTION | Freq: Four times a day (QID) | RESPIRATORY_TRACT | Status: DC | PRN

## 2017-10-16 MED ORDER — PROPOFOL 10 MG/ML IV BOLUS
INTRAVENOUS | Status: DC | PRN
  Administered 2017-10-16: 100 mg via INTRAVENOUS

## 2017-10-16 MED ORDER — CLOPIDOGREL BISULFATE 75 MG PO TABS: 75.0000 mg | ORAL_TABLET | Freq: Every day | ORAL | 12 refills | Status: AC

## 2017-10-16 MED ORDER — FENTANYL CITRATE (PF) 100 MCG/2ML IJ SOLN: 25.0000 ug | INTRAMUSCULAR | Status: DC | PRN

## 2017-10-16 MED ORDER — PHENYLEPHRINE 40 MCG/ML (10ML) SYRINGE FOR IV PUSH (FOR BLOOD PRESSURE SUPPORT)
PREFILLED_SYRINGE | INTRAVENOUS | Status: AC
  Filled 2017-10-16: qty 10

## 2017-10-16 MED ORDER — ONDANSETRON HCL 4 MG/2ML IJ SOLN
INTRAMUSCULAR | Status: DC | PRN
  Administered 2017-10-16: 4 mg via INTRAVENOUS

## 2017-10-16 MED ORDER — SUGAMMADEX SODIUM 200 MG/2ML IV SOLN
INTRAVENOUS | Status: DC | PRN
  Administered 2017-10-16: 200 mg via INTRAVENOUS

## 2017-10-16 MED ORDER — LACTATED RINGERS IV SOLN
INTRAVENOUS | Status: DC | PRN
  Administered 2017-10-16: 10:00:00 via INTRAVENOUS

## 2017-10-16 MED ORDER — DEXAMETHASONE SODIUM PHOSPHATE 10 MG/ML IJ SOLN
INTRAMUSCULAR | Status: DC | PRN
  Administered 2017-10-16: 10 mg via INTRAVENOUS

## 2017-10-16 MED ORDER — FENTANYL CITRATE (PF) 250 MCG/5ML IJ SOLN
INTRAMUSCULAR | Status: AC
  Filled 2017-10-16: qty 5

## 2017-10-16 MED ORDER — SODIUM CHLORIDE 0.9% FLUSH
3.0000 mL | Freq: Two times a day (BID) | INTRAVENOUS | Status: DC
  Administered 2017-10-16 – 2017-10-17 (×3): 3 mL via INTRAVENOUS

## 2017-10-16 MED ORDER — LIDOCAINE 2% (20 MG/ML) 5 ML SYRINGE
INTRAMUSCULAR | Status: DC | PRN
  Administered 2017-10-16: 50 mg via INTRAVENOUS

## 2017-10-16 MED ORDER — SODIUM CHLORIDE 0.9% FLUSH
3.0000 mL | INTRAVENOUS | Status: DC | PRN
Start: 2017-10-16 — End: 2017-10-17

## 2017-10-16 MED ORDER — EPHEDRINE SULFATE-NACL 50-0.9 MG/10ML-% IV SOSY
PREFILLED_SYRINGE | INTRAVENOUS | Status: DC | PRN
  Administered 2017-10-16: 10 mg via INTRAVENOUS

## 2017-10-16 MED ORDER — ATORVASTATIN CALCIUM 40 MG PO TABS
40.0000 mg | ORAL_TABLET | Freq: Every day | ORAL | Status: DC
  Administered 2017-10-16 – 2017-10-17 (×2): 40 mg via ORAL
  Filled 2017-10-16 (×2): qty 1

## 2017-10-16 SURGICAL SUPPLY — 34 items
BRUSH CYTOL CELLEBRITY 1.5X140 (MISCELLANEOUS) IMPLANT
CANISTER SUCT 3000ML PPV (MISCELLANEOUS) ×3 IMPLANT
CONT SPEC 4OZ CLIKSEAL STRL BL (MISCELLANEOUS) ×5 IMPLANT
COVER BACK TABLE 60X90IN (DRAPES) ×3 IMPLANT
COVER DOME SNAP 22 D (MISCELLANEOUS) ×5 IMPLANT
FORCEPS BIOP RJ4 1.8 (CUTTING FORCEPS) IMPLANT
GAUZE SPONGE 4X4 12PLY STRL (GAUZE/BANDAGES/DRESSINGS) ×3 IMPLANT
GLOVE BIO SURGEON STRL SZ7.5 (GLOVE) ×3 IMPLANT
GOWN STRL REUS W/ TWL LRG LVL3 (GOWN DISPOSABLE) ×1 IMPLANT
GOWN STRL REUS W/ TWL XL LVL3 (GOWN DISPOSABLE) IMPLANT
GOWN STRL REUS W/TWL LRG LVL3 (GOWN DISPOSABLE) ×3
GOWN STRL REUS W/TWL XL LVL3 (GOWN DISPOSABLE) ×3
KIT CLEAN ENDO COMPLIANCE (KITS) ×6 IMPLANT
KIT TURNOVER KIT B (KITS) ×3 IMPLANT
MARKER SKIN DUAL TIP RULER LAB (MISCELLANEOUS) ×3 IMPLANT
NDL EBUS SONO TIP PENTAX (NEEDLE) ×1 IMPLANT
NEEDLE EBUS SONO TIP PENTAX (NEEDLE) ×6 IMPLANT
NS IRRIG 1000ML POUR BTL (IV SOLUTION) ×3 IMPLANT
OIL SILICONE PENTAX (PARTS (SERVICE/REPAIRS)) ×3 IMPLANT
PAD ARMBOARD 7.5X6 YLW CONV (MISCELLANEOUS) ×6 IMPLANT
SYR 20CC LL (SYRINGE) ×6 IMPLANT
SYR 20ML ECCENTRIC (SYRINGE) ×8 IMPLANT
SYR 50ML LL SCALE MARK (SYRINGE) ×2 IMPLANT
SYR 50ML SLIP (SYRINGE) IMPLANT
SYR 5ML LUER SLIP (SYRINGE) ×3 IMPLANT
TOWEL OR 17X24 6PK STRL BLUE (TOWEL DISPOSABLE) ×3 IMPLANT
TRAP SPECIMEN MUCOUS 40CC (MISCELLANEOUS) ×4 IMPLANT
TUBE CONNECTING 12'X1/4 (SUCTIONS) ×2
TUBE CONNECTING 12X1/4 (SUCTIONS) ×2 IMPLANT
TUBE CONNECTING 20'X1/4 (TUBING) ×2
TUBE CONNECTING 20X1/4 (TUBING) ×4 IMPLANT
UNDERPAD 30X30 (UNDERPADS AND DIAPERS) ×3 IMPLANT
VALVE DISPOSABLE (MISCELLANEOUS) ×5 IMPLANT
WATER STERILE IRR 1000ML POUR (IV SOLUTION) ×3 IMPLANT

## 2017-10-16 NOTE — Telephone Encounter (Signed)
Called and spoke to pt. Pt stated she is getting ready to head to the hospital for Bronch. Pt denied having any questions regarding bronch. Nothing further is needed.

## 2017-10-16 NOTE — Op Note (Signed)
Video Bronchoscopy with Endobronchial Ultrasound Procedure Note  Date of Operation: 10/16/2017  Pre-op Diagnosis: Right upper lobe mass, mediastinal lymphadenopathy  Post-op Diagnosis: Same, suspected non-small cell lung cancer  Surgeon: Baltazar Apo  Assistants: None  Anesthesia: General endotracheal anesthesia  Operation: Flexible video fiberoptic bronchoscopy with endobronchial ultrasound and biopsies.  Estimated Blood Loss: Minimal  Complications: None apparent  Indications and History: [name] is a 77 y.o. female with history of tobacco use.  She was admitted with a right upper lobe pneumonia and posterior segmental consolidation, concern for possible coexisting right upper lobe mass.  She also had large precarinal and mediastinal lymphadenopathy.  A PET scan had been performed as an outpatient prior to her admission to characterize the lesions and showed hypermetabolic activity in each of these areas.  Recommendation was made to achieve a tissue diagnosis via bronchoscopy with endobronchial ultrasound and biopsies.  The risks, benefits, complications, treatment options and expected outcomes were discussed with the patient.  The possibilities of pneumothorax, pneumonia, reaction to medication, pulmonary aspiration, perforation of a viscus, bleeding, failure to diagnose a condition and creating a complication requiring transfusion or operation were discussed with the patient who freely signed the consent.    Description of Procedure: The patient was examined in the preoperative area and history and data from the preprocedure consultation were reviewed. It was deemed appropriate to proceed.  The patient was taken to OR 10, identified as [name] and the procedure verified as Flexible Video Fiberoptic Bronchoscopy.  A Time Out was held and the above information confirmed. After being taken to the operating room general anesthesia was initiated and the patient  was orally  intubated. The video fiberoptic bronchoscope was introduced via the endotracheal tube and a general inspection was performed which showed scattered thin secretions which were easily suctioned.  There were no obvious endobronchial lesions but her right upper lobe segmental airways were edematous, swollen with a widened splayed carina. The standard scope was then withdrawn and the endobronchial ultrasound was used to identify and characterize the peritracheal, hilar and bronchial lymph nodes. Inspection showed significant enlargement at 4R, 10 R. Using real-time ultrasound guidance Wang needle biopsies were take from [place] and 10 R nodes and were sent for cytology.  A right upper lobe bronchoalveolar lavage from the posterior segment was then performed and sent for cytology, microbiology.  The patient tolerated the procedure well without apparent complications. There was no significant blood loss. The bronchoscope was withdrawn. Anesthesia was reversed and the patient was taken to the PACU for recovery.   Samples: 1. Wang needle biopsies from 4R node 2. Wang needle biopsies from 10R node 3. Bronchoalveolar lavage from the posterior segment of the right upper lobe  Plans:  The patient will be discharged from the PACU to home when recovered from anesthesia. We will review the cytology, pathology and microbiology results with the patient when they become available. Outpatient followup will be with Dr Lamonte Sakai.    Baltazar Apo, MD, PhD 10/16/2017, 1:30 PM [facility] [phone] or if no answer [phone]

## 2017-10-16 NOTE — Discharge Instructions (Signed)
Flexible Bronchoscopy, Care After These instructions give you information on caring for yourself after your procedure. Your doctor may also give you more specific instructions. Call your doctor if you have any problems or questions after your procedure. Follow these instructions at home:  Do not eat or drink anything for 2 hours after your procedure. If you try to eat or drink before the medicine wears off, food or drink could go into your lungs. You could also burn yourself.  After 2 hours have passed and when you can cough and gag normally, you may eat soft food and drink liquids slowly.  The day after the test, you may eat your normal diet.  You may do your normal activities.  Keep all doctor visits. Get help right away if:  You get more and more short of breath.  You get light-headed.  You feel like you are going to pass out (faint).  You have chest pain.  You have new problems that worry you.  You cough up more than a little blood.  You cough up more blood than before.  Please call our office for any questions or concerns.  [phone].   This information is not intended to replace advice given to you by your health care provider. Make sure you discuss any questions you have with your health care provider. Document Released: 02/23/2009 Document Revised: 10/04/2015 Document Reviewed: 12/31/2012 Elsevier Interactive Patient Education  2017 [employer].

## 2017-10-16 NOTE — Anesthesia Preprocedure Evaluation (Signed)
Anesthesia Evaluation  Patient identified by MRN, date of birth, ID band Patient awake    Reviewed: Allergy & Precautions, NPO status , Patient's Chart, lab work & pertinent test results  Airway Mallampati: II  TM Distance: >3 FB Neck ROM: Full    Dental  (+) Dental Advisory Given   Pulmonary COPD, former smoker,    breath sounds clear to auscultation       Cardiovascular hypertension, + Peripheral Vascular Disease and + DVT   Rhythm:Regular Rate:Normal     Neuro/Psych negative neurological ROS     GI/Hepatic negative GI ROS, Neg liver ROS,   Endo/Other  negative endocrine ROS  Renal/GU CRFRenal disease     Musculoskeletal  (+) Arthritis ,   Abdominal   Peds  Hematology  (+) anemia ,   Anesthesia Other Findings   Reproductive/Obstetrics                             Lab Results  Component Value Date   WBC 12.1 (H) 10/13/2017   HGB 11.4 (L) 10/13/2017   HCT 34.5 (L) 10/13/2017   MCV 86.7 10/13/2017   PLT 364 10/13/2017   Lab Results  Component Value Date   CREATININE 1.46 (H) 10/12/2017   BUN 16 10/12/2017   NA 138 10/12/2017   K 3.8 10/12/2017   CL 103 10/12/2017   CO2 23 10/12/2017    Anesthesia Physical Anesthesia Plan  ASA: III  Anesthesia Plan: General   Post-op Pain Management:    Induction: Intravenous  PONV Risk Score and Plan: 3 and Dexamethasone, Ondansetron and Treatment may vary due to age or medical condition  Airway Management Planned: Oral ETT  Additional Equipment:   Intra-op Plan:   Post-operative Plan: Extubation in OR  Informed Consent: I have reviewed the patients History and Physical, chart, labs and discussed the procedure including the risks, benefits and alternatives for the proposed anesthesia with the patient or authorized representative who has indicated his/her understanding and acceptance.     Plan Discussed with:   Anesthesia  Plan Comments:         Anesthesia Quick Evaluation

## 2017-10-16 NOTE — Anesthesia Procedure Notes (Signed)
Procedure Name: Intubation Date/Time: 10/16/2017 12:23 PM Performed by: Genelle Bal, CRNA Pre-anesthesia Checklist: Patient identified, Emergency Drugs available, Suction available and Patient being monitored Patient Re-evaluated:Patient Re-evaluated prior to induction Oxygen Delivery Method: Circle system utilized Preoxygenation: Pre-oxygenation with 100% oxygen Induction Type: IV induction Ventilation: Mask ventilation without difficulty Laryngoscope Size: Miller and 2 Grade View: Grade I Tube type: Oral Tube size: 8.5 mm Number of attempts: 1 Airway Equipment and Method: Stylet Placement Confirmation: ETT inserted through vocal cords under direct vision,  positive ETCO2 and breath sounds checked- equal and bilateral Secured at: 21 cm Tube secured with: Tape Dental Injury: Teeth and Oropharynx as per pre-operative assessment

## 2017-10-16 NOTE — Transfer of Care (Signed)
Immediate Anesthesia Transfer of Care Note  Patient: PIEDAD STANDIFORD  Procedure(s) Performed: VIDEO BRONCHOSCOPY WITH ENDOBRONCHIAL ULTRASOUND (N/A )  Patient Location: PACU  Anesthesia Type:General  Level of Consciousness: awake, alert  and oriented  Airway & Oxygen Therapy: Patient Spontanous Breathing and Patient connected to face mask oxygen  Post-op Assessment: Report given to RN and Post -op Vital signs reviewed and stable  Post vital signs: Reviewed and stable  Last Vitals:  Vitals Value Taken Time  BP 126/69 10/16/2017  1:36 PM  Temp 36.4 C 10/16/2017  1:36 PM  Pulse 84 10/16/2017  1:39 PM  Resp 18 10/16/2017  1:39 PM  SpO2 94 % 10/16/2017  1:39 PM  Vitals shown include unvalidated device data.  Last Pain:  Vitals:   10/16/17 1336  TempSrc:   PainSc: 0-No pain         Complications: No apparent anesthesia complications

## 2017-10-16 NOTE — Interval H&P Note (Signed)
PCCM Interval Note  77 year old woman with COPD, presents today for further evaluation of her right upper lobe consolidation, suspected right upper lobe mass with right hilar involvement and mediastinal lymphadenopathy.  She presents today without any clinical changes.  She did get here on [employer] bus, and I am concerned about her transportation home. We will need to speak with her son or other family. I attempted to call him earlier this week but did not get him. May need SW help or CM help to insure that we get her home safely. I do not want to have to admit her for observation but I suppose this is a last resort plan.   Discussed the procedure in detail, she understands the risks and benefits, agrees to proceed. No barriers identified to procedure.   Plan for FOB + EBUS under general anesthesia. Consider RUL posterior segmental TBBx if the nodal biopsies are unrevealing.   Baltazar Apo, MD, PhD 10/16/2017, 12:03 PM [facility] [phone] or if no answer [phone]

## 2017-10-17 DIAGNOSIS — Z7902 Long term (current) use of antithrombotics/antiplatelets: Secondary | ICD-10-CM | POA: Diagnosis not present

## 2017-10-17 DIAGNOSIS — I739 Peripheral vascular disease, unspecified: Secondary | ICD-10-CM | POA: Diagnosis not present

## 2017-10-17 DIAGNOSIS — J449 Chronic obstructive pulmonary disease, unspecified: Secondary | ICD-10-CM | POA: Diagnosis not present

## 2017-10-17 DIAGNOSIS — Z79899 Other long term (current) drug therapy: Secondary | ICD-10-CM | POA: Diagnosis not present

## 2017-10-17 DIAGNOSIS — I2510 Atherosclerotic heart disease of native coronary artery without angina pectoris: Secondary | ICD-10-CM | POA: Diagnosis not present

## 2017-10-17 DIAGNOSIS — R590 Localized enlarged lymph nodes: Secondary | ICD-10-CM | POA: Diagnosis not present

## 2017-10-17 DIAGNOSIS — R918 Other nonspecific abnormal finding of lung field: Secondary | ICD-10-CM | POA: Diagnosis not present

## 2017-10-17 DIAGNOSIS — Z87891 Personal history of nicotine dependence: Secondary | ICD-10-CM | POA: Diagnosis not present

## 2017-10-17 DIAGNOSIS — I129 Hypertensive chronic kidney disease with stage 1 through stage 4 chronic kidney disease, or unspecified chronic kidney disease: Secondary | ICD-10-CM | POA: Diagnosis not present

## 2017-10-17 DIAGNOSIS — C771 Secondary and unspecified malignant neoplasm of intrathoracic lymph nodes: Secondary | ICD-10-CM | POA: Diagnosis not present

## 2017-10-17 DIAGNOSIS — N183 Chronic kidney disease, stage 3 (moderate): Secondary | ICD-10-CM | POA: Diagnosis not present

## 2017-10-17 DIAGNOSIS — Z809 Family history of malignant neoplasm, unspecified: Secondary | ICD-10-CM | POA: Diagnosis not present

## 2017-10-17 DIAGNOSIS — Z86718 Personal history of other venous thrombosis and embolism: Secondary | ICD-10-CM | POA: Diagnosis not present

## 2017-10-17 DIAGNOSIS — C3411 Malignant neoplasm of upper lobe, right bronchus or lung: Secondary | ICD-10-CM | POA: Diagnosis not present

## 2017-10-17 LAB — ACID FAST SMEAR (AFB)

## 2017-10-17 LAB — ACID FAST SMEAR (AFB, MYCOBACTERIA): Acid Fast Smear: NEGATIVE

## 2017-10-17 NOTE — Discharge Summary (Signed)
Physician Discharge Summary  Patient ID: [name] MRN: [id number] DOB/AGE: [birth year] 77 y.o.  Admit date: 10/16/2017 Discharge date: 10/17/2017  Admission Diagnoses: RUL Mass  Discharge Diagnoses:  Principal Problem:   Mass of upper lobe of right lung Active Problems:   Right upper lobe consolidation (HCC)   Mediastinal lymphadenopathy   Lung mass   Discharged Condition: good  Hospital Course: 77 year old woman with history of tobacco use and COPD who has been followed in our office for a right upper lobe consolidation and suspected mass, large mediastinal lymphadenopathy.  She has undergone serial CT scans and a PET scan that confirmed hyper metabolism in the right upper lobe and her lymph nodes.  She was recently admitted for a right upper lobe postobstructive pneumonia and treated.  We arranged at that time for her to return for bronchoscopy with endobronchial ultrasound to facilitate biopsies and a tissue diagnosis.  This was done on 10/16/2017 without incident.  She tolerated well.  The initial biopsies of 4R lymph nodes were suspicious for malignancy, final pathology pending.  She was admitted for observation post procedure. She was well on 10/17/2017.  She reported a small amount of blood-tinged sputum, no chest pain, no dyspnea.    Plan for discharged home on 10/17/2017.  She will be called with her bronchoscopy results next week.  She has a follow-up visit in our office with Eric Form, NP.   Consults: None  Significant Diagnostic Studies: bronchoscopy: With endobronchial ultrasound performed on 10/16/2017.  Please refer also to procedure note.  Transbronchial needle biopsies performed at lymph nodes 4R, 10 R.  Results pending  Treatments: IV hydration  Discharge Exam: Blood pressure 138/73, pulse 64, temperature (!) 97.5 F (36.4 C), temperature source Axillary, resp. rate 17, weight 59 kg (130 lb), SpO2 100 %. General appearance: alert, cooperative and appears stated  age Head: Normocephalic, without obvious abnormality, atraumatic Resp: wheezes anterior - right Chest wall: no tenderness Cardio: regular rate and rhythm, S1, S2 normal, no murmur, click, rub or gallop GI: soft, non-tender; bowel sounds normal; no masses,  no organomegaly Extremities: edema Trace pedal edema  Disposition: Discharge disposition: 01-Home or Self Care       Discharge Instructions    Discharge instructions   Complete by:  As directed    Please call our office for any problems or questions. [phone].  You will be called next week with bronchoscopy results.   Discharge patient   Complete by:  As directed    Discharge disposition:  01-Home or Self Care   Discharge patient date:  10/16/2017     Allergies as of 10/17/2017      Reactions   Codeine Other (See Comments)   REACTION: Pt c/o "feeling high"   Morphine Other (See Comments)   REACTION: feels funny and "high"      Medication List    TAKE these medications   AEROCHAMBER MV inhaler Use as instructed   albuterol 108 (90 Base) MCG/ACT inhaler Commonly known as:  PROVENTIL HFA;VENTOLIN HFA Inhale 2 puffs into the lungs every 6 (six) hours as needed for wheezing or shortness of breath.   atorvastatin 40 MG tablet Commonly known as:  LIPITOR Take 1 tablet (40 mg total) by mouth daily.   clopidogrel 75 MG tablet Commonly known as:  PLAVIX Take 1 tablet (75 mg total) by mouth daily with breakfast. Okay to restart this medication on Sunday 10/18/2017. What changed:  additional instructions      Follow-up Information  Magdalen Spatz, NP On 10/28/2017.   Specialty:  Pulmonary Disease Why:  4:00 pm Contact information: [address] [place] [address] [phone]           Signed: Collene Gobble 10/17/2017, 7:28 AM

## 2017-10-17 NOTE — Discharge Planning (Signed)
Patient IV removed. RN assessment and VS revealed stability for DC to home.  Informed of suggested FU appt and appt made.  Discharge papers given, explained and educated.  Script printed and given.  Once ready, will be wheeled to front and family transporting home via car.

## 2017-10-18 LAB — CULTURE, RESPIRATORY W GRAM STAIN

## 2017-10-18 LAB — CULTURE, RESPIRATORY

## 2017-10-19 ENCOUNTER — Telehealth: Payer: Self-pay

## 2017-10-19 ENCOUNTER — Encounter (HOSPITAL_COMMUNITY): Payer: Self-pay | Admitting: Emergency Medicine

## 2017-10-19 NOTE — Telephone Encounter (Signed)
[name] with [facility] requesting VO for PT. Please call back.

## 2017-10-19 NOTE — Telephone Encounter (Signed)
Agree. Thanks

## 2017-10-19 NOTE — Anesthesia Postprocedure Evaluation (Signed)
Anesthesia Post Note  Patient: [name]  Procedure(s) Performed: VIDEO BRONCHOSCOPY WITH ENDOBRONCHIAL ULTRASOUND (N/A )     Patient location during evaluation: PACU Anesthesia Type: General Level of consciousness: awake and alert Pain management: pain level controlled Vital Signs Assessment: post-procedure vital signs reviewed and stable Respiratory status: spontaneous breathing, nonlabored ventilation, respiratory function stable and patient connected to nasal cannula oxygen Cardiovascular status: blood pressure returned to baseline and stable Postop Assessment: no apparent nausea or vomiting Anesthetic complications: no    Last Vitals:  Vitals:   10/16/17 2147 10/17/17 0503  BP: (!) 115/56 138/73  Pulse: 96 64  Resp: 16 17  Temp: 37 C (!) 36.4 C  SpO2: 100% 100%    Last Pain:  Vitals:   10/17/17 0833  TempSrc:   PainSc: 0-No pain                 Tiajuana Amass

## 2017-10-19 NOTE — Telephone Encounter (Signed)
1x week for 3 weeks for balance, safety and ambulation, strengthening. Do you agree?

## 2017-10-20 DIAGNOSIS — R918 Other nonspecific abnormal finding of lung field: Secondary | ICD-10-CM | POA: Diagnosis not present

## 2017-10-20 DIAGNOSIS — I700 Atherosclerosis of aorta: Secondary | ICD-10-CM | POA: Diagnosis not present

## 2017-10-20 DIAGNOSIS — M19012 Primary osteoarthritis, left shoulder: Secondary | ICD-10-CM | POA: Diagnosis not present

## 2017-10-20 DIAGNOSIS — I129 Hypertensive chronic kidney disease with stage 1 through stage 4 chronic kidney disease, or unspecified chronic kidney disease: Secondary | ICD-10-CM | POA: Diagnosis not present

## 2017-10-20 DIAGNOSIS — Z86718 Personal history of other venous thrombosis and embolism: Secondary | ICD-10-CM | POA: Diagnosis not present

## 2017-10-20 DIAGNOSIS — N183 Chronic kidney disease, stage 3 (moderate): Secondary | ICD-10-CM | POA: Diagnosis not present

## 2017-10-20 DIAGNOSIS — D631 Anemia in chronic kidney disease: Secondary | ICD-10-CM | POA: Diagnosis not present

## 2017-10-20 DIAGNOSIS — J439 Emphysema, unspecified: Secondary | ICD-10-CM | POA: Diagnosis not present

## 2017-10-20 DIAGNOSIS — Z7902 Long term (current) use of antithrombotics/antiplatelets: Secondary | ICD-10-CM | POA: Diagnosis not present

## 2017-10-20 DIAGNOSIS — I2510 Atherosclerotic heart disease of native coronary artery without angina pectoris: Secondary | ICD-10-CM | POA: Diagnosis not present

## 2017-10-20 DIAGNOSIS — I739 Peripheral vascular disease, unspecified: Secondary | ICD-10-CM | POA: Diagnosis not present

## 2017-10-20 DIAGNOSIS — H9190 Unspecified hearing loss, unspecified ear: Secondary | ICD-10-CM | POA: Diagnosis not present

## 2017-10-20 DIAGNOSIS — Z87891 Personal history of nicotine dependence: Secondary | ICD-10-CM | POA: Diagnosis not present

## 2017-10-20 DIAGNOSIS — J8410 Pulmonary fibrosis, unspecified: Secondary | ICD-10-CM | POA: Diagnosis not present

## 2017-10-20 DIAGNOSIS — Z7982 Long term (current) use of aspirin: Secondary | ICD-10-CM | POA: Diagnosis not present

## 2017-10-20 DIAGNOSIS — E785 Hyperlipidemia, unspecified: Secondary | ICD-10-CM | POA: Diagnosis not present

## 2017-10-20 DIAGNOSIS — H548 Legal blindness, as defined in USA: Secondary | ICD-10-CM | POA: Diagnosis not present

## 2017-10-21 LAB — ANAEROBIC CULTURE

## 2017-10-22 ENCOUNTER — Ambulatory Visit: Payer: Medicare Other | Admitting: Acute Care

## 2017-10-23 DIAGNOSIS — I739 Peripheral vascular disease, unspecified: Secondary | ICD-10-CM | POA: Diagnosis not present

## 2017-10-23 DIAGNOSIS — I129 Hypertensive chronic kidney disease with stage 1 through stage 4 chronic kidney disease, or unspecified chronic kidney disease: Secondary | ICD-10-CM | POA: Diagnosis not present

## 2017-10-23 DIAGNOSIS — R918 Other nonspecific abnormal finding of lung field: Secondary | ICD-10-CM | POA: Diagnosis not present

## 2017-10-23 DIAGNOSIS — Z86718 Personal history of other venous thrombosis and embolism: Secondary | ICD-10-CM | POA: Diagnosis not present

## 2017-10-23 DIAGNOSIS — E785 Hyperlipidemia, unspecified: Secondary | ICD-10-CM | POA: Diagnosis not present

## 2017-10-23 DIAGNOSIS — H9190 Unspecified hearing loss, unspecified ear: Secondary | ICD-10-CM | POA: Diagnosis not present

## 2017-10-23 DIAGNOSIS — H548 Legal blindness, as defined in USA: Secondary | ICD-10-CM | POA: Diagnosis not present

## 2017-10-23 DIAGNOSIS — Z87891 Personal history of nicotine dependence: Secondary | ICD-10-CM | POA: Diagnosis not present

## 2017-10-23 DIAGNOSIS — M19012 Primary osteoarthritis, left shoulder: Secondary | ICD-10-CM | POA: Diagnosis not present

## 2017-10-23 DIAGNOSIS — N183 Chronic kidney disease, stage 3 (moderate): Secondary | ICD-10-CM | POA: Diagnosis not present

## 2017-10-23 DIAGNOSIS — Z7982 Long term (current) use of aspirin: Secondary | ICD-10-CM | POA: Diagnosis not present

## 2017-10-23 DIAGNOSIS — D631 Anemia in chronic kidney disease: Secondary | ICD-10-CM | POA: Diagnosis not present

## 2017-10-23 DIAGNOSIS — J8410 Pulmonary fibrosis, unspecified: Secondary | ICD-10-CM | POA: Diagnosis not present

## 2017-10-23 DIAGNOSIS — J439 Emphysema, unspecified: Secondary | ICD-10-CM | POA: Diagnosis not present

## 2017-10-23 DIAGNOSIS — I2510 Atherosclerotic heart disease of native coronary artery without angina pectoris: Secondary | ICD-10-CM | POA: Diagnosis not present

## 2017-10-23 DIAGNOSIS — Z7902 Long term (current) use of antithrombotics/antiplatelets: Secondary | ICD-10-CM | POA: Diagnosis not present

## 2017-10-23 DIAGNOSIS — I700 Atherosclerosis of aorta: Secondary | ICD-10-CM | POA: Diagnosis not present

## 2017-10-26 DIAGNOSIS — J439 Emphysema, unspecified: Secondary | ICD-10-CM | POA: Diagnosis not present

## 2017-10-26 DIAGNOSIS — Z7982 Long term (current) use of aspirin: Secondary | ICD-10-CM | POA: Diagnosis not present

## 2017-10-26 DIAGNOSIS — I2510 Atherosclerotic heart disease of native coronary artery without angina pectoris: Secondary | ICD-10-CM | POA: Diagnosis not present

## 2017-10-26 DIAGNOSIS — N183 Chronic kidney disease, stage 3 (moderate): Secondary | ICD-10-CM | POA: Diagnosis not present

## 2017-10-26 DIAGNOSIS — I739 Peripheral vascular disease, unspecified: Secondary | ICD-10-CM | POA: Diagnosis not present

## 2017-10-26 DIAGNOSIS — Z87891 Personal history of nicotine dependence: Secondary | ICD-10-CM | POA: Diagnosis not present

## 2017-10-26 DIAGNOSIS — M19012 Primary osteoarthritis, left shoulder: Secondary | ICD-10-CM | POA: Diagnosis not present

## 2017-10-26 DIAGNOSIS — H548 Legal blindness, as defined in USA: Secondary | ICD-10-CM | POA: Diagnosis not present

## 2017-10-26 DIAGNOSIS — Z86718 Personal history of other venous thrombosis and embolism: Secondary | ICD-10-CM | POA: Diagnosis not present

## 2017-10-26 DIAGNOSIS — I129 Hypertensive chronic kidney disease with stage 1 through stage 4 chronic kidney disease, or unspecified chronic kidney disease: Secondary | ICD-10-CM | POA: Diagnosis not present

## 2017-10-26 DIAGNOSIS — J8410 Pulmonary fibrosis, unspecified: Secondary | ICD-10-CM | POA: Diagnosis not present

## 2017-10-26 DIAGNOSIS — E785 Hyperlipidemia, unspecified: Secondary | ICD-10-CM | POA: Diagnosis not present

## 2017-10-26 DIAGNOSIS — Z7902 Long term (current) use of antithrombotics/antiplatelets: Secondary | ICD-10-CM | POA: Diagnosis not present

## 2017-10-26 DIAGNOSIS — H9190 Unspecified hearing loss, unspecified ear: Secondary | ICD-10-CM | POA: Diagnosis not present

## 2017-10-26 DIAGNOSIS — I700 Atherosclerosis of aorta: Secondary | ICD-10-CM | POA: Diagnosis not present

## 2017-10-26 DIAGNOSIS — R918 Other nonspecific abnormal finding of lung field: Secondary | ICD-10-CM | POA: Diagnosis not present

## 2017-10-26 DIAGNOSIS — D631 Anemia in chronic kidney disease: Secondary | ICD-10-CM | POA: Diagnosis not present

## 2017-10-28 ENCOUNTER — Encounter: Payer: Self-pay | Admitting: Acute Care

## 2017-10-28 ENCOUNTER — Ambulatory Visit: Payer: Medicare Other | Admitting: Acute Care

## 2017-10-28 ENCOUNTER — Ambulatory Visit (INDEPENDENT_AMBULATORY_CARE_PROVIDER_SITE_OTHER): Payer: Medicare Other | Admitting: Acute Care

## 2017-10-28 VITALS — BP 132/64 | HR 102 | Ht 64.0 in | Wt 129.2 lb

## 2017-10-28 DIAGNOSIS — J449 Chronic obstructive pulmonary disease, unspecified: Secondary | ICD-10-CM

## 2017-10-28 DIAGNOSIS — F17210 Nicotine dependence, cigarettes, uncomplicated: Secondary | ICD-10-CM | POA: Diagnosis not present

## 2017-10-28 DIAGNOSIS — R918 Other nonspecific abnormal finding of lung field: Secondary | ICD-10-CM | POA: Diagnosis not present

## 2017-10-28 DIAGNOSIS — F17200 Nicotine dependence, unspecified, uncomplicated: Secondary | ICD-10-CM

## 2017-10-28 NOTE — Patient Instructions (Addendum)
It is nice to see you today. I am proud of you for quitting smoking. We will refer you to the cancer doctor.( Referral to Dr. Mohammed/ oncology) Use your inhaler for shortness of breath or wheezing. Follow up with Dr. Lamonte Sakai in 3 months Please contact office for sooner follow up if symptoms do not improve or worsen or seek emergency care

## 2017-10-28 NOTE — Assessment & Plan Note (Signed)
Patient quit smoking 07/01/2017 Plan I have spent 3 minutes counseling patient on continued smoking cessation this visit.

## 2017-10-28 NOTE — Assessment & Plan Note (Addendum)
Currently not on maintenance inhaler therapy for COPD Plan Continue using rescue inhaler as needed for shortness of breath We will discuss possible maintenance therapy at follow-up will we have time for extensive teaching. After discussing the finding of lung cancer today patient was in no state to tackle maintenance inhaler education. Patient's blindness and medical illiteracy in addition to her lack of resources will make maintaining maintenance therapy difficult at best.  She will need extensive teaching

## 2017-10-28 NOTE — Assessment & Plan Note (Signed)
Cytology report for fine-needle aspiration done 10/16/2017 by EBUS  + for squamous cell cancer These results reported to the patient today Plan Emotional support provided I am proud of you for quitting smoking. We will refer you to the cancer doctor.( Referral to Dr. Mohammed/ oncology) Use your inhaler for shortness of breath or wheezing. Follow up with Dr. Lamonte Sakai in 3 months Please contact office for sooner follow up if symptoms do not improve or worsen or seek emergency care

## 2017-10-28 NOTE — Progress Notes (Signed)
History of Present Illness [name] is a 77 y.o. female with a history of tobacco abuse  and COPD who has been followed in our office for a right upper lobe consolidation and suspected mass, large mediastinal K.  Okay to which you could try to cut the baby got fair lymphadenopathy.She is followed by Dr. Lamonte Sakai.  10/28/2017  Hospital Follow up.  77 year old woman with history of tobacco use and COPD who has been followed in our office for a right upper lobe consolidation and suspected mass, large mediastinal lymphadenopathy.  She has undergone serial CT scans and a PET scan that confirmed hyper metabolism in the right upper lobe and her lymph nodes.  She was recently admitted for a right upper lobe postobstructive pneumonia and treated.  We arranged at that time for her to return for bronchoscopy with endobronchial ultrasound to facilitate biopsies and a tissue diagnosis.  This was done on 10/16/2017 without incident.  She tolerated well.  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 She was admitted for observation post procedure. She was well on 10/17/2017.  She reported a small amount of blood-tinged sputum, no chest pain, no dyspnea.    She was  discharged home on 10/17/2017.  She will be called with her bronchoscopy results.   She has a follow-up visit in our office with Eric Form, NP.   Pt. States that she has been doing well. We discussed that her biopsy was positive for lung cancer.I explained that we will refer her to oncology.She is very angry and obviously upset.  She is here alone.  She does not understand why this was not evaluated earlier through her PCP.  I explained that our best plan moving forward was to refer her to oncology where Dr. Earlie Server can explain  her best options for treatment.  She is upset that she quit smoking in February, and now has been diagnosed with cancer.  I provided emotional  support, and reassured her that we would make sure she is evaluated by the best doctor possible for treatment.  She asked me to call a friend and explained this to them.  I discussed this with her friend , that she dialed on her cell phone, so that her friend could continue to provide emotional support and ensure that she follows up with oncology.  Patient is legally blind and is currently waiting for scat transportation home.  She denies fever, chest pain, orthopnea, or hemoptysis.  She was calm upon leaving the office.  We are okay to Test Results:  STUDIES:  Cytology report right upper lung mass Obtained 10/16/2017 via EBUS  Squamous cell carcinoma  CT chest 09/13/2017 >> right upper lobe apical and posterior segments with consolidation, concerning for possible postobstructive process or mass hidden within pneumonia.  There is significant precarinal mediastinal lymphadenopathy, right hilar adenopathy   PET scan 09/29/2017 >> previously demonstrated masslike consolidation in the right upper lobe with homogeneous high level hypermetabolic activity, SUV max 14.8.  There are associated small subpleural nodules in the right upper lobe there also mildly hypermetabolic.  Small hypermetabolic lingular nodule.  There is a hypermetabolic right hilar and precarinal lymph node.  The precarinal node measures 2.2 x 2.9 cm with an SUV max of 12.5.    CBC Latest Ref Rng & Units 10/13/2017 10/12/2017 10/11/2017  WBC 4.0 - 10.5 K/uL 12.1(H) 13.2(H) 14.4(H)  Hemoglobin 12.0 - 15.0 g/dL 11.4(L) 12.4  4.9(LL)  Hematocrit 36.0 - 46.0 % 34.5(L) 37.5 15.6(L)  Platelets 150 - 400 K/uL 364 370 530(H)    BMP Latest Ref Rng & Units 10/12/2017 10/11/2017 09/15/2017  Glucose 65 - 99 mg/dL 110(H) 123(H) 130(H)  BUN 6 - 20 mg/dL 16 14 21(H)  Creatinine 0.44 - 1.00 mg/dL 1.46(H) 1.53(H) 1.83(H)  BUN/Creat Ratio 12 - 28 - - -  Sodium 135 - 145 mmol/L 138 137 139  Potassium 3.5 - 5.1 mmol/L 3.8 4.2 4.0  Chloride 101 - 111 mmol/L 103 102  106  CO2 22 - 32 mmol/L 23 28 24   Calcium 8.9 - 10.3 mg/dL 9.0 9.2 9.0    BNP    Component Value Date/Time   BNP 10.6 02/09/2015 1942    ProBNP    Component Value Date/Time   PROBNP 31.7 02/20/2014 1918    PFT    Component Value Date/Time   FEV1PRE 1.17 10/09/2017 1624   FVCPRE 1.58 10/09/2017 1624   PREFEV1FVCRT 74 10/09/2017 1624    Dg Chest 2 View  Result Date: 10/11/2017 CLINICAL DATA:  77 year old female with history of chest pain for the past several months. Pain most severe during taking deep breaths. EXAM: CHEST - 2 VIEW COMPARISON:  Chest x-ray 09/13/2017. Chest CT 09/13/2017. PET-CT 09/29/2017. FINDINGS: Previously noted apical mass and right upper lobe volume loss remains highly concerning for primary bronchogenic carcinoma. Widespread interstitial prominence throughout all aspects of the lungs bilaterally, suggestive of underlying interstitial lung disease. No definite acute consolidative airspace disease. No pleural effusions. Heart size is upper limits of normal. Fullness in the right hilar region, concerning for lymphadenopathy. Fullness in the right paratracheal soft tissues also concerning for adenopathy. Aortic atherosclerosis. IMPRESSION: 1. Findings remain highly concerning for right upper lobe lung cancer with associated right hilar and mediastinal adenopathy. 2. The appearance of the lungs suggest concurrent interstitial lung disease. 3. Aortic atherosclerosis. Electronically Signed   By: Vinnie Langton M.D.   On: 10/11/2017 16:38   Ct Chest High Resolution  Result Date: 10/13/2017 CLINICAL DATA:  77 year old female with shortness of breath. EXAM: CT CHEST WITHOUT CONTRAST TECHNIQUE: Multidetector CT imaging of the chest was performed following the standard protocol without intravenous contrast. High resolution imaging of the lungs, as well as inspiratory and expiratory imaging, was performed. COMPARISON:  Chest CT 09/13/2017.  PET-CT 09/29/2017. FINDINGS:  Cardiovascular: Heart size is normal. There is no significant pericardial fluid, thickening or pericardial calcification. There is aortic atherosclerosis, as well as atherosclerosis of the great vessels of the mediastinum and the coronary arteries, including calcified atherosclerotic plaque in the left main, left anterior descending, left circumflex and right coronary arteries. Mediastinum/Nodes: Multiple enlarged and borderline enlarged mediastinal and right hilar lymph nodes, largest of which is a low right paratracheal lymph node measuring 2.2 cm in short axis (axial image 48 of series 2). Esophagus is unremarkable in appearance. No axillary lymphadenopathy. Lungs/Pleura: Large right upper lobe mass near the apex with surrounding obstructive changes and airspace consolidation. The underlying mass is difficult to assess, but the entire region measures approximately 6.3 x 7.1 cm, corresponding to the area of hypermetabolism on the recent PET-CT. Small right pleural effusion with some dependent subsegmental atelectasis in the right lower lobe. High-resolution images demonstrates some patchy areas of peripheral predominant septal thickening and mild ground-glass attenuation. No frank honeycombing confidently identified. These interstitial lung changes do have a mild craniocaudal gradient. No traction bronchiectasis. Some peripheral bronchiolectasis is evident. Inspiratory and expiratory imaging is unremarkable. Upper  Abdomen: Aortic atherosclerosis. Musculoskeletal: There are no aggressive appearing lytic or blastic lesions noted in the visualized portions of the skeleton. IMPRESSION: 1. Large right upper lobe mass with associated right hilar and mediastinal lymphadenopathy redemonstrated, as above. 2. There are changes in the lungs suggestive of interstitial lung disease. At this time, the pattern is considered indeterminate for usual interstitial pneumonia (UIP), however, given the slight craniocaudal gradient  underlying UIP is suspected. Attention on followup studies is recommended. 3. Small right pleural effusion lying dependently. 4. Aortic atherosclerosis, in addition to left main and 3 vessel coronary artery disease. Assessment for potential risk factor modification, dietary therapy or pharmacologic therapy may be warranted, if clinically indicated. Aortic Atherosclerosis (ICD10-I70.0). Electronically Signed   By: Vinnie Langton M.D.   On: 10/13/2017 12:11   Nm Pet Image Initial (pi) Skull Base To Thigh  Result Date: 09/29/2017 CLINICAL DATA:  Initial treatment strategy for right upper lobe consolidation and mediastinal adenopathy on CT, suspicious for bronchogenic carcinoma. EXAM: NUCLEAR MEDICINE PET SKULL BASE TO THIGH TECHNIQUE: 6.68 mCi F-18 FDG was injected intravenously. Full-ring PET imaging was performed from the skull base to thigh after the radiotracer. CT data was obtained and used for attenuation correction and anatomic localization. Fasting blood glucose: 115 mg/dl COMPARISON:  Chest CT 09/13/2017.  Abdominopelvic CT 01/11/2017. FINDINGS: Mediastinal blood pool activity: SUV max 2.8 NECK: There is a small hypermetabolic left level IIB node, measuring 5 mm short axis on image 26 (SUV max 4.9). No other hypermetabolic cervical lymph nodes are seen.There are no lesions of the pharyngeal mucosal space. Incidental CT findings: 12 mm right thyroid nodule on image 48 demonstrates no abnormal metabolic activity. There is carotid atherosclerosis bilaterally. CHEST: The previously demonstrated mass-like consolidation within the right upper lobe demonstrates homogeneous marked hypermetabolic activity with an SUV max of 14.8. This measures up to 7.0 x 4.8 cm transverse on image 57/4. Small subpleural nodules in the right upper lobe are also mildly hypermetabolic for size. These include a 5 x 12 mm nodule on image 19/8 (SUV max 3.0) and a 9 x 7 mm nodule on image 24/8 (SUV max 3.6). There is a small nodule in  the lingula which is also hypermetabolic for size, measuring 4 x 6 mm on image 29/8 (SUV max 2.4). There is hypermetabolic right hilar and precarinal adenopathy. Precarinal node measures 2.2 x 2.9 cm on image 67 and has an SUV max of 12.5. Right hilar node has an SUV max 8.6. Incidental CT findings: Diffuse atherosclerosis of the aorta, great vessels and coronary arteries. Subpleural reticulation and additional scattered nodularity throughout both lungs. ABDOMEN/PELVIS: There is no hypermetabolic activity within the liver, adrenal glands, spleen or pancreas. There is no hypermetabolic nodal activity. Incidental CT findings: Bilateral renal cysts and diffuse aortic and branch vessel atherosclerosis noted. SKELETON: There is no hypermetabolic activity to suggest osseous metastatic disease. Incidental CT findings: Mild multilevel spondylosis. IMPRESSION: 1. Mass-like right upper lobe consolidation demonstrates marked hypermetabolic activity and is associated with hypermetabolic right hilar and mediastinal lymph nodes, again suspicious for thoracic malignancy. Tissue sampling recommended. 2. Indeterminate small hypermetabolic level IIB left cervical lymph node. This may be unrelated. 3. Indeterminate additional small, hypermetabolic pulmonary nodules bilaterally. 4. No evidence of metastatic disease in the abdomen or pelvis. Electronically Signed   By: Richardean Sale M.D.   On: 09/29/2017 16:47     Past medical hx Past Medical History:  Diagnosis Date  . Anemia   . Arthritis    "left  shoulder" (04/07/2016)  . Blind    s/p bilateral corneal transplant; "legally blind in both eyes" (04/07/2016)  . CKD (chronic kidney disease), stage III ([place])   . COPD (chronic obstructive pulmonary disease) ([place])    pt denies  . DVT (deep venous thrombosis) ([place]) "years ago"   "right thigh"    . HEARING LOSS   . Hyperlipidemia   . Hypertension   . PAD (peripheral artery disease) ([place])    a. 03/2016 Periph Angio: L  SFA 99p, 55m w/ one vessel runoff. R SFA 134m w/ one vessel runofff;  b. 04/07/2016 PTA L SFA ([place] 1 LS atherecromty w/ DEBA). c. 04/24/16 s/p PTA to R SFA, right tibioperoneal trunk and posterior tibial artery.  . Phlebitis   . Tobacco abuse      Social History   Tobacco Use  . Smoking status: Former Smoker    Packs/day: 0.50    Years: 62.00    Pack years: 31.00    Types: Cigarettes  . Smokeless tobacco: Never Used  . Tobacco comment: Quit 06/2017  Substance Use Topics  . Alcohol use: No    Alcohol/week: 0.0 oz  . Drug use: No    Ms.Melgoza reports that she has quit smoking. Her smoking use included cigarettes. She has a 31.00 pack-year smoking history. She has never used smokeless tobacco. She reports that she does not drink alcohol or use drugs.  Tobacco Cessation: I have spent 3 minutes counseling patient on continued smoking cessation this visit.  Past surgical hx, Family hx, Social hx all reviewed.  Current Outpatient Medications on File Prior to Visit  Medication Sig  . albuterol (PROVENTIL HFA;VENTOLIN HFA) 108 (90 Base) MCG/ACT inhaler Inhale 2 puffs into the lungs every 6 (six) hours as needed for wheezing or shortness of breath.  Marland Kitchen atorvastatin (LIPITOR) 40 MG tablet Take 1 tablet (40 mg total) by mouth daily.  . clopidogrel (PLAVIX) 75 MG tablet Take 1 tablet (75 mg total) by mouth daily with breakfast. Okay to restart this medication on Sunday 10/18/2017.  Marland Kitchen Spacer/Aero-Holding Chambers (AEROCHAMBER MV) inhaler Use as instructed  . [DISCONTINUED] enoxaparin (LOVENOX) 80 MG/0.8ML SOLN injection Inject 0.8 mLs (80 mg total) into the skin every 12 (twelve) hours.   No current facility-administered medications on file prior to visit.      Allergies  Allergen Reactions  . Codeine Other (See Comments)    REACTION: Pt c/o "feeling high"  . Morphine Other (See Comments)    REACTION: feels funny and "high"    Review Of Systems:  Constitutional:   +  weight loss,  night sweats,  Fevers, chills, + fatigue, or  lassitude.  HEENT:   No headaches,  Difficulty swallowing,  Tooth/dental problems, or  Sore throat,                No sneezing, itching, ear ache, nasal congestion, post nasal drip,   CV:  No chest pain,  Orthopnea, PND, swelling in lower extremities, anasarca, dizziness, palpitations, syncope.   GI  No heartburn, indigestion, abdominal pain, nausea, vomiting, diarrhea, change in bowel habits, loss of appetite, bloody stools.   Resp: + shortness of breath with exertion less at rest.  No excess mucus, no productive cough,  + non-productive cough,  No coughing up of blood.  No change in color of mucus.  Occasional wheezing.  No chest wall deformity  Skin: no rash or lesions.  GU: no dysuria, change in color of urine, no urgency or frequency.  No flank pain, no hematuria   MS:  No joint pain or swelling.  + decreased range of motion.  No back pain.  Psych:  No change in mood or affect. No depression or anxiety.  No memory loss.   Vital Signs BP 132/64 (BP Location: Left Arm, Cuff Size: Normal)   Pulse (!) 102   Ht 5\' 4"  (1.626 m)   Wt 129 lb 3.2 oz (58.6 kg)   LMP  (LMP Unknown)   SpO2 99%   BMI 22.18 kg/m    Physical Exam:  General- No distress,  A&Ox3, pleasant ENT: No sinus tenderness, TM clear, pale nasal mucosa, no oral exudate,no post nasal drip, no LAN Cardiac: S1, S2, regular rate and rhythm, no murmur Chest: No wheeze/ rales/ dullness; no accessory muscle use, no nasal flaring, no sternal retractions, diminished per bases bilaterally Abd.: Soft Non-tender, bowel sounds positive, nondistended, nonobese Ext: No clubbing cyanosis, edema Neuro: Deconditioned at baseline, walks with a walker, moving all extremities x4, alert and oriented x3 Skin: No rashes, warm and dry Psych: Anxious and obviously upset   Assessment/Plan  Mass of upper lobe of right lung Cytology report for fine-needle aspiration done 10/16/2017 by EBUS  +  for squamous cell cancer These results reported to the patient today Plan Emotional support provided I am proud of you for quitting smoking. We will refer you to the cancer doctor.( Referral to Dr. Mohammed/ oncology) Use your inhaler for shortness of breath or wheezing. Follow up with Dr. Lamonte Sakai in 3 months Please contact office for sooner follow up if symptoms do not improve or worsen or seek emergency care     TOBACCO ABUSE Patient quit smoking 07/01/2017 Plan I have spent 3 minutes counseling patient on continued smoking cessation this visit.  COPD (chronic obstructive pulmonary disease) (H[facility]) Currently not on maintenance inhaler therapy for COPD Plan Continue using rescue inhaler as needed for shortness of breath We will discuss possible maintenance therapy at follow-up will we have time for extensive teaching. After discussing the finding of lung cancer today patient was in no state to tackle maintenance inhaler education. Patient's blindness and medical illiteracy in addition to her lack of resources will make maintaining maintenance therapy difficult at best.  She will need extensive teaching    Magdalen Spatz, NP 10/28/2017  8:13 PM

## 2017-10-29 ENCOUNTER — Encounter: Payer: Self-pay | Admitting: *Deleted

## 2017-10-29 ENCOUNTER — Telehealth: Payer: Self-pay | Admitting: *Deleted

## 2017-10-29 DIAGNOSIS — H548 Legal blindness, as defined in USA: Secondary | ICD-10-CM | POA: Diagnosis not present

## 2017-10-29 DIAGNOSIS — J8410 Pulmonary fibrosis, unspecified: Secondary | ICD-10-CM | POA: Diagnosis not present

## 2017-10-29 DIAGNOSIS — N183 Chronic kidney disease, stage 3 unspecified: Secondary | ICD-10-CM

## 2017-10-29 DIAGNOSIS — J181 Lobar pneumonia, unspecified organism: Secondary | ICD-10-CM

## 2017-10-29 DIAGNOSIS — I129 Hypertensive chronic kidney disease with stage 1 through stage 4 chronic kidney disease, or unspecified chronic kidney disease: Secondary | ICD-10-CM | POA: Diagnosis not present

## 2017-10-29 DIAGNOSIS — M19012 Primary osteoarthritis, left shoulder: Secondary | ICD-10-CM | POA: Diagnosis not present

## 2017-10-29 DIAGNOSIS — I739 Peripheral vascular disease, unspecified: Secondary | ICD-10-CM | POA: Diagnosis not present

## 2017-10-29 DIAGNOSIS — J439 Emphysema, unspecified: Secondary | ICD-10-CM | POA: Diagnosis not present

## 2017-10-29 DIAGNOSIS — H9190 Unspecified hearing loss, unspecified ear: Secondary | ICD-10-CM | POA: Diagnosis not present

## 2017-10-29 DIAGNOSIS — Z87891 Personal history of nicotine dependence: Secondary | ICD-10-CM | POA: Diagnosis not present

## 2017-10-29 DIAGNOSIS — Z7902 Long term (current) use of antithrombotics/antiplatelets: Secondary | ICD-10-CM | POA: Diagnosis not present

## 2017-10-29 DIAGNOSIS — Z7982 Long term (current) use of aspirin: Secondary | ICD-10-CM | POA: Diagnosis not present

## 2017-10-29 DIAGNOSIS — Z86718 Personal history of other venous thrombosis and embolism: Secondary | ICD-10-CM | POA: Diagnosis not present

## 2017-10-29 DIAGNOSIS — I2510 Atherosclerotic heart disease of native coronary artery without angina pectoris: Secondary | ICD-10-CM | POA: Diagnosis not present

## 2017-10-29 DIAGNOSIS — D631 Anemia in chronic kidney disease: Secondary | ICD-10-CM | POA: Diagnosis not present

## 2017-10-29 DIAGNOSIS — E785 Hyperlipidemia, unspecified: Secondary | ICD-10-CM | POA: Diagnosis not present

## 2017-10-29 DIAGNOSIS — R918 Other nonspecific abnormal finding of lung field: Secondary | ICD-10-CM | POA: Diagnosis not present

## 2017-10-29 DIAGNOSIS — I700 Atherosclerosis of aorta: Secondary | ICD-10-CM | POA: Diagnosis not present

## 2017-10-29 NOTE — Telephone Encounter (Signed)
[facility] calls and states pt has expressed that the pain in her feet is getting worse and she is out of her pain med, she would like a refill of hydrocodone, she is ask about the allergy to codeine and states this one does not cause her problems, she would like a refill of hydrocodone.

## 2017-10-29 NOTE — Progress Notes (Signed)
.   Oncology Nurse Navigator Documentation  Oncology Nurse Navigator Flowsheets 10/29/2017  Navigator Location [facility]  Referral date to RadOnc/MedOnc 10/29/2017  Navigator Encounter Type Other/I received referral on Ms. Renier today.  I notified new patient coordinator to call patient and schedule her to be seen on 11/03/17 with Dr. Julien Nordmann and labs before.   Treatment Phase Pre-Tx/Tx Discussion  Barriers/Navigation Needs Coordination of Care  Interventions Coordination of Care  Coordination of Care Other  Acuity Level 2  Time Spent with Patient 30

## 2017-10-30 ENCOUNTER — Telehealth: Payer: Self-pay | Admitting: Internal Medicine

## 2017-10-30 DIAGNOSIS — R52 Pain, unspecified: Secondary | ICD-10-CM | POA: Diagnosis not present

## 2017-10-30 DIAGNOSIS — K5900 Constipation, unspecified: Secondary | ICD-10-CM | POA: Diagnosis not present

## 2017-10-30 DIAGNOSIS — R1084 Generalized abdominal pain: Secondary | ICD-10-CM | POA: Diagnosis not present

## 2017-10-30 NOTE — ED Triage Notes (Signed)
Pt has had abd pain for the past several days. Reports constipation but was able to have a BM pta Also reports NV today x 1 episode. Pt reports hx of "bowel blockage" and wants to make sure everything is ok.

## 2017-10-30 NOTE — Telephone Encounter (Signed)
New referral received from [facility] for dx of dx: mass of upper lobe of right lung. Pt has been scheduled to see Dr. Julien Nordmann on 6/25 at 2:15pm. Pt aware to arrive 30 minutes early for labs.

## 2017-10-31 ENCOUNTER — Encounter (HOSPITAL_COMMUNITY): Payer: Self-pay | Admitting: *Deleted

## 2017-10-31 ENCOUNTER — Other Ambulatory Visit: Payer: Self-pay

## 2017-10-31 ENCOUNTER — Emergency Department (HOSPITAL_COMMUNITY)
Admission: EM | Admit: 2017-10-31 | Discharge: 2017-10-31 | Disposition: A | Discharge status: Home / Self Care | Payer: Medicare Other | Attending: Emergency Medicine | Admitting: Emergency Medicine

## 2017-10-31 ENCOUNTER — Emergency Department (HOSPITAL_COMMUNITY): Payer: Medicare Other

## 2017-10-31 DIAGNOSIS — R109 Unspecified abdominal pain: Secondary | ICD-10-CM | POA: Diagnosis not present

## 2017-10-31 DIAGNOSIS — N183 Chronic kidney disease, stage 3 (moderate): Secondary | ICD-10-CM | POA: Diagnosis not present

## 2017-10-31 DIAGNOSIS — Z7902 Long term (current) use of antithrombotics/antiplatelets: Secondary | ICD-10-CM | POA: Insufficient documentation

## 2017-10-31 DIAGNOSIS — Z87891 Personal history of nicotine dependence: Secondary | ICD-10-CM | POA: Insufficient documentation

## 2017-10-31 DIAGNOSIS — Z79899 Other long term (current) drug therapy: Secondary | ICD-10-CM | POA: Diagnosis not present

## 2017-10-31 DIAGNOSIS — J449 Chronic obstructive pulmonary disease, unspecified: Secondary | ICD-10-CM | POA: Diagnosis not present

## 2017-10-31 DIAGNOSIS — I129 Hypertensive chronic kidney disease with stage 1 through stage 4 chronic kidney disease, or unspecified chronic kidney disease: Secondary | ICD-10-CM | POA: Insufficient documentation

## 2017-10-31 DIAGNOSIS — R1084 Generalized abdominal pain: Secondary | ICD-10-CM | POA: Diagnosis not present

## 2017-10-31 DIAGNOSIS — R112 Nausea with vomiting, unspecified: Secondary | ICD-10-CM | POA: Diagnosis not present

## 2017-10-31 DIAGNOSIS — K5900 Constipation, unspecified: Secondary | ICD-10-CM | POA: Diagnosis not present

## 2017-10-31 LAB — CBC
HEMATOCRIT: 41.5 % (ref 36.0–46.0)
HEMOGLOBIN: 12.8 g/dL (ref 12.0–15.0)
MCH: 28.4 pg (ref 26.0–34.0)
MCHC: 30.8 g/dL (ref 30.0–36.0)
MCV: 92.2 fL (ref 78.0–100.0)
PLATELETS: 338 10*3/uL (ref 150–400)
RBC: 4.5 MIL/uL (ref 3.87–5.11)
RDW: 15.5 % (ref 11.5–15.5)
WBC: 17.1 10*3/uL — AB (ref 4.0–10.5)

## 2017-10-31 LAB — COMPREHENSIVE METABOLIC PANEL
ALT: 11 U/L — AB (ref 14–54)
ANION GAP: 11 (ref 5–15)
AST: 15 U/L (ref 15–41)
Albumin: 2.6 g/dL — ABNORMAL LOW (ref 3.5–5.0)
Alkaline Phosphatase: 78 U/L (ref 38–126)
BUN: 14 mg/dL (ref 6–20)
CO2: 25 mmol/L (ref 22–32)
Calcium: 9.4 mg/dL (ref 8.9–10.3)
Chloride: 104 mmol/L (ref 101–111)
Creatinine, Ser: 1.8 mg/dL — ABNORMAL HIGH (ref 0.44–1.00)
GFR, EST AFRICAN AMERICAN: 30 mL/min — AB (ref >60)
GFR, EST NON AFRICAN AMERICAN: 26 mL/min — AB (ref >60)
Glucose, Bld: 115 mg/dL — ABNORMAL HIGH (ref 65–99)
POTASSIUM: 4 mmol/L (ref 3.5–5.1)
SODIUM: 140 mmol/L (ref 135–145)
Total Bilirubin: 0.8 mg/dL (ref 0.3–1.2)
Total Protein: 6.1 g/dL — ABNORMAL LOW (ref 6.5–8.1)

## 2017-10-31 LAB — LIPASE, BLOOD: LIPASE: 21 U/L (ref 11–51)

## 2017-10-31 MED ORDER — FENTANYL CITRATE (PF) 100 MCG/2ML IJ SOLN
100.0000 ug | Freq: Once | INTRAMUSCULAR | Status: AC
  Administered 2017-10-31: 100 ug via INTRAVENOUS
  Filled 2017-10-31: qty 2
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
## 2017-10-31 NOTE — ED Notes (Signed)
Pt taken to US

## 2017-11-02 ENCOUNTER — Emergency Department (HOSPITAL_COMMUNITY)
Admission: EM | Admit: 2017-11-02 | Discharge: 2017-11-02 | Disposition: A | Discharge status: Home / Self Care | Payer: Medicare Other | Attending: Physician Assistant | Admitting: Physician Assistant

## 2017-11-02 ENCOUNTER — Encounter: Payer: Self-pay | Admitting: Internal Medicine

## 2017-11-02 ENCOUNTER — Other Ambulatory Visit: Payer: Self-pay | Admitting: Internal Medicine

## 2017-11-02 ENCOUNTER — Other Ambulatory Visit: Payer: Self-pay

## 2017-11-02 ENCOUNTER — Emergency Department (HOSPITAL_COMMUNITY): Payer: Medicare Other

## 2017-11-02 ENCOUNTER — Ambulatory Visit (INDEPENDENT_AMBULATORY_CARE_PROVIDER_SITE_OTHER): Payer: Medicare Other | Admitting: Internal Medicine

## 2017-11-02 DIAGNOSIS — Z87891 Personal history of nicotine dependence: Secondary | ICD-10-CM | POA: Insufficient documentation

## 2017-11-02 DIAGNOSIS — Z742 Need for assistance at home and no other household member able to render care: Secondary | ICD-10-CM

## 2017-11-02 DIAGNOSIS — G8929 Other chronic pain: Secondary | ICD-10-CM | POA: Diagnosis not present

## 2017-11-02 DIAGNOSIS — J449 Chronic obstructive pulmonary disease, unspecified: Secondary | ICD-10-CM | POA: Insufficient documentation

## 2017-11-02 DIAGNOSIS — M79673 Pain in unspecified foot: Secondary | ICD-10-CM | POA: Diagnosis not present

## 2017-11-02 DIAGNOSIS — R0789 Other chest pain: Secondary | ICD-10-CM | POA: Diagnosis not present

## 2017-11-02 DIAGNOSIS — I959 Hypotension, unspecified: Secondary | ICD-10-CM | POA: Diagnosis not present

## 2017-11-02 DIAGNOSIS — R05 Cough: Secondary | ICD-10-CM | POA: Diagnosis not present

## 2017-11-02 DIAGNOSIS — C3411 Malignant neoplasm of upper lobe, right bronchus or lung: Secondary | ICD-10-CM | POA: Diagnosis not present

## 2017-11-02 DIAGNOSIS — R079 Chest pain, unspecified: Secondary | ICD-10-CM | POA: Diagnosis not present

## 2017-11-02 DIAGNOSIS — R918 Other nonspecific abnormal finding of lung field: Secondary | ICD-10-CM

## 2017-11-02 DIAGNOSIS — I129 Hypertensive chronic kidney disease with stage 1 through stage 4 chronic kidney disease, or unspecified chronic kidney disease: Secondary | ICD-10-CM | POA: Diagnosis not present

## 2017-11-02 DIAGNOSIS — N189 Chronic kidney disease, unspecified: Secondary | ICD-10-CM

## 2017-11-02 DIAGNOSIS — C3490 Malignant neoplasm of unspecified part of unspecified bronchus or lung: Secondary | ICD-10-CM | POA: Insufficient documentation

## 2017-11-02 DIAGNOSIS — E785 Hyperlipidemia, unspecified: Secondary | ICD-10-CM

## 2017-11-02 DIAGNOSIS — H548 Legal blindness, as defined in USA: Secondary | ICD-10-CM | POA: Insufficient documentation

## 2017-11-02 DIAGNOSIS — N183 Chronic kidney disease, stage 3 (moderate): Secondary | ICD-10-CM | POA: Insufficient documentation

## 2017-11-02 LAB — CBC
HEMATOCRIT: 38.2 % (ref 36.0–46.0)
HEMOGLOBIN: 11.8 g/dL — AB (ref 12.0–15.0)
MCH: 28.4 pg (ref 26.0–34.0)
MCHC: 30.9 g/dL (ref 30.0–36.0)
MCV: 91.8 fL (ref 78.0–100.0)
Platelets: 381 10*3/uL (ref 150–400)
RBC: 4.16 MIL/uL (ref 3.87–5.11)
RDW: 15.4 % (ref 11.5–15.5)
WBC: 14 10*3/uL — ABNORMAL HIGH (ref 4.0–10.5)

## 2017-11-02 LAB — BASIC METABOLIC PANEL
ANION GAP: 12 (ref 5–15)
BUN: 10 mg/dL (ref 6–20)
CHLORIDE: 101 mmol/L (ref 101–111)
CO2: 27 mmol/L (ref 22–32)
Calcium: 9.1 mg/dL (ref 8.9–10.3)
Creatinine, Ser: 1.45 mg/dL — ABNORMAL HIGH (ref 0.44–1.00)
GFR calc non Af Amer: 34 mL/min — ABNORMAL LOW (ref >60)
GFR, EST AFRICAN AMERICAN: 39 mL/min — AB (ref >60)
Glucose, Bld: 112 mg/dL — ABNORMAL HIGH (ref 65–99)
Potassium: 3.1 mmol/L — ABNORMAL LOW (ref 3.5–5.1)
Sodium: 140 mmol/L (ref 135–145)

## 2017-11-02 LAB — I-STAT TROPONIN, ED: Troponin i, poc: 0.01 ng/mL (ref 0.00–0.08)

## 2017-11-02 MED ORDER — HYDROCODONE-ACETAMINOPHEN 5-325 MG PO TABS
1.0000 | ORAL_TABLET | Freq: Once | ORAL | Status: AC
  Administered 2017-11-02: 1 via ORAL
  Filled 2017-11-02: qty 1

## 2017-11-02 MED ORDER — HYDROCODONE-ACETAMINOPHEN 5-325 MG PO TABS: 1.0000 | ORAL_TABLET | Freq: Four times a day (QID) | ORAL | 0 refills | Status: DC | PRN

## 2017-11-02 NOTE — Patient Instructions (Signed)
[name] - -  Thank you for coming in today.   Please keep your appointment tomorrow with Oncology.  Thank you!

## 2017-11-02 NOTE — Assessment & Plan Note (Addendum)
We discussed her diagnosis today.  I listened to her concerns around her diagnosis and treatment.  I advised her that the clinic was here for her whenever she needed.  She expressed a desire to speak to the physicians who treated her in the hospital, and I explained our model of treatment with primary care providers in the clinic and inpatient teams who see patients in the hospital.    Given her continued foot pain, she has been seen multiple times for this and was previously on a stable dose of hydrocodone-apap.  Will prescribe this today for her.  If she has any problem with constipation, she should take daily miralax.    I will see her back in 2-3 months, sooner if needed.    She is due to see Dr. Julien Nordmann tomorrow and I advised her that questions about treatment can be answered at that time.

## 2017-11-02 NOTE — Progress Notes (Addendum)
   Subjective:    Patient ID: [name], female    DOB: [birth year], 77 y.o.   MRN: [id number]  CC: follow up of lung cancer  HPI   [name] is a 77yo woman with PMH of COPD, CKD, HTN, HLD who presents for follow up of new lung cancer.    [name] visited with [place] today to discuss her lung cancer diagnosis.  She is very nervous about her diagnosis and worried about possible treatments, chemotherapy, radiation and the possibility of losing her hair.  She was frustrated that her diagnosis was just recently made as she was told by some doctors that the cancer had "been there" for a year.  I am not sure when or who gave her this information.  On review of chart, I saw her in October of last year.  On review of her chart at that time, I did recommend lung cancer screening with CT scan.  She had a noted history of nodules on her lungs (per patient report), heavy smoking, pulmonary fibrosis and was currently smoking.  She reported not being interested in screening at that time.  We discussed screening for lung cancer in February of 2019 again and she declined imaging at that time as well.   She has an appointment with Dr. Julien Nordmann tomorrow to discuss treatment options.  She expressed great faith and a hope in faith healing of her cancer.  She is also not sure that she really has cancer, as she feels like it "cannot be there."  At the same time, she professes lack of appetite and weight loss.     She is also frustrated that her pain medication was stopped in May after an admission.  She notes that she has chronic foot pain and because the medication was stopped, she is not able to stand to bathe or do her dishes.  She was in the ED this AM and was given a vicoden which did help with her pain.  She would like an increase in personal care services as her son is not able to help her much around the house.    Greater than 50% of our time was spent in discussion about her lung cancer and face to face  discussion of diagnosis.   I spent 20 minutes in face to face communication with the patient.      Review of Systems  Constitutional: Positive for appetite change and fatigue. Negative for chills and unexpected weight change.  Respiratory: Positive for cough. Negative for shortness of breath.   Cardiovascular: Positive for chest pain and leg swelling.  Musculoskeletal: Positive for arthralgias and gait problem.      Objective:   Physical Exam  Constitutional:  Elderly woman, in NAD  Psychiatric: She has a normal mood and affect.  She is nervous and at times upset, which is normal given her current medical situation  Nursing note and vitals reviewed.      Assessment & Plan:  Follow up with Oncology and Pulmonology as planned.  I advised her to make an appointment with me as needed in the short term, RTC in 2-3 months

## 2017-11-02 NOTE — Discharge Instructions (Signed)
You need to follow-up with your physicians appointment this morning at 8:45 AM.  We are going to try her best to get you to this appointment.  We want you to discuss with them your need for additional pain medication.  Please return to the emergency department if you are in need of anything.

## 2017-11-02 NOTE — ED Notes (Signed)
Patient transported to X-ray 

## 2017-11-02 NOTE — ED Notes (Signed)
Patient is back from CT, she is requesting pain medicine. She reports that she wants something for pain and then go to her 0845 appointment this morning
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
## 2017-11-02 NOTE — Telephone Encounter (Signed)
Saw patient today in clinic and just sent in a refill.  Thanks!

## 2017-11-02 NOTE — ED Triage Notes (Signed)
Pt to ED via EMS for cp with cough and deep breathing only. Pt recently diagnosed with lung cancer. Treated this weekend for same and given pain meds that helped but no prescription. Pt has appointment at doctor for 8:30 this morning. NAD

## 2017-11-03 ENCOUNTER — Inpatient Hospital Stay: Payer: Medicare Other

## 2017-11-03 ENCOUNTER — Inpatient Hospital Stay: Payer: Medicare Other | Attending: Internal Medicine | Admitting: Internal Medicine

## 2017-11-03 ENCOUNTER — Encounter: Payer: Self-pay | Admitting: Internal Medicine

## 2017-11-03 ENCOUNTER — Encounter: Payer: Self-pay | Admitting: *Deleted

## 2017-11-03 VITALS — BP 156/79 | HR 96 | Resp 18 | Ht 64.0 in | Wt 127.4 lb

## 2017-11-03 DIAGNOSIS — Z7189 Other specified counseling: Secondary | ICD-10-CM

## 2017-11-03 DIAGNOSIS — Z79899 Other long term (current) drug therapy: Secondary | ICD-10-CM | POA: Insufficient documentation

## 2017-11-03 DIAGNOSIS — I10 Essential (primary) hypertension: Secondary | ICD-10-CM

## 2017-11-03 DIAGNOSIS — R590 Localized enlarged lymph nodes: Secondary | ICD-10-CM | POA: Insufficient documentation

## 2017-11-03 DIAGNOSIS — R5382 Chronic fatigue, unspecified: Secondary | ICD-10-CM

## 2017-11-03 DIAGNOSIS — Z5111 Encounter for antineoplastic chemotherapy: Secondary | ICD-10-CM | POA: Insufficient documentation

## 2017-11-03 DIAGNOSIS — C3411 Malignant neoplasm of upper lobe, right bronchus or lung: Secondary | ICD-10-CM | POA: Insufficient documentation

## 2017-11-03 DIAGNOSIS — R918 Other nonspecific abnormal finding of lung field: Secondary | ICD-10-CM

## 2017-11-03 DIAGNOSIS — J181 Lobar pneumonia, unspecified organism: Secondary | ICD-10-CM

## 2017-11-03 DIAGNOSIS — C3491 Malignant neoplasm of unspecified part of right bronchus or lung: Secondary | ICD-10-CM | POA: Insufficient documentation

## 2017-11-03 LAB — CBC WITH DIFFERENTIAL (CANCER CENTER ONLY)
BASOS ABS: 0.1 10*3/uL (ref 0.0–0.1)
Basophils Relative: 1 %
EOS PCT: 0 %
Eosinophils Absolute: 0 10*3/uL (ref 0.0–0.5)
HEMATOCRIT: 37 % (ref 34.8–46.6)
Hemoglobin: 12 g/dL (ref 11.6–15.9)
LYMPHS PCT: 10 %
Lymphs Abs: 1.6 10*3/uL (ref 0.9–3.3)
MCH: 28.7 pg (ref 25.1–34.0)
MCHC: 32.5 g/dL (ref 31.5–36.0)
MCV: 88.5 fL (ref 79.5–101.0)
MONO ABS: 1.5 10*3/uL — AB (ref 0.1–0.9)
MONOS PCT: 9 %
NEUTROS ABS: 12.6 10*3/uL — AB (ref 1.5–6.5)
Neutrophils Relative %: 80 %
PLATELETS: 379 10*3/uL (ref 145–400)
RBC: 4.18 MIL/uL (ref 3.70–5.45)
RDW: 16 % — AB (ref 11.2–14.5)
WBC Count: 15.7 10*3/uL — ABNORMAL HIGH (ref 3.9–10.3)

## 2017-11-03 LAB — CMP (CANCER CENTER ONLY)
ALT: 7 U/L (ref 0–44)
ANION GAP: 10 (ref 5–15)
AST: 9 U/L — AB (ref 15–41)
Albumin: 2.6 g/dL — ABNORMAL LOW (ref 3.5–5.0)
Alkaline Phosphatase: 81 U/L (ref 38–126)
BILIRUBIN TOTAL: 0.9 mg/dL (ref 0.3–1.2)
BUN: 11 mg/dL (ref 8–23)
CHLORIDE: 102 mmol/L (ref 98–111)
CO2: 27 mmol/L (ref 22–32)
Calcium: 9.9 mg/dL (ref 8.9–10.3)
Creatinine: 1.45 mg/dL — ABNORMAL HIGH (ref 0.44–1.00)
GFR, Est AFR Am: 39 mL/min — ABNORMAL LOW (ref >60)
GFR, Estimated: 34 mL/min — ABNORMAL LOW (ref >60)
GLUCOSE: 113 mg/dL — AB (ref 70–99)
POTASSIUM: 4.5 mmol/L (ref 3.5–5.1)
SODIUM: 139 mmol/L (ref 135–145)
Total Protein: 6.7 g/dL (ref 6.5–8.1)

## 2017-11-03 MED ORDER — PROCHLORPERAZINE MALEATE 10 MG PO TABS: 10.0000 mg | ORAL_TABLET | Freq: Four times a day (QID) | ORAL | 0 refills | Status: DC | PRN

## 2017-11-03 NOTE — Progress Notes (Signed)
Oncology Nurse Navigator Documentation  Oncology Nurse Navigator Flowsheets 11/03/2017  Navigator Location [facility]  Navigator Encounter Type Clinic/MDC/I spoke with patient today. I help explain her dx and treatment plan.  Help coordinate her upcoming appts.  I explained next steps and what to expect. She stated she did not know what to expect today so she did not eat. I provided her with food and drink.  She has transportation with [employer]. She lives alone but states her son lives near by.  She is a [facility] patient. I will update CSW on patient.    Abnormal Finding Date 08/12/2017  Confirmed Diagnosis Date 10/16/2017  Patient Visit Type MedOnc  Treatment Phase Pre-Tx/Tx Discussion  Barriers/Navigation Needs Education;Coordination of Care  Education Understanding Cancer/ Treatment Options;Other  Interventions Coordination of Care;Education  Coordination of Care Appts;Other  Education Method Verbal  Acuity Level 3  Time Spent with Patient 60

## 2017-11-03 NOTE — Progress Notes (Signed)
START ON PATHWAY REGIMEN - Non-Small Cell Lung     A cycle is every 21 days:     Pembrolizumab      Paclitaxel      Carboplatin   **Always confirm dose/schedule in your pharmacy ordering system**  Patient Characteristics: Stage IV Metastatic, Squamous, PS = 0, 1, First Line, PD-L1 Expression Positive 1-49% (TPS) / Negative / Not Tested / Awaiting Test Results AJCC T Category: T4 Current Disease Status: Distant Metastases AJCC N Category: N3 AJCC M Category: M1a AJCC 8 Stage Grouping: IVA Histology: Squamous Cell Line of therapy: First Line PD-L1 Expression Status: Did Not Order Test Performance Status: PS = 0, 1  Intent of Therapy: Non-Curative / Palliative Intent, Discussed with Patient
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
## 2017-11-04 ENCOUNTER — Telehealth: Payer: Self-pay | Admitting: Nurse Practitioner

## 2017-11-04 ENCOUNTER — Telehealth: Payer: Self-pay

## 2017-11-04 ENCOUNTER — Encounter: Payer: Self-pay | Admitting: *Deleted

## 2017-11-04 DIAGNOSIS — C3491 Malignant neoplasm of unspecified part of right bronchus or lung: Secondary | ICD-10-CM

## 2017-11-04 DIAGNOSIS — I129 Hypertensive chronic kidney disease with stage 1 through stage 4 chronic kidney disease, or unspecified chronic kidney disease: Secondary | ICD-10-CM | POA: Diagnosis not present

## 2017-11-04 DIAGNOSIS — E785 Hyperlipidemia, unspecified: Secondary | ICD-10-CM | POA: Diagnosis not present

## 2017-11-04 DIAGNOSIS — Z7902 Long term (current) use of antithrombotics/antiplatelets: Secondary | ICD-10-CM | POA: Diagnosis not present

## 2017-11-04 DIAGNOSIS — Z7982 Long term (current) use of aspirin: Secondary | ICD-10-CM | POA: Diagnosis not present

## 2017-11-04 DIAGNOSIS — D631 Anemia in chronic kidney disease: Secondary | ICD-10-CM | POA: Diagnosis not present

## 2017-11-04 DIAGNOSIS — Z86718 Personal history of other venous thrombosis and embolism: Secondary | ICD-10-CM | POA: Diagnosis not present

## 2017-11-04 DIAGNOSIS — Z87891 Personal history of nicotine dependence: Secondary | ICD-10-CM | POA: Diagnosis not present

## 2017-11-04 DIAGNOSIS — H9190 Unspecified hearing loss, unspecified ear: Secondary | ICD-10-CM | POA: Diagnosis not present

## 2017-11-04 DIAGNOSIS — J439 Emphysema, unspecified: Secondary | ICD-10-CM | POA: Diagnosis not present

## 2017-11-04 DIAGNOSIS — M19012 Primary osteoarthritis, left shoulder: Secondary | ICD-10-CM | POA: Diagnosis not present

## 2017-11-04 DIAGNOSIS — J8410 Pulmonary fibrosis, unspecified: Secondary | ICD-10-CM | POA: Diagnosis not present

## 2017-11-04 DIAGNOSIS — I700 Atherosclerosis of aorta: Secondary | ICD-10-CM | POA: Diagnosis not present

## 2017-11-04 DIAGNOSIS — H548 Legal blindness, as defined in USA: Secondary | ICD-10-CM | POA: Diagnosis not present

## 2017-11-04 DIAGNOSIS — I739 Peripheral vascular disease, unspecified: Secondary | ICD-10-CM | POA: Diagnosis not present

## 2017-11-04 DIAGNOSIS — R918 Other nonspecific abnormal finding of lung field: Secondary | ICD-10-CM | POA: Diagnosis not present

## 2017-11-04 DIAGNOSIS — N183 Chronic kidney disease, stage 3 (moderate): Secondary | ICD-10-CM | POA: Diagnosis not present

## 2017-11-04 DIAGNOSIS — I2510 Atherosclerotic heart disease of native coronary artery without angina pectoris: Secondary | ICD-10-CM | POA: Diagnosis not present

## 2017-11-04 NOTE — Telephone Encounter (Signed)
[facility] template change - moved 7/9 chemo ed class to 8 am and adjusted lab time to be after class. Lab 10 am and infusion 11 am. Spoke with patient re new start time.

## 2017-11-04 NOTE — Telephone Encounter (Signed)
Spoke with patient concerning upcoming appointment. Per 6/25 los will be mailing her a new calender of all appointment that was scheduled

## 2017-11-04 NOTE — Progress Notes (Signed)
Oncology Nurse Navigator Documentation  Oncology Nurse Navigator Flowsheets 11/04/2017  Navigator Location [facility]  Navigator Encounter Type Other/MRI brain ordered per transcription.  Will update managed care team to hep expedite authorization.   Treatment Phase Pre-Tx/Tx Discussion  Barriers/Navigation Needs Coordination of Care  Interventions Coordination of Care  Coordination of Care Other  Acuity Level 2  Time Spent with Patient 15

## 2017-11-05 ENCOUNTER — Telehealth: Payer: Self-pay | Admitting: *Deleted

## 2017-11-05 DIAGNOSIS — C3491 Malignant neoplasm of unspecified part of right bronchus or lung: Secondary | ICD-10-CM

## 2017-11-05 NOTE — Telephone Encounter (Signed)
Agree with this plan.  Let me know what else I need to do.

## 2017-11-05 NOTE — Telephone Encounter (Signed)
Amy, RN with [facility] called stating her last day with patient is 11/16/2017 due to new medicare/medicaid requirements on re certifying patients for home health. Patient to begin chemo 11/17/2017 for stage 4 lung CA. Amy recommending referral to [facility] for palliative care. States patient is amenable to this. Patient is on [facility] registry. Will route to PCP for consideration. Hubbard Hartshorn, RN, BSN

## 2017-11-06 ENCOUNTER — Ambulatory Visit: Payer: Medicare Other

## 2017-11-06 NOTE — Telephone Encounter (Signed)
Referral placed.

## 2017-11-09 ENCOUNTER — Other Ambulatory Visit: Payer: Self-pay | Admitting: *Deleted

## 2017-11-09 NOTE — Patient Outreach (Signed)
[facility]) Care Management  11/09/2017  JAMYAH FOLK 1941-05-06 [id number]   Telephone Screen  Referral Date: 11/06/17 Referral Source: MD Referral- [facility] Referral Reason: New diagnosis of lung cancer  Insurance: [place] health care [employer] attempt # 1 No answer. [facility] RN CM left HIPAA compliant voicemail message along with CM's contact info.    Plan: [facility] RN CM sent an unsuccessful outreach letter and scheduled this patient for another call attempt within 4 business days  Aalliyah Kilker L. Lavina Hamman, RN, BSN, CCM [facility] Telephonic Care Management Care Coordinator Direct number [phone]  [facility] number [phone] Fax number [phone]

## 2017-11-10 ENCOUNTER — Telehealth: Payer: Self-pay | Admitting: *Deleted

## 2017-11-10 NOTE — Telephone Encounter (Signed)
Oncology Nurse Navigator Documentation  Oncology Nurse Navigator Flowsheets 11/10/2017  Navigator Location [facility]  Navigator Encounter Type Telephone/I followed up on Ms. Rosell's schedule. She is not scheduled for her MRI Brain.  I called her to help her arrange the appt. I was unable to reach her but did leave her a vm message for her to call me with my name and phone number.   Telephone Outgoing Call  Treatment Phase Pre-Tx/Tx Discussion  Barriers/Navigation Needs Coordination of Care  Interventions Coordination of Care  Coordination of Care Other  Acuity Level 1  Time Spent with Patient 15

## 2017-11-13 ENCOUNTER — Ambulatory Visit: Payer: Self-pay | Admitting: *Deleted

## 2017-11-15 ENCOUNTER — Encounter (HOSPITAL_COMMUNITY): Payer: Self-pay | Admitting: Emergency Medicine

## 2017-11-15 ENCOUNTER — Emergency Department (HOSPITAL_COMMUNITY)
Admission: EM | Admit: 2017-11-15 | Discharge: 2017-11-15 | Disposition: A | Discharge status: Home / Self Care | Payer: Medicare Other | Attending: Emergency Medicine | Admitting: Emergency Medicine

## 2017-11-15 ENCOUNTER — Emergency Department (HOSPITAL_COMMUNITY): Payer: Medicare Other

## 2017-11-15 DIAGNOSIS — R112 Nausea with vomiting, unspecified: Secondary | ICD-10-CM | POA: Diagnosis not present

## 2017-11-15 DIAGNOSIS — N183 Chronic kidney disease, stage 3 (moderate): Secondary | ICD-10-CM | POA: Diagnosis not present

## 2017-11-15 DIAGNOSIS — Z7902 Long term (current) use of antithrombotics/antiplatelets: Secondary | ICD-10-CM | POA: Diagnosis not present

## 2017-11-15 DIAGNOSIS — C3411 Malignant neoplasm of upper lobe, right bronchus or lung: Secondary | ICD-10-CM | POA: Diagnosis not present

## 2017-11-15 DIAGNOSIS — R0789 Other chest pain: Secondary | ICD-10-CM | POA: Diagnosis not present

## 2017-11-15 DIAGNOSIS — Z79899 Other long term (current) drug therapy: Secondary | ICD-10-CM | POA: Insufficient documentation

## 2017-11-15 DIAGNOSIS — C3491 Malignant neoplasm of unspecified part of right bronchus or lung: Secondary | ICD-10-CM | POA: Diagnosis not present

## 2017-11-15 DIAGNOSIS — R1084 Generalized abdominal pain: Secondary | ICD-10-CM | POA: Insufficient documentation

## 2017-11-15 DIAGNOSIS — I129 Hypertensive chronic kidney disease with stage 1 through stage 4 chronic kidney disease, or unspecified chronic kidney disease: Secondary | ICD-10-CM | POA: Insufficient documentation

## 2017-11-15 DIAGNOSIS — Z87891 Personal history of nicotine dependence: Secondary | ICD-10-CM | POA: Diagnosis not present

## 2017-11-15 DIAGNOSIS — R079 Chest pain, unspecified: Secondary | ICD-10-CM | POA: Diagnosis not present

## 2017-11-15 DIAGNOSIS — J449 Chronic obstructive pulmonary disease, unspecified: Secondary | ICD-10-CM | POA: Diagnosis not present

## 2017-11-15 HISTORY — DX: Malignant (primary) neoplasm, unspecified: C80.1

## 2017-11-15 LAB — BASIC METABOLIC PANEL
Anion gap: 12 (ref 5–15)
BUN: 37 mg/dL — AB (ref 8–23)
CHLORIDE: 105 mmol/L (ref 98–111)
CO2: 23 mmol/L (ref 22–32)
Calcium: 9.3 mg/dL (ref 8.9–10.3)
Creatinine, Ser: 1.59 mg/dL — ABNORMAL HIGH (ref 0.44–1.00)
GFR calc Af Amer: 35 mL/min — ABNORMAL LOW (ref >60)
GFR calc non Af Amer: 30 mL/min — ABNORMAL LOW (ref >60)
Glucose, Bld: 149 mg/dL — ABNORMAL HIGH (ref 70–99)
Potassium: 4.3 mmol/L (ref 3.5–5.1)
Sodium: 140 mmol/L (ref 135–145)

## 2017-11-15 LAB — URINALYSIS, ROUTINE W REFLEX MICROSCOPIC
Bilirubin Urine: NEGATIVE
GLUCOSE, UA: NEGATIVE mg/dL
Hgb urine dipstick: NEGATIVE
Ketones, ur: NEGATIVE mg/dL
Nitrite: NEGATIVE
PROTEIN: NEGATIVE mg/dL
Specific Gravity, Urine: 1.016 (ref 1.005–1.030)
pH: 5 (ref 5.0–8.0)

## 2017-11-15 LAB — HEPATIC FUNCTION PANEL
ALBUMIN: 2.2 g/dL — AB (ref 3.5–5.0)
ALK PHOS: 70 U/L (ref 38–126)
ALT: 23 U/L (ref 0–44)
AST: 28 U/L (ref 15–41)
BILIRUBIN TOTAL: 0.8 mg/dL (ref 0.3–1.2)
Bilirubin, Direct: 0.2 mg/dL (ref 0.0–0.2)
Indirect Bilirubin: 0.6 mg/dL (ref 0.3–0.9)
Total Protein: 6.3 g/dL — ABNORMAL LOW (ref 6.5–8.1)

## 2017-11-15 LAB — CBC
HCT: 33.2 % — ABNORMAL LOW (ref 36.0–46.0)
Hemoglobin: 10 g/dL — ABNORMAL LOW (ref 12.0–15.0)
MCH: 28.2 pg (ref 26.0–34.0)
MCHC: 30.1 g/dL (ref 30.0–36.0)
MCV: 93.5 fL (ref 78.0–100.0)
Platelets: 384 10*3/uL (ref 150–400)
RBC: 3.55 MIL/uL — AB (ref 3.87–5.11)
RDW: 15.9 % — ABNORMAL HIGH (ref 11.5–15.5)
WBC: 18.5 10*3/uL — ABNORMAL HIGH (ref 4.0–10.5)

## 2017-11-15 LAB — I-STAT TROPONIN, ED
TROPONIN I, POC: 0.05 ng/mL (ref 0.00–0.08)
Troponin i, poc: 0.02 ng/mL (ref 0.00–0.08)

## 2017-11-15 LAB — LIPASE, BLOOD: LIPASE: 31 U/L (ref 11–51)

## 2017-11-15 MED ORDER — SODIUM CHLORIDE 0.9 % IV BOLUS
500.0000 mL | Freq: Once | INTRAVENOUS | Status: AC
  Administered 2017-11-15: 500 mL via INTRAVENOUS

## 2017-11-15 MED ORDER — HYDROCODONE-ACETAMINOPHEN 5-325 MG PO TABS: 1.0000 | ORAL_TABLET | ORAL | 0 refills | Status: DC | PRN

## 2017-11-15 MED ORDER — HYDROCODONE-ACETAMINOPHEN 5-325 MG PO TABS
1.0000 | ORAL_TABLET | Freq: Once | ORAL | Status: AC
  Administered 2017-11-15: 1 via ORAL
  Filled 2017-11-15: qty 1

## 2017-11-15 MED ORDER — IOPAMIDOL (ISOVUE-370) INJECTION 76%
50.0000 mL | Freq: Once | INTRAVENOUS | Status: AC | PRN
  Administered 2017-11-15: 50 mL via INTRAVENOUS

## 2017-11-15 NOTE — Discharge Instructions (Signed)
Take Hydrocodone as directed. Please obtain refills from your doctor Follow up with Dr. Mckinley Jewel
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
## 2017-11-15 NOTE — ED Triage Notes (Signed)
Pt transported from home by [facility] for c/o CP onset 15 min prior to EMS arrival, denies shob, denies n/v. 115/76, 103, 98, 18, 187 CBG

## 2017-11-15 NOTE — ED Notes (Signed)
Pt requesting to stay at [facility] until Monday when she is demanding to speak with Sharyne Richters.

## 2017-11-15 NOTE — ED Notes (Signed)
Pt very upset she she was not told about her lung cancer sooner, pt very difficult to keep on track d/t preoccupation with claims of malpractice regarding her cancer.  Pt also upset she is not given more Hydrocodone, pt admits she was given prescription 11/02/17 but has taken all of that prescription.

## 2017-11-16 ENCOUNTER — Telehealth: Payer: Self-pay | Admitting: *Deleted

## 2017-11-16 DIAGNOSIS — Z7902 Long term (current) use of antithrombotics/antiplatelets: Secondary | ICD-10-CM | POA: Diagnosis not present

## 2017-11-16 DIAGNOSIS — J439 Emphysema, unspecified: Secondary | ICD-10-CM | POA: Diagnosis not present

## 2017-11-16 DIAGNOSIS — I2510 Atherosclerotic heart disease of native coronary artery without angina pectoris: Secondary | ICD-10-CM | POA: Diagnosis not present

## 2017-11-16 DIAGNOSIS — H9190 Unspecified hearing loss, unspecified ear: Secondary | ICD-10-CM | POA: Diagnosis not present

## 2017-11-16 DIAGNOSIS — I129 Hypertensive chronic kidney disease with stage 1 through stage 4 chronic kidney disease, or unspecified chronic kidney disease: Secondary | ICD-10-CM | POA: Diagnosis not present

## 2017-11-16 DIAGNOSIS — Z86718 Personal history of other venous thrombosis and embolism: Secondary | ICD-10-CM | POA: Diagnosis not present

## 2017-11-16 DIAGNOSIS — I739 Peripheral vascular disease, unspecified: Secondary | ICD-10-CM | POA: Diagnosis not present

## 2017-11-16 DIAGNOSIS — N183 Chronic kidney disease, stage 3 (moderate): Secondary | ICD-10-CM | POA: Diagnosis not present

## 2017-11-16 DIAGNOSIS — H548 Legal blindness, as defined in USA: Secondary | ICD-10-CM | POA: Diagnosis not present

## 2017-11-16 DIAGNOSIS — I700 Atherosclerosis of aorta: Secondary | ICD-10-CM | POA: Diagnosis not present

## 2017-11-16 DIAGNOSIS — R918 Other nonspecific abnormal finding of lung field: Secondary | ICD-10-CM | POA: Diagnosis not present

## 2017-11-16 DIAGNOSIS — Z7982 Long term (current) use of aspirin: Secondary | ICD-10-CM | POA: Diagnosis not present

## 2017-11-16 DIAGNOSIS — Z87891 Personal history of nicotine dependence: Secondary | ICD-10-CM | POA: Diagnosis not present

## 2017-11-16 DIAGNOSIS — J8410 Pulmonary fibrosis, unspecified: Secondary | ICD-10-CM | POA: Diagnosis not present

## 2017-11-16 DIAGNOSIS — D631 Anemia in chronic kidney disease: Secondary | ICD-10-CM | POA: Diagnosis not present

## 2017-11-16 DIAGNOSIS — E785 Hyperlipidemia, unspecified: Secondary | ICD-10-CM | POA: Diagnosis not present

## 2017-11-16 DIAGNOSIS — M19012 Primary osteoarthritis, left shoulder: Secondary | ICD-10-CM | POA: Diagnosis not present

## 2017-11-16 LAB — URINE CULTURE

## 2017-11-16 NOTE — Telephone Encounter (Signed)
Oncology Nurse Navigator Documentation  Oncology Nurse Navigator Flowsheets 11/16/2017  Navigator Location [facility]  Navigator Encounter Type Telephone/I called [name] today to help her get MRI Brain scheduled. I was unable to reach her but left vm message with my name and phone number to call.   Treatment Phase Pre-Tx/Tx Discussion  Barriers/Navigation Needs Coordination of Care  Interventions Coordination of Care  Coordination of Care Other  Acuity Level 1  Time Spent with Patient 15

## 2017-11-17 ENCOUNTER — Inpatient Hospital Stay: Payer: Medicare Other

## 2017-11-17 ENCOUNTER — Other Ambulatory Visit: Payer: Self-pay | Admitting: Internal Medicine

## 2017-11-17 ENCOUNTER — Telehealth: Payer: Self-pay | Admitting: Internal Medicine

## 2017-11-17 ENCOUNTER — Inpatient Hospital Stay: Payer: Medicare Other | Attending: Internal Medicine

## 2017-11-17 VITALS — BP 121/62 | HR 79 | Temp 97.9°F | Resp 18

## 2017-11-17 DIAGNOSIS — Z5111 Encounter for antineoplastic chemotherapy: Secondary | ICD-10-CM | POA: Diagnosis not present

## 2017-11-17 DIAGNOSIS — C3411 Malignant neoplasm of upper lobe, right bronchus or lung: Secondary | ICD-10-CM | POA: Insufficient documentation

## 2017-11-17 DIAGNOSIS — R5382 Chronic fatigue, unspecified: Secondary | ICD-10-CM

## 2017-11-17 DIAGNOSIS — C3491 Malignant neoplasm of unspecified part of right bronchus or lung: Secondary | ICD-10-CM

## 2017-11-17 DIAGNOSIS — Z7689 Persons encountering health services in other specified circumstances: Secondary | ICD-10-CM | POA: Diagnosis not present

## 2017-11-17 DIAGNOSIS — R590 Localized enlarged lymph nodes: Secondary | ICD-10-CM | POA: Insufficient documentation

## 2017-11-17 DIAGNOSIS — Z79899 Other long term (current) drug therapy: Secondary | ICD-10-CM | POA: Diagnosis not present

## 2017-11-17 LAB — CMP (CANCER CENTER ONLY)
ALT: 25 U/L (ref 0–44)
AST: 22 U/L (ref 15–41)
Albumin: 2.5 g/dL — ABNORMAL LOW (ref 3.5–5.0)
Alkaline Phosphatase: 85 U/L (ref 38–126)
Anion gap: 8 (ref 5–15)
BUN: 34 mg/dL — ABNORMAL HIGH (ref 8–23)
CHLORIDE: 104 mmol/L (ref 98–111)
CO2: 29 mmol/L (ref 22–32)
CREATININE: 1.54 mg/dL — AB (ref 0.44–1.00)
Calcium: 10.1 mg/dL (ref 8.9–10.3)
GFR, EST AFRICAN AMERICAN: 36 mL/min — AB (ref >60)
GFR, Estimated: 31 mL/min — ABNORMAL LOW (ref >60)
Glucose, Bld: 105 mg/dL — ABNORMAL HIGH (ref 70–99)
POTASSIUM: 4.7 mmol/L (ref 3.5–5.1)
SODIUM: 141 mmol/L (ref 135–145)
Total Bilirubin: 0.3 mg/dL (ref 0.3–1.2)
Total Protein: 6.7 g/dL (ref 6.5–8.1)

## 2017-11-17 LAB — CBC WITH DIFFERENTIAL (CANCER CENTER ONLY)
Basophils Absolute: 0.1 10*3/uL (ref 0.0–0.1)
Basophils Relative: 0 %
EOS ABS: 0.2 10*3/uL (ref 0.0–0.5)
Eosinophils Relative: 1 %
HCT: 31.9 % — ABNORMAL LOW (ref 34.8–46.6)
HEMOGLOBIN: 10 g/dL — AB (ref 11.6–15.9)
LYMPHS ABS: 2 10*3/uL (ref 0.9–3.3)
LYMPHS PCT: 11 %
MCH: 28.7 pg (ref 25.1–34.0)
MCHC: 31.3 g/dL — ABNORMAL LOW (ref 31.5–36.0)
MCV: 91.7 fL (ref 79.5–101.0)
Monocytes Absolute: 1.4 10*3/uL — ABNORMAL HIGH (ref 0.1–0.9)
Monocytes Relative: 8 %
NEUTROS ABS: 14.7 10*3/uL — AB (ref 1.5–6.5)
NEUTROS PCT: 80 %
Platelet Count: 405 10*3/uL — ABNORMAL HIGH (ref 145–400)
RBC: 3.48 MIL/uL — AB (ref 3.70–5.45)
RDW: 16.4 % — ABNORMAL HIGH (ref 11.2–14.5)
WBC: 18.4 10*3/uL — AB (ref 3.9–10.3)

## 2017-11-17 LAB — TSH: TSH: 6.94 u[IU]/mL — ABNORMAL HIGH (ref 0.308–3.960)

## 2017-11-17 MED ORDER — DIPHENHYDRAMINE HCL 50 MG/ML IJ SOLN
INTRAMUSCULAR | Status: AC
  Filled 2017-11-17: qty 1

## 2017-11-17 MED ORDER — PEGFILGRASTIM 6 MG/0.6ML ~~LOC~~ PSKT
6.0000 mg | PREFILLED_SYRINGE | Freq: Once | SUBCUTANEOUS | Status: AC
  Administered 2017-11-17: 6 mg via SUBCUTANEOUS

## 2017-11-17 MED ORDER — PALONOSETRON HCL INJECTION 0.25 MG/5ML
INTRAVENOUS | Status: AC
  Filled 2017-11-17: qty 5

## 2017-11-17 MED ORDER — SODIUM CHLORIDE 0.9 % IV SOLN
200.0000 mg | Freq: Once | INTRAVENOUS | Status: AC
  Administered 2017-11-17: 200 mg via INTRAVENOUS
  Filled 2017-11-17: qty 8

## 2017-11-17 MED ORDER — CARBOPLATIN CHEMO INJECTION 450 MG/45ML
273.0000 mg | Freq: Once | INTRAVENOUS | Status: AC
  Administered 2017-11-17: 270 mg via INTRAVENOUS
  Filled 2017-11-17: qty 27

## 2017-11-17 MED ORDER — OXYCODONE-ACETAMINOPHEN 5-325 MG PO TABS
ORAL_TABLET | ORAL | Status: AC
  Filled 2017-11-17: qty 1

## 2017-11-17 MED ORDER — PEGFILGRASTIM 6 MG/0.6ML ~~LOC~~ PSKT
PREFILLED_SYRINGE | SUBCUTANEOUS | Status: AC
  Filled 2017-11-17: qty 0.6

## 2017-11-17 MED ORDER — SODIUM CHLORIDE 0.9 % IV SOLN
Freq: Once | INTRAVENOUS | Status: AC
  Administered 2017-11-17: 12:00:00 via INTRAVENOUS

## 2017-11-17 MED ORDER — SODIUM CHLORIDE 0.9 % IV SOLN
Freq: Once | INTRAVENOUS | Status: AC
  Administered 2017-11-17: 12:00:00 via INTRAVENOUS
  Filled 2017-11-17: qty 5

## 2017-11-17 MED ORDER — FAMOTIDINE IN NACL 20-0.9 MG/50ML-% IV SOLN
20.0000 mg | Freq: Once | INTRAVENOUS | Status: AC
  Administered 2017-11-17: 20 mg via INTRAVENOUS

## 2017-11-17 MED ORDER — SODIUM CHLORIDE 0.9 % IV SOLN
175.0000 mg/m2 | Freq: Once | INTRAVENOUS | Status: AC
  Administered 2017-11-17: 282 mg via INTRAVENOUS
  Filled 2017-11-17: qty 47

## 2017-11-17 MED ORDER — DIPHENHYDRAMINE HCL 50 MG/ML IJ SOLN
50.0000 mg | Freq: Once | INTRAMUSCULAR | Status: AC
  Administered 2017-11-17: 50 mg via INTRAVENOUS

## 2017-11-17 MED ORDER — OXYCODONE-ACETAMINOPHEN 5-325 MG PO TABS
1.0000 | ORAL_TABLET | Freq: Once | ORAL | Status: AC
  Administered 2017-11-17: 1 via ORAL

## 2017-11-17 MED ORDER — FAMOTIDINE IN NACL 20-0.9 MG/50ML-% IV SOLN
INTRAVENOUS | Status: AC
  Filled 2017-11-17: qty 50

## 2017-11-17 MED ORDER — PALONOSETRON HCL INJECTION 0.25 MG/5ML
0.2500 mg | Freq: Once | INTRAVENOUS | Status: AC
  Administered 2017-11-17: 0.25 mg via INTRAVENOUS

## 2017-11-17 NOTE — Telephone Encounter (Addendum)
Rx for 10 tabs written by ED provider on 11/15/2017. Print option selected. Hubbard Hartshorn, RN, BSN

## 2017-11-17 NOTE — Telephone Encounter (Signed)
Needs refills on hydrocodone @ [facility]- pt asked can she please get medicine today, she is in a lot of pain. Pt contact# [phone]

## 2017-11-17 NOTE — Patient Instructions (Addendum)
[facility] Discharge Instructions for Patients Receiving Chemotherapy  Today you received the following chemotherapy agents Keytruda, Taxol, Carboplatin  To help prevent nausea and vomiting after your treatment, we encourage you to take your nausea medication as directed   If you develop nausea and vomiting that is not controlled by your nausea medication, call the clinic.   BELOW ARE SYMPTOMS THAT SHOULD BE REPORTED IMMEDIATELY:  *FEVER GREATER THAN 100.5 F  *CHILLS WITH OR WITHOUT FEVER  NAUSEA AND VOMITING THAT IS NOT CONTROLLED WITH YOUR NAUSEA MEDICATION  *UNUSUAL SHORTNESS OF BREATH  *UNUSUAL BRUISING OR BLEEDING  TENDERNESS IN MOUTH AND THROAT WITH OR WITHOUT PRESENCE OF ULCERS  *URINARY PROBLEMS  *BOWEL PROBLEMS  UNUSUAL RASH Items with * indicate a potential emergency and should be followed up as soon as possible.  Feel free to call the clinic should you have any questions or concerns. The clinic phone number is [phone].  Please show the [facility] at check-in to the Emergency Department and triage nurse.   Pembrolizumab injection What is this medicine? PEMBROLIZUMAB (pem broe liz ue mab) is a monoclonal antibody. It is used to treat melanoma, head and neck cancer, Hodgkin lymphoma, non-small cell lung cancer, urothelial cancer, stomach cancer, and cancers that have a certain genetic condition. This medicine may be used for other purposes; ask your health care provider or pharmacist if you have questions. COMMON BRAND NAME(S): Keytruda What should I tell my health care provider before I take this medicine? They need to know if you have any of these conditions: -diabetes -immune system problems -inflammatory bowel disease -liver disease -lung or breathing disease -lupus -organ transplant -an unusual or allergic reaction to pembrolizumab, other medicines, foods, dyes, or preservatives -pregnant or trying to get  pregnant -breast-feeding How should I use this medicine? This medicine is for infusion into a vein. It is given by a health care professional in a hospital or clinic setting. A special MedGuide will be given to you before each treatment. Be sure to read this information carefully each time. Talk to your pediatrician regarding the use of this medicine in children. While this drug may be prescribed for selected conditions, precautions do apply. Overdosage: If you think you have taken too much of this medicine contact a poison control center or emergency room at once. NOTE: This medicine is only for you. Do not share this medicine with others. What if I miss a dose? It is important not to miss your dose. Call your doctor or health care professional if you are unable to keep an appointment. What may interact with this medicine? Interactions have not been studied. Give your health care provider a list of all the medicines, herbs, non-prescription drugs, or dietary supplements you use. Also tell them if you smoke, drink alcohol, or use illegal drugs. Some items may interact with your medicine. This list may not describe all possible interactions. Give your health care provider a list of all the medicines, herbs, non-prescription drugs, or dietary supplements you use. Also tell them if you smoke, drink alcohol, or use illegal drugs. Some items may interact with your medicine. What should I watch for while using this medicine? Your condition will be monitored carefully while you are receiving this medicine. You may need blood work done while you are taking this medicine. Do not become pregnant while taking this medicine or for 4 months after stopping it. Women should inform their doctor if they wish to become pregnant or  think they might be pregnant. There is a potential for serious side effects to an unborn child. Talk to your health care professional or pharmacist for more information. Do not breast-feed  an infant while taking this medicine or for 4 months after the last dose. What side effects may I notice from receiving this medicine? Side effects that you should report to your doctor or health care professional as soon as possible: -allergic reactions like skin rash, itching or hives, swelling of the face, lips, or tongue -bloody or black, tarry -breathing problems -changes in vision -chest pain -chills -constipation -cough -dizziness or feeling faint or lightheaded -fast or irregular heartbeat -fever -flushing -hair loss -low blood counts - this medicine may decrease the number of white blood cells, red blood cells and platelets. You may be at increased risk for infections and bleeding. -muscle pain -muscle weakness -persistent headache -signs and symptoms of high blood sugar such as dizziness; dry mouth; dry skin; fruity breath; nausea; stomach pain; increased hunger or thirst; increased urination -signs and symptoms of kidney injury like trouble passing urine or change in the amount of urine -signs and symptoms of liver injury like dark urine, light-colored stools, loss of appetite, nausea, right upper belly pain, yellowing of the eyes or skin -stomach pain -sweating -weight loss Side effects that usually do not require medical attention (report to your doctor or health care professional if they continue or are bothersome): -decreased appetite -diarrhea -tiredness This list may not describe all possible side effects. Call your doctor for medical advice about side effects. You may report side effects to FDA at 1-800-FDA-1088. Where should I keep my medicine? This drug is given in a hospital or clinic and will not be stored at home. NOTE: This sheet is a summary. It may not cover all possible information. If you have questions about this medicine, talk to your doctor, pharmacist, or health care provider.  2018 Elsevier/Gold Standard (2016-02-05 12:29:36)    Paclitaxel  injection What is this medicine? PACLITAXEL (PAK li TAX el) is a chemotherapy drug. It targets fast dividing cells, like cancer cells, and causes these cells to die. This medicine is used to treat ovarian cancer, breast cancer, and other cancers. This medicine may be used for other purposes; ask your health care provider or pharmacist if you have questions. COMMON BRAND NAME(S): Onxol, Taxol What should I tell my health care provider before I take this medicine? They need to know if you have any of these conditions: -blood disorders -irregular heartbeat -infection (especially a virus infection such as chickenpox, cold sores, or herpes) -liver disease -previous or ongoing radiation therapy -an unusual or allergic reaction to paclitaxel, alcohol, polyoxyethylated castor oil, other chemotherapy agents, other medicines, foods, dyes, or preservatives -pregnant or trying to get pregnant -breast-feeding How should I use this medicine? This drug is given as an infusion into a vein. It is administered in a hospital or clinic by a specially trained health care professional. Talk to your pediatrician regarding the use of this medicine in children. Special care may be needed. Overdosage: If you think you have taken too much of this medicine contact a poison control center or emergency room at once. NOTE: This medicine is only for you. Do not share this medicine with others. What if I miss a dose? It is important not to miss your dose. Call your doctor or health care professional if you are unable to keep an appointment. What may interact with this medicine? Do not take  this medicine with any of the following medications: -disulfiram -metronidazole This medicine may also interact with the following medications: -cyclosporine -diazepam -ketoconazole -medicines to increase blood counts like filgrastim, pegfilgrastim, sargramostim -other chemotherapy drugs like cisplatin, doxorubicin, epirubicin,  etoposide, teniposide, vincristine -quinidine -testosterone -vaccines -verapamil Talk to your doctor or health care professional before taking any of these medicines: -acetaminophen -aspirin -ibuprofen -ketoprofen -naproxen This list may not describe all possible interactions. Give your health care provider a list of all the medicines, herbs, non-prescription drugs, or dietary supplements you use. Also tell them if you smoke, drink alcohol, or use illegal drugs. Some items may interact with your medicine. What should I watch for while using this medicine? Your condition will be monitored carefully while you are receiving this medicine. You will need important blood work done while you are taking this medicine. This medicine can cause serious allergic reactions. To reduce your risk you will need to take other medicine(s) before treatment with this medicine. If you experience allergic reactions like skin rash, itching or hives, swelling of the face, lips, or tongue, tell your doctor or health care professional right away. In some cases, you may be given additional medicines to help with side effects. Follow all directions for their use. This drug may make you feel generally unwell. This is not uncommon, as chemotherapy can affect healthy cells as well as cancer cells. Report any side effects. Continue your course of treatment even though you feel ill unless your doctor tells you to stop. Call your doctor or health care professional for advice if you get a fever, chills or sore throat, or other symptoms of a cold or flu. Do not treat yourself. This drug decreases your body's ability to fight infections. Try to avoid being around people who are sick. This medicine may increase your risk to bruise or bleed. Call your doctor or health care professional if you notice any unusual bleeding. Be careful brushing and flossing your teeth or using a toothpick because you may get an infection or bleed more  easily. If you have any dental work done, tell your dentist you are receiving this medicine. Avoid taking products that contain aspirin, acetaminophen, ibuprofen, naproxen, or ketoprofen unless instructed by your doctor. These medicines may hide a fever. Do not become pregnant while taking this medicine. Women should inform their doctor if they wish to become pregnant or think they might be pregnant. There is a potential for serious side effects to an unborn child. Talk to your health care professional or pharmacist for more information. Do not breast-feed an infant while taking this medicine. Men are advised not to father a child while receiving this medicine. This product may contain alcohol. Ask your pharmacist or healthcare provider if this medicine contains alcohol. Be sure to tell all healthcare providers you are taking this medicine. Certain medicines, like metronidazole and disulfiram, can cause an unpleasant reaction when taken with alcohol. The reaction includes flushing, headache, nausea, vomiting, sweating, and increased thirst. The reaction can last from 30 minutes to several hours. What side effects may I notice from receiving this medicine? Side effects that you should report to your doctor or health care professional as soon as possible: -allergic reactions like skin rash, itching or hives, swelling of the face, lips, or tongue -low blood counts - This drug may decrease the number of white blood cells, red blood cells and platelets. You may be at increased risk for infections and bleeding. -signs of infection - fever or chills,  cough, sore throat, pain or difficulty passing urine -signs of decreased platelets or bleeding - bruising, pinpoint red spots on the skin, black, tarry stools, nosebleeds -signs of decreased red blood cells - unusually weak or tired, fainting spells, lightheadedness -breathing problems -chest pain -high or low blood pressure -mouth sores -nausea and  vomiting -pain, swelling, redness or irritation at the injection site -pain, tingling, numbness in the hands or feet -slow or irregular heartbeat -swelling of the ankle, feet, hands Side effects that usually do not require medical attention (report to your doctor or health care professional if they continue or are bothersome): -bone pain -complete hair loss including hair on your head, underarms, pubic hair, eyebrows, and eyelashes -changes in the color of fingernails -diarrhea -loosening of the fingernails -loss of appetite -muscle or joint pain -red flush to skin -sweating This list may not describe all possible side effects. Call your doctor for medical advice about side effects. You may report side effects to FDA at 1-800-FDA-1088. Where should I keep my medicine? This drug is given in a hospital or clinic and will not be stored at home. NOTE: This sheet is a summary. It may not cover all possible information. If you have questions about this medicine, talk to your doctor, pharmacist, or health care provider.  2018 Elsevier/Gold Standard (2015-02-27 19:58:00).   Carboplatin injection What is this medicine? CARBOPLATIN (KAR boe pla tin) is a chemotherapy drug. It targets fast dividing cells, like cancer cells, and causes these cells to die. This medicine is used to treat ovarian cancer and many other cancers. This medicine may be used for other purposes; ask your health care provider or pharmacist if you have questions. COMMON BRAND NAME(S): Paraplatin What should I tell my health care provider before I take this medicine? They need to know if you have any of these conditions: -blood disorders -hearing problems -kidney disease -recent or ongoing radiation therapy -an unusual or allergic reaction to carboplatin, cisplatin, other chemotherapy, other medicines, foods, dyes, or preservatives -pregnant or trying to get pregnant -breast-feeding How should I use this medicine? This  drug is usually given as an infusion into a vein. It is administered in a hospital or clinic by a specially trained health care professional. Talk to your pediatrician regarding the use of this medicine in children. Special care may be needed. Overdosage: If you think you have taken too much of this medicine contact a poison control center or emergency room at once. NOTE: This medicine is only for you. Do not share this medicine with others. What if I miss a dose? It is important not to miss a dose. Call your doctor or health care professional if you are unable to keep an appointment. What may interact with this medicine? -medicines for seizures -medicines to increase blood counts like filgrastim, pegfilgrastim, sargramostim -some antibiotics like amikacin, gentamicin, neomycin, streptomycin, tobramycin -vaccines Talk to your doctor or health care professional before taking any of these medicines: -acetaminophen -aspirin -ibuprofen -ketoprofen -naproxen This list may not describe all possible interactions. Give your health care provider a list of all the medicines, herbs, non-prescription drugs, or dietary supplements you use. Also tell them if you smoke, drink alcohol, or use illegal drugs. Some items may interact with your medicine. What should I watch for while using this medicine? Your condition will be monitored carefully while you are receiving this medicine. You will need important blood work done while you are taking this medicine. This drug may make you feel generally  unwell. This is not uncommon, as chemotherapy can affect healthy cells as well as cancer cells. Report any side effects. Continue your course of treatment even though you feel ill unless your doctor tells you to stop. In some cases, you may be given additional medicines to help with side effects. Follow all directions for their use. Call your doctor or health care professional for advice if you get a fever, chills or sore  throat, or other symptoms of a cold or flu. Do not treat yourself. This drug decreases your body's ability to fight infections. Try to avoid being around people who are sick. This medicine may increase your risk to bruise or bleed. Call your doctor or health care professional if you notice any unusual bleeding. Be careful brushing and flossing your teeth or using a toothpick because you may get an infection or bleed more easily. If you have any dental work done, tell your dentist you are receiving this medicine. Avoid taking products that contain aspirin, acetaminophen, ibuprofen, naproxen, or ketoprofen unless instructed by your doctor. These medicines may hide a fever. Do not become pregnant while taking this medicine. Women should inform their doctor if they wish to become pregnant or think they might be pregnant. There is a potential for serious side effects to an unborn child. Talk to your health care professional or pharmacist for more information. Do not breast-feed an infant while taking this medicine. What side effects may I notice from receiving this medicine? Side effects that you should report to your doctor or health care professional as soon as possible: -allergic reactions like skin rash, itching or hives, swelling of the face, lips, or tongue -signs of infection - fever or chills, cough, sore throat, pain or difficulty passing urine -signs of decreased platelets or bleeding - bruising, pinpoint red spots on the skin, black, tarry stools, nosebleeds -signs of decreased red blood cells - unusually weak or tired, fainting spells, lightheadedness -breathing problems -changes in hearing -changes in vision -chest pain -high blood pressure -low blood counts - This drug may decrease the number of white blood cells, red blood cells and platelets. You may be at increased risk for infections and bleeding. -nausea and vomiting -pain, swelling, redness or irritation at the injection site -pain,  tingling, numbness in the hands or feet -problems with balance, talking, walking -trouble passing urine or change in the amount of urine Side effects that usually do not require medical attention (report to your doctor or health care professional if they continue or are bothersome): -hair loss -loss of appetite -metallic taste in the mouth or changes in taste This list may not describe all possible side effects. Call your doctor for medical advice about side effects. You may report side effects to FDA at 1-800-FDA-1088. Where should I keep my medicine? This drug is given in a hospital or clinic and will not be stored at home. NOTE: This sheet is a summary. It may not cover all possible information. If you have questions about this medicine, talk to your doctor, pharmacist, or health care provider.  2018 Elsevier/Gold Standard (2007-08-03 14:38:05).   Pegfilgrastim injection What is this medicine? PEGFILGRASTIM (PEG fil gra stim) is a long-acting granulocyte colony-stimulating factor that stimulates the growth of neutrophils, a type of white blood cell important in the body's fight against infection. It is used to reduce the incidence of fever and infection in patients with certain types of cancer who are receiving chemotherapy that affects the bone marrow, and to increase  survival after being exposed to high doses of radiation. This medicine may be used for other purposes; ask your health care provider or pharmacist if you have questions. COMMON BRAND NAME(S): Neulasta What should I tell my health care provider before I take this medicine? They need to know if you have any of these conditions: -kidney disease -latex allergy -ongoing radiation therapy -sickle cell disease -skin reactions to acrylic adhesives (On-Body Injector only) -an unusual or allergic reaction to pegfilgrastim, filgrastim, other medicines, foods, dyes, or preservatives -pregnant or trying to get  pregnant -breast-feeding How should I use this medicine? This medicine is for injection under the skin. If you get this medicine at home, you will be taught how to prepare and give the pre-filled syringe or how to use the On-body Injector. Refer to the patient Instructions for Use for detailed instructions. Use exactly as directed. Tell your healthcare provider immediately if you suspect that the On-body Injector may not have performed as intended or if you suspect the use of the On-body Injector resulted in a missed or partial dose. It is important that you put your used needles and syringes in a special sharps container. Do not put them in a trash can. If you do not have a sharps container, call your pharmacist or healthcare provider to get one. Talk to your pediatrician regarding the use of this medicine in children. While this drug may be prescribed for selected conditions, precautions do apply. Overdosage: If you think you have taken too much of this medicine contact a poison control center or emergency room at once. NOTE: This medicine is only for you. Do not share this medicine with others. What if I miss a dose? It is important not to miss your dose. Call your doctor or health care professional if you miss your dose. If you miss a dose due to an On-body Injector failure or leakage, a new dose should be administered as soon as possible using a single prefilled syringe for manual use. What may interact with this medicine? Interactions have not been studied. Give your health care provider a list of all the medicines, herbs, non-prescription drugs, or dietary supplements you use. Also tell them if you smoke, drink alcohol, or use illegal drugs. Some items may interact with your medicine. This list may not describe all possible interactions. Give your health care provider a list of all the medicines, herbs, non-prescription drugs, or dietary supplements you use. Also tell them if you smoke, drink  alcohol, or use illegal drugs. Some items may interact with your medicine. What should I watch for while using this medicine? You may need blood work done while you are taking this medicine. If you are going to need a MRI, CT scan, or other procedure, tell your doctor that you are using this medicine (On-Body Injector only). What side effects may I notice from receiving this medicine? Side effects that you should report to your doctor or health care professional as soon as possible: -allergic reactions like skin rash, itching or hives, swelling of the face, lips, or tongue -dizziness -fever -pain, redness, or irritation at site where injected -pinpoint red spots on the skin -red or dark-brown urine -shortness of breath or breathing problems -stomach or side pain, or pain at the shoulder -swelling -tiredness -trouble passing urine or change in the amount of urine Side effects that usually do not require medical attention (report to your doctor or health care professional if they continue or are bothersome): -bone pain -muscle  pain This list may not describe all possible side effects. Call your doctor for medical advice about side effects. You may report side effects to FDA at 1-800-FDA-1088. Where should I keep my medicine? Keep out of the reach of children. Store pre-filled syringes in a refrigerator between 2 and 8 degrees C (36 and 46 degrees F). Do not freeze. Keep in carton to protect from light. Throw away this medicine if it is left out of the refrigerator for more than 48 hours. Throw away any unused medicine after the expiration date. NOTE: This sheet is a summary. It may not cover all possible information. If you have questions about this medicine, talk to your doctor, pharmacist, or health care provider.  2018 Elsevier/Gold Standard (2016-04-24 12:58:03).

## 2017-11-17 NOTE — Progress Notes (Signed)
Discharge instructions given and reviewed with the pt and family member and they both verbalized understanding.

## 2017-11-18 ENCOUNTER — Emergency Department (HOSPITAL_COMMUNITY): Payer: Medicare Other

## 2017-11-18 ENCOUNTER — Encounter: Payer: Self-pay | Admitting: Internal Medicine

## 2017-11-18 ENCOUNTER — Emergency Department (HOSPITAL_COMMUNITY)
Admission: EM | Admit: 2017-11-18 | Discharge: 2017-11-18 | Disposition: A | Discharge status: Home / Self Care | Payer: Medicare Other | Attending: Emergency Medicine | Admitting: Emergency Medicine

## 2017-11-18 DIAGNOSIS — K644 Residual hemorrhoidal skin tags: Secondary | ICD-10-CM

## 2017-11-18 DIAGNOSIS — K649 Unspecified hemorrhoids: Secondary | ICD-10-CM | POA: Insufficient documentation

## 2017-11-18 DIAGNOSIS — K5900 Constipation, unspecified: Secondary | ICD-10-CM | POA: Insufficient documentation

## 2017-11-18 DIAGNOSIS — N183 Chronic kidney disease, stage 3 (moderate): Secondary | ICD-10-CM | POA: Diagnosis not present

## 2017-11-18 DIAGNOSIS — R52 Pain, unspecified: Secondary | ICD-10-CM | POA: Diagnosis not present

## 2017-11-18 DIAGNOSIS — R1111 Vomiting without nausea: Secondary | ICD-10-CM | POA: Diagnosis not present

## 2017-11-18 DIAGNOSIS — Z79899 Other long term (current) drug therapy: Secondary | ICD-10-CM | POA: Insufficient documentation

## 2017-11-18 DIAGNOSIS — I129 Hypertensive chronic kidney disease with stage 1 through stage 4 chronic kidney disease, or unspecified chronic kidney disease: Secondary | ICD-10-CM | POA: Diagnosis not present

## 2017-11-18 DIAGNOSIS — J449 Chronic obstructive pulmonary disease, unspecified: Secondary | ICD-10-CM | POA: Diagnosis not present

## 2017-11-18 DIAGNOSIS — Z87891 Personal history of nicotine dependence: Secondary | ICD-10-CM | POA: Diagnosis not present

## 2017-11-18 DIAGNOSIS — K5903 Drug induced constipation: Secondary | ICD-10-CM | POA: Diagnosis not present

## 2017-11-18 DIAGNOSIS — K6289 Other specified diseases of anus and rectum: Secondary | ICD-10-CM | POA: Diagnosis not present

## 2017-11-18 MED ORDER — POLYETHYLENE GLYCOL 3350 17 G PO PACK
17.0000 g | PACK | Freq: Every day | ORAL | Status: DC
  Administered 2017-11-18: 17 g via ORAL
  Filled 2017-11-18: qty 1

## 2017-11-18 MED ORDER — HYDROCODONE-ACETAMINOPHEN 5-325 MG PO TABS: 1.0000 | ORAL_TABLET | ORAL | 0 refills | Status: DC | PRN

## 2017-11-18 MED ORDER — HYDROCORTISONE ACETATE 25 MG RE SUPP: 25.0000 mg | Freq: Two times a day (BID) | RECTAL | 0 refills | Status: DC

## 2017-11-18 MED ORDER — POLYETHYLENE GLYCOL 3350 17 G PO PACK: 17.0000 g | PACK | Freq: Every day | ORAL | 0 refills | Status: AC

## 2017-11-18 NOTE — ED Notes (Signed)
Pt has hemorrhoids present on her rectum. She is c/o pain there and her abdomen.

## 2017-11-18 NOTE — ED Triage Notes (Signed)
Pt has recently been diagnosed with stage 4 Lung cancer.

## 2017-11-18 NOTE — Telephone Encounter (Signed)
I sent in a new Rx.  Please have her call the clinic if she runs out prior to 1 month.  She should take the medication for chest pain and foot pain.   Thank you  Gilles Chiquito, MD

## 2017-11-18 NOTE — Telephone Encounter (Signed)
Called pt - no answer; left message to call the clinic.

## 2017-11-18 NOTE — Progress Notes (Signed)
Received staff message regarding patient having financial questions and concerns.  Called patient to introduce myself as Arboriculturist and to address her concerns. She states she has bills and medications to pay for. Advised patient she may apply for the one-time $400 [place] and she would need to bring proof of income(social security) which could either be bank statement or award letter. Asked patient if she could bring tomorrow at 3pm when she comes for her flush. Patient asked if she had an appointment tomorrow because she has to arrange transportation with [facility]. Reviewed appointment desk and confirmed she does have an appointment tomorrow. Advised patient again what to bring tomorrow and that I would see her before that appointment. She verbalized understanding and has my name to ask for when she comes.

## 2017-11-18 NOTE — Telephone Encounter (Signed)
Spoke with patient, states she picked up 10 tabs of hydrocodone written 11/15/2017 by ED provider. She is unsure how many tabs she has left. C/o pain in back and chest. Difficult to hear patient as there are children yelling in background. Attempted to schedule patient ED f/u appt. Patient unwilling at this time as she doesn't want to pay for "all these appts." Patient states she will call back later when the children are gone. Hubbard Hartshorn, RN, BSN
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
## 2017-11-18 NOTE — ED Notes (Signed)
Bed: GG26 Expected date:  Expected time:  Means of arrival:  Comments: EMS/shob

## 2017-11-18 NOTE — ED Triage Notes (Signed)
Pt comes from home. Pt is AOx4 and ambulatory. Pt called 911 due to constipation for 14 days. Pt is also complaining of black tarry stool and Hemorids . No abdominal pain. No N/V/D.

## 2017-11-19 ENCOUNTER — Encounter (HOSPITAL_COMMUNITY): Payer: Self-pay | Admitting: Obstetrics and Gynecology

## 2017-11-19 ENCOUNTER — Other Ambulatory Visit: Payer: Self-pay

## 2017-11-19 ENCOUNTER — Other Ambulatory Visit: Payer: Self-pay | Admitting: *Deleted

## 2017-11-19 ENCOUNTER — Emergency Department (HOSPITAL_COMMUNITY)
Admission: EM | Admit: 2017-11-19 | Discharge: 2017-11-20 | Disposition: A | Discharge status: Home / Self Care | Payer: Medicare Other | Source: Home / Self Care | Attending: Emergency Medicine | Admitting: Emergency Medicine

## 2017-11-19 ENCOUNTER — Emergency Department (HOSPITAL_COMMUNITY): Payer: Medicare Other

## 2017-11-19 ENCOUNTER — Other Ambulatory Visit: Payer: Self-pay | Admitting: Licensed Clinical Social Worker

## 2017-11-19 ENCOUNTER — Emergency Department (HOSPITAL_COMMUNITY)
Admission: EM | Admit: 2017-11-19 | Discharge: 2017-11-19 | Disposition: A | Discharge status: Home / Self Care | Payer: Medicare Other | Attending: Emergency Medicine | Admitting: Emergency Medicine

## 2017-11-19 ENCOUNTER — Inpatient Hospital Stay: Payer: Medicare Other

## 2017-11-19 DIAGNOSIS — J449 Chronic obstructive pulmonary disease, unspecified: Secondary | ICD-10-CM | POA: Insufficient documentation

## 2017-11-19 DIAGNOSIS — N183 Chronic kidney disease, stage 3 (moderate): Secondary | ICD-10-CM

## 2017-11-19 DIAGNOSIS — Z7902 Long term (current) use of antithrombotics/antiplatelets: Secondary | ICD-10-CM | POA: Insufficient documentation

## 2017-11-19 DIAGNOSIS — K5900 Constipation, unspecified: Secondary | ICD-10-CM | POA: Insufficient documentation

## 2017-11-19 DIAGNOSIS — Z87891 Personal history of nicotine dependence: Secondary | ICD-10-CM | POA: Insufficient documentation

## 2017-11-19 DIAGNOSIS — K5641 Fecal impaction: Secondary | ICD-10-CM | POA: Insufficient documentation

## 2017-11-19 DIAGNOSIS — Z79899 Other long term (current) drug therapy: Secondary | ICD-10-CM | POA: Insufficient documentation

## 2017-11-19 DIAGNOSIS — N361 Urethral diverticulum: Secondary | ICD-10-CM | POA: Diagnosis not present

## 2017-11-19 DIAGNOSIS — I129 Hypertensive chronic kidney disease with stage 1 through stage 4 chronic kidney disease, or unspecified chronic kidney disease: Secondary | ICD-10-CM | POA: Insufficient documentation

## 2017-11-19 DIAGNOSIS — R52 Pain, unspecified: Secondary | ICD-10-CM | POA: Diagnosis not present

## 2017-11-19 DIAGNOSIS — R109 Unspecified abdominal pain: Secondary | ICD-10-CM | POA: Diagnosis not present

## 2017-11-19 DIAGNOSIS — R1084 Generalized abdominal pain: Secondary | ICD-10-CM | POA: Diagnosis not present

## 2017-11-19 DIAGNOSIS — Z85118 Personal history of other malignant neoplasm of bronchus and lung: Secondary | ICD-10-CM | POA: Insufficient documentation

## 2017-11-19 LAB — COMPREHENSIVE METABOLIC PANEL
ALT: 30 U/L (ref 0–44)
AST: 37 U/L (ref 15–41)
Albumin: 2.4 g/dL — ABNORMAL LOW (ref 3.5–5.0)
Alkaline Phosphatase: 69 U/L (ref 38–126)
Anion gap: 7 (ref 5–15)
BUN: 37 mg/dL — AB (ref 8–23)
CHLORIDE: 110 mmol/L (ref 98–111)
CO2: 26 mmol/L (ref 22–32)
Calcium: 8.5 mg/dL — ABNORMAL LOW (ref 8.9–10.3)
Creatinine, Ser: 1.3 mg/dL — ABNORMAL HIGH (ref 0.44–1.00)
GFR calc Af Amer: 45 mL/min — ABNORMAL LOW (ref >60)
GFR, EST NON AFRICAN AMERICAN: 39 mL/min — AB (ref >60)
Glucose, Bld: 113 mg/dL — ABNORMAL HIGH (ref 70–99)
POTASSIUM: 4.2 mmol/L (ref 3.5–5.1)
Sodium: 143 mmol/L (ref 135–145)
Total Bilirubin: 0.5 mg/dL (ref 0.3–1.2)
Total Protein: 5.7 g/dL — ABNORMAL LOW (ref 6.5–8.1)

## 2017-11-19 LAB — CBC WITH DIFFERENTIAL/PLATELET
Basophils Absolute: 0 10*3/uL (ref 0.0–0.1)
Basophils Relative: 0 %
EOS PCT: 0 %
Eosinophils Absolute: 0 10*3/uL (ref 0.0–0.7)
HCT: 31.4 % — ABNORMAL LOW (ref 36.0–46.0)
HEMOGLOBIN: 10.1 g/dL — AB (ref 12.0–15.0)
LYMPHS ABS: 1.2 10*3/uL (ref 0.7–4.0)
LYMPHS PCT: 2 %
MCH: 29.4 pg (ref 26.0–34.0)
MCHC: 32.2 g/dL (ref 30.0–36.0)
MCV: 91.5 fL (ref 78.0–100.0)
MONOS PCT: 1 %
Monocytes Absolute: 0.6 10*3/uL (ref 0.1–1.0)
Neutro Abs: 57.8 10*3/uL — ABNORMAL HIGH (ref 1.7–7.7)
Neutrophils Relative %: 97 %
Platelets: 386 10*3/uL (ref 150–400)
RBC: 3.43 MIL/uL — AB (ref 3.87–5.11)
RDW: 16.8 % — ABNORMAL HIGH (ref 11.5–15.5)
WBC: 59.6 10*3/uL — AB (ref 4.0–10.5)

## 2017-11-19 LAB — LIPASE, BLOOD: Lipase: 19 U/L (ref 11–51)

## 2017-11-19 MED ORDER — ACETAMINOPHEN 325 MG PO TABS
650.0000 mg | ORAL_TABLET | Freq: Once | ORAL | Status: AC
  Administered 2017-11-19: 650 mg via ORAL
  Filled 2017-11-19: qty 2

## 2017-11-19 MED ORDER — IOHEXOL 300 MG/ML  SOLN
75.0000 mL | Freq: Once | INTRAMUSCULAR | Status: AC | PRN
  Administered 2017-11-19: 75 mL via INTRAVENOUS

## 2017-11-19 NOTE — ED Notes (Signed)
Pt calling family to pick her up

## 2017-11-19 NOTE — ED Triage Notes (Signed)
Transported by [facility] from [facility]--seen yesterday at this facility for constipation; was given a dose of Miralax and a prescription for same. Could not get the prescription filled and presents today with ongoing constipation x 3 days.

## 2017-11-19 NOTE — ED Notes (Addendum)
Date and time results received: 11/19/17 2:29 PM    Test: WBC Critical Value: 59.6  Name of Provider Notified: Lita Mains MD  Orders Received? Or Actions Taken?: confirmed aware of WBC as documented
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
## 2017-11-19 NOTE — Patient Outreach (Signed)
[facility]) Care Management  11/19/2017  [name] [birth year] [id number]  [facility] CSW received new referral for transportation assistance for appointment today and financial/supportive resources. Patient already has SCAT in place and no longer needs transportation to appointment today as she went to ED. Patient still in need of community resource support. [facility] CSW will forward referral to [place] to address community resource need.  Eula Fried, BSW, MSW, [email or web address] Phone: [phone] Fax: [phone]

## 2017-11-19 NOTE — Patient Outreach (Signed)
[facility]) Care Management  11/19/2017  [name] 12/09/1940 [id number]   Telephone Screen  Referral Date: 11/06/17 Referral Source: MD Referral- [facility] internal medicine Referral Reason: New diagnosis of lung cancer (non small squamous cell stage 4 in June 2019)  Insurance: [place] health care [employer] attempt # 2 successful to her listed home and mobile number  [facility] RN CM reviewed Epic to note that this patient has had admissions x 3 and ED visits x 5 in the last 6 months. She has had 2 ED visits with discharges on 11/15/17 for chronic CP and 11/18/17 for constipation and hemorrhoids since Cm initiated call attempts with her on 11/06/17. Her first chemo tx was on 11/18/17  Patient is able to verify HIPAA Reviewed and addressed referral to [facility] with patient  Today [name] is mostly concerned about an oncology appointment and a cath site that needs flushing She reports she had an appointment that was canceled but has another one scheduled at 345 pm today 11/19/17. Today her aide is unable to take her to the afternoon appointment She is also complaining about hurting from hemorrhoids and attempts to have a stool during the call   Social: Divorced patient with a son, [name], and daughter [name] listed in emergency contact but lives alone.  She reports she is legally blind, hard of hearing (has hearing aid) and uses a walker but able to complete her ADLs independent and has an aide in the mornings for 2 1/2 hours, Layla Maw.  Uses SCAT (has a punch card) but today unable to use SCAT related to need to notify SCAT 24 hours prior. States she does not have a lot of finances. Cm inquired if she had finances to assist with a discounted cab and she stated she did not have the finances for a discounted cab  Conditions:  COPD, CKD, PAD, blindness, h/o DVT, anemia, hyperlipidemia, CKD state 3, HTN, PVD arthritis (left shoulder) constipation, hemorrhoids, chronic  chest pain  Medications Informed CM initially she had only 2 pills (Lipitor an Plavix)  but later discussed new pills ordered after ED visits (miralax and Anusol and hydrocodone. She denies concerns with taking medications as prescribed, affording medications, side effects of medications and questions about medications.   Advanced directives Dana Aguado reports not being interested in [place] not living will at this time.  Plan: [facility] RN CM will refer [name] to a [facility] RN CM and SW for alternative transportation to appointment today 11/19/17, support system resources and financial resources. [facility] RN CM consulted CMA, L Comer and [facility] SW, Tillie Rung and sent in basket message to [place]. Lavina Hamman, RN, BSN, CCM [facility] Telephonic Care Management Care Coordinator Direct number [phone]  [facility] number [phone] Fax number [phone]

## 2017-11-19 NOTE — Discharge Instructions (Signed)
Take miralax once a day until having regular bowel movements

## 2017-11-19 NOTE — ED Notes (Signed)
[facility] has been contacted regarding patient transport at this time.

## 2017-11-19 NOTE — ED Notes (Signed)
Attempted to call son at this time for patient transport related to her being discharged home--no answer.

## 2017-11-19 NOTE — ED Triage Notes (Signed)
Pt is from [facility] independent living and was just discharged today from [facility] ED for same complaint. RN asked pt why she called EMS and she stated "Just cut the hemorrhoids out so I can poop without pain."

## 2017-11-19 NOTE — Patient Outreach (Signed)
[facility]) Care Management  11/19/2017  [name] 1941-01-24 [id number]  BSW received an incoming call from [place], Jackelyn Poling, requesting immediate transportation assistance to the patient for a 3:45 pm physician appointment. BSW outreached the patient who informed this BSW she was currently in the ED and no longer in need of immediate transportation. BSW notified RNCM, Jackelyn Poling via an in [employer].  Daneen Schick, BSW, CDP [facility] [phone]

## 2017-11-19 NOTE — Discharge Instructions (Addendum)
Call your oncology (cancer) clinic for any questions or problems that come up, such as management of constipation. Drink 1/2 a bottle of milk of magnesia for your constipation. Follow up with your doctors as scheduled.
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
## 2017-11-20 ENCOUNTER — Other Ambulatory Visit: Payer: Self-pay

## 2017-11-20 NOTE — ED Notes (Signed)
Son called back and states that he will transport the patient home

## 2017-11-20 NOTE — ED Notes (Signed)
[facility] called for patient transport 

## 2017-11-20 NOTE — Patient Outreach (Signed)
[facility]) Care Management  11/20/2017  TANEIA MEALOR 1941/02/27 [id number]  Subjective: "I got bad hemorrhoids"   RNCM called post emergency room visit for hemorrhoids and called to schedule an appointment. 77 year old with history of blindness, hearing loss, CKD, HTN, right lung cancer, hyperlipidemia  Client presented to the emergency room on 7/9 due to constipation/hemorrhoids. Client reports she lives alone, but has a son and daughter who are supportive.  Client states she is uncomfortable due to her hemorrhoids and does not want to talk. RNCM reviewed medications and client states she does not use the Anusol suppositories or take the pain medications. RNCM encouraged client to take medications as prescribed in order to promote comfort and healing. Client acknowledged understanding and then hurried off the phone.  Plan: RNCM will follow up next week to schedule a home visit.  Thea Silversmith, RN, MSN, [place] Coordinator Cell: [phone]

## 2017-11-20 NOTE — ED Notes (Signed)
Pt son came to pick her up. Pt unable to see pad to sign. Pt wheeled to son's vehicle in no distress. A&Ox4.

## 2017-11-21 ENCOUNTER — Emergency Department (HOSPITAL_COMMUNITY): Payer: Medicare Other

## 2017-11-21 ENCOUNTER — Other Ambulatory Visit: Payer: Self-pay

## 2017-11-21 ENCOUNTER — Emergency Department (HOSPITAL_COMMUNITY)
Admission: EM | Admit: 2017-11-21 | Discharge: 2017-11-21 | Disposition: A | Discharge status: Home / Self Care | Payer: Medicare Other | Attending: Emergency Medicine | Admitting: Emergency Medicine

## 2017-11-21 DIAGNOSIS — E785 Hyperlipidemia, unspecified: Secondary | ICD-10-CM | POA: Diagnosis not present

## 2017-11-21 DIAGNOSIS — J449 Chronic obstructive pulmonary disease, unspecified: Secondary | ICD-10-CM | POA: Diagnosis not present

## 2017-11-21 DIAGNOSIS — Z7902 Long term (current) use of antithrombotics/antiplatelets: Secondary | ICD-10-CM | POA: Diagnosis not present

## 2017-11-21 DIAGNOSIS — R5381 Other malaise: Secondary | ICD-10-CM | POA: Diagnosis not present

## 2017-11-21 DIAGNOSIS — K649 Unspecified hemorrhoids: Secondary | ICD-10-CM | POA: Insufficient documentation

## 2017-11-21 DIAGNOSIS — Z87891 Personal history of nicotine dependence: Secondary | ICD-10-CM | POA: Insufficient documentation

## 2017-11-21 DIAGNOSIS — K6289 Other specified diseases of anus and rectum: Secondary | ICD-10-CM | POA: Diagnosis not present

## 2017-11-21 DIAGNOSIS — Z79899 Other long term (current) drug therapy: Secondary | ICD-10-CM | POA: Insufficient documentation

## 2017-11-21 DIAGNOSIS — I129 Hypertensive chronic kidney disease with stage 1 through stage 4 chronic kidney disease, or unspecified chronic kidney disease: Secondary | ICD-10-CM | POA: Diagnosis not present

## 2017-11-21 DIAGNOSIS — Z86718 Personal history of other venous thrombosis and embolism: Secondary | ICD-10-CM | POA: Diagnosis not present

## 2017-11-21 DIAGNOSIS — Z743 Need for continuous supervision: Secondary | ICD-10-CM | POA: Diagnosis not present

## 2017-11-21 DIAGNOSIS — R0902 Hypoxemia: Secondary | ICD-10-CM | POA: Diagnosis not present

## 2017-11-21 DIAGNOSIS — R000 Tachycardia, unspecified: Secondary | ICD-10-CM | POA: Diagnosis not present

## 2017-11-21 DIAGNOSIS — R0789 Other chest pain: Secondary | ICD-10-CM | POA: Diagnosis not present

## 2017-11-21 DIAGNOSIS — R279 Unspecified lack of coordination: Secondary | ICD-10-CM | POA: Diagnosis not present

## 2017-11-21 DIAGNOSIS — N183 Chronic kidney disease, stage 3 (moderate): Secondary | ICD-10-CM | POA: Diagnosis not present

## 2017-11-21 DIAGNOSIS — R079 Chest pain, unspecified: Secondary | ICD-10-CM | POA: Diagnosis not present

## 2017-11-21 DIAGNOSIS — R1084 Generalized abdominal pain: Secondary | ICD-10-CM | POA: Diagnosis not present

## 2017-11-21 LAB — BASIC METABOLIC PANEL
ANION GAP: 14 (ref 5–15)
BUN: 25 mg/dL — AB (ref 8–23)
CHLORIDE: 106 mmol/L (ref 98–111)
CO2: 21 mmol/L — ABNORMAL LOW (ref 22–32)
Calcium: 9 mg/dL (ref 8.9–10.3)
Creatinine, Ser: 1.48 mg/dL — ABNORMAL HIGH (ref 0.44–1.00)
GFR, EST AFRICAN AMERICAN: 38 mL/min — AB (ref >60)
GFR, EST NON AFRICAN AMERICAN: 33 mL/min — AB (ref >60)
Glucose, Bld: 122 mg/dL — ABNORMAL HIGH (ref 70–99)
POTASSIUM: 4.6 mmol/L (ref 3.5–5.1)
SODIUM: 141 mmol/L (ref 135–145)

## 2017-11-21 LAB — CBC WITH DIFFERENTIAL/PLATELET
BASOS ABS: 0 10*3/uL (ref 0.0–0.1)
Basophils Relative: 0 %
EOS ABS: 0 10*3/uL (ref 0.0–0.7)
Eosinophils Relative: 0 %
HCT: 35.3 % — ABNORMAL LOW (ref 36.0–46.0)
Hemoglobin: 11 g/dL — ABNORMAL LOW (ref 12.0–15.0)
LYMPHS ABS: 0.5 10*3/uL — AB (ref 0.7–4.0)
Lymphocytes Relative: 1 %
MCH: 29.1 pg (ref 26.0–34.0)
MCHC: 31.2 g/dL (ref 30.0–36.0)
MCV: 93.4 fL (ref 78.0–100.0)
Monocytes Absolute: 0.5 10*3/uL (ref 0.1–1.0)
Monocytes Relative: 1 %
NEUTROS ABS: 53.8 10*3/uL — AB (ref 1.7–7.7)
Neutrophils Relative %: 98 %
Platelets: 310 10*3/uL (ref 150–400)
RBC: 3.78 MIL/uL — AB (ref 3.87–5.11)
RDW: 16.7 % — AB (ref 11.5–15.5)
WBC: 54.8 10*3/uL — AB (ref 4.0–10.5)

## 2017-11-21 LAB — I-STAT TROPONIN, ED: TROPONIN I, POC: 0.04 ng/mL (ref 0.00–0.08)

## 2017-11-21 MED ORDER — HYDROCORTISONE 2.5 % RE CREA: TOPICAL_CREAM | RECTAL | 1 refills | Status: DC

## 2017-11-21 NOTE — ED Notes (Signed)
Patient transported to X-ray 

## 2017-11-21 NOTE — Discharge Instructions (Addendum)
Anusol cream twice daily as prescribed.  Continue taking MiraLAX as previously prescribed.

## 2017-11-21 NOTE — ED Notes (Signed)
Patient verbalizes understanding of discharge instructions. Opportunity for questioning and answers were provided. Pt discharged from ED via [place].

## 2017-11-21 NOTE — ED Triage Notes (Signed)
Per EMS pt called for CP, was not complaining of SOB but wheezing in all fields.  Pts biggest complaint was hemorrhoids. EKG OK., was at the cancer center on the 9th and ED on the 11th for constipation. PER EMS, pt's home was is disarray, feces all over the place, bed was wet.  It is apparent pt is unable to take care of herself.
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
## 2017-11-21 NOTE — ED Notes (Signed)
Delay in lab draw,  Pt in xray.

## 2017-11-22 ENCOUNTER — Encounter (HOSPITAL_COMMUNITY): Payer: Self-pay | Admitting: Emergency Medicine

## 2017-11-22 ENCOUNTER — Emergency Department (HOSPITAL_COMMUNITY)
Admission: EM | Admit: 2017-11-22 | Discharge: 2017-11-22 | Disposition: A | Discharge status: Home / Self Care | Payer: Medicare Other | Source: Home / Self Care | Attending: Emergency Medicine | Admitting: Emergency Medicine

## 2017-11-22 DIAGNOSIS — I10 Essential (primary) hypertension: Secondary | ICD-10-CM

## 2017-11-22 DIAGNOSIS — C3490 Malignant neoplasm of unspecified part of unspecified bronchus or lung: Secondary | ICD-10-CM | POA: Insufficient documentation

## 2017-11-22 DIAGNOSIS — I639 Cerebral infarction, unspecified: Secondary | ICD-10-CM | POA: Diagnosis not present

## 2017-11-22 DIAGNOSIS — T451X5A Adverse effect of antineoplastic and immunosuppressive drugs, initial encounter: Secondary | ICD-10-CM | POA: Diagnosis not present

## 2017-11-22 DIAGNOSIS — R279 Unspecified lack of coordination: Secondary | ICD-10-CM | POA: Diagnosis not present

## 2017-11-22 DIAGNOSIS — H9190 Unspecified hearing loss, unspecified ear: Secondary | ICD-10-CM | POA: Diagnosis not present

## 2017-11-22 DIAGNOSIS — Z947 Corneal transplant status: Secondary | ICD-10-CM | POA: Diagnosis not present

## 2017-11-22 DIAGNOSIS — R197 Diarrhea, unspecified: Secondary | ICD-10-CM | POA: Diagnosis not present

## 2017-11-22 DIAGNOSIS — Z79899 Other long term (current) drug therapy: Secondary | ICD-10-CM

## 2017-11-22 DIAGNOSIS — A041 Enterotoxigenic Escherichia coli infection: Secondary | ICD-10-CM | POA: Diagnosis not present

## 2017-11-22 DIAGNOSIS — C3491 Malignant neoplasm of unspecified part of right bronchus or lung: Secondary | ICD-10-CM | POA: Diagnosis not present

## 2017-11-22 DIAGNOSIS — Z87891 Personal history of nicotine dependence: Secondary | ICD-10-CM | POA: Insufficient documentation

## 2017-11-22 DIAGNOSIS — Z743 Need for continuous supervision: Secondary | ICD-10-CM | POA: Diagnosis not present

## 2017-11-22 DIAGNOSIS — A09 Infectious gastroenteritis and colitis, unspecified: Secondary | ICD-10-CM | POA: Diagnosis not present

## 2017-11-22 DIAGNOSIS — N183 Chronic kidney disease, stage 3 (moderate): Secondary | ICD-10-CM | POA: Diagnosis not present

## 2017-11-22 DIAGNOSIS — D631 Anemia in chronic kidney disease: Secondary | ICD-10-CM | POA: Diagnosis not present

## 2017-11-22 DIAGNOSIS — R0902 Hypoxemia: Secondary | ICD-10-CM | POA: Diagnosis not present

## 2017-11-22 DIAGNOSIS — Z7401 Bed confinement status: Secondary | ICD-10-CM | POA: Diagnosis not present

## 2017-11-22 DIAGNOSIS — Z7189 Other specified counseling: Secondary | ICD-10-CM | POA: Diagnosis not present

## 2017-11-22 DIAGNOSIS — N390 Urinary tract infection, site not specified: Secondary | ICD-10-CM | POA: Diagnosis not present

## 2017-11-22 DIAGNOSIS — D6481 Anemia due to antineoplastic chemotherapy: Secondary | ICD-10-CM | POA: Diagnosis not present

## 2017-11-22 DIAGNOSIS — I340 Nonrheumatic mitral (valve) insufficiency: Secondary | ICD-10-CM | POA: Diagnosis not present

## 2017-11-22 DIAGNOSIS — I959 Hypotension, unspecified: Secondary | ICD-10-CM | POA: Diagnosis not present

## 2017-11-22 DIAGNOSIS — A419 Sepsis, unspecified organism: Secondary | ICD-10-CM | POA: Diagnosis not present

## 2017-11-22 DIAGNOSIS — J181 Lobar pneumonia, unspecified organism: Secondary | ICD-10-CM | POA: Diagnosis not present

## 2017-11-22 DIAGNOSIS — Z515 Encounter for palliative care: Secondary | ICD-10-CM | POA: Diagnosis not present

## 2017-11-22 DIAGNOSIS — N170 Acute kidney failure with tubular necrosis: Secondary | ICD-10-CM | POA: Diagnosis not present

## 2017-11-22 DIAGNOSIS — H548 Legal blindness, as defined in USA: Secondary | ICD-10-CM | POA: Diagnosis not present

## 2017-11-22 DIAGNOSIS — I739 Peripheral vascular disease, unspecified: Secondary | ICD-10-CM | POA: Diagnosis not present

## 2017-11-22 DIAGNOSIS — D61818 Other pancytopenia: Secondary | ICD-10-CM | POA: Diagnosis not present

## 2017-11-22 DIAGNOSIS — G8929 Other chronic pain: Secondary | ICD-10-CM | POA: Diagnosis not present

## 2017-11-22 DIAGNOSIS — R0789 Other chest pain: Secondary | ICD-10-CM | POA: Diagnosis not present

## 2017-11-22 DIAGNOSIS — J189 Pneumonia, unspecified organism: Secondary | ICD-10-CM | POA: Diagnosis not present

## 2017-11-22 DIAGNOSIS — Z7902 Long term (current) use of antithrombotics/antiplatelets: Secondary | ICD-10-CM | POA: Diagnosis not present

## 2017-11-22 DIAGNOSIS — R109 Unspecified abdominal pain: Secondary | ICD-10-CM | POA: Diagnosis not present

## 2017-11-22 DIAGNOSIS — N179 Acute kidney failure, unspecified: Secondary | ICD-10-CM | POA: Diagnosis not present

## 2017-11-22 DIAGNOSIS — G893 Neoplasm related pain (acute) (chronic): Secondary | ICD-10-CM | POA: Insufficient documentation

## 2017-11-22 DIAGNOSIS — I129 Hypertensive chronic kidney disease with stage 1 through stage 4 chronic kidney disease, or unspecified chronic kidney disease: Secondary | ICD-10-CM | POA: Diagnosis not present

## 2017-11-22 DIAGNOSIS — E8770 Fluid overload, unspecified: Secondary | ICD-10-CM | POA: Diagnosis not present

## 2017-11-22 DIAGNOSIS — R0602 Shortness of breath: Secondary | ICD-10-CM | POA: Diagnosis not present

## 2017-11-22 DIAGNOSIS — D61810 Antineoplastic chemotherapy induced pancytopenia: Secondary | ICD-10-CM | POA: Diagnosis not present

## 2017-11-22 DIAGNOSIS — I361 Nonrheumatic tricuspid (valve) insufficiency: Secondary | ICD-10-CM | POA: Diagnosis not present

## 2017-11-22 DIAGNOSIS — R062 Wheezing: Secondary | ICD-10-CM | POA: Diagnosis not present

## 2017-11-22 DIAGNOSIS — M1990 Unspecified osteoarthritis, unspecified site: Secondary | ICD-10-CM | POA: Diagnosis not present

## 2017-11-22 DIAGNOSIS — R52 Pain, unspecified: Secondary | ICD-10-CM | POA: Diagnosis not present

## 2017-11-22 DIAGNOSIS — R0603 Acute respiratory distress: Secondary | ICD-10-CM | POA: Diagnosis not present

## 2017-11-22 DIAGNOSIS — E785 Hyperlipidemia, unspecified: Secondary | ICD-10-CM | POA: Diagnosis not present

## 2017-11-22 DIAGNOSIS — M2550 Pain in unspecified joint: Secondary | ICD-10-CM | POA: Diagnosis not present

## 2017-11-22 DIAGNOSIS — R402441 Other coma, without documented Glasgow coma scale score, or with partial score reported, in the field [EMT or ambulance]: Secondary | ICD-10-CM | POA: Diagnosis not present

## 2017-11-22 DIAGNOSIS — Z86718 Personal history of other venous thrombosis and embolism: Secondary | ICD-10-CM | POA: Insufficient documentation

## 2017-11-22 DIAGNOSIS — R1084 Generalized abdominal pain: Secondary | ICD-10-CM | POA: Diagnosis not present

## 2017-11-22 DIAGNOSIS — J440 Chronic obstructive pulmonary disease with acute lower respiratory infection: Secondary | ICD-10-CM | POA: Diagnosis not present

## 2017-11-22 HISTORY — DX: Malignant neoplasm of unspecified part of unspecified bronchus or lung: C34.90

## 2017-11-22 MED ORDER — HYDROCODONE-ACETAMINOPHEN 5-325 MG PO TABS
1.0000 | ORAL_TABLET | ORAL | Status: DC | PRN
  Administered 2017-11-22: 1 via ORAL
  Filled 2017-11-22: qty 1

## 2017-11-22 MED ORDER — HYDROCODONE-ACETAMINOPHEN 5-325 MG PO TABS: 1.0000 | ORAL_TABLET | ORAL | 0 refills | Status: DC | PRN

## 2017-11-22 NOTE — Clinical Social Work Note (Signed)
Clinical Social Work Assessment  Patient Details  Name: [name] MRN: [id number] Date of Birth: 1941/01/07  Date of referral:  11/22/17               Reason for consult:  Housing Concerns/Homelessness, Family Concerns, Medication Concerns                Permission sought to share information with:  Case Manager Permission granted to share information::  Yes, Verbal Permission Granted  Name::        Agency::     Relationship::     Contact Information:     Housing/Transportation Living arrangements for the past 2 months:  [place] of Information:  Patient Patient Interpreter Needed:  None Criminal Activity/Legal Involvement Pertinent to Current Situation/Hospitalization:  No - Comment as needed Significant Relationships:  Adult Children Lives with:  Facility Resident Do you feel safe going back to the place where you live?  Yes Need for family participation in patient care:     Care giving concerns:  CSW consulted due to concerns of no family involvement with the patient's care and patient not taking medication for pain. Patient is blind and receiving chemotherapy. Patient resides at [facility].   Social Worker assessment / plan:  CSW met with patient at bedside to complete assessment.  Patient states she has not taken her pain medications by choice, stating she will take "one or two when I really need them." Patient denies issues accessing medications. Patient reports feeling safe and agreeable to returning to [facility]; patient reports she likes the food and has taken [facility] to get to doctors appointments from the facility. Patient reportedly uses a walker to ambulate.  Patient reports that her adult son "won't come near me," and "doesn't want to see me like this." Patient states that her extended family lives in [place] and they are not involved in her care either, citing distance.   Patient inquired about in-home health, CSW to  consult Case management.  Employment status:  Disabled (Comment on whether or not currently receiving Disability) Insurance information:  Programmer, applications, Medicaid In [employer] Primary, Medicaid Secondary) PT Recommendations:  Not assessed at this time Information / Referral to community resources:  Other (Comment Required)(Case Management Consult for [place])  Patient/Family's Response to care:  Patient was pleasant and agreeable, at times appearing anxious. Patient voiced thanks and appreciation for CSW intervention and satisfaction with hospital care.  Patient/Family's Understanding of and Emotional Response to Diagnosis, Current Treatment, and Prognosis:   Patient agreeable to discharging back to ALF and continuing with her chemotherapy treatment and pain management medications.   Emotional Assessment Appearance:  Appears stated age Attitude/Demeanor/Rapport:    Affect (typically observed):  Pleasant, Anxious Orientation:  Oriented to Self, Oriented to Place, Oriented to  Time, Oriented to Situation Alcohol / Substance use:    Psych involvement (Current and /or in the community):  No (Comment)  Discharge Needs  Concerns to be addressed:  Lack of Support, Medication Concerns Readmission within the last 30 days:  No Current discharge risk:  Other(Patient is legally blind and has no family involvement) Barriers to Discharge:  No Barriers Identified   Joellen Jersey, [place] 11/22/2017, 9:36 AM

## 2017-11-22 NOTE — ED Notes (Signed)
DISCHARGED WITH [facility] WITHOUT EVENT. OLD DISCHARGE PAPERS WITH RX ATTACHED GIVEN TO PT AND [facility] UPON DISCHARGE. VSS. PT UNABLE TO SIGN DISCHARGE ONLY DUE TO PROXIMITY OF SIGNING PADS IN DEPARTMENT AND PT'S LOCATION.

## 2017-11-22 NOTE — Care Management Note (Signed)
Case Management Note  Patient Details  Name: [name] MRN: [id number] Date of Birth: 05-Aug-1940  Subjective/Objective: Referral for [facility] chosen [facility] rep Jermaine for referral,able to accept, aware of d/c otday back to indep liv/[facility] apt[facility] [address] [place] [address].Patient will need [facility] non emergency service-Nsg will call when ready. Nsg/MD aware of d/c plan. No further CM needs.                    Action/Plan:d/c home w/HHC.   Expected Discharge Date:                  Expected Discharge Plan:  [place]  In-House Referral:     Discharge planning Services  CM Consult  Post Acute Care Choice:  Durable Medical Equipment(has rw) Choice offered to:  Patient  DME Arranged:    DME Agency:     HH Arranged:  RN, PT, Nurse's Aide [place] Agency:  Old Agency  Status of Service:  Completed, signed off  If discussed at [place] of Stay Meetings, dates discussed:    Additional Comments:  Dessa Phi, RN 11/22/2017, 9:28 AM

## 2017-11-22 NOTE — ED Provider Notes (Signed)
Patient signed out to me pending sw consult.  Patient seen by cm and plan op order for home health.    Pattricia Boss, MD 11/22/17 1150

## 2017-11-22 NOTE — ED Notes (Signed)
CASE MANAGER -[place] RN IN TO SEE THIS PATIENT.

## 2017-11-22 NOTE — ED Triage Notes (Signed)
Pt presents from independent living facility for evaluation of generalized pain and was seen on 11/21/17 for abdominal pain. EMS reports that pt is a lung cancer patient that gets chemotherapy three times a week with last treatment appx 2 weeks ago. Pt reports having chest pain for the last year along with abdominal pain that are the same complaints that pt presented on 11/21/17

## 2017-11-22 NOTE — ED Notes (Signed)
EDP [facility] WILL DISCHARGE. PT WILL HAVE DISCHARGE ORDERS FOR FACE TO FACE ORDERS FOR HOME VISIT FOR RN, PT AND NURSE AID.

## 2017-11-22 NOTE — Discharge Instructions (Addendum)
Home health assistance ordered. Please follow up with your provider for refill of pain medication

## 2017-11-22 NOTE — Progress Notes (Signed)
CSW met with patient at bedside to complete assessment. Patient agreeable to discharge back to ALF and reports no concerns accessing medication or care giving concerns at the facility.   Patient reportedly uses a walker where she lives at [facility]; patient inquired about home health assistance. CSW contacted Case Management.  No other social work needs identified. Please consult if further social work needs arise. CSW signing off.  Charlotte Hoy, LCSW-A Clinical Social Worker [phone] 

## 2017-11-22 NOTE — ED Notes (Signed)
PT EXPLAINED RN WILL COME AND EVALUATED HER TOMORROW. PT EXPRESS SHE WILL HAVE HER NEIGHBOR TO ASSIST WITH HER CARE.

## 2017-11-22 NOTE — ED Notes (Signed)
Bed: EH21 Expected date:  Expected time:  Means of arrival:  Comments: 77 yo F/Chemo Pt pain
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
## 2017-11-22 NOTE — ED Notes (Signed)
SOCIAL WORK [place] AT BEDSIDE SPEAKING WITH PATIENT

## 2017-11-22 NOTE — ED Notes (Signed)
She is awake and alert and in no distress. She tells me she resides at [facility] facility.

## 2017-11-23 ENCOUNTER — Other Ambulatory Visit: Payer: Self-pay | Admitting: Internal Medicine

## 2017-11-23 ENCOUNTER — Emergency Department (HOSPITAL_COMMUNITY)
Admission: EM | Admit: 2017-11-23 | Discharge: 2017-11-23 | Disposition: A | Discharge status: Home / Self Care | Payer: Medicare Other | Source: Home / Self Care | Attending: Emergency Medicine | Admitting: Emergency Medicine

## 2017-11-23 ENCOUNTER — Encounter (HOSPITAL_COMMUNITY): Payer: Self-pay | Admitting: Emergency Medicine

## 2017-11-23 ENCOUNTER — Other Ambulatory Visit: Payer: Self-pay | Admitting: *Deleted

## 2017-11-23 ENCOUNTER — Other Ambulatory Visit: Payer: Self-pay

## 2017-11-23 DIAGNOSIS — N183 Chronic kidney disease, stage 3 (moderate): Secondary | ICD-10-CM

## 2017-11-23 DIAGNOSIS — I129 Hypertensive chronic kidney disease with stage 1 through stage 4 chronic kidney disease, or unspecified chronic kidney disease: Secondary | ICD-10-CM

## 2017-11-23 DIAGNOSIS — Z87891 Personal history of nicotine dependence: Secondary | ICD-10-CM

## 2017-11-23 DIAGNOSIS — J449 Chronic obstructive pulmonary disease, unspecified: Secondary | ICD-10-CM | POA: Insufficient documentation

## 2017-11-23 DIAGNOSIS — Z79899 Other long term (current) drug therapy: Secondary | ICD-10-CM | POA: Insufficient documentation

## 2017-11-23 DIAGNOSIS — Z7901 Long term (current) use of anticoagulants: Secondary | ICD-10-CM

## 2017-11-23 DIAGNOSIS — G8929 Other chronic pain: Secondary | ICD-10-CM

## 2017-11-23 DIAGNOSIS — Z8679 Personal history of other diseases of the circulatory system: Secondary | ICD-10-CM | POA: Insufficient documentation

## 2017-11-23 DIAGNOSIS — C3490 Malignant neoplasm of unspecified part of unspecified bronchus or lung: Secondary | ICD-10-CM

## 2017-11-23 DIAGNOSIS — Z85118 Personal history of other malignant neoplasm of bronchus and lung: Secondary | ICD-10-CM | POA: Insufficient documentation

## 2017-11-23 MED ORDER — HYDROCODONE-ACETAMINOPHEN 5-325 MG PO TABS
1.0000 | ORAL_TABLET | Freq: Once | ORAL | Status: AC
  Administered 2017-11-23: 1 via ORAL
  Filled 2017-11-23: qty 1

## 2017-11-23 NOTE — Discharge Instructions (Addendum)
Please contact your pharmacy to see if Dr [facility] prescription written on July 10th is there to be picked up for you. If not call Dr Doristine Section office to have her resubmit your prescription.

## 2017-11-23 NOTE — ED Triage Notes (Signed)
Intermittent gneralized abdominal pain today with no relief. Undergoing chemo tx for lung cancer with last tx being 2 weeks ago. [id number]

## 2017-11-23 NOTE — Patient Outreach (Signed)
[facility]) Care Management  11/23/2017  YOLANDE SKODA 04-29-1941 [id number]   Covering for assigned care manager, J. Juleen China.  Member has had multiple ED visits for abdominal pain/constipation (7 times in July).  Call placed to member to follow up on current status, no answer.  HIPAA compliant voice message left.  Unsuccessful letter sent, will have assigned care manager follow up within the next 4 business days.  Valente David, [place], MSN [place] [phone]
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
## 2017-11-23 NOTE — ED Notes (Signed)
Bed: SU11 Expected date:  Expected time:  Means of arrival:  Comments: EMS 77 yo female abdominal pain/lung cancer patient

## 2017-11-24 ENCOUNTER — Emergency Department (HOSPITAL_COMMUNITY): Payer: Medicare Other

## 2017-11-24 ENCOUNTER — Inpatient Hospital Stay: Payer: Medicare Other | Admitting: Nurse Practitioner

## 2017-11-24 ENCOUNTER — Other Ambulatory Visit: Payer: Self-pay

## 2017-11-24 ENCOUNTER — Inpatient Hospital Stay (HOSPITAL_COMMUNITY)
Admission: EM | Admit: 2017-11-24 | Discharge: 2017-12-02 | DRG: 871 | Disposition: A | Discharge status: Skilled Nursing Facility | Payer: Medicare Other | Attending: Internal Medicine | Admitting: Internal Medicine

## 2017-11-24 ENCOUNTER — Inpatient Hospital Stay: Payer: Medicare Other

## 2017-11-24 ENCOUNTER — Other Ambulatory Visit: Payer: Medicare Other

## 2017-11-24 ENCOUNTER — Encounter (HOSPITAL_COMMUNITY): Payer: Self-pay | Admitting: Emergency Medicine

## 2017-11-24 DIAGNOSIS — J189 Pneumonia, unspecified organism: Secondary | ICD-10-CM

## 2017-11-24 DIAGNOSIS — Z515 Encounter for palliative care: Secondary | ICD-10-CM

## 2017-11-24 DIAGNOSIS — T451X5A Adverse effect of antineoplastic and immunosuppressive drugs, initial encounter: Secondary | ICD-10-CM | POA: Diagnosis present

## 2017-11-24 DIAGNOSIS — J440 Chronic obstructive pulmonary disease with acute lower respiratory infection: Secondary | ICD-10-CM | POA: Diagnosis present

## 2017-11-24 DIAGNOSIS — E8770 Fluid overload, unspecified: Secondary | ICD-10-CM | POA: Diagnosis present

## 2017-11-24 DIAGNOSIS — I129 Hypertensive chronic kidney disease with stage 1 through stage 4 chronic kidney disease, or unspecified chronic kidney disease: Secondary | ICD-10-CM | POA: Diagnosis present

## 2017-11-24 DIAGNOSIS — Z86718 Personal history of other venous thrombosis and embolism: Secondary | ICD-10-CM

## 2017-11-24 DIAGNOSIS — Z87891 Personal history of nicotine dependence: Secondary | ICD-10-CM

## 2017-11-24 DIAGNOSIS — E785 Hyperlipidemia, unspecified: Secondary | ICD-10-CM | POA: Diagnosis present

## 2017-11-24 DIAGNOSIS — I639 Cerebral infarction, unspecified: Secondary | ICD-10-CM | POA: Diagnosis not present

## 2017-11-24 DIAGNOSIS — R197 Diarrhea, unspecified: Secondary | ICD-10-CM | POA: Diagnosis present

## 2017-11-24 DIAGNOSIS — R0603 Acute respiratory distress: Secondary | ICD-10-CM

## 2017-11-24 DIAGNOSIS — A419 Sepsis, unspecified organism: Principal | ICD-10-CM | POA: Diagnosis present

## 2017-11-24 DIAGNOSIS — H9190 Unspecified hearing loss, unspecified ear: Secondary | ICD-10-CM | POA: Diagnosis present

## 2017-11-24 DIAGNOSIS — R0902 Hypoxemia: Secondary | ICD-10-CM

## 2017-11-24 DIAGNOSIS — D631 Anemia in chronic kidney disease: Secondary | ICD-10-CM | POA: Diagnosis present

## 2017-11-24 DIAGNOSIS — Z9049 Acquired absence of other specified parts of digestive tract: Secondary | ICD-10-CM

## 2017-11-24 DIAGNOSIS — N179 Acute kidney failure, unspecified: Secondary | ICD-10-CM | POA: Diagnosis present

## 2017-11-24 DIAGNOSIS — D61810 Antineoplastic chemotherapy induced pancytopenia: Secondary | ICD-10-CM | POA: Diagnosis present

## 2017-11-24 DIAGNOSIS — Z7902 Long term (current) use of antithrombotics/antiplatelets: Secondary | ICD-10-CM

## 2017-11-24 DIAGNOSIS — H548 Legal blindness, as defined in USA: Secondary | ICD-10-CM | POA: Diagnosis present

## 2017-11-24 DIAGNOSIS — I739 Peripheral vascular disease, unspecified: Secondary | ICD-10-CM | POA: Diagnosis present

## 2017-11-24 DIAGNOSIS — C3491 Malignant neoplasm of unspecified part of right bronchus or lung: Secondary | ICD-10-CM

## 2017-11-24 DIAGNOSIS — N390 Urinary tract infection, site not specified: Secondary | ICD-10-CM | POA: Diagnosis present

## 2017-11-24 DIAGNOSIS — Z800 Family history of malignant neoplasm of digestive organs: Secondary | ICD-10-CM

## 2017-11-24 DIAGNOSIS — D61818 Other pancytopenia: Secondary | ICD-10-CM | POA: Diagnosis present

## 2017-11-24 DIAGNOSIS — A041 Enterotoxigenic Escherichia coli infection: Secondary | ICD-10-CM | POA: Diagnosis present

## 2017-11-24 DIAGNOSIS — D6481 Anemia due to antineoplastic chemotherapy: Secondary | ICD-10-CM | POA: Diagnosis present

## 2017-11-24 DIAGNOSIS — R062 Wheezing: Secondary | ICD-10-CM

## 2017-11-24 DIAGNOSIS — N183 Chronic kidney disease, stage 3 (moderate): Secondary | ICD-10-CM | POA: Diagnosis present

## 2017-11-24 DIAGNOSIS — Z885 Allergy status to narcotic agent status: Secondary | ICD-10-CM

## 2017-11-24 DIAGNOSIS — Z947 Corneal transplant status: Secondary | ICD-10-CM

## 2017-11-24 DIAGNOSIS — M1990 Unspecified osteoarthritis, unspecified site: Secondary | ICD-10-CM | POA: Diagnosis present

## 2017-11-24 DIAGNOSIS — Z79899 Other long term (current) drug therapy: Secondary | ICD-10-CM

## 2017-11-24 LAB — CBC WITH DIFFERENTIAL/PLATELET
BASOS ABS: 0 10*3/uL (ref 0.0–0.1)
Basophils Relative: 0 %
Eosinophils Absolute: 0.1 10*3/uL (ref 0.0–0.7)
Eosinophils Relative: 2 %
HCT: 31.6 % — ABNORMAL LOW (ref 36.0–46.0)
HEMOGLOBIN: 9.8 g/dL — AB (ref 12.0–15.0)
LYMPHS PCT: 19 %
Lymphs Abs: 0.5 10*3/uL — ABNORMAL LOW (ref 0.7–4.0)
MCH: 28.3 pg (ref 26.0–34.0)
MCHC: 31 g/dL (ref 30.0–36.0)
MCV: 91.3 fL (ref 78.0–100.0)
Monocytes Absolute: 0.5 10*3/uL (ref 0.1–1.0)
Monocytes Relative: 17 %
NEUTROS PCT: 62 %
Neutro Abs: 1.6 10*3/uL — ABNORMAL LOW (ref 1.7–7.7)
Platelets: 119 10*3/uL — ABNORMAL LOW (ref 150–400)
RBC: 3.46 MIL/uL — AB (ref 3.87–5.11)
RDW: 16.6 % — ABNORMAL HIGH (ref 11.5–15.5)
WBC: 2.7 10*3/uL — AB (ref 4.0–10.5)

## 2017-11-24 LAB — COMPREHENSIVE METABOLIC PANEL
ALK PHOS: 81 U/L (ref 38–126)
ALT: 42 U/L (ref 0–44)
ANION GAP: 8 (ref 5–15)
AST: 47 U/L — AB (ref 15–41)
Albumin: 2.1 g/dL — ABNORMAL LOW (ref 3.5–5.0)
BILIRUBIN TOTAL: 0.3 mg/dL (ref 0.3–1.2)
BUN: 36 mg/dL — AB (ref 8–23)
CALCIUM: 8.5 mg/dL — AB (ref 8.9–10.3)
CO2: 23 mmol/L (ref 22–32)
Chloride: 109 mmol/L (ref 98–111)
Creatinine, Ser: 2.06 mg/dL — ABNORMAL HIGH (ref 0.44–1.00)
GFR calc Af Amer: 26 mL/min — ABNORMAL LOW (ref >60)
GFR calc non Af Amer: 22 mL/min — ABNORMAL LOW (ref >60)
GLUCOSE: 166 mg/dL — AB (ref 70–99)
Potassium: 4 mmol/L (ref 3.5–5.1)
SODIUM: 140 mmol/L (ref 135–145)
TOTAL PROTEIN: 5.6 g/dL — AB (ref 6.5–8.1)

## 2017-11-24 LAB — URINALYSIS, ROUTINE W REFLEX MICROSCOPIC
Bilirubin Urine: NEGATIVE
Glucose, UA: NEGATIVE mg/dL
Ketones, ur: NEGATIVE mg/dL
Nitrite: NEGATIVE
PH: 5 (ref 5.0–8.0)
PROTEIN: 30 mg/dL — AB
Specific Gravity, Urine: 1.018 (ref 1.005–1.030)

## 2017-11-24 LAB — I-STAT CG4 LACTIC ACID, ED
LACTIC ACID, VENOUS: 2.44 mmol/L — AB (ref 0.5–1.9)
Lactic Acid, Venous: 0.89 mmol/L (ref 0.5–1.9)

## 2017-11-24 LAB — LIPASE, BLOOD: Lipase: 20 U/L (ref 11–51)

## 2017-11-24 MED ORDER — IPRATROPIUM-ALBUTEROL 0.5-2.5 (3) MG/3ML IN SOLN
3.0000 mL | Freq: Once | RESPIRATORY_TRACT | Status: AC
  Administered 2017-11-24: 3 mL via RESPIRATORY_TRACT
  Filled 2017-11-24: qty 3

## 2017-11-24 MED ORDER — ALBUTEROL SULFATE (2.5 MG/3ML) 0.083% IN NEBU
5.0000 mg | INHALATION_SOLUTION | Freq: Once | RESPIRATORY_TRACT | Status: AC
  Administered 2017-11-24: 5 mg via RESPIRATORY_TRACT
  Filled 2017-11-24: qty 6

## 2017-11-24 MED ORDER — IPRATROPIUM BROMIDE 0.02 % IN SOLN
0.5000 mg | Freq: Once | RESPIRATORY_TRACT | Status: AC
  Administered 2017-11-24: 0.5 mg via RESPIRATORY_TRACT
  Filled 2017-11-24: qty 2.5

## 2017-11-24 MED ORDER — SODIUM CHLORIDE 0.9 % IV BOLUS
1000.0000 mL | Freq: Once | INTRAVENOUS | Status: AC
  Administered 2017-11-24: 1000 mL via INTRAVENOUS

## 2017-11-24 MED ORDER — PIPERACILLIN-TAZOBACTAM 3.375 G IVPB 30 MIN
3.3750 g | Freq: Once | INTRAVENOUS | Status: AC
  Administered 2017-11-24: 3.375 g via INTRAVENOUS
  Filled 2017-11-24: qty 50

## 2017-11-24 NOTE — ED Notes (Signed)
PA made aware of low BP

## 2017-11-24 NOTE — ED Notes (Signed)
Pt has a lactic of 2.44 EDP Isaacs and RN Jenel Lucks made aware

## 2017-11-24 NOTE — ED Triage Notes (Signed)
Patient is complaining of abdominal pain. Patient states the pain has been going all day. Patient has loose stool. Lung cancer patient. Patient is on 3L [place] @ 99% and 93% on room air. Abdominal pain is all over.

## 2017-11-24 NOTE — Patient Outreach (Signed)
[facility]) Care Management  11/24/2017  [name] 1941-02-02 [id number]   Initial outreach to [name] regarding social work referral.  Voicemail message left.  Unsuccessful outreach letter mailed by [facility], Valente David, yesterday.  BSW will attempt to reach her again within four business days.  Ronn Melena, BSW Social Worker [phone]
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
## 2017-11-24 NOTE — Progress Notes (Signed)
A consult was received from an ED physician for Zosyn per pharmacy dosing (for an indication other than meningitis). [name] profile has been reviewed for ht/wt/allergies/indication/available labs. A one time order has been placed for the above antibiotics.  Further antibiotics/pharmacy consults should be ordered by admitting physician if indicated.                       Reuel Boom, PharmD, BCPS [phone] 11/24/2017, 8:50 PM

## 2017-11-25 ENCOUNTER — Encounter (HOSPITAL_COMMUNITY): Payer: Self-pay | Admitting: Internal Medicine

## 2017-11-25 ENCOUNTER — Ambulatory Visit: Payer: Medicare Other | Admitting: Podiatry

## 2017-11-25 ENCOUNTER — Inpatient Hospital Stay (HOSPITAL_COMMUNITY): Payer: Medicare Other

## 2017-11-25 DIAGNOSIS — D61818 Other pancytopenia: Secondary | ICD-10-CM | POA: Diagnosis present

## 2017-11-25 DIAGNOSIS — Z515 Encounter for palliative care: Secondary | ICD-10-CM | POA: Diagnosis present

## 2017-11-25 DIAGNOSIS — N390 Urinary tract infection, site not specified: Secondary | ICD-10-CM | POA: Diagnosis present

## 2017-11-25 DIAGNOSIS — J9600 Acute respiratory failure, unspecified whether with hypoxia or hypercapnia: Secondary | ICD-10-CM | POA: Diagnosis not present

## 2017-11-25 DIAGNOSIS — A09 Infectious gastroenteritis and colitis, unspecified: Secondary | ICD-10-CM | POA: Diagnosis not present

## 2017-11-25 DIAGNOSIS — J189 Pneumonia, unspecified organism: Secondary | ICD-10-CM | POA: Diagnosis present

## 2017-11-25 DIAGNOSIS — N179 Acute kidney failure, unspecified: Secondary | ICD-10-CM | POA: Diagnosis present

## 2017-11-25 DIAGNOSIS — M1990 Unspecified osteoarthritis, unspecified site: Secondary | ICD-10-CM | POA: Diagnosis present

## 2017-11-25 DIAGNOSIS — R05 Cough: Secondary | ICD-10-CM | POA: Diagnosis not present

## 2017-11-25 DIAGNOSIS — R918 Other nonspecific abnormal finding of lung field: Secondary | ICD-10-CM | POA: Diagnosis not present

## 2017-11-25 DIAGNOSIS — C3490 Malignant neoplasm of unspecified part of unspecified bronchus or lung: Secondary | ICD-10-CM | POA: Diagnosis not present

## 2017-11-25 DIAGNOSIS — E785 Hyperlipidemia, unspecified: Secondary | ICD-10-CM | POA: Diagnosis present

## 2017-11-25 DIAGNOSIS — E8770 Fluid overload, unspecified: Secondary | ICD-10-CM | POA: Diagnosis present

## 2017-11-25 DIAGNOSIS — N183 Chronic kidney disease, stage 3 (moderate): Secondary | ICD-10-CM | POA: Diagnosis present

## 2017-11-25 DIAGNOSIS — I361 Nonrheumatic tricuspid (valve) insufficiency: Secondary | ICD-10-CM | POA: Diagnosis not present

## 2017-11-25 DIAGNOSIS — D631 Anemia in chronic kidney disease: Secondary | ICD-10-CM | POA: Diagnosis present

## 2017-11-25 DIAGNOSIS — A419 Sepsis, unspecified organism: Secondary | ICD-10-CM | POA: Diagnosis present

## 2017-11-25 DIAGNOSIS — R0603 Acute respiratory distress: Secondary | ICD-10-CM | POA: Diagnosis not present

## 2017-11-25 DIAGNOSIS — I639 Cerebral infarction, unspecified: Secondary | ICD-10-CM | POA: Diagnosis not present

## 2017-11-25 DIAGNOSIS — R197 Diarrhea, unspecified: Secondary | ICD-10-CM | POA: Diagnosis present

## 2017-11-25 DIAGNOSIS — Z7401 Bed confinement status: Secondary | ICD-10-CM | POA: Diagnosis not present

## 2017-11-25 DIAGNOSIS — Z7902 Long term (current) use of antithrombotics/antiplatelets: Secondary | ICD-10-CM | POA: Diagnosis not present

## 2017-11-25 DIAGNOSIS — M2550 Pain in unspecified joint: Secondary | ICD-10-CM | POA: Diagnosis not present

## 2017-11-25 DIAGNOSIS — I340 Nonrheumatic mitral (valve) insufficiency: Secondary | ICD-10-CM | POA: Diagnosis not present

## 2017-11-25 DIAGNOSIS — R278 Other lack of coordination: Secondary | ICD-10-CM | POA: Diagnosis not present

## 2017-11-25 DIAGNOSIS — H9190 Unspecified hearing loss, unspecified ear: Secondary | ICD-10-CM | POA: Diagnosis present

## 2017-11-25 DIAGNOSIS — Z947 Corneal transplant status: Secondary | ICD-10-CM | POA: Diagnosis not present

## 2017-11-25 DIAGNOSIS — D6481 Anemia due to antineoplastic chemotherapy: Secondary | ICD-10-CM | POA: Diagnosis present

## 2017-11-25 DIAGNOSIS — T451X5A Adverse effect of antineoplastic and immunosuppressive drugs, initial encounter: Secondary | ICD-10-CM | POA: Diagnosis present

## 2017-11-25 DIAGNOSIS — R0902 Hypoxemia: Secondary | ICD-10-CM | POA: Diagnosis not present

## 2017-11-25 DIAGNOSIS — Z7189 Other specified counseling: Secondary | ICD-10-CM | POA: Diagnosis not present

## 2017-11-25 DIAGNOSIS — M6281 Muscle weakness (generalized): Secondary | ICD-10-CM | POA: Diagnosis not present

## 2017-11-25 DIAGNOSIS — D61810 Antineoplastic chemotherapy induced pancytopenia: Secondary | ICD-10-CM | POA: Diagnosis present

## 2017-11-25 DIAGNOSIS — N170 Acute kidney failure with tubular necrosis: Secondary | ICD-10-CM | POA: Diagnosis not present

## 2017-11-25 DIAGNOSIS — J181 Lobar pneumonia, unspecified organism: Secondary | ICD-10-CM | POA: Diagnosis not present

## 2017-11-25 DIAGNOSIS — I129 Hypertensive chronic kidney disease with stage 1 through stage 4 chronic kidney disease, or unspecified chronic kidney disease: Secondary | ICD-10-CM | POA: Diagnosis present

## 2017-11-25 DIAGNOSIS — H548 Legal blindness, as defined in USA: Secondary | ICD-10-CM | POA: Diagnosis present

## 2017-11-25 DIAGNOSIS — Z79899 Other long term (current) drug therapy: Secondary | ICD-10-CM | POA: Diagnosis not present

## 2017-11-25 DIAGNOSIS — J449 Chronic obstructive pulmonary disease, unspecified: Secondary | ICD-10-CM | POA: Diagnosis not present

## 2017-11-25 DIAGNOSIS — A041 Enterotoxigenic Escherichia coli infection: Secondary | ICD-10-CM | POA: Diagnosis present

## 2017-11-25 DIAGNOSIS — J440 Chronic obstructive pulmonary disease with acute lower respiratory infection: Secondary | ICD-10-CM | POA: Diagnosis present

## 2017-11-25 DIAGNOSIS — C3491 Malignant neoplasm of unspecified part of right bronchus or lung: Secondary | ICD-10-CM | POA: Diagnosis present

## 2017-11-25 DIAGNOSIS — I739 Peripheral vascular disease, unspecified: Secondary | ICD-10-CM | POA: Diagnosis present

## 2017-11-25 LAB — CBC WITH DIFFERENTIAL/PLATELET
BASOS PCT: 0 %
Basophils Absolute: 0 10*3/uL (ref 0.0–0.1)
Eosinophils Absolute: 0 10*3/uL (ref 0.0–0.7)
Eosinophils Relative: 1 %
HEMATOCRIT: 26.8 % — AB (ref 36.0–46.0)
HEMOGLOBIN: 8.3 g/dL — AB (ref 12.0–15.0)
LYMPHS ABS: 0.6 10*3/uL — AB (ref 0.7–4.0)
LYMPHS PCT: 14 %
MCH: 28.7 pg (ref 26.0–34.0)
MCHC: 31 g/dL (ref 30.0–36.0)
MCV: 92.7 fL (ref 78.0–100.0)
MONOS PCT: 15 %
Monocytes Absolute: 0.6 10*3/uL (ref 0.1–1.0)
NEUTROS ABS: 3.1 10*3/uL (ref 1.7–7.7)
Neutrophils Relative %: 70 %
Platelets: 136 10*3/uL — ABNORMAL LOW (ref 150–400)
RBC: 2.89 MIL/uL — ABNORMAL LOW (ref 3.87–5.11)
RDW: 16.8 % — AB (ref 11.5–15.5)
WBC: 4.3 10*3/uL (ref 4.0–10.5)

## 2017-11-25 LAB — COMPREHENSIVE METABOLIC PANEL
ALBUMIN: 2.1 g/dL — AB (ref 3.5–5.0)
ALBUMIN: 2 g/dL — AB (ref 3.5–5.0)
ALK PHOS: 71 U/L (ref 38–126)
ALT: 45 U/L — AB (ref 0–44)
ALT: 42 U/L (ref 0–44)
ANION GAP: 11 (ref 5–15)
AST: 45 U/L — AB (ref 15–41)
AST: 40 U/L (ref 15–41)
Alkaline Phosphatase: 64 U/L (ref 38–126)
Anion gap: 8 (ref 5–15)
BUN: 33 mg/dL — AB (ref 8–23)
BUN: 35 mg/dL — ABNORMAL HIGH (ref 8–23)
CALCIUM: 8.3 mg/dL — AB (ref 8.9–10.3)
CHLORIDE: 113 mmol/L — AB (ref 98–111)
CO2: 21 mmol/L — AB (ref 22–32)
CO2: 20 mmol/L — AB (ref 22–32)
Calcium: 8 mg/dL — ABNORMAL LOW (ref 8.9–10.3)
Chloride: 111 mmol/L (ref 98–111)
Creatinine, Ser: 1.99 mg/dL — ABNORMAL HIGH (ref 0.44–1.00)
Creatinine, Ser: 2.07 mg/dL — ABNORMAL HIGH (ref 0.44–1.00)
GFR calc Af Amer: 25 mL/min — ABNORMAL LOW (ref >60)
GFR calc non Af Amer: 23 mL/min — ABNORMAL LOW (ref >60)
GFR, EST AFRICAN AMERICAN: 27 mL/min — AB (ref >60)
GFR, EST NON AFRICAN AMERICAN: 22 mL/min — AB (ref >60)
GLUCOSE: 100 mg/dL — AB (ref 70–99)
GLUCOSE: 113 mg/dL — AB (ref 70–99)
POTASSIUM: 4.4 mmol/L (ref 3.5–5.1)
POTASSIUM: 4.7 mmol/L (ref 3.5–5.1)
SODIUM: 143 mmol/L (ref 135–145)
SODIUM: 141 mmol/L (ref 135–145)
TOTAL PROTEIN: 5.6 g/dL — AB (ref 6.5–8.1)
Total Bilirubin: 0.7 mg/dL (ref 0.3–1.2)
Total Bilirubin: 0.7 mg/dL (ref 0.3–1.2)
Total Protein: 5.4 g/dL — ABNORMAL LOW (ref 6.5–8.1)

## 2017-11-25 LAB — LACTIC ACID, PLASMA
LACTIC ACID, VENOUS: 1.5 mmol/L (ref 0.5–1.9)
Lactic Acid, Venous: 1.5 mmol/L (ref 0.5–1.9)

## 2017-11-25 LAB — CBC
HCT: 24.9 % — ABNORMAL LOW (ref 36.0–46.0)
Hemoglobin: 7.6 g/dL — ABNORMAL LOW (ref 12.0–15.0)
MCH: 28.7 pg (ref 26.0–34.0)
MCHC: 30.5 g/dL (ref 30.0–36.0)
MCV: 94 fL (ref 78.0–100.0)
PLATELETS: 134 10*3/uL — AB (ref 150–400)
RBC: 2.65 MIL/uL — AB (ref 3.87–5.11)
RDW: 17 % — AB (ref 11.5–15.5)
WBC: 4 10*3/uL (ref 4.0–10.5)

## 2017-11-25 LAB — D-DIMER, QUANTITATIVE (NOT AT ARMC): D DIMER QUANT: 3.63 ug{FEU}/mL — AB (ref 0.00–0.50)

## 2017-11-25 LAB — PREPARE RBC (CROSSMATCH)

## 2017-11-25 LAB — MRSA PCR SCREENING: MRSA by PCR: NEGATIVE

## 2017-11-25 MED ORDER — ENOXAPARIN SODIUM 30 MG/0.3ML ~~LOC~~ SOLN: 30.0000 mg | SUBCUTANEOUS | Status: DC

## 2017-11-25 MED ORDER — SODIUM CHLORIDE 0.9% IV SOLUTION: Freq: Once | INTRAVENOUS | Status: DC

## 2017-11-25 MED ORDER — SODIUM CHLORIDE 0.9 % IV SOLN
1.0000 g | INTRAVENOUS | Status: DC
  Administered 2017-11-25 – 2017-12-01 (×7): 1 g via INTRAVENOUS
  Filled 2017-11-25 (×7): qty 1

## 2017-11-25 MED ORDER — ATORVASTATIN CALCIUM 40 MG PO TABS
40.0000 mg | ORAL_TABLET | Freq: Every day | ORAL | Status: DC
  Administered 2017-11-25 – 2017-12-02 (×8): 40 mg via ORAL
  Filled 2017-11-25 (×8): qty 1

## 2017-11-25 MED ORDER — ALBUTEROL SULFATE (2.5 MG/3ML) 0.083% IN NEBU
INHALATION_SOLUTION | RESPIRATORY_TRACT | Status: AC
  Administered 2017-11-25: 2.5 mg
  Filled 2017-11-25: qty 3

## 2017-11-25 MED ORDER — CLOPIDOGREL BISULFATE 75 MG PO TABS
75.0000 mg | ORAL_TABLET | Freq: Every day | ORAL | Status: DC
  Administered 2017-11-25 – 2017-12-02 (×8): 75 mg via ORAL
  Filled 2017-11-25 (×8): qty 1

## 2017-11-25 MED ORDER — VANCOMYCIN HCL IN DEXTROSE 1-5 GM/200ML-% IV SOLN
1000.0000 mg | Freq: Once | INTRAVENOUS | Status: AC
  Administered 2017-11-25: 1000 mg via INTRAVENOUS
  Filled 2017-11-25: qty 200

## 2017-11-25 MED ORDER — VANCOMYCIN HCL IN DEXTROSE 750-5 MG/150ML-% IV SOLN
750.0000 mg | INTRAVENOUS | Status: DC
  Administered 2017-11-27: 750 mg via INTRAVENOUS
  Filled 2017-11-25: qty 150

## 2017-11-25 MED ORDER — ENOXAPARIN SODIUM 40 MG/0.4ML ~~LOC~~ SOLN
40.0000 mg | SUBCUTANEOUS | Status: DC
  Administered 2017-11-25: 40 mg via SUBCUTANEOUS
  Filled 2017-11-25 (×2): qty 0.4

## 2017-11-25 MED ORDER — SODIUM CHLORIDE 0.9 % IV SOLN
INTRAVENOUS | Status: DC
  Administered 2017-11-25: 15:00:00 via INTRAVENOUS

## 2017-11-25 MED ORDER — HYDROCODONE-ACETAMINOPHEN 5-325 MG PO TABS
1.0000 | ORAL_TABLET | ORAL | Status: DC | PRN
  Administered 2017-11-25: 1 via ORAL
  Filled 2017-11-25: qty 1

## 2017-11-25 MED ORDER — ALBUTEROL SULFATE (2.5 MG/3ML) 0.083% IN NEBU
2.5000 mg | INHALATION_SOLUTION | RESPIRATORY_TRACT | Status: DC | PRN
  Administered 2017-11-30: 2.5 mg via RESPIRATORY_TRACT
  Filled 2017-11-25: qty 3

## 2017-11-25 MED ORDER — ALBUTEROL (5 MG/ML) CONTINUOUS INHALATION SOLN
2.5000 mg/h | INHALATION_SOLUTION | RESPIRATORY_TRACT | Status: AC
  Filled 2017-11-25: qty 20

## 2017-11-25 MED ORDER — ACETAMINOPHEN 325 MG PO TABS
650.0000 mg | ORAL_TABLET | Freq: Four times a day (QID) | ORAL | Status: DC | PRN
  Administered 2017-11-25 – 2017-11-30 (×8): 650 mg via ORAL
  Filled 2017-11-25 (×8): qty 2

## 2017-11-25 MED ORDER — ONDANSETRON HCL 4 MG PO TABS: 4.0000 mg | ORAL_TABLET | Freq: Four times a day (QID) | ORAL | Status: DC | PRN

## 2017-11-25 MED ORDER — SODIUM CHLORIDE 0.9 % IV SOLN
INTRAVENOUS | Status: AC
  Administered 2017-11-25 (×2): via INTRAVENOUS

## 2017-11-25 MED ORDER — ALBUTEROL SULFATE (2.5 MG/3ML) 0.083% IN NEBU
2.5000 mg | INHALATION_SOLUTION | RESPIRATORY_TRACT | Status: DC
  Administered 2017-11-25: 2.5 mg via RESPIRATORY_TRACT
  Filled 2017-11-25: qty 3

## 2017-11-25 MED ORDER — ALBUTEROL SULFATE (2.5 MG/3ML) 0.083% IN NEBU
2.5000 mg | INHALATION_SOLUTION | Freq: Four times a day (QID) | RESPIRATORY_TRACT | Status: DC
  Administered 2017-11-26 – 2017-11-29 (×14): 2.5 mg via RESPIRATORY_TRACT
  Filled 2017-11-25 (×13): qty 3

## 2017-11-25 MED ORDER — ACETAMINOPHEN 650 MG RE SUPP: 650.0000 mg | Freq: Four times a day (QID) | RECTAL | Status: DC | PRN

## 2017-11-25 MED ORDER — ONDANSETRON HCL 4 MG/2ML IJ SOLN: 4.0000 mg | Freq: Four times a day (QID) | INTRAMUSCULAR | Status: DC | PRN
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
## 2017-11-25 NOTE — ED Notes (Signed)
Hospitalist made aware of pts low BP.

## 2017-11-25 NOTE — Plan of Care (Signed)
Non-small cell lung cancer had chemotherapy last week admitted with abdominal pain and diarrhea.  No complaints of nausea vomiting.  When I saw her she was eating breakfast.  She has been admitted this morning by my partner.  She has chronic abdominal pain and chest pain.  Patient has been hypotensive and tachycardic with fever in the ER is being given IV fluid boluses.  Possible diagnosis of sepsis secondary to UTI and/or pneumonia .  She has been started on vancomycin Zosyn and cefepime.  Patient is extremely hard of hearing and legally blind.  Tried reaching her son to get more information.  I have left him with a phone number to call me back.  D-dimer has been added to the labs from this morning if it is elevated I will get a VQ scan.  She has pancytopenia possibly secondary to recent chemo.  The scan of the abdomen and pelvisNo acute intra-abdominal or pelvic process by noncontrast CT. 2. Similar small RIGHT pleural effusion. 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

## 2017-11-25 NOTE — Progress Notes (Signed)
Pharmacy Antibiotic Note  [name] is a 77 y.o. female admitted on 11/24/2017 with a past medical history of lung cancer diagnosed June 2019,CKD, COPD, hypertension, who presents to ED for evaluation of abdominal pain, diarrhea and fever..  Pharmacy has been consulted for vanc and cefepime for sepsis.  Plan: Vancomycin 1gm IV x 1 then 750mg  q48 (AUC 481.5, Scr 2.06) Cefepime 1gm IV q24h Follow renal function, cultures and clinical course  Height: 5\' 4"  (162.6 cm) Weight: 127 lb (57.6 kg) IBW/kg (Calculated) : 54.7  Temp (24hrs), Avg:99.3 F (37.4 C), Min:98.4 F (36.9 C), Max:100.2 F (37.9 C)  Recent Labs  Lab 11/19/17 1403 11/21/17 0423 11/24/17 2033 11/24/17 2041 11/24/17 2324  WBC 59.6* 54.8* 2.7*  --   --   CREATININE 1.30* 1.48* 2.06*  --   --   LATICACIDVEN  --   --   --  2.44* 0.89    Estimated Creatinine Clearance: 19.7 mL/min (A) (by C-G formula based on SCr of 2.06 mg/dL (H)).    Allergies  Allergen Reactions  . Codeine Other (See Comments)    REACTION: Pt c/o "feeling high"  . Morphine Other (See Comments)    REACTION: feels funny and "high"    Antimicrobials this admission: 7/16 zosyn  X 1 7/17 vanc >> 7/17 cefepime >> Dose adjustments this admission:   Microbiology results: 7/16 BCx:  7/16 UCx:     Thank you for allowing pharmacy to be a part of this patient's care.  Dolly Rias RPh 11/25/2017, 4:38 AM Pager [phone]

## 2017-11-25 NOTE — ED Notes (Signed)
ED TO INPATIENT HANDOFF REPORT  Name/Age/Gender [name] 77 y.o. female  Code Status    Code Status Orders  (From admission, onward)        Start     Ordered   11/25/17 1720  Limited resuscitation (code)  Continuous    Question Answer Comment  In the event of cardiac or respiratory ARREST: Initiate Code Blue, Call Rapid Response Yes   In the event of cardiac or respiratory ARREST: Perform CPR Yes   In the event of cardiac or respiratory ARREST: Perform Intubation/Mechanical Ventilation No   In the event of cardiac or respiratory ARREST: Use NIPPV/BiPAp only if indicated Yes   In the event of cardiac or respiratory ARREST: Administer ACLS medications if indicated Yes   In the event of cardiac or respiratory ARREST: Perform Defibrillation or Cardioversion if indicated Yes      11/25/17 1720    Code Status History    Date Active Date Inactive Code Status Order ID Comments User Context   11/25/2017 0421 11/25/2017 1720 Full Code [id number]  Rise Patience, MD ED   10/16/2017 1529 10/17/2017 1525 Full Code [id number]  Collene Gobble, MD Inpatient   10/11/2017 1830 10/13/2017 1631 Full Code [id number]  Molt, [place], DO ED   09/13/2017 2028 09/15/2017 2054 Full Code [id number]  Zada Finders, MD Inpatient   01/14/2017 1537 01/17/2017 1813 Full Code [id number]  Rosita Fire, MD ED   04/24/2016 1654 04/25/2016 1323 Full Code [id number]  Lorretta Harp, MD Inpatient   04/06/2016 1407 04/08/2016 1259 Full Code [id number]  Cheryln Manly, NP Inpatient   03/16/2016 1254 03/17/2016 1537 Full Code [id number]  Leanor Kail, [place] Inpatient   02/21/2014 0035 02/21/2014 1933 Full Code [id number]  Jones Bales, MD Inpatient      Home/SNF/Other Home  Chief Complaint abdominal pain   Level of Care/Admitting Diagnosis ED Disposition    ED Disposition Condition [place]: [place] [[id number]]  Level of Care: Stepdown [14]  Admit to SDU  based on following criteria: Hemodynamic compromise or significant risk of instability:  Patient requiring short term acute titration and management of vasoactive drips, and invasive monitoring (i.e., CVP and Arterial line).  Diagnosis: Sepsis [facility]) [[id number]]  Admitting Physician: Georgette Shell [[id number]]  Attending Physician: Georgette Shell [[id number]]  Estimated length of stay: 3 - 4 days  Certification:: I certify this patient will need inpatient services for at least 2 midnights  PT Class (Do Not Modify): Inpatient [101]  PT Acc Code (Do Not Modify): Private [1]       Medical History Past Medical History:  Diagnosis Date  . Anemia   . Arthritis    "left shoulder" (04/07/2016)  . Blind    s/p bilateral corneal transplant; "legally blind in both eyes" (04/07/2016)  . Cancer ([place])    lung  . CKD (chronic kidney disease), stage III ([place])   . COPD (chronic obstructive pulmonary disease) ([place])    pt denies  . DVT (deep venous thrombosis) ([place]) "years ago"   "right thigh"    . HEARING LOSS   . Hyperlipidemia   . Hypertension   . PAD (peripheral artery disease) ([place])    a. 03/2016 Periph Angio: L SFA 99p, 69mw/ one vessel runoff. R SFA 1034m/ one vessel runofff;  b. 04/07/2016 PTA L SFA ([place] 1 LS atherecromty w/ DEBA). c. 04/24/16 s/p PTA to R SFA, right tibioperoneal trunk and  posterior tibial artery.  . Phlebitis   . Squamous cell carcinoma lung ([place])   . Tobacco abuse     Allergies Allergies  Allergen Reactions  . Codeine Other (See Comments)    REACTION: Pt c/o "feeling high"  . Morphine Other (See Comments)    REACTION: feels funny and "high"    IV Location/Drains/Wounds Patient Lines/Drains/Airways Status   Active Line/Drains/Airways    Name:   Placement date:   Placement time:   Site:   Days:   Peripheral IV 11/24/17 Right;Posterior Wrist   11/24/17    2019    Wrist   1   Peripheral IV 11/24/17 Right Forearm   11/24/17    2314    Forearm   1    Incision (Closed) 01/16/17 Abdomen   01/16/17    1027     313   Incision - 4 Ports Abdomen Right;Lateral Right;Medial Umbilicus Mid;Upper   88/41/66    0630     313          Labs/Imaging Results for orders placed or performed during the hospital encounter of 11/24/17 (from the past 48 hour(s))  Lipase, blood     Status: None   Collection Time: 11/24/17  8:33 PM  Result Value Ref Range   Lipase 20 11 - 51 U/L    Comment: Performed at [facility], [place] [address], [place], [place] [address]  Comprehensive metabolic panel     Status: Abnormal   Collection Time: 11/24/17  8:33 PM  Result Value Ref Range   Sodium 140 135 - 145 mmol/L   Potassium 4.0 3.5 - 5.1 mmol/L   Chloride 109 98 - 111 mmol/L    Comment: Please note change in reference range.   CO2 23 22 - 32 mmol/L   Glucose, Bld 166 (H) 70 - 99 mg/dL    Comment: Please note change in reference range.   BUN 36 (H) 8 - 23 mg/dL    Comment: Please note change in reference range.   Creatinine, Ser 2.06 (H) 0.44 - 1.00 mg/dL   Calcium 8.5 (L) 8.9 - 10.3 mg/dL   Total Protein 5.6 (L) 6.5 - 8.1 g/dL   Albumin 2.1 (L) 3.5 - 5.0 g/dL   AST 47 (H) 15 - 41 U/L   ALT 42 0 - 44 U/L    Comment: Please note change in reference range.   Alkaline Phosphatase 81 38 - 126 U/L   Total Bilirubin 0.3 0.3 - 1.2 mg/dL   GFR calc non Af Amer 22 (L) >60 mL/min   GFR calc Af Amer 26 (L) >60 mL/min    Comment: (NOTE) The eGFR has been calculated using the CKD EPI equation. This calculation has not been validated in all clinical situations. eGFR's persistently <60 mL/min signify possible Chronic Kidney Disease.    Anion gap 8 5 - 15    Comment: Performed at [facility], [place] [address] [place], [place] [address]  CBC with Differential     Status: Abnormal   Collection Time: 11/24/17  8:33 PM  Result Value Ref Range   WBC 2.7 (L) 4.0 - 10.5 K/uL   RBC 3.46 (L) 3.87 - 5.11 MIL/uL   Hemoglobin 9.8 (L) 12.0 -  15.0 g/dL   HCT 31.6 (L) 36.0 - 46.0 %   MCV 91.3 78.0 - 100.0 fL   MCH 28.3 26.0 - 34.0 pg   MCHC 31.0 30.0 - 36.0 g/dL   RDW 16.6 (  H) 11.5 - 15.5 %   Platelets 119 (L) 150 - 400 K/uL    Comment: PLATELET COUNT CONFIRMED BY SMEAR   Neutrophils Relative % 62 %   Lymphocytes Relative 19 %   Monocytes Relative 17 %   Eosinophils Relative 2 %   Basophils Relative 0 %   Neutro Abs 1.6 (L) 1.7 - 7.7 K/uL   Lymphs Abs 0.5 (L) 0.7 - 4.0 K/uL   Monocytes Absolute 0.5 0.1 - 1.0 K/uL   Eosinophils Absolute 0.1 0.0 - 0.7 K/uL   Basophils Absolute 0.0 0.0 - 0.1 K/uL   Smear Review MORPHOLOGY UNREMARKABLE     Comment: Performed at [facility], [place] [address] [place], [place] [address]  Blood culture (routine x 2)     Status: None (Preliminary result)   Collection Time: 11/24/17  8:33 PM  Result Value Ref Range   Specimen Description      BLOOD RIGHT WRIST Performed at [place] [address] [place], [place] [address]    Special Requests      BOTTLES DRAWN AEROBIC AND ANAEROBIC Blood Culture adequate volume Performed at [place] [address], [place], [place] [address]    Culture      NO GROWTH < 24 HOURS Performed at [place] [address], [place], [place] [address]    Report Status PENDING   I-Stat CG4 Lactic Acid, ED     Status: Abnormal   Collection Time: 11/24/17  8:41 PM  Result Value Ref Range   Lactic Acid, Venous 2.44 (HH) 0.5 - 1.9 mmol/L   Comment NOTIFIED PHYSICIAN   Urinalysis, Routine w reflex microscopic     Status: Abnormal   Collection Time: 11/24/17 11:14 PM  Result Value Ref Range   Color, Urine AMBER (A) YELLOW    Comment: BIOCHEMICALS MAY BE AFFECTED BY COLOR   APPearance CLOUDY (A) CLEAR   Specific Gravity, Urine 1.018 1.005 - 1.030   pH 5.0 5.0 - 8.0   Glucose, UA NEGATIVE NEGATIVE mg/dL   Hgb urine dipstick SMALL (A) NEGATIVE   Bilirubin Urine NEGATIVE NEGATIVE    Ketones, ur NEGATIVE NEGATIVE mg/dL   Protein, ur 30 (A) NEGATIVE mg/dL   Nitrite NEGATIVE NEGATIVE   Leukocytes, UA LARGE (A) NEGATIVE   RBC / HPF 0-5 0 - 5 RBC/hpf   WBC, UA >50 (H) 0 - 5 WBC/hpf   Bacteria, UA MANY (A) NONE SEEN   Squamous Epithelial / LPF 0-5 0 - 5   Budding Yeast PRESENT     Comment: Performed at [facility], [place] [address], [place], [place] [address]  I-Stat CG4 Lactic Acid, ED     Status: None   Collection Time: 11/24/17 11:24 PM  Result Value Ref Range   Lactic Acid, Venous 0.89 0.5 - 1.9 mmol/L  Comprehensive metabolic panel     Status: Abnormal   Collection Time: 11/25/17  6:10 AM  Result Value Ref Range   Sodium 143 135 - 145 mmol/L   Potassium 4.4 3.5 - 5.1 mmol/L   Chloride 111 98 - 111 mmol/L    Comment: Please note change in reference range.   CO2 21 (L) 22 - 32 mmol/L   Glucose, Bld 100 (H) 70 - 99 mg/dL    Comment: Please note change in reference range.   BUN 33 (H) 8 - 23 mg/dL    Comment: Please note change in reference range.   Creatinine,  Ser 1.99 (H) 0.44 - 1.00 mg/dL   Calcium 8.3 (L) 8.9 - 10.3 mg/dL   Total Protein 5.6 (L) 6.5 - 8.1 g/dL   Albumin 2.1 (L) 3.5 - 5.0 g/dL   AST 45 (H) 15 - 41 U/L   ALT 45 (H) 0 - 44 U/L    Comment: Please note change in reference range.   Alkaline Phosphatase 71 38 - 126 U/L   Total Bilirubin 0.7 0.3 - 1.2 mg/dL   GFR calc non Af Amer 23 (L) >60 mL/min   GFR calc Af Amer 27 (L) >60 mL/min    Comment: (NOTE) The eGFR has been calculated using the CKD EPI equation. This calculation has not been validated in all clinical situations. eGFR's persistently <60 mL/min signify possible Chronic Kidney Disease.    Anion gap 11 5 - 15    Comment: Performed at [facility], [place] [address] [place], [place] [address]  CBC WITH DIFFERENTIAL     Status: Abnormal   Collection Time: 11/25/17  6:10 AM  Result Value Ref Range   WBC 4.3 4.0 - 10.5 K/uL   RBC 2.89 (L) 3.87 -  5.11 MIL/uL   Hemoglobin 8.3 (L) 12.0 - 15.0 g/dL   HCT 26.8 (L) 36.0 - 46.0 %   MCV 92.7 78.0 - 100.0 fL   MCH 28.7 26.0 - 34.0 pg   MCHC 31.0 30.0 - 36.0 g/dL   RDW 16.8 (H) 11.5 - 15.5 %   Platelets 136 (L) 150 - 400 K/uL   Neutrophils Relative % 70 %   Lymphocytes Relative 14 %   Monocytes Relative 15 %   Eosinophils Relative 1 %   Basophils Relative 0 %   Neutro Abs 3.1 1.7 - 7.7 K/uL   Lymphs Abs 0.6 (L) 0.7 - 4.0 K/uL   Monocytes Absolute 0.6 0.1 - 1.0 K/uL   Eosinophils Absolute 0.0 0.0 - 0.7 K/uL   Basophils Absolute 0.0 0.0 - 0.1 K/uL   WBC Morphology MILD LEFT SHIFT (1-5% METAS, OCC MYELO, OCC BANDS)     Comment: DOHLE BODIES Performed at [facility], [place] [address], [place], [place] [address]   CBC     Status: Abnormal   Collection Time: 11/25/17 11:12 AM  Result Value Ref Range   WBC 4.0 4.0 - 10.5 K/uL   RBC 2.65 (L) 3.87 - 5.11 MIL/uL   Hemoglobin 7.6 (L) 12.0 - 15.0 g/dL   HCT 24.9 (L) 36.0 - 46.0 %   MCV 94.0 78.0 - 100.0 fL   MCH 28.7 26.0 - 34.0 pg   MCHC 30.5 30.0 - 36.0 g/dL   RDW 17.0 (H) 11.5 - 15.5 %   Platelets 134 (L) 150 - 400 K/uL    Comment: Performed at [facility], [place] [address] [place], [place] [address]  Comprehensive metabolic panel     Status: Abnormal   Collection Time: 11/25/17 11:12 AM  Result Value Ref Range   Sodium 141 135 - 145 mmol/L   Potassium 4.7 3.5 - 5.1 mmol/L   Chloride 113 (H) 98 - 111 mmol/L    Comment: Please note change in reference range.   CO2 20 (L) 22 - 32 mmol/L   Glucose, Bld 113 (H) 70 - 99 mg/dL    Comment: Please note change in reference range.   BUN 35 (H) 8 - 23 mg/dL    Comment: Please note change in reference range.   Creatinine, Ser 2.07 (H) 0.44 -  1.00 mg/dL   Calcium 8.0 (L) 8.9 - 10.3 mg/dL   Total Protein 5.4 (L) 6.5 - 8.1 g/dL   Albumin 2.0 (L) 3.5 - 5.0 g/dL   AST 40 15 - 41 U/L   ALT 42 0 - 44 U/L    Comment: Please note change in reference range.    Alkaline Phosphatase 64 38 - 126 U/L   Total Bilirubin 0.7 0.3 - 1.2 mg/dL   GFR calc non Af Amer 22 (L) >60 mL/min   GFR calc Af Amer 25 (L) >60 mL/min    Comment: (NOTE) The eGFR has been calculated using the CKD EPI equation. This calculation has not been validated in all clinical situations. eGFR's persistently <60 mL/min signify possible Chronic Kidney Disease.    Anion gap 8 5 - 15    Comment: Performed at [facility], [place] [address] [place], [place] [address]  D-dimer, quantitative (not at [facility])     Status: Abnormal   Collection Time: 11/25/17 11:13 AM  Result Value Ref Range   D-Dimer, Quant 3.63 (H) 0.00 - 0.50 ug/mL-FEU    Comment: (NOTE) At the manufacturer cut-off of 0.50 ug/mL FEU, this assay has been documented to exclude PE with a sensitivity and negative predictive value of 97 to 99%.  At this time, this assay has not been approved by the FDA to exclude DVT/VTE. Results should be correlated with clinical presentation. Performed at [facility], [place] [address], [place], [place] [address]   Lactic acid, plasma     Status: None   Collection Time: 11/25/17  3:19 PM  Result Value Ref Range   Lactic Acid, Venous 1.5 0.5 - 1.9 mmol/L    Comment: Performed at [facility], [place] [address] [place], [place] [address]   Ct Abdomen Pelvis Wo Contrast  Result Date: 11/24/2017 CLINICAL DATA:  Diffuse abdominal pain for a day, diarrhea. History of lung cancer, cholecystectomy. EXAM: CT ABDOMEN AND PELVIS WITHOUT CONTRAST TECHNIQUE: Multidetector CT imaging of the abdomen and pelvis was performed following the standard protocol without IV contrast. COMPARISON:  CT abdomen and pelvis November 19, 2017 FINDINGS: LOWER CHEST: Similar small RIGHT pleural effusions. Numerous pulmonary nodules with subpleural reticulation. Heart size is normal. Severe coronary artery calcifications. Similar pericardial wall thickening.  HEPATOBILIARY: Status post cholecystectomy. Negative non-contrast CT liver. PANCREAS: Normal. SPLEEN: Normal. ADRENALS/URINARY TRACT: Kidneys are orthotopic, demonstrating normal size. RIGHT renal scarring. Bilateral benign-appearing renal cysts measuring to 6.1 cm on the LEFT. No nephrolithiasis, hydronephrosis; limited assessment for renal masses by nonenhanced CT. The unopacified ureters are normal in course and caliber. Urinary bladder is adequately distended, mild prolapse. Normal adrenal glands. STOMACH/BOWEL: The stomach, small and large bowel are normal in course and caliber without inflammatory changes, sensitivity decreased by lack of enteric contrast. Minimal small bowel diverticula. No significant retained large bowel stool. The appendix is not discretely identified, however there are no inflammatory changes in the right lower quadrant. VASCULAR/LYMPHATIC: Aortoiliac vessels are normal in course and caliber. Severe calcific atherosclerosis. Subcentimeter similar lymph nodes are cardio phrenic angle, potentially metastatic. REPRODUCTIVE: Status post hysterectomy. OTHER: No intraperitoneal free fluid or free air. MUSCULOSKELETAL: Non-acute. Old mild T12, moderate L3 compression fractures. Osteopenia. Severe upper lumbar facet arthropathy. IMPRESSION: 1. No acute intra-abdominal or pelvic process by noncontrast CT. 2. Similar small RIGHT pleural effusion. 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 Aortic Atherosclerosis (ICD10-I70.0). Electronically Signed   By: Thana Farr.D.  On: 11/24/2017 23:02   Dg Chest 2 View  Result Date: 11/24/2017 CLINICAL DATA:  Fever.  Lung cancer. EXAM: CHEST - 2 VIEW COMPARISON:  11/21/2017 FINDINGS: Right upper lobe density unchanged consistent with mass and infiltrate. Mild right lower lobe airspace disease also unchanged. Negative for heart failure. Left lung remains clear IMPRESSION: No significant change  right upper lobe infiltrate and mass and mild right lower lobe airspace disease. Electronically Signed   By: Franchot Gallo M.D.   On: 11/24/2017 21:11    Pending Labs Unresulted Labs (From admission, onward)   Start     Ordered   11/25/17 1725  Type and screen [place]  Once,   R    Comments:  [place]    11/25/17 1724   11/25/17 1725  Prepare RBC  (Adult Blood Administration - Red Blood Cells)  Once,   R    Question Answer Comment  # of Units 1 unit   Transfusion Indications Symptomatic Anemia   If emergent release call blood bank [facility] [phone]      11/25/17 1724   11/25/17 1459  Lactic acid, plasma  STAT Now then every 3 hours,   R     11/25/17 1458   11/25/17 1450  C difficile quick scan w PCR reflex  (C Difficile quick screen w PCR reflex panel)  Once, for 24 hours,   R     11/25/17 1449   11/25/17 1450  Gastrointestinal Panel by PCR , Stool  (Gastrointestinal Panel by PCR, Stool)  Once,   R     11/25/17 1449   11/25/17 1010  Legionella Pneumophila Serogp 1 Ur Ag  Once,   R     11/25/17 1009   11/24/17 2336  Urine culture  STAT,   STAT     11/24/17 2336   11/24/17 2024  Blood culture (routine x 2)  BLOOD CULTURE X 2,   STAT     11/24/17 2023      Vitals/Pain Today's Vitals   11/25/17 1645 11/25/17 1700 11/25/17 1715 11/25/17 1717  BP: (!) 88/54 (!) 106/56 93/66   Pulse: 87 98  96  Resp: (!) '22 20 14 19  ' Temp:      TempSrc:      SpO2: 100% 99%  98%  Weight:      Height:      PainSc:        Isolation Precautions Enteric precautions (UV disinfection)  Medications Medications  atorvastatin (LIPITOR) tablet 40 mg (40 mg Oral Given 11/25/17 1047)  clopidogrel (PLAVIX) tablet 75 mg (75 mg Oral Given 11/25/17 0832)  acetaminophen (TYLENOL) tablet 650 mg (650 mg Oral Given 11/25/17 1124)    Or  acetaminophen (TYLENOL) suppository 650 mg ( Rectal See Alternative 11/25/17 1124)  ondansetron (ZOFRAN) tablet 4 mg (has  no administration in time range)    Or  ondansetron (ZOFRAN) injection 4 mg (has no administration in time range)  0.9 %  sodium chloride infusion ( Intravenous New Bag/Given 11/25/17 1414)  vancomycin (VANCOCIN) IVPB 750 mg/150 ml premix (has no administration in time range)  ceFEPIme (MAXIPIME) 1 g in sodium chloride 0.9 % 100 mL IVPB (0 g Intravenous Stopped 11/25/17 0746)  albuterol (PROVENTIL,VENTOLIN) solution continuous neb (has no administration in time range)  0.9 %  sodium chloride infusion (Manually program via Guardrails IV Fluids) (has no administration in time range)  ipratropium-albuterol (DUONEB) 0.5-2.5 (3) MG/3ML nebulizer solution 3 mL (3 mLs  Nebulization Given 11/24/17 2124)  piperacillin-tazobactam (ZOSYN) IVPB 3.375 g (0 g Intravenous Stopped 11/24/17 2206)  sodium chloride 0.9 % bolus 1,000 mL (0 mLs Intravenous Stopped 11/24/17 2343)  sodium chloride 0.9 % bolus 1,000 mL (0 mLs Intravenous Stopped 11/25/17 0142)  albuterol (PROVENTIL) (2.5 MG/3ML) 0.083% nebulizer solution 5 mg (5 mg Nebulization Given 11/24/17 2327)  ipratropium (ATROVENT) nebulizer solution 0.5 mg (0.5 mg Nebulization Given 11/24/17 2327)  vancomycin (VANCOCIN) IVPB 1000 mg/200 mL premix (0 mg Intravenous Stopped 11/25/17 0718)    Mobility non-ambulatory

## 2017-11-26 ENCOUNTER — Inpatient Hospital Stay (HOSPITAL_COMMUNITY): Payer: Medicare Other

## 2017-11-26 DIAGNOSIS — N390 Urinary tract infection, site not specified: Secondary | ICD-10-CM

## 2017-11-26 DIAGNOSIS — A419 Sepsis, unspecified organism: Principal | ICD-10-CM

## 2017-11-26 DIAGNOSIS — N179 Acute kidney failure, unspecified: Secondary | ICD-10-CM

## 2017-11-26 DIAGNOSIS — D61818 Other pancytopenia: Secondary | ICD-10-CM

## 2017-11-26 DIAGNOSIS — R0902 Hypoxemia: Secondary | ICD-10-CM

## 2017-11-26 LAB — GASTROINTESTINAL PANEL BY PCR, STOOL (REPLACES STOOL CULTURE)
Adenovirus F40/41: NOT DETECTED
Astrovirus: NOT DETECTED
CRYPTOSPORIDIUM: NOT DETECTED
CYCLOSPORA CAYETANENSIS: NOT DETECTED
Campylobacter species: NOT DETECTED
Entamoeba histolytica: NOT DETECTED
Enteroaggregative E coli (EAEC): NOT DETECTED
Enteropathogenic E coli (EPEC): NOT DETECTED
Enterotoxigenic E coli (ETEC): DETECTED — AB
Giardia lamblia: NOT DETECTED
Norovirus GI/GII: NOT DETECTED
Plesimonas shigelloides: NOT DETECTED
Rotavirus A: NOT DETECTED
SALMONELLA SPECIES: NOT DETECTED
SAPOVIRUS (I, II, IV, AND V): NOT DETECTED
SHIGA LIKE TOXIN PRODUCING E COLI (STEC): NOT DETECTED
Shigella/Enteroinvasive E coli (EIEC): NOT DETECTED
Vibrio cholerae: NOT DETECTED
Vibrio species: NOT DETECTED
YERSINIA ENTEROCOLITICA: NOT DETECTED

## 2017-11-26 LAB — URINE CULTURE: Culture: >=100000 — AB

## 2017-11-26 LAB — BASIC METABOLIC PANEL
ANION GAP: 9 (ref 5–15)
BUN: 34 mg/dL — AB (ref 8–23)
CO2: 16 mmol/L — ABNORMAL LOW (ref 22–32)
Calcium: 8.6 mg/dL — ABNORMAL LOW (ref 8.9–10.3)
Chloride: 113 mmol/L — ABNORMAL HIGH (ref 98–111)
Creatinine, Ser: 2.15 mg/dL — ABNORMAL HIGH (ref 0.44–1.00)
GFR calc Af Amer: 24 mL/min — ABNORMAL LOW (ref >60)
GFR, EST NON AFRICAN AMERICAN: 21 mL/min — AB (ref >60)
GLUCOSE: 115 mg/dL — AB (ref 70–99)
Potassium: 4.4 mmol/L (ref 3.5–5.1)
Sodium: 138 mmol/L (ref 135–145)

## 2017-11-26 LAB — C DIFFICILE QUICK SCREEN W PCR REFLEX
C Diff antigen: NEGATIVE
C Diff interpretation: NOT DETECTED
C Diff toxin: NEGATIVE

## 2017-11-26 LAB — LEGIONELLA PNEUMOPHILA SEROGP 1 UR AG: L. pneumophila Serogp 1 Ur Ag: NEGATIVE

## 2017-11-26 LAB — ABO/RH: ABO/RH(D): O NEG

## 2017-11-26 LAB — HEMOGLOBIN AND HEMATOCRIT, BLOOD
HEMATOCRIT: 29.3 % — AB (ref 36.0–46.0)
HEMOGLOBIN: 9.2 g/dL — AB (ref 12.0–15.0)

## 2017-11-26 MED ORDER — ALUM & MAG HYDROXIDE-SIMETH 200-200-20 MG/5ML PO SUSP
30.0000 mL | ORAL | Status: DC | PRN
  Administered 2017-11-26: 30 mL via ORAL
  Filled 2017-11-26: qty 30

## 2017-11-26 MED ORDER — TRAMADOL HCL 50 MG PO TABS
50.0000 mg | ORAL_TABLET | Freq: Four times a day (QID) | ORAL | Status: DC | PRN
  Administered 2017-11-26 – 2017-11-28 (×4): 50 mg via ORAL
  Filled 2017-11-26 (×4): qty 1

## 2017-11-26 NOTE — Consult Note (Addendum)
   [facility] CM Inpatient Consult   11/26/2017  [name] [birth year] [id number]   [name] is active with [place] [facility] program. She is followed by [place] [facility] Social Worker.   [name] is currently in stepdown unit at [facility]. Notification sent to make inpatient RNCM aware [facility] is active.   Will continue to follow along during hospitalization.   Marthenia Rolling, MSN-Ed, RN,BSN [facility] Liaison [phone]

## 2017-11-26 NOTE — Progress Notes (Signed)
PROGRESS NOTE    [name]  HAL:[id number] DOB: 08/21/1940 DOA: 11/24/2017 PCP: Sid Falcon, MD   Brief Narrative: 77 y.o. female with recently diagnosed non-small cell lung cancer had chemotherapy last week he was complaining of abdominal pain burning sensation with diarrhea.  Patient has had recent multiple ED visits complaining of chronic pain and chest pain.  She had some fever prior to arrival.  Pain in the abdomen is generalized with some loose bowel movement.  Patient is legally blind and difficulty here.  ED Course: In the ER patient was hypotensive febrile tachycardic with elevated lactate concerning for sepsis.  Patient was given fluid bolus and started on empiric antibiotics.  UA shows features concerning for UTI.  Chest x-ray shows possible infiltrates.  CT abdomen was unremarkable.  On exam patient is not in distress and abdomen appears benign on exam.  Will admit for sepsis likely from UTI and possible pneumonia.    Assessment & Plan:   Principal Problem:   Sepsis ([place]) Active Problems:   ARF (acute renal failure) ([place])   Acute lower UTI   Diarrhea   Pancytopenia ([facility])  1]sepsis probably secondary to pneumonia though your UA appears dirty.  Patient presented with abdominal pain diarrhea and fever status post recent chemotherapy.  Told by empiric antibiotics.  Urine culture blood culture pending.  Chest x-ray repeated done yesterday shows possible infiltrates versus edema versus atelectasis.  In the ER patient was hypotensive tachycardic febrile he was also found to have elevated lactic acid level.  Hypotension improved after blood transfusion.  2]aki s/p ivf.monitor.  We will hold off on any further hydration due to possible fluid overload by chest x-ray.  Chest x-ray today  3] pancytopenia/anemia recent chemo related.  Status post 1 unit of blood transfusion.  Hemoglobin up to 9.2.  4]NSCLA S/P CHEMO  5] peripheral vascular disease continue plavix.    DVT  prophylaxis:scd Code Status: partial Family Communication:none Disposition Plan: tbd Consultants:  none  Procedures:none Antimicrobials none  Subjective:awake alert feels better..   Objective: Vitals:   11/26/17 1000 11/26/17 1100 11/26/17 1200 11/26/17 1300  BP: (!) 108/55 (!) 99/40 (!) 93/43 (!) 92/36  Pulse:      Resp: 20 (!) 22 (!) 21 17  Temp:      TempSrc:      SpO2: 97% 98% 95% 98%  Weight:      Height:        Intake/Output Summary (Last 24 hours) at 11/26/2017 1507 Last data filed at 11/26/2017 0516 Gross per 24 hour  Intake 315 ml  Output 175 ml  Net 140 ml   Filed Weights   11/24/17 1955 11/25/17 1815  Weight: 57.6 kg (127 lb) 60.5 kg (133 lb 6.1 oz)    Examination:  General exam: Appears calm and comfortable  Respiratory system: Clear to auscultation. Respiratory effort normal. Cardiovascular system: S1 & S2 heard, RRR. No JVD, murmurs, rubs, gallops or clicks. No pedal edema. Gastrointestinal system: Abdomen is nondistended, soft and nontender. No organomegaly or masses felt. Normal bowel sounds heard. Central nervous system: Alert and oriented. No focal neurological deficits. Extremities: trace edema Skin: No rashes, lesions or ulcers Psychiatry: Judgement and insight appear normal. Mood & affect appropriate.     Data Reviewed: I have personally reviewed following labs and imaging studies  CBC: Recent Labs  Lab 11/21/17 0423 11/24/17 2033 11/25/17 0610 11/25/17 1112 11/26/17 0726  WBC 54.8* 2.7* 4.3 4.0  --   NEUTROABS 53.8* 1.6*  3.1  --   --   HGB 11.0* 9.8* 8.3* 7.6* 9.2*  HCT 35.3* 31.6* 26.8* 24.9* 29.3*  MCV 93.4 91.3 92.7 94.0  --   PLT 310 119* 136* 134*  --    Basic Metabolic Panel: Recent Labs  Lab 11/21/17 0423 11/24/17 2033 11/25/17 0610 11/25/17 1112 11/26/17 0749  NA 141 140 143 141 138  K 4.6 4.0 4.4 4.7 4.4  CL 106 109 111 113* 113*  CO2 21* 23 21* 20* 16*  GLUCOSE 122* 166* 100* 113* 115*  BUN 25* 36* 33* 35*  34*  CREATININE 1.48* 2.06* 1.99* 2.07* 2.15*  CALCIUM 9.0 8.5* 8.3* 8.0* 8.6*   GFR: Estimated Creatinine Clearance: 18.9 mL/min (A) (by C-G formula based on SCr of 2.15 mg/dL (H)). Liver Function Tests: Recent Labs  Lab 11/24/17 2033 11/25/17 0610 11/25/17 1112  AST 47* 45* 40  ALT 42 45* 42  ALKPHOS 81 71 64  BILITOT 0.3 0.7 0.7  PROT 5.6* 5.6* 5.4*  ALBUMIN 2.1* 2.1* 2.0*   Recent Labs  Lab 11/24/17 2033  LIPASE 20   No results for input(s): AMMONIA in the last 168 hours. Coagulation Profile: No results for input(s): INR, PROTIME in the last 168 hours. Cardiac Enzymes: No results for input(s): CKTOTAL, CKMB, CKMBINDEX, TROPONINI in the last 168 hours. BNP (last 3 results) No results for input(s): PROBNP in the last 8760 hours. HbA1C: No results for input(s): HGBA1C in the last 72 hours. CBG: No results for input(s): GLUCAP in the last 168 hours. Lipid Profile: No results for input(s): CHOL, HDL, LDLCALC, TRIG, CHOLHDL, LDLDIRECT in the last 72 hours. Thyroid Function Tests: No results for input(s): TSH, T4TOTAL, FREET4, T3FREE, THYROIDAB in the last 72 hours. Anemia Panel: No results for input(s): VITAMINB12, FOLATE, FERRITIN, TIBC, IRON, RETICCTPCT in the last 72 hours. Sepsis Labs: Recent Labs  Lab 11/24/17 2041 11/24/17 2324 11/25/17 1519 11/25/17 1822  LATICACIDVEN 2.44* 0.89 1.5 1.5    Recent Results (from the past 240 hour(s))  Blood culture (routine x 2)     Status: None (Preliminary result)   Collection Time: 11/24/17  8:33 PM  Result Value Ref Range Status   Specimen Description   Final    BLOOD RIGHT WRIST Performed at [facility] [address] [place], [place] [address]    Special Requests   Final    BOTTLES DRAWN AEROBIC AND ANAEROBIC Blood Culture adequate volume Performed at [facility] [address] [place], [place] [address]    Culture   Final    NO GROWTH < 24 HOURS Performed at [facility] [address], [place], [place] [address]    Report Status PENDING  Incomplete  Urine culture     Status: None (Preliminary result)   Collection Time: 11/24/17 11:36 PM  Result Value Ref Range Status   Specimen Description   Final    URINE, CLEAN CATCH Performed at [facility], [place] [address] [place], [place] [address]    Special Requests   Final    NONE Performed at [facility], [place] [address] [place], [place] [address]    Culture   Final    CULTURE REINCUBATED FOR BETTER GROWTH Performed at [facility], [place] [address] [place],  [address]    Report Status PENDING  Incomplete  MRSA PCR Screening     Status: None   Collection Time: 11/25/17  6:18 PM  Result  Value Ref Range Status   MRSA by PCR NEGATIVE NEGATIVE Final    Comment:        The GeneXpert MRSA Assay (FDA approved for NASAL specimens only), is one component of a comprehensive MRSA colonization surveillance program. It is not intended to diagnose MRSA infection nor to guide or monitor treatment for MRSA infections. Performed at [facility], [place] [address], [place], [place] [address]   C difficile quick scan w PCR reflex     Status: None   Collection Time: 11/26/17  4:06 AM  Result Value Ref Range Status   C Diff antigen NEGATIVE NEGATIVE Final   C Diff toxin NEGATIVE NEGATIVE Final   C Diff interpretation No C. difficile detected.  Final    Comment: Performed at [facility], [place] [address] [place], [place] [address]         Radiology Studies: Ct Abdomen Pelvis Wo Contrast  Result Date: 11/24/2017 CLINICAL DATA:  Diffuse abdominal pain for a day, diarrhea. History of lung cancer, cholecystectomy. EXAM: CT ABDOMEN AND PELVIS WITHOUT CONTRAST TECHNIQUE: Multidetector CT imaging of the abdomen and pelvis was performed following the standard protocol without IV contrast. COMPARISON:  CT abdomen and  pelvis November 19, 2017 FINDINGS: LOWER CHEST: Similar small RIGHT pleural effusions. Numerous pulmonary nodules with subpleural reticulation. Heart size is normal. Severe coronary artery calcifications. Similar pericardial wall thickening. HEPATOBILIARY: Status post cholecystectomy. Negative non-contrast CT liver. PANCREAS: Normal. SPLEEN: Normal. ADRENALS/URINARY TRACT: Kidneys are orthotopic, demonstrating normal size. RIGHT renal scarring. Bilateral benign-appearing renal cysts measuring to 6.1 cm on the LEFT. No nephrolithiasis, hydronephrosis; limited assessment for renal masses by nonenhanced CT. The unopacified ureters are normal in course and caliber. Urinary bladder is adequately distended, mild prolapse. Normal adrenal glands. STOMACH/BOWEL: The stomach, small and large bowel are normal in course and caliber without inflammatory changes, sensitivity decreased by lack of enteric contrast. Minimal small bowel diverticula. No significant retained large bowel stool. The appendix is not discretely identified, however there are no inflammatory changes in the right lower quadrant. VASCULAR/LYMPHATIC: Aortoiliac vessels are normal in course and caliber. Severe calcific atherosclerosis. Subcentimeter similar lymph nodes are cardio phrenic angle, potentially metastatic. REPRODUCTIVE: Status post hysterectomy. OTHER: No intraperitoneal free fluid or free air. MUSCULOSKELETAL: Non-acute. Old mild T12, moderate L3 compression fractures. Osteopenia. Severe upper lumbar facet arthropathy. IMPRESSION: 1. No acute intra-abdominal or pelvic process by noncontrast CT. 2. Similar small RIGHT pleural effusion. 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 Aortic Atherosclerosis (ICD10-I70.0). Electronically Signed   By: Elon Alas M.D.   On: 11/24/2017 23:02   Dg Chest 1 View  Result Date: 11/25/2017 CLINICAL DATA:  77 year old female with a history of wheezing and  hypertension EXAM: CHEST  1 VIEW COMPARISON:  11/24/2017, chest CT 11/15/2017 FINDINGS: Cardiomediastinal silhouette likely unchanged with the borders partially obscured by overlying lung and pleural disease. Worsening opacity at the right lung base with mixed interstitial and airspace opacity obscuring the right hemidiaphragm. Blunting of the right costophrenic angle. Worsening opacity at the left lung base with partial obscuration left hemidiaphragm and left heart border. Similar appearance of opacity at the right upper lobe, with pleuroparenchymal thickening, extending to the hilum. Calcifications of the aortic arch. IMPRESSION: Worsening opacities of the right greater than left lung base, potentially combination of worsening edema, pleural fluid, atelectasis/consolidation. Aspiration may be considered. Redemonstration of opacity of the right upper lobe extending from the hilum to the apex in this patient with  known cancer. Electronically Signed   By: Corrie Mckusick D.O.   On: 11/25/2017 17:53   Dg Chest 2 View  Result Date: 11/24/2017 CLINICAL DATA:  Fever.  Lung cancer. EXAM: CHEST - 2 VIEW COMPARISON:  11/21/2017 FINDINGS: Right upper lobe density unchanged consistent with mass and infiltrate. Mild right lower lobe airspace disease also unchanged. Negative for heart failure. Left lung remains clear IMPRESSION: No significant change right upper lobe infiltrate and mass and mild right lower lobe airspace disease. Electronically Signed   By: Franchot Gallo M.D.   On: 11/24/2017 21:11        Scheduled Meds: . sodium chloride   Intravenous Once  . albuterol  2.5 mg Nebulization Q6H  . atorvastatin  40 mg Oral Daily  . clopidogrel  75 mg Oral Q breakfast   Continuous Infusions: . ceFEPime (MAXIPIME) IV Stopped (11/26/17 0516)  . [START ON 11/27/2017] vancomycin       LOS: 1 day     Georgette Shell, MD [facility]  If 7PM-7AM, please contact  night-coverage [email or web address] Password [facility] 11/26/2017, 3:07 PM

## 2017-11-27 ENCOUNTER — Inpatient Hospital Stay (HOSPITAL_COMMUNITY): Payer: Medicare Other

## 2017-11-27 ENCOUNTER — Ambulatory Visit: Payer: Self-pay

## 2017-11-27 DIAGNOSIS — I340 Nonrheumatic mitral (valve) insufficiency: Secondary | ICD-10-CM

## 2017-11-27 DIAGNOSIS — I361 Nonrheumatic tricuspid (valve) insufficiency: Secondary | ICD-10-CM

## 2017-11-27 LAB — TYPE AND SCREEN
ABO/RH(D): O NEG
ANTIBODY SCREEN: NEGATIVE
UNIT DIVISION: 0

## 2017-11-27 LAB — BASIC METABOLIC PANEL
ANION GAP: 9 (ref 5–15)
BUN: 36 mg/dL — AB (ref 8–23)
CALCIUM: 8.7 mg/dL — AB (ref 8.9–10.3)
CO2: 18 mmol/L — ABNORMAL LOW (ref 22–32)
CREATININE: 2.29 mg/dL — AB (ref 0.44–1.00)
Chloride: 111 mmol/L (ref 98–111)
GFR calc Af Amer: 23 mL/min — ABNORMAL LOW (ref >60)
GFR, EST NON AFRICAN AMERICAN: 19 mL/min — AB (ref >60)
Glucose, Bld: 108 mg/dL — ABNORMAL HIGH (ref 70–99)
Potassium: 4.4 mmol/L (ref 3.5–5.1)
Sodium: 138 mmol/L (ref 135–145)

## 2017-11-27 LAB — BPAM RBC
Blood Product Expiration Date: 201908172359
ISSUE DATE / TIME: 201907180007
Unit Type and Rh: 9500

## 2017-11-27 LAB — ECHOCARDIOGRAM COMPLETE
Height: 64 in
Weight: 2134.05 oz

## 2017-11-27 LAB — ACID FAST CULTURE WITH REFLEXED SENSITIVITIES: ACID FAST CULTURE - AFSCU3: NEGATIVE

## 2017-11-27 MED ORDER — FUROSEMIDE 10 MG/ML IJ SOLN
80.0000 mg | Freq: Once | INTRAMUSCULAR | Status: AC
  Administered 2017-11-27: 80 mg via INTRAVENOUS
  Filled 2017-11-27: qty 8

## 2017-11-27 MED ORDER — FUROSEMIDE 10 MG/ML IJ SOLN
10.0000 mg | Freq: Once | INTRAMUSCULAR | Status: AC
  Administered 2017-11-27: 10 mg via INTRAVENOUS
  Filled 2017-11-27: qty 2
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
## 2017-11-27 NOTE — Progress Notes (Addendum)
PROGRESS NOTE    [name]  YOV:[id number] DOB: [birth year] DOA: 11/24/2017 PCP: Sid Falcon, MD   Brief Narrative:76 y.o.femalewithrecently diagnosed non-small cell lung cancer had chemotherapy last week he was complaining of abdominal pain burning sensation with diarrhea. Patient has had recent multiple ED visits complaining of chronic pain and chest pain. She had some fever prior to arrival. Pain in the abdomen is generalized with some loose bowel movement. Patient is legally blind and difficulty here.  ED Course:In the ER patient was hypotensive febrile tachycardic with elevated lactate concerning for sepsis. Patient was given fluid bolus and started on empiric antibiotics. UA shows features concerning for UTI. Chest x-ray shows possible infiltrates.CT abdomen was unremarkable. On exam patient is not in distress and abdomen appears benign on exam. Will admit for sepsis likely from UTIand possible pneumonia.    Assessment & Plan:   Principal Problem:   Sepsis (Minor) Active Problems:   ARF (acute renal failure) (HCC)   Acute lower UTI   Diarrhea   Pancytopenia (HCC)   Hypoxia  1]Sepsis secondary to pneumonia appears to be resolving.  Patient afebrile normal white count she does have a labile blood pressure.Urine culture lactobacillus probably contaminant.Stool studies show Entero toxigenic E coli .Since she is not neutropenic and have no diarrhea,will hold off on treatment.continue Cefepime.Chest Xray 11/26/2017 after IV hydration with increasing upper lobe consolidation right lower lobe opacity stable moderate right pleural effusion which appears increased, left lung changes appear stable normal heart size.  Patient received over 3 L of IV fluids at the time of admission due to hypotension which is probably because of his pleural effusion.  Patient has not had an echo since 2011 will  order an echo.  2]AKI-ATN vs Contrast vs interstitial nephritis vs chemo  related.worsening renal function since the beginning of this month inspite of iv hydration.will consult renal.  3]Pancytopenia s/p chemo.  4]lung ca s/p recent chemo.dr Julien Nordmann  5] anemia of chronic disease.  Status post 1 unit of blood transfusion.        DVT prophylaxis: SCD Code Status: Partial code Family Communication: None  Disposition Plan: TBD Consultants:  Renal Spoke to ID on the phone  Procedures: None cefepime cefepime Antimicrobials:  Subjective: Resting in bed saturation 93% on room air in no acute distress very hard of hearing but answers all questions appropriately.  She does appear to be short of breath but she denies shortness of breath.  Denies chest pain no nausea vomiting or diarrhea.   Objective: Vitals:   11/27/17 0148 11/27/17 0200 11/27/17 0400 11/27/17 0809  BP:  (!) 87/37    Pulse:      Resp:  17    Temp:   99 F (37.2 C) 99.7 F (37.6 C)  TempSrc:   Oral Oral  SpO2: 99% 97%    Weight:      Height:        Intake/Output Summary (Last 24 hours) at 11/27/2017 1007 Last data filed at 11/27/2017 0500 Gross per 24 hour  Intake -  Output 200 ml  Net -200 ml   Filed Weights   11/24/17 1955 11/25/17 1815  Weight: 57.6 kg (127 lb) 60.5 kg (133 lb 6.1 oz)    Examination:  General exam: Appears calm and comfortable  Respiratory system:crackles and scattered rhonchi  auscultation. Respiratory effort normal. Cardiovascular system: S1 & S2 heard, RRR. No JVD, murmurs, rubs, gallops or clicks. No pedal edema. Gastrointestinal system: Abdomen is nondistended, soft and nontender.  No organomegaly or masses felt. Normal bowel sounds heard. Central nervous system: Alert and oriented. No focal neurological deficits. Extremities: Symmetric 5 x 5 power. Skin: No rashes, lesions or ulcers Psychiatry: Judgement and insight appear normal. Mood & affect appropriate.     Data Reviewed: I have personally reviewed following labs and imaging  studies  CBC: Recent Labs  Lab 11/21/17 0423 11/24/17 2033 11/25/17 0610 11/25/17 1112 11/26/17 0726  WBC 54.8* 2.7* 4.3 4.0  --   NEUTROABS 53.8* 1.6* 3.1  --   --   HGB 11.0* 9.8* 8.3* 7.6* 9.2*  HCT 35.3* 31.6* 26.8* 24.9* 29.3*  MCV 93.4 91.3 92.7 94.0  --   PLT 310 119* 136* 134*  --    Basic Metabolic Panel: Recent Labs  Lab 11/24/17 2033 11/25/17 0610 11/25/17 1112 11/26/17 0749 11/27/17 0730  NA 140 143 141 138 138  K 4.0 4.4 4.7 4.4 4.4  CL 109 111 113* 113* 111  CO2 23 21* 20* 16* 18*  GLUCOSE 166* 100* 113* 115* 108*  BUN 36* 33* 35* 34* 36*  CREATININE 2.06* 1.99* 2.07* 2.15* 2.29*  CALCIUM 8.5* 8.3* 8.0* 8.6* 8.7*   GFR: Estimated Creatinine Clearance: 17.8 mL/min (A) (by C-G formula based on SCr of 2.29 mg/dL (H)). Liver Function Tests: Recent Labs  Lab 11/24/17 2033 11/25/17 0610 11/25/17 1112  AST 47* 45* 40  ALT 42 45* 42  ALKPHOS 81 71 64  BILITOT 0.3 0.7 0.7  PROT 5.6* 5.6* 5.4*  ALBUMIN 2.1* 2.1* 2.0*   Recent Labs  Lab 11/24/17 2033  LIPASE 20   No results for input(s): AMMONIA in the last 168 hours. Coagulation Profile: No results for input(s): INR, PROTIME in the last 168 hours. Cardiac Enzymes: No results for input(s): CKTOTAL, CKMB, CKMBINDEX, TROPONINI in the last 168 hours. BNP (last 3 results) No results for input(s): PROBNP in the last 8760 hours. HbA1C: No results for input(s): HGBA1C in the last 72 hours. CBG: No results for input(s): GLUCAP in the last 168 hours. Lipid Profile: No results for input(s): CHOL, HDL, LDLCALC, TRIG, CHOLHDL, LDLDIRECT in the last 72 hours. Thyroid Function Tests: No results for input(s): TSH, T4TOTAL, FREET4, T3FREE, THYROIDAB in the last 72 hours. Anemia Panel: No results for input(s): VITAMINB12, FOLATE, FERRITIN, TIBC, IRON, RETICCTPCT in the last 72 hours. Sepsis Labs: Recent Labs  Lab 11/24/17 2041 11/24/17 2324 11/25/17 1519 11/25/17 1822  LATICACIDVEN 2.44* 0.89 1.5 1.5     Recent Results (from the past 240 hour(s))  Blood culture (routine x 2)     Status: None (Preliminary result)   Collection Time: 11/24/17  8:33 PM  Result Value Ref Range Status   Specimen Description   Final    BLOOD RIGHT WRIST Performed at [facility] [address], [place], [place] [address]    Special Requests   Final    BOTTLES DRAWN AEROBIC AND ANAEROBIC Blood Culture adequate volume Performed at [facility] [address] [place], [place] [address]    Culture   Final    NO GROWTH 3 DAYS Performed at [facility], [place] [address] [place], [place] [address]    Report Status PENDING  Incomplete  Blood culture (routine x 2)     Status: None (Preliminary result)   Collection Time: 11/24/17  8:33 PM  Result Value Ref Range Status   Specimen Description   Final    BLOOD RIGHT ANTECUBITAL Performed at [facility],  [place] [address], [place], [place] [address]    Special Requests   Final    BOTTLES DRAWN AEROBIC AND ANAEROBIC Blood Culture adequate volume Performed at [facility] [address] [place], [place] [address]    Culture   Final    NO GROWTH 2 DAYS Performed at [facility] [address] [place], [place] [address]    Report Status PENDING  Incomplete  Urine culture     Status: Abnormal   Collection Time: 11/24/17 11:36 PM  Result Value Ref Range Status   Specimen Description   Final    URINE, CLEAN CATCH Performed at [facility], [place] [address] [place], [place] [address]    Special Requests   Final    NONE Performed at [facility], [place] [address], [place], [place] [address]    Culture (A)  Final    >=100,000 COLONIES/mL LACTOBACILLUS SPECIES Standardized susceptibility testing for this organism is not available. Performed at [facility], [place] [address], [place], [place] [address]    Report Status 11/26/2017  FINAL  Final  MRSA PCR Screening     Status: None   Collection Time: 11/25/17  6:18 PM  Result Value Ref Range Status   MRSA by PCR NEGATIVE NEGATIVE Final    Comment:        The GeneXpert MRSA Assay (FDA approved for NASAL specimens only), is one component of a comprehensive MRSA colonization surveillance program. It is not intended to diagnose MRSA infection nor to guide or monitor treatment for MRSA infections. Performed at [facility], [place] [address] [place], [place] [address]   C difficile quick scan w PCR reflex     Status: None   Collection Time: 11/26/17  4:06 AM  Result Value Ref Range Status   C Diff antigen NEGATIVE NEGATIVE Final   C Diff toxin NEGATIVE NEGATIVE Final   C Diff interpretation No C. difficile detected.  Final    Comment: Performed at [facility], [place] [address], [place], [place] [address]  Gastrointestinal Panel by PCR , Stool     Status: Abnormal   Collection Time: 11/26/17  4:06 AM  Result Value Ref Range Status   Campylobacter species NOT DETECTED NOT DETECTED Final   Plesimonas shigelloides NOT DETECTED NOT DETECTED Final   Salmonella species NOT DETECTED NOT DETECTED Final   Yersinia enterocolitica NOT DETECTED NOT DETECTED Final   Vibrio species NOT DETECTED NOT DETECTED Final   Vibrio cholerae NOT DETECTED NOT DETECTED Final   Enteroaggregative E coli (EAEC) NOT DETECTED NOT DETECTED Final   Enteropathogenic E coli (EPEC) NOT DETECTED NOT DETECTED Final   Enterotoxigenic E coli (ETEC) DETECTED (A) NOT DETECTED Final    Comment: RESULT CALLED TO, READ BACK BY AND VERIFIED WITH: SARAH BETH EARLY @ 1836 ON 11/26/2017 BY CAF    Shiga like toxin producing E coli (STEC) NOT DETECTED NOT DETECTED Final   Shigella/Enteroinvasive E coli (EIEC) NOT DETECTED NOT DETECTED Final   Cryptosporidium NOT DETECTED NOT DETECTED Final   Cyclospora cayetanensis NOT DETECTED NOT DETECTED Final   Entamoeba histolytica NOT  DETECTED NOT DETECTED Final   Giardia lamblia NOT DETECTED NOT DETECTED Final   Adenovirus F40/41 NOT DETECTED NOT DETECTED Final   Astrovirus NOT DETECTED NOT DETECTED Final   Norovirus GI/GII NOT DETECTED NOT DETECTED Final   Rotavirus A NOT DETECTED NOT DETECTED Final   Sapovirus (I, II, IV, and  V) NOT DETECTED NOT DETECTED Final    Comment: Performed at [facility], [address], [place], [place] [address]         Radiology Studies: Dg Chest 1 View  Result Date: 11/26/2017 CLINICAL DATA:  Evaluate hypoxia. EXAM: CHEST  1 VIEW COMPARISON:  11/25/2017. FINDINGS: Worsening aeration throughout the RIGHT hemithorax, with increasing upper lobe consolidation as the predominant abnormality. RIGHT lower lobe opacity is probably stable. There is a moderate RIGHT pleural effusion which appears increased. LEFT lung changes appear stable. Normal heart size. Calcified tortuous aorta. Osteopenia. IMPRESSION: Slight worsening aeration. Electronically Signed   By: Staci Righter M.D.   On: 11/26/2017 16:25   Dg Chest 1 View  Result Date: 11/25/2017 CLINICAL DATA:  77 year old female with a history of wheezing and hypertension EXAM: CHEST  1 VIEW COMPARISON:  11/24/2017, chest CT 11/15/2017 FINDINGS: Cardiomediastinal silhouette likely unchanged with the borders partially obscured by overlying lung and pleural disease. Worsening opacity at the right lung base with mixed interstitial and airspace opacity obscuring the right hemidiaphragm. Blunting of the right costophrenic angle. Worsening opacity at the left lung base with partial obscuration left hemidiaphragm and left heart border. Similar appearance of opacity at the right upper lobe, with pleuroparenchymal thickening, extending to the hilum. Calcifications of the aortic arch. IMPRESSION: Worsening opacities of the right greater than left lung base, potentially combination of worsening edema, pleural fluid, atelectasis/consolidation. Aspiration  may be considered. Redemonstration of opacity of the right upper lobe extending from the hilum to the apex in this patient with known cancer. Electronically Signed   By: Corrie Mckusick D.O.   On: 11/25/2017 17:53        Scheduled Meds: . sodium chloride   Intravenous Once  . albuterol  2.5 mg Nebulization Q6H  . atorvastatin  40 mg Oral Daily  . clopidogrel  75 mg Oral Q breakfast  . furosemide  10 mg Intravenous Once   Continuous Infusions: . ceFEPime (MAXIPIME) IV Stopped (11/27/17 0525)  . vancomycin Stopped (11/27/17 0730)     LOS: 2 days    Georgette Shell, MD [facility]  If 7PM-7AM, please contact night-coverage [email or web address] Password [email or web address] 11/27/2017, 10:07 AM

## 2017-11-27 NOTE — Evaluation (Signed)
Physical Therapy Evaluation Patient Details Name: [name] MRN: [id number] DOB: 1940-07-05 Today's Date: 11/27/2017   History of Present Illness  77 y.o. female with recently diagnosed non-small cell lung cancer had chemotherapy last week he was complaining of abdominal pain burning sensation with diarrhea.  Admitted with sepsis likely from UTI and possible pneumonia  Clinical Impression  Patient presents with decreased mobility and safety due to weakness, decreased acitviity tolerance, decreased balance, decreased strength and she will benefit from skilled PT in the acute setting to allow d/c home with family support following SNF level rehab stay.    Follow Up Recommendations SNF;Supervision/Assistance - 24 hour    Equipment Recommendations  Other (comment)(TBA)    Recommendations for Other Services       Precautions / Restrictions Precautions Precautions: Fall      Mobility  Bed Mobility Overal bed mobility: Needs Assistance Bed Mobility: Supine to Sit;Sit to Supine;Rolling Rolling: Min assist   Supine to sit: HOB elevated;Mod assist Sit to supine: Max assist   General bed mobility comments: assist for lifting trunk and guiding legs off bed, to supine assist for trunk and lifting legs  Transfers Overall transfer level: Needs assistance Equipment used: Rolling walker (2 wheeled) Transfers: Sit to/from Stand Sit to Stand: Max assist         General transfer comment: lifting and lowering assist  Ambulation/Gait Ambulation/Gait assistance: Mod assist Gait Distance (Feet): 2 Feet Assistive device: Rolling walker (2 wheeled) Gait Pattern/deviations: Step-to pattern;Decreased stride length     General Gait Details: assist for lateral weight shift with RW for side steps up to [facility]; pt leaning posteriorly and one episode of knees buckling  Stairs            Wheelchair Mobility    Modified Rankin (Stroke Patients Only)       Balance Overall  balance assessment: Needs assistance Sitting-balance support: Feet unsupported;Feet supported Sitting balance-Leahy Scale: Fair Sitting balance - Comments: sitting EOB without assist   Standing balance support: Bilateral upper extremity supported Standing balance-Leahy Scale: Poor Standing balance comment: leaning back against bed with legs                             Pertinent Vitals/Pain Pain Assessment: No/denies pain    Home Living Family/patient expects to be discharged to:: Private residence Living Arrangements: Alone Available Help at Discharge: Family;Personal care attendant;Available PRN/intermittently(old notes about pt having an aide M-F in am) Type of Home: Apartment Home Access: Level entry     Home Layout: One level Home Equipment: Walker - 4 wheels      Prior Function Level of Independence: Needs assistance   Gait / Transfers Assistance Needed: Mod indep amb household distances with rollator. Relies on [employer] for transportation; son does grocery shopping  ADL's / Homemaking Assistance Needed: [employer] aide M-F for 3 hrs/day, assist with all bathing, cooking, and cleaning.   Comments: pt states will go to her son's house temporarily after d/c.      Hand Dominance        Extremity/Trunk Assessment   Upper Extremity Assessment Upper Extremity Assessment: Generalized weakness    Lower Extremity Assessment Lower Extremity Assessment: Generalized weakness       Communication   Communication: HOH  Cognition Arousal/Alertness: Awake/alert Behavior During Therapy: WFL for tasks assessed/performed Overall Cognitive Status: No family/caregiver present to determine baseline cognitive functioning  General Comments: disoriented to time, increased time to follow simple one step commands      General Comments General comments (skin integrity, edema, etc.): not on O2 and vitals stable except for run of HR in  120's when lying down at first, but back to 100's after resting; did have gasping symptoms at times    Exercises     Assessment/Plan    PT Assessment Patient needs continued PT services  PT Problem List Decreased strength;Decreased balance;Decreased cognition;Decreased knowledge of precautions;Decreased mobility;Decreased knowledge of use of DME;Decreased safety awareness;Decreased activity tolerance       PT Treatment Interventions DME instruction;Functional mobility training;Balance training;Patient/family education;Gait training;Therapeutic activities;Neuromuscular re-education;Therapeutic exercise    PT Goals (Current goals can be found in the Care Plan section)  Acute Rehab PT Goals Patient Stated Goal: to go home with son A PT Goal Formulation: With patient Time For Goal Achievement: 12/05/17 Potential to Achieve Goals: Fair    Frequency Min 2X/week   Barriers to discharge        Co-evaluation               AM-PAC PT "6 Clicks" Daily Activity  Outcome Measure Difficulty turning over in bed (including adjusting bedclothes, sheets and blankets)?: Unable Difficulty moving from lying on back to sitting on the side of the bed? : Unable Difficulty sitting down on and standing up from a chair with arms (e.g., wheelchair, bedside commode, etc,.)?: Unable Help needed moving to and from a bed to chair (including a wheelchair)?: Total Help needed walking in hospital room?: Total Help needed climbing 3-5 steps with a railing? : Total 6 Click Score: 6    End of Session Equipment Utilized During Treatment: Gait belt Activity Tolerance: Patient limited by fatigue Patient left: in bed;with call bell/phone within reach;with bed alarm set Nurse Communication: Other (comment);Mobility status(pure wich) PT Visit Diagnosis: Muscle weakness (generalized) (M62.81);Other abnormalities of gait and mobility (R26.89);Difficulty in walking, not elsewhere classified (R26.2);Other symptoms  and signs involving the nervous system (R29.898)    Time: 0932-3557 PT Time Calculation (min) (ACUTE ONLY): 28 min   Charges:   PT Evaluation $PT Eval Moderate Complexity: 1 Mod PT Treatments $Therapeutic Activity: 8-22 mins   PT G CodesMagda Kiel, [place] [phone] 11/27/2017   Reginia Naas 11/27/2017, 4:35 PM

## 2017-11-27 NOTE — Progress Notes (Signed)
  Echocardiogram 2D Echocardiogram has been performed.  Darlina Sicilian M 11/27/2017, 11:53 AM

## 2017-11-28 ENCOUNTER — Ambulatory Visit (HOSPITAL_COMMUNITY): Admission: RE | Admit: 2017-11-28 | Payer: Medicare Other | Source: Ambulatory Visit

## 2017-11-28 ENCOUNTER — Inpatient Hospital Stay (HOSPITAL_COMMUNITY): Payer: Medicare Other

## 2017-11-28 LAB — BASIC METABOLIC PANEL
Anion gap: 9 (ref 5–15)
BUN: 38 mg/dL — AB (ref 8–23)
CHLORIDE: 111 mmol/L (ref 98–111)
CO2: 21 mmol/L — AB (ref 22–32)
Calcium: 8.9 mg/dL (ref 8.9–10.3)
Creatinine, Ser: 2.41 mg/dL — ABNORMAL HIGH (ref 0.44–1.00)
GFR calc Af Amer: 21 mL/min — ABNORMAL LOW (ref >60)
GFR calc non Af Amer: 18 mL/min — ABNORMAL LOW (ref >60)
GLUCOSE: 110 mg/dL — AB (ref 70–99)
POTASSIUM: 4.5 mmol/L (ref 3.5–5.1)
Sodium: 141 mmol/L (ref 135–145)

## 2017-11-28 MED ORDER — METOPROLOL TARTRATE 5 MG/5ML IV SOLN
5.0000 mg | Freq: Once | INTRAVENOUS | Status: AC
  Administered 2017-11-28: 5 mg via INTRAVENOUS
  Filled 2017-11-28: qty 5

## 2017-11-28 NOTE — Progress Notes (Signed)
PROGRESS NOTE    [name]  HYW:[id number] DOB: 1940-05-29 DOA: 11/24/2017 PCP: Sid Falcon, MD   Brief Narrative:76 y.o.femalewithrecently diagnosed non-small cell lung cancer had chemotherapy last week he was complaining of abdominal pain burning sensation with diarrhea. Patient has had recent multiple ED visits complaining of chronic pain and chest pain. She had some fever prior to arrival. Pain in the abdomen is generalized with some loose bowel movement. Patient is legally blind and difficulty here.  ED Course:In the ER patient was hypotensive febrile tachycardic with elevated lactate concerning for sepsis. Patient was given fluid bolus and started on empiric antibiotics. UA shows features concerning for UTI. Chest x-ray shows possible infiltrates.CT abdomen was unremarkable. On exam patient is not in distress and abdomen appears benign on exam. Will admit for sepsis likely from UTIand possible pneumonia.    Assessment & Plan:   Principal Problem:   Sepsis ([facility]) Active Problems:   ARF (acute renal failure) (HCC)   Acute lower UTI   Diarrhea   Pancytopenia (HCC)   Hypoxia ]Sepsis secondary to pneumonia appears to be resolving.  Patient afebrile normal white count,bp stabilized.Urine culture lactobacillus probably contaminant.Stool studies show Entero toxigenic E coli .Since she is not neutropenic and have no diarrhea,will hold off on treatment.continue Cefepime.Chest Xray 11/27/2017 after IV hydration- RUL infiltrate.  2]AKI-ATN vs Contrast vs interstitial nephritis vs chemo related.worsening renal function since the beginning of this month inspite of iv hydration.appreciate renal input.  3]Pancytopenia s/p chemo.  4]lung ca s/p recent chemo.dr Julien Nordmann  5] anemia of chronic disease.  Status post 1 unit of blood transfusion.            DVT prophylaxis:scd Code Status:partial Family Communication: none left several messages for  son Disposition Plan: tbd Consultants:  renal  Procedures:none Antimicrobials:cefepime  Subjective:very hard of hearing,when I was listenign to her chest she tells me dont hurt me.in nad.   Objective: Vitals:   11/28/17 0706 11/28/17 0800 11/28/17 0810 11/28/17 0900  BP:  (!) 157/70  (!) 141/61  Pulse:   (!) 116   Resp:  (!) 27 20 (!) 24  Temp: 100.2 F (37.9 C)     TempSrc: Axillary     SpO2:  93% 94% 93%  Weight:      Height:        Intake/Output Summary (Last 24 hours) at 11/28/2017 1209 Last data filed at 11/28/2017 0900 Gross per 24 hour  Intake -  Output 1600 ml  Net -1600 ml   Filed Weights   11/24/17 1955 11/25/17 1815  Weight: 57.6 kg (127 lb) 60.5 kg (133 lb 6.1 oz)    Examination:  General exam: Appears calm and comfortable  Respiratory system: Coarse bs auscultation. Respiratory effort normal. Cardiovascular system: S1 & S2 heard, RRR. No JVD, murmurs, rubs, gallops or clicks. No pedal edema. Gastrointestinal system: Abdomen is nondistended, soft and nontender. No organomegaly or masses felt. Normal bowel sounds heard. Central nervous system: Alert and oriented. No focal neurological deficits. Extremities: Symmetric 5 x 5 power. Skin: No rashes, lesions or ulcers Psychiatry: Judgement and insight appear normal. Mood & affect appropriate.     Data Reviewed: I have personally reviewed following labs and imaging studies  CBC: Recent Labs  Lab 11/24/17 2033 11/25/17 0610 11/25/17 1112 11/26/17 0726  WBC 2.7* 4.3 4.0  --   NEUTROABS 1.6* 3.1  --   --   HGB 9.8* 8.3* 7.6* 9.2*  HCT 31.6* 26.8* 24.9* 29.3*  MCV 91.3  92.7 94.0  --   PLT 119* 136* 134*  --    Basic Metabolic Panel: Recent Labs  Lab 11/25/17 0610 11/25/17 1112 11/26/17 0749 11/27/17 0730 11/28/17 0325  NA 143 141 138 138 141  K 4.4 4.7 4.4 4.4 4.5  CL 111 113* 113* 111 111  CO2 21* 20* 16* 18* 21*  GLUCOSE 100* 113* 115* 108* 110*  BUN 33* 35* 34* 36* 38*  CREATININE  1.99* 2.07* 2.15* 2.29* 2.41*  CALCIUM 8.3* 8.0* 8.6* 8.7* 8.9   GFR: Estimated Creatinine Clearance: 16.9 mL/min (A) (by C-G formula based on SCr of 2.41 mg/dL (H)). Liver Function Tests: Recent Labs  Lab 11/24/17 2033 11/25/17 0610 11/25/17 1112  AST 47* 45* 40  ALT 42 45* 42  ALKPHOS 81 71 64  BILITOT 0.3 0.7 0.7  PROT 5.6* 5.6* 5.4*  ALBUMIN 2.1* 2.1* 2.0*   Recent Labs  Lab 11/24/17 2033  LIPASE 20   No results for input(s): AMMONIA in the last 168 hours. Coagulation Profile: No results for input(s): INR, PROTIME in the last 168 hours. Cardiac Enzymes: No results for input(s): CKTOTAL, CKMB, CKMBINDEX, TROPONINI in the last 168 hours. BNP (last 3 results) No results for input(s): PROBNP in the last 8760 hours. HbA1C: No results for input(s): HGBA1C in the last 72 hours. CBG: No results for input(s): GLUCAP in the last 168 hours. Lipid Profile: No results for input(s): CHOL, HDL, LDLCALC, TRIG, CHOLHDL, LDLDIRECT in the last 72 hours. Thyroid Function Tests: No results for input(s): TSH, T4TOTAL, FREET4, T3FREE, THYROIDAB in the last 72 hours. Anemia Panel: No results for input(s): VITAMINB12, FOLATE, FERRITIN, TIBC, IRON, RETICCTPCT in the last 72 hours. Sepsis Labs: Recent Labs  Lab 11/24/17 2041 11/24/17 2324 11/25/17 1519 11/25/17 1822  LATICACIDVEN 2.44* 0.89 1.5 1.5    Recent Results (from the past 240 hour(s))  Blood culture (routine x 2)     Status: None (Preliminary result)   Collection Time: 11/24/17  8:33 PM  Result Value Ref Range Status   Specimen Description   Final    BLOOD RIGHT WRIST Performed at [facility] [address] [place], [place] [address]    Special Requests   Final    BOTTLES DRAWN AEROBIC AND ANAEROBIC Blood Culture adequate volume Performed at [facility] [address], [place], [place] [address]    Culture   Final    NO GROWTH 3 DAYS Performed at [facility],  [place] [address] [place], [place] [address]    Report Status PENDING  Incomplete  Blood culture (routine x 2)     Status: None (Preliminary result)   Collection Time: 11/24/17  8:33 PM  Result Value Ref Range Status   Specimen Description   Final    BLOOD RIGHT ANTECUBITAL Performed at [facility] [address] [place], [place] [address]    Special Requests   Final    BOTTLES DRAWN AEROBIC AND ANAEROBIC Blood Culture adequate volume Performed at [facility] [address], [place], [place] [address]    Culture   Final    NO GROWTH 2 DAYS Performed at [facility] [address] [place], [place] [address]    Report Status PENDING  Incomplete  Urine culture     Status: Abnormal   Collection Time: 11/24/17 11:36 PM  Result Value Ref Range Status   Specimen Description   Final    URINE, CLEAN CATCH Performed  at [facility], [place] [address], [place], [place] [address]    Special Requests   Final    NONE Performed at [facility], [place] [address] [place], [place] [address]    Culture (A)  Final    >=100,000 COLONIES/mL LACTOBACILLUS SPECIES Standardized susceptibility testing for this organism is not available. Performed at [facility], [place] [address] [place], [place] [address]    Report Status 11/26/2017 FINAL  Final  MRSA PCR Screening     Status: None   Collection Time: 11/25/17  6:18 PM  Result Value Ref Range Status   MRSA by PCR NEGATIVE NEGATIVE Final    Comment:        The GeneXpert MRSA Assay (FDA approved for NASAL specimens only), is one component of a comprehensive MRSA colonization surveillance program. It is not intended to diagnose MRSA infection nor to guide or monitor treatment for MRSA infections. Performed at [facility], [place] [address], [place], [place] [address]   C difficile quick scan w PCR reflex     Status: None   Collection Time:  11/26/17  4:06 AM  Result Value Ref Range Status   C Diff antigen NEGATIVE NEGATIVE Final   C Diff toxin NEGATIVE NEGATIVE Final   C Diff interpretation No C. difficile detected.  Final    Comment: Performed at [facility], [place] [address] [place], [place] [address]  Gastrointestinal Panel by PCR , Stool     Status: Abnormal   Collection Time: 11/26/17  4:06 AM  Result Value Ref Range Status   Campylobacter species NOT DETECTED NOT DETECTED Final   Plesimonas shigelloides NOT DETECTED NOT DETECTED Final   Salmonella species NOT DETECTED NOT DETECTED Final   Yersinia enterocolitica NOT DETECTED NOT DETECTED Final   Vibrio species NOT DETECTED NOT DETECTED Final   Vibrio cholerae NOT DETECTED NOT DETECTED Final   Enteroaggregative E coli (EAEC) NOT DETECTED NOT DETECTED Final   Enteropathogenic E coli (EPEC) NOT DETECTED NOT DETECTED Final   Enterotoxigenic E coli (ETEC) DETECTED (A) NOT DETECTED Final    Comment: RESULT CALLED TO, READ BACK BY AND VERIFIED WITH: SARAH BETH EARLY @ 1836 ON 11/26/2017 BY CAF    Shiga like toxin producing E coli (STEC) NOT DETECTED NOT DETECTED Final   Shigella/Enteroinvasive E coli (EIEC) NOT DETECTED NOT DETECTED Final   Cryptosporidium NOT DETECTED NOT DETECTED Final   Cyclospora cayetanensis NOT DETECTED NOT DETECTED Final   Entamoeba histolytica NOT DETECTED NOT DETECTED Final   Giardia lamblia NOT DETECTED NOT DETECTED Final   Adenovirus F40/41 NOT DETECTED NOT DETECTED Final   Astrovirus NOT DETECTED NOT DETECTED Final   Norovirus GI/GII NOT DETECTED NOT DETECTED Final   Rotavirus A NOT DETECTED NOT DETECTED Final   Sapovirus (I, II, IV, and V) NOT DETECTED NOT DETECTED Final    Comment: Performed at [facility], [address] [place],  [address]         Radiology Studies: Dg Chest 1 View  Result Date: 11/27/2017 CLINICAL DATA:  Cough.  Follow-up pneumonia. EXAM: CHEST  1 VIEW COMPARISON:   11/26/2017. FINDINGS: Right upper lobe persistent infiltrate with associated right pleural thickening noted. Bibasilar atelectasis and/or infiltrates. Similar findings noted on prior exam. Chest is unchanged. Heart size normal. Thoracic spine scoliosis and degenerative change. IMPRESSION: Chest is unchanged with persistent right upper lobe infiltrate with associated right apical pleural thickening. Persistent bibasilar atelectasis and infiltrates also  noted. Again no interim change from prior exam. Electronically Signed   By: Marcello Moores  Register   On: 11/27/2017 12:04   Dg Chest 1 View  Result Date: 11/26/2017 CLINICAL DATA:  Evaluate hypoxia. EXAM: CHEST  1 VIEW COMPARISON:  11/25/2017. FINDINGS: Worsening aeration throughout the RIGHT hemithorax, with increasing upper lobe consolidation as the predominant abnormality. RIGHT lower lobe opacity is probably stable. There is a moderate RIGHT pleural effusion which appears increased. LEFT lung changes appear stable. Normal heart size. Calcified tortuous aorta. Osteopenia. IMPRESSION: Slight worsening aeration. Electronically Signed   By: Staci Righter M.D.   On: 11/26/2017 16:25        Scheduled Meds: . sodium chloride   Intravenous Once  . albuterol  2.5 mg Nebulization Q6H  . atorvastatin  40 mg Oral Daily  . clopidogrel  75 mg Oral Q breakfast   Continuous Infusions: . ceFEPime (MAXIPIME) IV Stopped (11/28/17 0552)     LOS: 3 days     Georgette Shell, MD [facility]  If 7PM-7AM, please contact night-coverage [email or web address] Password [facility] 11/28/2017, 12:09 PM

## 2017-11-28 NOTE — Progress Notes (Signed)
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  Subjective:  . Decent UOP . SCr up slightly .  K and HCO3 ok . Pt not interactive this AM, does respond during exam  07/19 0701 - 07/20 0700 In: 150 [IV Piggyback:150] Out: 1200 [Urine:1200]  Filed Weights   11/24/17 1955 11/25/17 1815  Weight: 57.6 kg (127 lb) 60.5 kg (133 lb 6.1 oz)    Scheduled Meds: . sodium chloride   Intravenous Once  . albuterol  2.5 mg Nebulization Q6H  . atorvastatin  40 mg Oral Daily  . clopidogrel  75 mg Oral Q breakfast   Continuous Infusions: . ceFEPime (MAXIPIME) IV 1 g (11/28/17 0522)   PRN Meds:.acetaminophen **OR** acetaminophen, albuterol, alum & mag hydroxide-simeth, ondansetron **OR** ondansetron (ZOFRAN) IV, traMADol  Current Labs: reviewed    Physical Exam:  Blood pressure (!) 130/47, pulse (!) 101, temperature 100.2 F (37.9 C), temperature source Axillary, resp. rate (!) 23, height 5\' 4"  (1.626 m), weight 60.5 kg (133 lb 6.1 oz), SpO2 94 %. GEN: NAD, elderly, chronically ill appearing ENT: poor dentition, NCAT EYES: Blind CV: tachy, regular,n l s1s2 PULM: CTAB ABD: s/nt/nd SKIN: no rashes/lesions EXT:No LEE  A 1. 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 ETEC diarrheal illness 4. IV contrast exposure 7/7 and 7/11 5. COPD 6. PVD 7. HTN, BP stable, not on meds  P . Cont supportive care, will follow; stable voluem status, acid/base, K . Hold on further diuretics at this time . Medication Issues; o Preferred narcotic agents for pain control are hydromorphone, fentanyl, and methadone. Morphine should not be used.  o Baclofen should be avoided o Avoid oral sodium phosphate and magnesium citrate based laxatives / bowel preps    Pearson Grippe MD 11/28/2017, 7:55  AM  Recent Labs  Lab 11/26/17 0749 11/27/17 0730 11/28/17 0325  NA 138 138 141  K 4.4 4.4 4.5  CL 113* 111 111  CO2 16* 18* 21*  GLUCOSE 115* 108* 110*  BUN 34* 36* 38*  CREATININE 2.15* 2.29* 2.41*  CALCIUM 8.6* 8.7* 8.9   Recent Labs  Lab 11/24/17 2033 11/25/17 0610 11/25/17 1112 11/26/17 0726  WBC 2.7* 4.3 4.0  --   NEUTROABS 1.6* 3.1  --   --   HGB 9.8* 8.3* 7.6* 9.2*  HCT 31.6* 26.8* 24.9* 29.3*  MCV 91.3 92.7 94.0  --   PLT 119* 136* 134*  --

## 2017-11-28 NOTE — Progress Notes (Signed)
Pharmacy Antibiotic Note  [name] is a 77 y.o. female admitted on 11/24/2017 with a past medical history of lung cancer diagnosed June 2019,CKD, COPD, hypertension, who presents to ED for evaluation of abdominal pain, diarrhea and fever..  Pharmacy has been consulted for vanc and cefepime for sepsis.  Today, 11/28/2017 Day #4 Cefepime SCr 2.41, increased CrCl ~16 ml/min WBC 4.0 (7/17) Tmax 100.2  Plan: Continue Cefepime 1gm IV q24h Follow renal function, cultures and clinical course  Height: 5\' 4"  (162.6 cm) Weight: 133 lb 6.1 oz (60.5 kg) IBW/kg (Calculated) : 54.7  Temp (24hrs), Avg:99.5 F (37.5 C), Min:98.8 F (37.1 C), Max:100.2 F (37.9 C)  Recent Labs  Lab 11/24/17 2033 11/24/17 2041 11/24/17 2324 11/25/17 0610 11/25/17 1112 11/25/17 1519 11/25/17 1822 11/26/17 0749 11/27/17 0730 11/28/17 0325  WBC 2.7*  --   --  4.3 4.0  --   --   --   --   --   CREATININE 2.06*  --   --  1.99* 2.07*  --   --  2.15* 2.29* 2.41*  LATICACIDVEN  --  2.44* 0.89  --   --  1.5 1.5  --   --   --     Estimated Creatinine Clearance: 16.9 mL/min (A) (by C-G formula based on SCr of 2.41 mg/dL (H)).    Allergies  Allergen Reactions  . Codeine Other (See Comments)    REACTION: Pt c/o "feeling high"  . Morphine Other (See Comments)    REACTION: feels funny and "high"    Antimicrobials this admission:  7/17 vancomycin >> 7/19 7/18 cefepime >>   Dose adjustments this admission:   Microbiology results:  7/16 BCx: ngtd 7/16 Legionella antigen: neg 7/16 UCx: >100k Lactobacillus sp. 7/17 MRSA PCR: negative 7/18 C. Diff panel: negative 7/18 GI panel: Enterotoxigenic E.coli detected  Thank you for allowing pharmacy to be a part of this patient's care.  Peggyann Juba, PharmD, BCPS Pager: [phone] 11/28/2017, 10:42 AM

## 2017-11-29 DIAGNOSIS — J181 Lobar pneumonia, unspecified organism: Secondary | ICD-10-CM

## 2017-11-29 DIAGNOSIS — R062 Wheezing: Secondary | ICD-10-CM

## 2017-11-29 LAB — BASIC METABOLIC PANEL
Anion gap: 10 (ref 5–15)
BUN: 43 mg/dL — AB (ref 8–23)
CALCIUM: 8.8 mg/dL — AB (ref 8.9–10.3)
CO2: 20 mmol/L — ABNORMAL LOW (ref 22–32)
CREATININE: 2.39 mg/dL — AB (ref 0.44–1.00)
Chloride: 113 mmol/L — ABNORMAL HIGH (ref 98–111)
GFR, EST AFRICAN AMERICAN: 21 mL/min — AB (ref >60)
GFR, EST NON AFRICAN AMERICAN: 18 mL/min — AB (ref >60)
Glucose, Bld: 70 mg/dL (ref 70–99)
Potassium: 3.6 mmol/L (ref 3.5–5.1)
SODIUM: 143 mmol/L (ref 135–145)

## 2017-11-29 LAB — CULTURE, BLOOD (ROUTINE X 2)
Culture: NO GROWTH
SPECIAL REQUESTS: ADEQUATE

## 2017-11-29 MED ORDER — TRAMADOL HCL 50 MG PO TABS: 50.0000 mg | ORAL_TABLET | Freq: Two times a day (BID) | ORAL | Status: DC | PRN

## 2017-11-29 MED ORDER — IPRATROPIUM-ALBUTEROL 0.5-2.5 (3) MG/3ML IN SOLN
3.0000 mL | Freq: Three times a day (TID) | RESPIRATORY_TRACT | Status: DC
  Administered 2017-11-29 – 2017-12-02 (×8): 3 mL via RESPIRATORY_TRACT
  Filled 2017-11-29 (×8): qty 3

## 2017-11-29 MED ORDER — SODIUM CHLORIDE 0.9 % IV SOLN
INTRAVENOUS | Status: DC
  Administered 2017-11-29 – 2017-12-01 (×4): via INTRAVENOUS

## 2017-11-29 NOTE — Progress Notes (Signed)
[name] is pt daughter in [employer]. Her phone number is  [phone]. This is the number that needs to be contacted if trying to reach family (son [name]).  Pt with no urine output for 3 hours. Pt bladder scanned and only revealed 25cc. Rodena Piety, MD made aware. Orders for NS at 75 mL/hr ordered. Will continue to monitor.

## 2017-11-29 NOTE — Progress Notes (Addendum)
PROGRESS NOTE    [name]  IZT:[id number] DOB: 03-Feb-1941 DOA: 11/24/2017 PCP: Sid Falcon, MD  Brief Narrative:76 y.o.femalewithrecently diagnosed non-small cell lung cancer had chemotherapy last week he was complaining of abdominal pain burning sensation with diarrhea. Patient has had recent multiple ED visits complaining of chronic pain and chest pain. She had some fever prior to arrival. Pain in the abdomen is generalized with some loose bowel movement. Patient is legally blind and difficulty here.  ED Course:In the ER patient was hypotensive febrile tachycardic with elevated lactate concerning for sepsis. Patient was given fluid bolus and started on empiric antibiotics. UA shows features concerning for UTI. Chest x-ray shows possible infiltrates.CT abdomen was unremarkable. On exam patient is not in distress and abdomen appears benign on exam. Will admit for sepsis likely from UTIand possible pneumonia.     Assessment & Plan:   Principal Problem:   Sepsis ([place]) Active Problems:   ARF (acute renal failure) ([place])   Acute lower UTI   Diarrhea   Pancytopenia ([place])   Hypoxia  1] healthcare associated pneumonia in a patient with history of lung CA status post recent chemotherapy.  Patient was treated with vancomycin and cefepime.  Vanco has been stopped since MRSA PCR came back negative and with acute on chronic kidney disease.  Pneumonia seems to be resolving, chest x-ray findings consistent with improvement.  She is 93% on room air white count normal.  2] enterotoxigenic E. coli in the stool discussed with infectious disease over the phone plan was not to treat-since she is not neutropenic and had no further diarrhea.  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  This was an incidental finding.  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  MRI  was supposed to be done as an outpatient for metastatic involvement.  Since she was in the hospital the MRI was done here.  She has no signs of dysphagia or dysarthria or localized weakness.  She did speak to when she wants to.  Discussed with neurology on-call no further management was recommended at this time.  Patient had an echocardiogram ejection fraction 60 to 65%.  Small echodensity attached to the tricuspid valve measuring 7 x 4 mm if clinically indicated consider TEE for further evaluation.  4] AKI with CKD stage III by renal mild improvements noted.  Patient not a hemodialysis candidate anyhow.  5] pancytopenia secondary to chemo  6]  non-small cell lung CA status post recent chemo will discuss with Dr. Julien Nordmann tomorrow  7] chronic disease received 1 unit of blood transfusion    DVT prophylaxis: SCD Code Status: Partial code Family Communication: I have left several messages with patient's son since that time the patient was admitted to the hospital never got a return call back. Disposition Plan:. 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  Objective: Vitals:   11/29/17 0800 11/29/17 0900 11/29/17 1000 11/29/17 1100  BP: (!) 111/42 114/62 (!) 133/52 131/69  Pulse:      Resp: (!) 0 (!) 26 Marland Kitchen)  24 (!) 22  Temp:      TempSrc:      SpO2: 96% 94% 95% 97%  Weight:      Height:        Intake/Output Summary (Last 24 hours) at 11/29/2017 1233 Last data filed at 11/29/2017 0943 Gross per 24 hour  Intake 0 ml  Output 450 ml  Net -450 ml   Filed Weights    11/24/17 1955 11/25/17 1815  Weight: 57.6 kg (127 lb) 60.5 kg (133 lb 6.1 oz)    Examination:  General exam: Appears calm and comfortable  Respiratory system: few rhonchi to auscultation. Respiratory effort normal. Cardiovascular system: S1 & S2 heard, RRR. No JVD, murmurs, rubs, gallops or clicks. No pedal edema. Gastrointestinal system: Abdomen is nondistended, soft and nontender. No organomegaly or masses felt. Normal bowel sounds heard. Extremities: Symmetric 5 x 5 power. Skin: No rashes, lesions or ulcers     Data Reviewed: I have personally reviewed following labs and imaging studies  CBC: Recent Labs  Lab 11/24/17 2033 11/25/17 0610 11/25/17 1112 11/26/17 0726  WBC 2.7* 4.3 4.0  --   NEUTROABS 1.6* 3.1  --   --   HGB 9.8* 8.3* 7.6* 9.2*  HCT 31.6* 26.8* 24.9* 29.3*  MCV 91.3 92.7 94.0  --   PLT 119* 136* 134*  --    Basic Metabolic Panel: Recent Labs  Lab 11/25/17 1112 11/26/17 0749 11/27/17 0730 11/28/17 0325 11/29/17 0315  NA 141 138 138 141 143  K 4.7 4.4 4.4 4.5 3.6  CL 113* 113* 111 111 113*  CO2 20* 16* 18* 21* 20*  GLUCOSE 113* 115* 108* 110* 70  BUN 35* 34* 36* 38* 43*  CREATININE 2.07* 2.15* 2.29* 2.41* 2.39*  CALCIUM 8.0* 8.6* 8.7* 8.9 8.8*   GFR: Estimated Creatinine Clearance: 17 mL/min (A) (by C-G formula based on SCr of 2.39 mg/dL (H)). Liver Function Tests: Recent Labs  Lab 11/24/17 2033 11/25/17 0610 11/25/17 1112  AST 47* 45* 40  ALT 42 45* 42  ALKPHOS 81 71 64  BILITOT 0.3 0.7 0.7  PROT 5.6* 5.6* 5.4*  ALBUMIN 2.1* 2.1* 2.0*   Recent Labs  Lab 11/24/17 2033  LIPASE 20   No results for input(s): AMMONIA in the last 168 hours. Coagulation Profile: No results for input(s): INR, PROTIME in the last 168 hours. Cardiac Enzymes: No results for input(s): CKTOTAL, CKMB, CKMBINDEX, TROPONINI in the last 168 hours. BNP (last 3 results) No results for input(s): PROBNP in the last 8760 hours. HbA1C: No results for input(s):  HGBA1C in the last 72 hours. CBG: No results for input(s): GLUCAP in the last 168 hours. Lipid Profile: No results for input(s): CHOL, HDL, LDLCALC, TRIG, CHOLHDL, LDLDIRECT in the last 72 hours. Thyroid Function Tests: No results for input(s): TSH, T4TOTAL, FREET4, T3FREE, THYROIDAB in the last 72 hours. Anemia Panel: No results for input(s): VITAMINB12, FOLATE, FERRITIN, TIBC, IRON, RETICCTPCT in the last 72 hours. Sepsis Labs: Recent Labs  Lab 11/24/17 2041 11/24/17 2324 11/25/17 1519 11/25/17 1822  LATICACIDVEN 2.44* 0.89 1.5 1.5    Recent Results (from the past 240 hour(s))  Blood culture (routine x 2)     Status: None (Preliminary result)   Collection Time: 11/24/17  8:33 PM  Result Value Ref Range Status   Specimen Description   Final    BLOOD RIGHT WRIST Performed at [facility] [address] [place], [place] [address]    Special Requests   Final  BOTTLES DRAWN AEROBIC AND ANAEROBIC Blood Culture adequate volume Performed at [facility] [address], [place], [place] [address]    Culture   Final    NO GROWTH 4 DAYS Performed at [facility], [place] [address], [place], [place] [address]    Report Status PENDING  Incomplete  Blood culture (routine x 2)     Status: None (Preliminary result)   Collection Time: 11/24/17  8:33 PM  Result Value Ref Range Status   Specimen Description   Final    BLOOD RIGHT ANTECUBITAL Performed at [facility] [address] [place], [place] [address]    Special Requests   Final    BOTTLES DRAWN AEROBIC AND ANAEROBIC Blood Culture adequate volume Performed at [facility] [address] [place], [place] [address]    Culture   Final    NO GROWTH 3 DAYS Performed at [facility], [place] [address], [place], [place] [address]    Report Status PENDING  Incomplete  Urine culture     Status: Abnormal   Collection Time: 11/24/17 11:36 PM    Result Value Ref Range Status   Specimen Description   Final    URINE, CLEAN CATCH Performed at [facility], [place] [address], [place], [place] [address]    Special Requests   Final    NONE Performed at [facility], [place] [address], [place], [place] [address]    Culture (A)  Final    >=100,000 COLONIES/mL LACTOBACILLUS SPECIES Standardized susceptibility testing for this organism is not available. Performed at [facility], [place] [address], [place], [place] [address]    Report Status 11/26/2017 FINAL  Final  MRSA PCR Screening     Status: None   Collection Time: 11/25/17  6:18 PM  Result Value Ref Range Status   MRSA by PCR NEGATIVE NEGATIVE Final    Comment:        The GeneXpert MRSA Assay (FDA approved for NASAL specimens only), is one component of a comprehensive MRSA colonization surveillance program. It is not intended to diagnose MRSA infection nor to guide or monitor treatment for MRSA infections. Performed at [facility], [place] [address] [place], [place] [address]   C difficile quick scan w PCR reflex     Status: None   Collection Time: 11/26/17  4:06 AM  Result Value Ref Range Status   C Diff antigen NEGATIVE NEGATIVE Final   C Diff toxin NEGATIVE NEGATIVE Final   C Diff interpretation No C. difficile detected.  Final    Comment: Performed at [facility], [place] [address] [place], [place] [address]  Gastrointestinal Panel by PCR , Stool     Status: Abnormal   Collection Time: 11/26/17  4:06 AM  Result Value Ref Range Status   Campylobacter species NOT DETECTED NOT DETECTED Final   Plesimonas shigelloides NOT DETECTED NOT DETECTED Final   Salmonella species NOT DETECTED NOT DETECTED Final   Yersinia enterocolitica NOT DETECTED NOT DETECTED Final   Vibrio species NOT DETECTED NOT DETECTED Final   Vibrio cholerae NOT DETECTED NOT DETECTED Final   Enteroaggregative E coli (EAEC) NOT  DETECTED NOT DETECTED Final   Enteropathogenic E coli (EPEC) NOT DETECTED NOT DETECTED Final   Enterotoxigenic E coli (ETEC) DETECTED (A) NOT DETECTED Final    Comment: RESULT CALLED TO, READ BACK BY AND VERIFIED WITH: SARAH BETH EARLY @ 1836 ON 11/26/2017  BY CAF    Shiga like toxin producing E coli (STEC) NOT DETECTED NOT DETECTED Final   Shigella/Enteroinvasive E coli (EIEC) NOT DETECTED NOT DETECTED Final   Cryptosporidium NOT DETECTED NOT DETECTED Final   Cyclospora cayetanensis NOT DETECTED NOT DETECTED Final   Entamoeba histolytica NOT DETECTED NOT DETECTED Final   Giardia lamblia NOT DETECTED NOT DETECTED Final   Adenovirus F40/41 NOT DETECTED NOT DETECTED Final   Astrovirus NOT DETECTED NOT DETECTED Final   Norovirus GI/GII NOT DETECTED NOT DETECTED Final   Rotavirus A NOT DETECTED NOT DETECTED Final   Sapovirus (I, II, IV, and V) NOT DETECTED NOT DETECTED Final    Comment: Performed at [facility], [address], [place], [place] [address]         Radiology Studies: Mr Brain Wo Contrast  Result Date: 11/28/2017 CLINICAL DATA:  Lung CA staging. COPD. Hypertension. Acute on chronic kidney injury. Confusion. EXAM: MRI HEAD WITHOUT CONTRAST TECHNIQUE: Multiplanar, multiecho pulse sequences of the brain and surrounding structures were obtained without intravenous contrast. COMPARISON:  None. FINDINGS: Brain: Subcentimeter focus of restricted diffusion, corresponding low ADC, RIGHT cerebellar cortex posteriorly, see series 3, image 7, series 7, image 8. No other similar abnormalities. This could represent a small acute infarct, or a hypercellular metastasis. Generalized atrophy. Extensive T2 and FLAIR hyperintensities in the white matter, likely small vessel disease. There is a tiny chronic infarct in the RIGHT inferior cerebellum, more laterally. Vascular: Flow voids are maintained.  No chronic hemorrhage. Skull and upper cervical spine: Subcentimeter T1 and T2 hyperintense  lesion in the LEFT paramedian occipital bone, near the internal occipital protuberance, could represent an incidental hemangioma. No definite calvarial or skull base metastases within limits for detection due to motion. Sinuses/Orbits: Negative. Other: None. IMPRESSION: Noncontrast examination due to elevated creatinine. Subcentimeter focus of restricted diffusion RIGHT posterior cerebellum. This could represent a small acute infarct or a hypercellular metastasis. Given the patient's cell type is non small cell lung cancer, and that there is a similar but smaller chronic infarct in the RIGHT inferior cerebellar hemisphere, an acute infarct is favored. Electronically Signed   By: Staci Righter M.D.   On: 11/28/2017 20:38        Scheduled Meds: . sodium chloride   Intravenous Once  . albuterol  2.5 mg Nebulization Q6H  . atorvastatin  40 mg Oral Daily  . clopidogrel  75 mg Oral Q breakfast   Continuous Infusions: . ceFEPime (MAXIPIME) IV Stopped (11/29/17 0539)     LOS: 4 days     Georgette Shell, MD [facility]  If 7PM-7AM, please contact night-coverage [email or web address] Password [facility] 11/29/2017, 12:33 PM

## 2017-11-29 NOTE — Progress Notes (Signed)
Admit: 11/24/2017 LOS: 4  52F with AoCKD3 after carboplatin, IV Contrast exposrue x2, ETEC diarrheal illness, and admission with PNA.  Subjective:  . Stable SCr,  . MRI with ? Of acute CVA or cerebral metastases . 0.5L UOP . Pt not interactive; is awake  07/20 0701 - 07/21 0700 In: 0  Out: 550 [Urine:550]  Filed Weights   11/24/17 1955 11/25/17 1815  Weight: 57.6 kg (127 lb) 60.5 kg (133 lb 6.1 oz)    Scheduled Meds: . sodium chloride   Intravenous Once  . albuterol  2.5 mg Nebulization Q6H  . atorvastatin  40 mg Oral Daily  . clopidogrel  75 mg Oral Q breakfast   Continuous Infusions: . ceFEPime (MAXIPIME) IV Stopped (11/29/17 0539)   PRN Meds:.acetaminophen **OR** acetaminophen, albuterol, alum & mag hydroxide-simeth, ondansetron **OR** ondansetron (ZOFRAN) IV, traMADol  Current Labs: reviewed    Physical Exam:  Blood pressure 114/62, pulse (!) 110, temperature 99.1 F (37.3 C), temperature source Oral, resp. rate (!) 26, height 5\' 4"  (1.626 m), weight 60.5 kg (133 lb 6.1 oz), SpO2 94 %. GEN: NAD, elderly, chronically ill appearing ENT: poor dentition, NCAT EYES: Blind CV: tachy, regular,n l s1s2 PULM: CTAB ABD: s/nt/nd SKIN: no rashes/lesions EXT:No LEE  A 1. 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 ETEC diarrheal illness 4. Pneumonia 5. IV contrast exposure 7/7 and 7/11 6. COPD 7. PVD 8. HTN, BP stable, not on meds  P . Cont supportive care, will follow for another 24h but not much else to add; stable voluem status, acid/base, K . Not a candidate for short or long term HD . Suggest palliative care involvement . Hold on further diuretics at this time . Medication Issues; o Preferred narcotic agents for pain control are hydromorphone, fentanyl, and methadone. Morphine should not be used.  o Baclofen should be avoided o Avoid oral  sodium phosphate and magnesium citrate based laxatives / bowel preps    Pearson Grippe MD 11/29/2017, 10:43 AM  Recent Labs  Lab 11/27/17 0730 11/28/17 0325 11/29/17 0315  NA 138 141 143  K 4.4 4.5 3.6  CL 111 111 113*  CO2 18* 21* 20*  GLUCOSE 108* 110* 70  BUN 36* 38* 43*  CREATININE 2.29* 2.41* 2.39*  CALCIUM 8.7* 8.9 8.8*   Recent Labs  Lab 11/24/17 2033 11/25/17 0610 11/25/17 1112 11/26/17 0726  WBC 2.7* 4.3 4.0  --   NEUTROABS 1.6* 3.1  --   --   HGB 9.8* 8.3* 7.6* 9.2*  HCT 31.6* 26.8* 24.9* 29.3*  MCV 91.3 92.7 94.0  --   PLT 119* 136* 134*  --

## 2017-11-30 ENCOUNTER — Other Ambulatory Visit: Payer: Self-pay

## 2017-11-30 DIAGNOSIS — R062 Wheezing: Secondary | ICD-10-CM

## 2017-11-30 DIAGNOSIS — Z7189 Other specified counseling: Secondary | ICD-10-CM

## 2017-11-30 LAB — BASIC METABOLIC PANEL
ANION GAP: 10 (ref 5–15)
BUN: 47 mg/dL — ABNORMAL HIGH (ref 8–23)
CHLORIDE: 114 mmol/L — AB (ref 98–111)
CO2: 20 mmol/L — ABNORMAL LOW (ref 22–32)
Calcium: 8.7 mg/dL — ABNORMAL LOW (ref 8.9–10.3)
Creatinine, Ser: 2.29 mg/dL — ABNORMAL HIGH (ref 0.44–1.00)
GFR calc non Af Amer: 19 mL/min — ABNORMAL LOW (ref >60)
GFR, EST AFRICAN AMERICAN: 23 mL/min — AB (ref >60)
Glucose, Bld: 86 mg/dL (ref 70–99)
POTASSIUM: 4 mmol/L (ref 3.5–5.1)
SODIUM: 144 mmol/L (ref 135–145)

## 2017-11-30 LAB — CULTURE, BLOOD (ROUTINE X 2)
Culture: NO GROWTH
SPECIAL REQUESTS: ADEQUATE

## 2017-11-30 MED ORDER — MORPHINE SULFATE (PF) 2 MG/ML IV SOLN
0.5000 mg | INTRAVENOUS | Status: DC | PRN
  Administered 2017-11-30: 0.5 mg via INTRAVENOUS
  Filled 2017-11-30: qty 1

## 2017-11-30 MED ORDER — MORPHINE SULFATE (PF) 2 MG/ML IV SOLN
0.5000 mg | Freq: Once | INTRAVENOUS | Status: AC
  Administered 2017-11-30: 0.5 mg via INTRAVENOUS
  Filled 2017-11-30: qty 1

## 2017-11-30 MED ORDER — LORAZEPAM 1 MG PO TABS
1.0000 mg | ORAL_TABLET | Freq: Once | ORAL | Status: AC
  Administered 2017-11-30: 1 mg via ORAL
  Filled 2017-11-30: qty 1

## 2017-11-30 NOTE — Progress Notes (Signed)
PROGRESS NOTE    [name]  SEG:[id number] DOB: 09-17-1940 DOA: 11/24/2017 PCP: Sid Falcon, MD  Brief Narrative:77 y.o.femalewithrecently diagnosed non-small cell lung cancer had chemotherapy last week he was complaining of abdominal pain burning sensation with diarrhea. Patient has had recent multiple ED visits complaining of chronic pain and chest pain. She had some fever prior to arrival. Pain in the abdomen is generalized with some loose bowel movement. Patient is legally blind and difficulty here.  ED Course:In the ER patient was hypotensive febrile tachycardic with elevated lactate concerning for sepsis. Patient was given fluid bolus and started on empiric antibiotics. UA shows features concerning for UTI. Chest x-ray shows possible infiltrates.CT abdomen was unremarkable. On exam patient is not in distress and abdomen appears benign on exam. Will admit for sepsis likely from UTIand possible pneumonia.     Assessment & Plan:   Principal Problem:   Sepsis ([place]) Active Problems:   ARF (acute renal failure) ([place])   Acute lower UTI   Diarrhea   Pancytopenia ([place])   Hypoxia  1] healthcare associated pneumonia in a patient with history of lung CA status post recent chemotherapy.  Patient was treated with vancomycin and cefepime.  Vanco has been stopped since MRSA PCR came back negative and with acute on chronic kidney disease.  Pneumonia seems to be resolving, chest x-ray findings consistent with improvement.  She is 93% on room air white count normal.  Discussed with Dr. Julien Nordmann this morning patient not a candidate for chemotherapy at this time.  2] enterotoxigenic E. coli in the stool discussed with infectious disease over the phone plan was not to treat-since she is not neutropenic and had no further diarrhea.  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  This  was an incidental finding.  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  MRI was supposed to be done as an outpatient for metastatic involvement.  Since she was in the hospital the MRI was done here.  She has no signs of dysphagia or dysarthria or localized weakness.  She did speak to when she wants to.  Discussed with neurology on-call no further management was recommended at this time.  Patient had an echocardiogram ejection fraction 60 to 65%.  Small echodensity attached to the tricuspid valve measuring 7 x 4 mm if clinically indicated consider TEE for further evaluation.  We will hold off on further work-up with TEE.  4] AKI with CKD stage III by renal mild improvements noted.  Patient not a hemodialysis candidate anyhow.    5] pancytopenia secondary to chemo  6]  non-small cell lung CA status post recent chemo will discuss with Dr. Julien Nordmann patient is not a candidate for chemotherapy at this time.  Await palliative care input.  Discussed with daughter-in-law [name] [phone].     DVT prophylaxis:scd Code Status:partial code Family Communication:dw DIL  Disposition Plan:   Consultants:   Renal Palliative consult pending Procedures: None Antimicrobials: Maxipime  Subjective: Resting in bed in no acute distress staff reports no events overnight no nausea vomiting diarrhea.  She ate breakfast this morning.   Objective: Vitals:   11/30/17 0800 11/30/17 0802 11/30/17 0900 11/30/17 1000  BP: (!) 149/65  (!) 142/61 (!) 123/56  Pulse: (!) 118     Resp: (!) 25  (!) 24 (!) 22  Temp: 98.2 F (36.8 C)     TempSrc: Axillary  SpO2: 97% 97% 95% 96%  Weight:      Height:        Intake/Output Summary (Last 24 hours) at 11/30/2017 1053 Last data filed at 11/30/2017 0900 Gross per 24 hour  Intake 1718.33 ml  Output 300 ml  Net 1418.33 ml   Filed Weights   11/24/17 1955 11/25/17 1815  Weight: 57.6 kg (127 lb) 60.5 kg (133 lb 6.1 oz)    Examination:  General  exam: Appears calm and comfortable  Respiratory system: Scattered rhonchi to auscultation. Respiratory effort normal. Cardiovascular system: S1 & S2 heard, RRR. No JVD, murmurs, rubs, gallops or clicks. No pedal edema. Gastrointestinal system: Abdomen is nondistended, soft and nontender. No organomegaly or masses felt. Normal bowel sounds heard. Central nervous system: Alert and oriented. No focal neurological deficits. Extremities: Symmetric 5 x 5 power. Skin: No rashes, lesions or ulcers Psychiatry: Judgement and insight appear normal. Mood & affect appropriate.     Data Reviewed: I have personally reviewed following labs and imaging studies  CBC: Recent Labs  Lab 11/24/17 2033 11/25/17 0610 11/25/17 1112 11/26/17 0726  WBC 2.7* 4.3 4.0  --   NEUTROABS 1.6* 3.1  --   --   HGB 9.8* 8.3* 7.6* 9.2*  HCT 31.6* 26.8* 24.9* 29.3*  MCV 91.3 92.7 94.0  --   PLT 119* 136* 134*  --    Basic Metabolic Panel: Recent Labs  Lab 11/26/17 0749 11/27/17 0730 11/28/17 0325 11/29/17 0315 11/30/17 0400  NA 138 138 141 143 144  K 4.4 4.4 4.5 3.6 4.0  CL 113* 111 111 113* 114*  CO2 16* 18* 21* 20* 20*  GLUCOSE 115* 108* 110* 70 86  BUN 34* 36* 38* 43* 47*  CREATININE 2.15* 2.29* 2.41* 2.39* 2.29*  CALCIUM 8.6* 8.7* 8.9 8.8* 8.7*   GFR: Estimated Creatinine Clearance: 17.8 mL/min (A) (by C-G formula based on SCr of 2.29 mg/dL (H)). Liver Function Tests: Recent Labs  Lab 11/24/17 2033 11/25/17 0610 11/25/17 1112  AST 47* 45* 40  ALT 42 45* 42  ALKPHOS 81 71 64  BILITOT 0.3 0.7 0.7  PROT 5.6* 5.6* 5.4*  ALBUMIN 2.1* 2.1* 2.0*   Recent Labs  Lab 11/24/17 2033  LIPASE 20   No results for input(s): AMMONIA in the last 168 hours. Coagulation Profile: No results for input(s): INR, PROTIME in the last 168 hours. Cardiac Enzymes: No results for input(s): CKTOTAL, CKMB, CKMBINDEX, TROPONINI in the last 168 hours. BNP (last 3 results) No results for input(s): PROBNP in the last  8760 hours. HbA1C: No results for input(s): HGBA1C in the last 72 hours. CBG: No results for input(s): GLUCAP in the last 168 hours. Lipid Profile: No results for input(s): CHOL, HDL, LDLCALC, TRIG, CHOLHDL, LDLDIRECT in the last 72 hours. Thyroid Function Tests: No results for input(s): TSH, T4TOTAL, FREET4, T3FREE, THYROIDAB in the last 72 hours. Anemia Panel: No results for input(s): VITAMINB12, FOLATE, FERRITIN, TIBC, IRON, RETICCTPCT in the last 72 hours. Sepsis Labs: Recent Labs  Lab 11/24/17 2041 11/24/17 2324 11/25/17 1519 11/25/17 1822  LATICACIDVEN 2.44* 0.89 1.5 1.5    Recent Results (from the past 240 hour(s))  Blood culture (routine x 2)     Status: None   Collection Time: 11/24/17  8:33 PM  Result Value Ref Range Status   Specimen Description   Final    BLOOD RIGHT WRIST Performed at [facility] [address] [place], [place] [address]    Special Requests  Final    BOTTLES DRAWN AEROBIC AND ANAEROBIC Blood Culture adequate volume Performed at [facility] [address] [place], [place] [address]    Culture   Final    NO GROWTH 5 DAYS Performed at [facility], [place] [address] [place], [place] [address]    Report Status 11/29/2017 FINAL  Final  Blood culture (routine x 2)     Status: None (Preliminary result)   Collection Time: 11/24/17  8:33 PM  Result Value Ref Range Status   Specimen Description   Final    BLOOD RIGHT ANTECUBITAL Performed at [facility] [address], [place], [place] [address]    Special Requests   Final    BOTTLES DRAWN AEROBIC AND ANAEROBIC Blood Culture adequate volume Performed at [facility] [address] [place], [place] [address]    Culture   Final    NO GROWTH 4 DAYS Performed at [facility], [place] [address] [place], [place] [address]    Report Status PENDING  Incomplete  Urine culture     Status: Abnormal    Collection Time: 11/24/17 11:36 PM  Result Value Ref Range Status   Specimen Description   Final    URINE, CLEAN CATCH Performed at [facility], [place] [address], [place], [place] [address]    Special Requests   Final    NONE Performed at [facility], [place] [address] [place], [place] [address]    Culture (A)  Final    >=100,000 COLONIES/mL LACTOBACILLUS SPECIES Standardized susceptibility testing for this organism is not available. Performed at [facility], [place] [address] [place], [place] [address]    Report Status 11/26/2017 FINAL  Final  MRSA PCR Screening     Status: None   Collection Time: 11/25/17  6:18 PM  Result Value Ref Range Status   MRSA by PCR NEGATIVE NEGATIVE Final    Comment:        The GeneXpert MRSA Assay (FDA approved for NASAL specimens only), is one component of a comprehensive MRSA colonization surveillance program. It is not intended to diagnose MRSA infection nor to guide or monitor treatment for MRSA infections. Performed at [facility], [place] [address] [place], [place] [address]   C difficile quick scan w PCR reflex     Status: None   Collection Time: 11/26/17  4:06 AM  Result Value Ref Range Status   C Diff antigen NEGATIVE NEGATIVE Final   C Diff toxin NEGATIVE NEGATIVE Final   C Diff interpretation No C. difficile detected.  Final    Comment: Performed at [facility], [place] [address] [place], [place] [address]  Gastrointestinal Panel by PCR , Stool     Status: Abnormal   Collection Time: 11/26/17  4:06 AM  Result Value Ref Range Status   Campylobacter species NOT DETECTED NOT DETECTED Final   Plesimonas shigelloides NOT DETECTED NOT DETECTED Final   Salmonella species NOT DETECTED NOT DETECTED Final   Yersinia enterocolitica NOT DETECTED NOT DETECTED Final   Vibrio species NOT DETECTED NOT DETECTED Final   Vibrio cholerae NOT DETECTED NOT DETECTED Final    Enteroaggregative E coli (EAEC) NOT DETECTED NOT DETECTED Final   Enteropathogenic E coli (EPEC) NOT DETECTED NOT DETECTED Final   Enterotoxigenic E coli (ETEC) DETECTED (A) NOT DETECTED Final    Comment: RESULT CALLED TO, READ BACK BY AND VERIFIED WITH: SARAH BETH EARLY @  1836 ON 11/26/2017 BY CAF    Shiga like toxin producing E coli (STEC) NOT DETECTED NOT DETECTED Final   Shigella/Enteroinvasive E coli (EIEC) NOT DETECTED NOT DETECTED Final   Cryptosporidium NOT DETECTED NOT DETECTED Final   Cyclospora cayetanensis NOT DETECTED NOT DETECTED Final   Entamoeba histolytica NOT DETECTED NOT DETECTED Final   Giardia lamblia NOT DETECTED NOT DETECTED Final   Adenovirus F40/41 NOT DETECTED NOT DETECTED Final   Astrovirus NOT DETECTED NOT DETECTED Final   Norovirus GI/GII NOT DETECTED NOT DETECTED Final   Rotavirus A NOT DETECTED NOT DETECTED Final   Sapovirus (I, II, IV, and V) NOT DETECTED NOT DETECTED Final    Comment: Performed at [facility], [address], [place], [place] [address]         Radiology Studies: Mr Brain Wo Contrast  Result Date: 11/28/2017 CLINICAL DATA:  Lung CA staging. COPD. Hypertension. Acute on chronic kidney injury. Confusion. EXAM: MRI HEAD WITHOUT CONTRAST TECHNIQUE: Multiplanar, multiecho pulse sequences of the brain and surrounding structures were obtained without intravenous contrast. COMPARISON:  None. FINDINGS: Brain: Subcentimeter focus of restricted diffusion, corresponding low ADC, RIGHT cerebellar cortex posteriorly, see series 3, image 7, series 7, image 8. No other similar abnormalities. This could represent a small acute infarct, or a hypercellular metastasis. Generalized atrophy. Extensive T2 and FLAIR hyperintensities in the white matter, likely small vessel disease. There is a tiny chronic infarct in the RIGHT inferior cerebellum, more laterally. Vascular: Flow voids are maintained.  No chronic hemorrhage. Skull and upper cervical spine:  Subcentimeter T1 and T2 hyperintense lesion in the LEFT paramedian occipital bone, near the internal occipital protuberance, could represent an incidental hemangioma. No definite calvarial or skull base metastases within limits for detection due to motion. Sinuses/Orbits: Negative. Other: None. IMPRESSION: Noncontrast examination due to elevated creatinine. Subcentimeter focus of restricted diffusion RIGHT posterior cerebellum. This could represent a small acute infarct or a hypercellular metastasis. Given the patient's cell type is non small cell lung cancer, and that there is a similar but smaller chronic infarct in the RIGHT inferior cerebellar hemisphere, an acute infarct is favored. Electronically Signed   By: Staci Righter M.D.   On: 11/28/2017 20:38        Scheduled Meds: . sodium chloride   Intravenous Once  . atorvastatin  40 mg Oral Daily  . clopidogrel  75 mg Oral Q breakfast  . ipratropium-albuterol  3 mL Nebulization TID   Continuous Infusions: . sodium chloride 75 mL/hr at 11/30/17 0900  . ceFEPime (MAXIPIME) IV 200 mL/hr at 11/30/17 0900     LOS: 5 days      Georgette Shell, MD [facility]  If 7PM-7AM, please contact night-coverage [email or web address] Password [facility] 11/30/2017, 10:53 AM

## 2017-11-30 NOTE — Consult Note (Signed)
Consultation Note Date: 11/30/17  Patient Name: [name]  DOB: 02-22-1941  MRN: [id number]  Age / Sex: 77 y.o., female  PCP: Sid Falcon, MD Referring Physician: Georgette Shell, MD  Reason for Consultation: Establishing goals of care and Terminal Care  HPI/Patient Profile: 77 y.o. female  with past medical history of HTN, HLD, COPD, CKD, DVT, anemia, arthritis, legally blind admitted on 11/24/2017 with diarrhea, abdominal pain. In ED, patient with hypotension, tachycardia, febrile, and elevated lactate concerning for sepsis. UA concerning for UTI. Chest xray showed possible infiltrates. CT abdomen unremarkable. Starting on antibiotics for HCAP. 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 Patient newly diagnosed with non-small cell lung cancer s/p one round of chemotherapy. Also with AKI superimposed on CKD. Patient is not a candidate for dialysis. Per oncology, patient is not a candidate for further chemotherapy. Palliative medicine consultation for goals of care.   Clinical Assessment and Goals of Care:  I have reviewed medical records, discussed with care team, and met with patient, son [name]) and DIL [name]) initially at bedside to discuss diagnosis prognosis, Justice, EOL wishes, disposition and options.  Upon arrival to room, patient with dyspnea, tachypnea, and using accessory muscles. She tells me she is short of breath and with right side/chest pain (which has been ongoing this admission). RN at bedside. O2 sats 97-98% on room air. Patient with bilateral wheezing on ausculation. RT to give prn albuterol treatment. RN to give morphine 0.18m IV x1 for dyspnea.  MHinda Glatter and I left the room to speak in conference room.   I introduced Palliative Medicine as specialized medical care for people living with serious illness. It focuses on providing relief from the symptoms and  stress of a serious illness. The goal is to improve quality of life for both the patient and the family.  We discussed a brief life review of the patient. Family shares that she has been an independent and active individual throughout her life. She greatly enjoys playing the piano and organ for her 7 grandchildren. MTrilby Drummeris her only child. Patient newly diagnosed with cancer in May. MTrilby Drummershares that she initially declined treatment options but then changed her mind and decided to pursue chemotherapy. She had one round of chemotherapy at the beginning of July, and since has been declining functionally and nutritionally. Prior to this first round of chemo[place]   Discussed hospital diagnoses and interventions. Discussed concern with respiratory status leading to decline. Also discussed incurable nature of her cancer and all treatment being palliative in nature. MTrilby Drummerand CWarthenhave a good understanding of diagnoses, interventions, and guarded prognosis. They understand she is not a candidate for further chemotherapy.   I attempted to elicit values and goals of care important to the patient. Advanced directives, concepts specific to code status, artifical feeding and hydration, and rehospitalization were considered and discussed. Ms. CStrohmeierdoes not have a documented living will and has not discussed her EOL wishes  with [name]. He speaks of wishing to attempt discussion with her in order to honor her wishes.   I did encourage [name] and [name] to consider limitations to care including no resuscitation, life support, or feeding tube with underlying incurable cancer. Hard Choices copy and MOST form given for review. They understand if Ms. Catala is not decisional, they are faced with big decisions in regards to her care.   The difference between aggressive medical intervention and comfort care was discussed. Explained role of symptom management to  relieve pain and suffering. Family speaks of wanting her to be "comfortable" but again not ready to make decisions about transition to comfort care until they speak with her.   Discussed outpatient palliative versus hospice options. At this point, family feels she may need to go to rehab. [name] hopeful she can gain some strength from physical therapy. He also seems to be realistic that this may not be possible with underlying progressive cancer and pneumonia contributing to respiratory distress and overall health decline. Discussed her eligibility for hospice services if she is unable to participate in therapy.   Questions and concerns were addressed. PMT contact information given. Emotional support provided.    SUMMARY OF RECOMMENDATIONS    Good initial palliative discussion with son and DIL. Patient unable to participate in goals during my visit.   Family has a good understanding of diagnoses, interventions, and guarded prognosis with underlying incurable cancer. They understand she is NOT a candidate for further chemotherapy.   Patient does not have a documented living will and has not spoken of her wishes to her son. Son wishes to attempt discussion with his mother regarding her wishes on resuscitation/life support efforts. Educated on medical recommendations against heroic measures at EOL with underlying incurable cancer. Hard Choices copy given for review.   Family leaning towards SNF for rehab if patient able to participate with therapy. If not, they are willing to consider hospice options.   PMT will follow this hospitalization. If d/c'd to SNF for rehab, will greatly benefit from outpatient palliative referral.   Code Status/Advance Care Planning:  Limited code  Symptom Management:   Morphine 0.75m IV x1 now   Morphine 0.580mIV q4h prn pain/dyspnea/tachypnea  Palliative Prophylaxis:   Aspiration, Delirium Protocol, Frequent Pain Assessment, Oral Care and Turn  Reposition  Psycho-social/Spiritual:   Desire for further Chaplaincy support: yes  Additional Recommendations: Caregiving  Support/Resources and Education on Hospice  Prognosis:   Unable to determine: poor prognosis with new diagnosis of stage IV non-small cell lung cancer, HCAP, and declining functional and nutritional status.   Discharge Planning: To Be Determined likely will need SNF     Primary Diagnoses: Present on Admission: . Sepsis ([place]. ARF (acute renal failure) (H[facility]. Acute lower UTI . Diarrhea . Pancytopenia (HCH[place]  I have reviewed the medical record, interviewed the patient and family, and examined the patient. The following aspects are pertinent.  Past Medical History:  Diagnosis Date  . Anemia   . Arthritis    "left shoulder" (04/07/2016)  . Blind    s/p bilateral corneal transplant; "legally blind in both eyes" (04/07/2016)  . Cancer ([facility]   lung  . CKD (chronic kidney disease), stage III (HCCrock[place]  . COPD (chronic obstructive pulmonary disease) ([facility]CA[facility]   pt denies  . DVT (deep venous thrombosis) (HCCorry"years ago"   "right thigh"    . HEARING LOSS   . Hyperlipidemia   . Hypertension   .  PAD (peripheral artery disease) ([place])    a. 03/2016 Periph Angio: L SFA 99p, 40mw/ one vessel runoff. R SFA 1037m/ one vessel runofff;  b. 04/07/2016 PTA L SFA ([place] 1 LS atherecromty w/ DEBA). c. 04/24/16 s/p PTA to R SFA, right tibioperoneal trunk and posterior tibial artery.  . Phlebitis   . Squamous cell carcinoma lung (HCSal[place]  . Tobacco abuse    Social History   Socioeconomic History  . Marital status: Divorced    Spouse name: Not on file  . Number of children: Not on file  . Years of education: Not on file  . Highest education level: Not on file  Occupational History  . Not on file  Social Needs  . Financial resource strain: Not on file  . Food insecurity:    Worry: Not on file    Inability: Not on file  . Transportation needs:    Medical: Not on  file    Non-medical: Not on file  Tobacco Use  . Smoking status: Former Smoker    Packs/day: 0.50    Years: 62.00    Pack years: 31.00    Types: Cigarettes  . Smokeless tobacco: Never Used  . Tobacco comment: Quit 06/2017  Substance and Sexual Activity  . Alcohol use: No    Alcohol/week: 0.0 oz  . Drug use: No  . Sexual activity: Never    Birth control/protection: Post-menopausal  Lifestyle  . Physical activity:    Days per week: Not on file    Minutes per session: Not on file  . Stress: Not on file  Relationships  . Social connections:    Talks on phone: Not on file    Gets together: Not on file    Attends religious service: Not on file    Active member of club or organization: Not on file    Attends meetings of clubs or organizations: Not on file    Relationship status: Not on file  Other Topics Concern  . Not on file  Social History Narrative   Patient currently lives at home alone, but does have a home worker who visits her throughout the week to help with things around the house.  Patient states that she still tries to remain active and walks daily.  She is has been divorced for sometime and has one son.  She currently lives off social security.  Patient reports using approximately half a pack of cigarettes daily for 20 years.  She denies any alcohol or illicit drug use.  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     Family History  Problem Relation Age of Onset  . Cancer Maternal Grandmother        liver  . Cancer Maternal Uncle        colon  . Cancer Mother        liver  . Cancer Brother        Unknown  . Cancer Sister        Unknown   Scheduled Meds: . sodium chloride   Intravenous Once  . atorvastatin  40 mg Oral Daily  . clopidogrel  75 mg Oral Q breakfast  . ipratropium-albuterol  3 mL Nebulization TID   Continuous Infusions: . sodium chloride 75 mL/hr at 11/30/17 2031  . ceFEPime  (MAXIPIME) IV Stopped (12/01/17 0415)   PRN Meds:.acetaminophen **OR** acetaminophen, albuterol, alum & mag hydroxide-simeth, morphine injection, ondansetron **OR** ondansetron (ZOFRAN)  IV, traMADol Medications Prior to Admission:  Prior to Admission medications   Medication Sig Start Date End Date Taking? Authorizing Provider  atorvastatin (LIPITOR) 40 MG tablet Take 1 tablet (40 mg total) by mouth daily. 06/17/17  Yes Sid Falcon, MD  clopidogrel (PLAVIX) 75 MG tablet Take 1 tablet (75 mg total) by mouth daily with breakfast. Okay to restart this medication on Sunday 10/18/2017. 10/16/17  Yes Collene Gobble, MD  HYDROcodone-acetaminophen (NORCO/VICODIN) 5-325 MG tablet Take 1 tablet by mouth every 4 (four) hours as needed. Patient taking differently: Take 1 tablet by mouth every 4 (four) hours as needed for moderate pain or severe pain.  11/22/17  Yes Pattricia Boss, MD  albuterol (PROVENTIL HFA;VENTOLIN HFA) 108 (90 Base) MCG/ACT inhaler Inhale 2 puffs into the lungs every 6 (six) hours as needed for wheezing or shortness of breath. 08/15/15   Sid Falcon, MD  hydrocortisone (ANUSOL-HC) 2.5 % rectal cream Apply rectally 2 times daily 11/21/17   Veryl Speak, MD  hydrocortisone (ANUSOL-HC) 25 MG suppository Place 1 suppository (25 mg total) rectally 2 (two) times daily. Patient not taking: Reported on 11/20/2017 11/18/17   Isla Pence, MD  polyethylene glycol [facility]) packet Take 17 g by mouth daily. Patient not taking: Reported on 11/20/2017 11/18/17   Isla Pence, MD  prochlorperazine (COMPAZINE) 10 MG tablet Take 1 tablet (10 mg total) by mouth every 6 (six) hours as needed for nausea or vomiting. Patient not taking: Reported on 11/20/2017 11/17/17   Curt Bears, MD  Spacer/Aero-Holding Chambers (AEROCHAMBER MV) inhaler Use as instructed 10/06/17   Magdalen Spatz, NP  enoxaparin (LOVENOX) 80 MG/0.8ML SOLN injection Inject 0.8 mLs (80 mg total) into the skin every 12 (twelve) hours.  07/09/11 07/22/11  Hoyt Koch, MD   Allergies  Allergen Reactions  . Codeine Other (See Comments)    REACTION: Pt c/o "feeling high"  . Morphine Other (See Comments)    REACTION: feels funny and "high"   Review of Systems  Constitutional: Positive for activity change and appetite change.       Right side/chest pain  Respiratory: Positive for cough and shortness of breath.   Cardiovascular: Positive for leg swelling.  Neurological: Positive for weakness.    Physical Exam  Constitutional: She is oriented to person, place, and time. She appears distressed.  HENT:  Head: Normocephalic and atraumatic.  Cardiovascular: Regular rhythm.  Pulmonary/Chest: Accessory muscle usage present. Tachypnea noted. She is in respiratory distress. She has decreased breath sounds. She has wheezes.  RT at bedside giving prn albuterol. RN to give Morphine 0.27m IV x1 for dyspnea.  Abdominal: Normal appearance. There is no tenderness.  Neurological: She is alert and oriented to person, place, and time.  Hard of hearing, legally blind  Skin: Skin is warm and dry.  BLE non-pitting edema  Psychiatric: Her speech is slurred. She is inattentive.  Nursing note and vitals reviewed.  Vital Signs: BP 132/60 (BP Location: Right Arm)   Pulse (!) 106   Temp 98.5 F (36.9 C)   Resp 16   Ht '5\' 4"'  (1.626 m)   Wt 60.5 kg (133 lb 6.1 oz)   LMP  (LMP Unknown)   SpO2 90%   BMI 22.89 kg/m  Pain Scale: 0-10 POSS *See Group Information*: S-Acceptable,Sleep, easy to arouse Pain Score: 0-No pain   SpO2: SpO2: 90 % O2 Device:SpO2: 90 % O2 Flow Rate: .O2 Flow Rate (L/min): 3 L/min  IO: Intake/output summary:   Intake/Output Summary (  Last 24 hours) at 12/01/2017 7076 Last data filed at 12/01/2017 0900 Gross per 24 hour  Intake 2545 ml  Output 300 ml  Net 2245 ml    LBM: Last BM Date: 11/28/17 Baseline Weight: Weight: 57.6 kg (127 lb) Most recent weight: Weight: 60.5 kg (133 lb 6.1 oz)      Palliative Assessment/Data: PPS 40%   Flowsheet Rows     Most Recent Value  Intake Tab  Referral Department  Hospitalist  Unit at Time of Referral  Med/Surg Unit  Palliative Care Primary Diagnosis  Cancer  Palliative Care Type  New Palliative care  Reason for referral  Clarify Goals of Care  Date first seen by Palliative Care  11/30/17  Clinical Assessment  Palliative Performance Scale Score  40%  Psychosocial & Spiritual Assessment  Palliative Care Outcomes  Patient/Family meeting held?  Yes  Who was at the meeting?  patient, son, daughter-in-law  Palliative Care Outcomes  Clarified goals of care, Counseled regarding hospice, Provided psychosocial or spiritual support, ACP counseling assistance, Provided end of life care assistance, Improved pain interventions, Improved non-pain symptom therapy      Time In: 1600 Time Out: 1725 Time Total: 45mn Greater than 50%  of this time was spent counseling and coordinating care related to the above assessment and plan.  Signed by:  MIhor Dow FNP-C Palliative Medicine Team  Phone: [phone]: [phone]  Please contact Palliative Medicine Team phone at [phone] questions and concerns.  For individual provider: See AShea Evans

## 2017-11-30 NOTE — Progress Notes (Signed)
PMT consult received and chart reviewed. Met with patient's son and daughter-in-law in conference room to discuss goals of care. Upon arrival to room, patient with dyspnea, tachypnea, and accessory muscle use. Per DIL, RN, and RT, this is patient's baseline respiratory status since admission. 98% on room air. Patient verbalizes that she is short of breath and with pain on right side (chest). Patient received breathing treatment. RN to administer Morphine 0.82m IV x1 now.   Good initial GMaltaconversation with MTrilby Drummerand CBrewer The patient does not have a documented living will. They wish to discuss wishes with patient if and when she is more comfortable to have a conversation regarding her wishes. MTrilby Drummerand CAinsworthdo seem to have a good understanding of diagnoses, interventions, and guarded prognosis. Continue current plan of care at this time.   Full note to follow. Updated attending.   NO CHARGE  MIhor Dow FNP-C Palliative Medicine Team  Phone: [phone]: [phone]

## 2017-11-30 NOTE — Progress Notes (Signed)
Admit: 11/24/2017 LOS: 5  61F with AoCKD3 after carboplatin, IV Contrast exposrue x2, ETEC diarrheal illness, and admission with PNA.  Subjective:  . Serum creat down slightly last 2 days, 2.22 today  07/21 0701 - 07/22 0700 In: 161 [P.O.:60; I.V.:755; IV Piggyback:100] Out: 600 [Urine:600]  Filed Weights   11/24/17 1955 11/25/17 1815  Weight: 57.6 kg (127 lb) 60.5 kg (133 lb 6.1 oz)    Scheduled Meds: . sodium chloride   Intravenous Once  . atorvastatin  40 mg Oral Daily  . clopidogrel  75 mg Oral Q breakfast  . ipratropium-albuterol  3 mL Nebulization TID   Continuous Infusions: . sodium chloride 75 mL/hr at 11/30/17 1400  . ceFEPime (MAXIPIME) IV 200 mL/hr at 11/30/17 0900   PRN Meds:.acetaminophen **OR** acetaminophen, albuterol, alum & mag hydroxide-simeth, ondansetron **OR** ondansetron (ZOFRAN) IV, traMADol  Current Labs: reviewed    Physical Exam:  Blood pressure 131/72, pulse (!) 117, temperature 97.7 F (36.5 C), temperature source Oral, resp. rate 18, height 5\' 4"  (1.626 m), weight 60.5 kg (133 lb 6.1 oz), SpO2 95 %. GEN: NAD, elderly, chronically ill appearing ENT: poor dentition, NCAT EYES: Blind CV: tachy, regular,n l s1s2 PULM: CTAB ABD: s/nt/nd SKIN: no rashes/lesions EXT:No LEE  A 1. AoCKD3, likely multifactorial including CIN, platinum exposure, hypovolemia/acute illness; improved somewhat , creat down today slightly. May have new baseline given exposures. Baseline creat was 1.5- 1.9.  No further suggestions, will sign off.  2. 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 ETEC diarrheal illness 4. Pneumonia 5. IV contrast exposure 7/7 and 7/11 6. COPD 7. PVD 8. HTN, BP stable, not on meds  P . Not a candidate for short or long term HD . Suggest palliative care involvement . Hold on further diuretics at this time . Medication Issues; o Preferred narcotic agents for pain control are hydromorphone, fentanyl, and  methadone. Morphine should not be used.  o Baclofen should be avoided o Avoid oral sodium phosphate and magnesium citrate based laxatives / bowel preps    Pearson Grippe MD 11/30/2017, 3:55 PM  Recent Labs  Lab 11/28/17 0325 11/29/17 0315 11/30/17 0400  NA 141 143 144  K 4.5 3.6 4.0  CL 111 113* 114*  CO2 21* 20* 20*  GLUCOSE 110* 70 86  BUN 38* 43* 47*  CREATININE 2.41* 2.39* 2.29*  CALCIUM 8.9 8.8* 8.7*   Recent Labs  Lab 11/24/17 2033 11/25/17 0610 11/25/17 1112 11/26/17 0726  WBC 2.7* 4.3 4.0  --   NEUTROABS 1.6* 3.1  --   --   HGB 9.8* 8.3* 7.6* 9.2*  HCT 31.6* 26.8* 24.9* 29.3*  MCV 91.3 92.7 94.0  --   PLT 119* 136* 134*  --

## 2017-11-30 NOTE — Patient Outreach (Signed)
[facility]) Care Management  11/30/2017  [name] [birth year] [id number]   [name] was referred to [facility] for [employer].  BSW is signing off at this time due to [name] being hospitalized for more than ten days.  BSW will become involved again if referral is made for social work after hospital discharge.   Ronn Melena, BSW Social Worker [phone]

## 2017-12-01 ENCOUNTER — Inpatient Hospital Stay: Payer: Medicare Other

## 2017-12-01 DIAGNOSIS — Z515 Encounter for palliative care: Secondary | ICD-10-CM

## 2017-12-01 DIAGNOSIS — J189 Pneumonia, unspecified organism: Secondary | ICD-10-CM

## 2017-12-01 DIAGNOSIS — N170 Acute kidney failure with tubular necrosis: Secondary | ICD-10-CM

## 2017-12-01 DIAGNOSIS — A09 Infectious gastroenteritis and colitis, unspecified: Secondary | ICD-10-CM

## 2017-12-01 DIAGNOSIS — C3491 Malignant neoplasm of unspecified part of right bronchus or lung: Secondary | ICD-10-CM

## 2017-12-01 DIAGNOSIS — R0603 Acute respiratory distress: Secondary | ICD-10-CM

## 2017-12-01 MED ORDER — ALPRAZOLAM 0.25 MG PO TABS
0.2500 mg | ORAL_TABLET | Freq: Two times a day (BID) | ORAL | Status: DC | PRN
  Administered 2017-12-01: 0.25 mg via ORAL
  Filled 2017-12-01: qty 1

## 2017-12-01 MED ORDER — HYDROCODONE-ACETAMINOPHEN 5-325 MG PO TABS: 1.0000 | ORAL_TABLET | ORAL | 0 refills | Status: DC | PRN

## 2017-12-01 MED ORDER — ALPRAZOLAM 0.25 MG PO TABS: 0.2500 mg | ORAL_TABLET | Freq: Two times a day (BID) | ORAL | 0 refills | Status: AC | PRN

## 2017-12-01 MED ORDER — HYDROCODONE-ACETAMINOPHEN 5-325 MG PO TABS
1.0000 | ORAL_TABLET | ORAL | Status: DC | PRN
  Administered 2017-12-01 – 2017-12-02 (×5): 1 via ORAL
  Filled 2017-12-01 (×5): qty 1

## 2017-12-01 NOTE — Clinical Social Work Note (Signed)
LCSW consulted at dc for SNF placement.   Patient was assessed last week at [facility]. Per original assessment patient had no family support.   Family has been present and supportive this hospital stay. Patient is only oriented to self.   Patient's daughter in law reports that patient was living in independent living prior to hospital stay. At baseline she reports patient uses a walker and is independent in ADLs.   PT recommends SNF. Oncology notes that patient is not responding well to chemotherapy and recommends palliative care or hospice. After palliative meeting family would like to attempt rehab with palliative to follow. Family hoping to transition patient home after rehab.   LCSW will follow for dc needs.   Carolin Coy [place] CSW [phone]    Clinical Social Work Assessment  Patient Details  Name: [name] MRN: [id number] Date of Birth: July 28, 1940  Date of referral:                  Reason for consult:                   Permission sought to share information with:    Permission granted to share information::     Name::        Agency::     Relationship::     Contact Information:     Housing/Transportation Living arrangements for the past 2 months:    Source of Information:    Patient Interpreter Needed:    Criminal Activity/Legal Involvement Pertinent to Current Situation/Hospitalization:    Significant Relationships:    Lives with:    Do you feel safe going back to the place where you live?    Need for family participation in patient care:     Care giving concerns:  CSW consulted due to concerns of no family involvement with the patient's care and patient not taking medication for pain. Patient is blind and receiving chemotherapy. Patient resides at [facility].   Social Worker assessment / plan:  CSW met with patient at bedside to complete assessment.  Patient states she has not taken her pain medications by choice, stating  she will take "one or two when I really need them." Patient denies issues accessing medications. Patient reports feeling safe and agreeable to returning to [facility]; patient reports she likes the food and has taken [facility] to get to doctors appointments from the facility. Patient reportedly uses a walker to ambulate.  Patient reports that her adult son "won't come near me," and "doesn't want to see me like this." Patient states that her extended family lives in [place] and they are not involved in her care either, citing distance.   Patient inquired about in-home health, CSW to consult Case management.  Employment status:    Insurance information:    PT Recommendations:    Information / Referral to community resources:     Patient/Family's Response to care:  Patient was pleasant and agreeable, at times appearing anxious. Patient voiced thanks and appreciation for CSW intervention and satisfaction with hospital care.  Patient/Family's Understanding of and Emotional Response to Diagnosis, Current Treatment, and Prognosis:   Patient agreeable to discharging back to ALF and continuing with her chemotherapy treatment and pain management medications.   Emotional Assessment Appearance:    Attitude/Demeanor/Rapport:    Affect (typically observed):    Orientation:    Alcohol / Substance use:    Psych involvement (Current and /or in the community):  Discharge Needs  Concerns to be addressed:    Readmission within the last 30 days:    Current discharge risk:    Barriers to Discharge:      Servando Snare, LCSW 12/01/2017, 12:06 PM

## 2017-12-01 NOTE — Discharge Summary (Signed)
Physician Discharge Summary  [name] SEG:[id number] DOB: 1940/11/18 DOA: 11/24/2017  PCP: Sid Falcon, MD  Admit date: 11/24/2017 Discharge date: 12/01/2017  Admitted From: Home Disposition: Nursing home  Recommendations for Outpatient Follow-up:  1. Follow up with PCP in 1-2 weeks 2. Please obtain BMP/CBC in one week 3. Palliative care to follow patient at the SNF.  Home Health: None Equipment/Devices: None Discharge Condition stable on palliative care CODE STATUS:DNI /PARTIAL CODE Diet recommendation:REGULAR Brief/Interim Summary::77 y.o.femalewithrecently diagnosed non-small cell lung cancer had chemotherapy last week he was complaining of abdominal pain burning sensation with diarrhea. Patient has had recent multiple ED visits complaining of chronic pain and chest pain. She had some fever prior to arrival. Pain in the abdomen is generalized with some loose bowel movement. Patient is legally blind and difficulty here.  ED Course:In the ER patient was hypotensive febrile tachycardic with elevated lactate concerning for sepsis. Patient was given fluid bolus and started on empiric antibiotics. UA shows features concerning for UTI. Chest x-ray shows possible infiltrates.CT abdomen was unremarkable. On exam patient is not in distress and abdomen appears benign on exam. Will admit for sepsis likely from UTIand possible pneumonia.     Discharge Diagnoses:  Principal Problem:   Sepsis ([place]) Active Problems:   Pneumonia   ARF (acute renal failure) ([place])   Acute lower UTI   Diarrhea   Pancytopenia ([place])   Hypoxia   Wheezing   Palliative care by specialist   Acute respiratory distress  1]healthcare associated pneumonia in a patient with history of lung CA status post recent chemotherapy. Patient was treated with vancomycin and cefepime. Vanco has been stopped since MRSA PCR came back negative and with acute on chronic kidney disease. Pneumonia seems  to be resolving,chest x-ray findings consistent with improvement. She is 78% on room air white count normal.  Discussed with Dr. Julien Nordmann this morning patient not a candidate for chemotherapy at this time.  2]enterotoxigenic E. coli in the stool discussed with infectious disease over the phone plan was not to treat-since she is not neutropenic and had no further diarrhea.  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 This was an incidental finding. 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 MRI was supposed to be done as an outpatient for metastatic involvement. Since she was in the hospital the MRI was done here. She has no signs of dysphagia or dysarthria or localized weakness. She did speak to when she wants to. Discussed with neurology on-call no further management was recommended at this time. Patient had an echocardiogram ejection fraction 60 to 65%. Small echodensity attached to the tricuspid valve measuring 7 x 4 mm if clinically indicated consider TEE for further evaluation.  We will hold off on further work-up with TEE.  4]AKI with CKD stage III by renal mild improvements noted. Patient not a hemodialysis candidate anyhow.    5]pancytopenia secondary to chemo  6]non-small cell lung CA status post recent chemo will discuss with Dr. Julien Nordmann patient is not a candidate for chemotherapy at this time.    Appreciate palliative care input.  Discussed with daughter-in-law [name] [phone].PATIENT TO BE FOLLOWED BY PALLIATIVE CARE AT THE SNF.     Discharge Instructions  Discharge Instructions    Call MD for:  difficulty breathing, headache or visual disturbances   Complete by:  As directed    Call MD for:  persistant dizziness or light-headedness  Complete by:  As directed    Call MD for:  persistant nausea and vomiting   Complete by:  As directed    Call MD  for:  severe uncontrolled pain   Complete by:  As directed    Call MD for:  temperature >100.4   Complete by:  As directed    Diet - low sodium heart healthy   Complete by:  As directed    Increase activity slowly   Complete by:  As directed      Allergies as of 12/01/2017      Reactions   Codeine Other (See Comments)   REACTION: Pt c/o "feeling high"   Morphine Other (See Comments)   REACTION: feels funny and "high"      Medication List    STOP taking these medications   AEROCHAMBER MV inhaler   atorvastatin 40 MG tablet Commonly known as:  LIPITOR   hydrocortisone 2.5 % rectal cream Commonly known as:  ANUSOL-HC   hydrocortisone 25 MG suppository Commonly known as:  ANUSOL-HC   prochlorperazine 10 MG tablet Commonly known as:  COMPAZINE     TAKE these medications   albuterol 108 (90 Base) MCG/ACT inhaler Commonly known as:  PROVENTIL HFA;VENTOLIN HFA Inhale 2 puffs into the lungs every 6 (six) hours as needed for wheezing or shortness of breath.   ALPRAZolam 0.25 MG tablet Commonly known as:  XANAX Take 1 tablet (0.25 mg total) by mouth 2 (two) times daily as needed for anxiety.   clopidogrel 75 MG tablet Commonly known as:  PLAVIX Take 1 tablet (75 mg total) by mouth daily with breakfast. Okay to restart this medication on Sunday 10/18/2017.   HYDROcodone-acetaminophen 5-325 MG tablet Commonly known as:  NORCO/VICODIN Take 1 tablet by mouth every 4 (four) hours as needed for moderate pain. What changed:  reasons to take this   polyethylene glycol packet Commonly known as:  MIRALAX Take 17 g by mouth daily.      Follow-up Information    Sid Falcon, MD Follow up.   Specialty:  Internal Medicine Contact information: [place] [phone]          Allergies  Allergen Reactions  . Codeine Other (See Comments)    REACTION: Pt c/o "feeling high"  . Morphine Other (See Comments)    REACTION: feels funny and "high"     Consultations:     Procedures/Studies: Ct Abdomen Pelvis Wo Contrast  Result Date: 11/24/2017 CLINICAL DATA:  Diffuse abdominal pain for a day, diarrhea. History of lung cancer, cholecystectomy. EXAM: CT ABDOMEN AND PELVIS WITHOUT CONTRAST TECHNIQUE: Multidetector CT imaging of the abdomen and pelvis was performed following the standard protocol without IV contrast. COMPARISON:  CT abdomen and pelvis November 19, 2017 FINDINGS: LOWER CHEST: Similar small RIGHT pleural effusions. Numerous pulmonary nodules with subpleural reticulation. Heart size is normal. Severe coronary artery calcifications. Similar pericardial wall thickening. HEPATOBILIARY: Status post cholecystectomy. Negative non-contrast CT liver. PANCREAS: Normal. SPLEEN: Normal. ADRENALS/URINARY TRACT: Kidneys are orthotopic, demonstrating normal size. RIGHT renal scarring. Bilateral benign-appearing renal cysts measuring to 6.1 cm on the LEFT. No nephrolithiasis, hydronephrosis; limited assessment for renal masses by nonenhanced CT. The unopacified ureters are normal in course and caliber. Urinary bladder is adequately distended, mild prolapse. Normal adrenal glands. STOMACH/BOWEL: The stomach, small and large bowel are normal in course and caliber without inflammatory changes, sensitivity decreased by lack of enteric contrast. Minimal small bowel diverticula. No significant retained large bowel stool.  The appendix is not discretely identified, however there are no inflammatory changes in the right lower quadrant. VASCULAR/LYMPHATIC: Aortoiliac vessels are normal in course and caliber. Severe calcific atherosclerosis. Subcentimeter similar lymph nodes are cardio phrenic angle, potentially metastatic. REPRODUCTIVE: Status post hysterectomy. OTHER: No intraperitoneal free fluid or free air. MUSCULOSKELETAL: Non-acute. Old mild T12, moderate L3 compression fractures. Osteopenia. Severe upper lumbar facet arthropathy. IMPRESSION: 1. No acute  intra-abdominal or pelvic process by noncontrast CT. 2. Similar small RIGHT pleural effusion. 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 Aortic Atherosclerosis (ICD10-I70.0). Electronically Signed   By: Elon Alas M.D.   On: 11/24/2017 23:02   Dg Chest 1 View  Result Date: 11/27/2017 CLINICAL DATA:  Cough.  Follow-up pneumonia. EXAM: CHEST  1 VIEW COMPARISON:  11/26/2017. FINDINGS: Right upper lobe persistent infiltrate with associated right pleural thickening noted. Bibasilar atelectasis and/or infiltrates. Similar findings noted on prior exam. Chest is unchanged. Heart size normal. Thoracic spine scoliosis and degenerative change. IMPRESSION: Chest is unchanged with persistent right upper lobe infiltrate with associated right apical pleural thickening. Persistent bibasilar atelectasis and infiltrates also noted. Again no interim change from prior exam. Electronically Signed   By: Marcello Moores  Register   On: 11/27/2017 12:04   Dg Chest 1 View  Result Date: 11/26/2017 CLINICAL DATA:  Evaluate hypoxia. EXAM: CHEST  1 VIEW COMPARISON:  11/25/2017. FINDINGS: Worsening aeration throughout the RIGHT hemithorax, with increasing upper lobe consolidation as the predominant abnormality. RIGHT lower lobe opacity is probably stable. There is a moderate RIGHT pleural effusion which appears increased. LEFT lung changes appear stable. Normal heart size. Calcified tortuous aorta. Osteopenia. IMPRESSION: Slight worsening aeration. Electronically Signed   By: Staci Righter M.D.   On: 11/26/2017 16:25   Dg Chest 1 View  Result Date: 11/25/2017 CLINICAL DATA:  77 year old female with a history of wheezing and hypertension EXAM: CHEST  1 VIEW COMPARISON:  11/24/2017, chest CT 11/15/2017 FINDINGS: Cardiomediastinal silhouette likely unchanged with the borders partially obscured by overlying lung and pleural disease. Worsening opacity at the right lung base with mixed  interstitial and airspace opacity obscuring the right hemidiaphragm. Blunting of the right costophrenic angle. Worsening opacity at the left lung base with partial obscuration left hemidiaphragm and left heart border. Similar appearance of opacity at the right upper lobe, with pleuroparenchymal thickening, extending to the hilum. Calcifications of the aortic arch. IMPRESSION: Worsening opacities of the right greater than left lung base, potentially combination of worsening edema, pleural fluid, atelectasis/consolidation. Aspiration may be considered. Redemonstration of opacity of the right upper lobe extending from the hilum to the apex in this patient with known cancer. Electronically Signed   By: Corrie Mckusick D.O.   On: 11/25/2017 17:53   Dg Chest 2 View  Result Date: 11/24/2017 CLINICAL DATA:  Fever.  Lung cancer. EXAM: CHEST - 2 VIEW COMPARISON:  11/21/2017 FINDINGS: Right upper lobe density unchanged consistent with mass and infiltrate. Mild right lower lobe airspace disease also unchanged. Negative for heart failure. Left lung remains clear IMPRESSION: No significant change right upper lobe infiltrate and mass and mild right lower lobe airspace disease. Electronically Signed   By: Franchot Gallo M.D.   On: 11/24/2017 21:11   Dg Chest 2 View  Result Date: 11/21/2017 CLINICAL DATA:  77 year old female with chest pain. History of right lung squamous cell carcinoma. EXAM: CHEST - 2 VIEW COMPARISON:  Chest radiograph dated 11/18/2017 and CT dated 11/15/2017 and 10/12/2017 FINDINGS: Right upper lobe opacity similar to prior  CTs consistent with patient's known malignancy. Superimposed pneumonia is not excluded. Clinical correlation is recommended. The left lung is clear. There is diffuse interstitial coarsening and background of emphysema. Probable small right pleural effusion. No pneumothorax. The cardiac silhouette is within normal limits. Atherosclerotic calcification of the aortic arch. Osteopenia with  degenerative changes of the spine. No acute osseous pathology. IMPRESSION: Right upper lobe opacity similar to prior studies and consistent with known malignancy. Overall no interval change since the prior radiograph. No new consolidative changes. Electronically Signed   By: Anner Crete M.D.   On: 11/21/2017 04:33   Dg Chest 2 View  Result Date: 11/15/2017 CLINICAL DATA:  Initial evaluation for acute chest pain. EXAM: CHEST - 2 VIEW COMPARISON:  Prior radiograph from 11/02/2017 FINDINGS: Transverse heart size stable, and remains within normal limits. Increased density along the right paratracheal stripe with prominence of the right hilum, suggesting adenopathy. Aortic atherosclerosis. Overall lung volumes are reduced. Chronic fibrotic lung changes relatively similar to previous. Consolidative opacity at the right upper lobe, relatively unchanged from previous. No new other focal airspace disease. No pulmonary edema or pleural effusion. No pneumothorax. No acute osseus abnormality. IMPRESSION: 1. Overall no significant interval change in consolidative opacity at the right upper lobe with associated mediastinal and hilar adenopathy, again concerning for malignancy. Finding better assessed on recent CT from 10/12/2017. 2. Underlying chronic fibrotic lung changes, stable. Electronically Signed   By: Jeannine Boga M.D.   On: 11/15/2017 05:29   Dg Chest 2 View  Result Date: 11/02/2017 CLINICAL DATA:  77 year old female with chest pain shortness of breath since last night. COPD, hypertension and history of smoking. Subsequent encounter. EXAM: CHEST - 2 VIEW COMPARISON:  10/12/2017 chest CT. 10/11/2017 chest x-ray. 09/29/2017 PET-CT. FINDINGS: Mild progression of right upper lobe consolidation suspicious for tumor as previously discussed. Chronic lung changes suggestive of UIP. Heart size within normal limits. Calcified aorta. No acute osseous abnormality.  Scoliosis. IMPRESSION: Mild progression of right  upper lobe consolidation suspicious for tumor as previously discussed. Chronic lung changes suggestive of UIP. Aortic Atherosclerosis (ICD10-I70.0). Electronically Signed   By: Genia Del M.D.   On: 11/02/2017 07:59   Ct Angio Chest Pe W/cm &/or Wo Cm  Result Date: 11/15/2017 CLINICAL DATA:  Chest pain. Right lung squamous cell carcinoma diagnosed 10/16/2017. EXAM: CT ANGIOGRAPHY CHEST WITH CONTRAST TECHNIQUE: Multidetector CT imaging of the chest was performed using the standard protocol during bolus administration of intravenous contrast. Multiplanar CT image reconstructions and MIPs were obtained to evaluate the vascular anatomy. CONTRAST:  69mL ISOVUE-370 IOPAMIDOL (ISOVUE-370) INJECTION 76% COMPARISON:  10/12/2017 chest CT. 09/29/2017 PET-CT. 11/15/2017 chest radiograph. FINDINGS: Cardiovascular: The study is high quality for the evaluation of pulmonary embolism. There are no filling defects in the central, lobar, segmental or subsegmental pulmonary artery branches suggestive of acute pulmonary embolism. There are segmental and subsegmental indistinct filling defects in the right upper lobe pulmonary artery branches (series 7/image 133), favored to be due to a combination of mixing artifact from slow flow and potentially tumor invasion. Atherosclerotic nonaneurysmal thoracic aorta. Stable top-normal caliber main pulmonary artery (3.1 cm diameter). Normal heart size. Stable trace pericardial effusion/thickening. Three-vessel coronary atherosclerosis. Mediastinum/Nodes: Stable hypodense 1.1 cm right thyroid lobe nodule. Unremarkable esophagus. No axillary adenopathy. Newly enlarged 1.1 cm right supraclavicular node (series 5/image 4). Dominant enlarged 2.3 cm right paratracheal node (series 5/image 47), slightly increased from 2.2 cm. Dominant enlarged 2.3 cm right hilar node (series 5/image 50), not appreciably changed. No left  hilar adenopathy. Lungs/Pleura: No pneumothorax. Trace dependent right pleural  effusion. No left pleural effusion. Mild centrilobular and paraseptal emphysema with diffuse bronchial wall thickening. Right upper lobe 7.8 x 6.8 cm lung mass, mildly increased from 7.5 x 6.6 cm. Multiple (at least 8) solid pulmonary nodules scattered in both lungs, most prominent in the lower lobes, all new/increased. Representative 1.0 cm right lower lobe nodule (series 6/image 85), increased from 0.3 cm. Representative 1.1 cm posterior left lower lobe nodule (series 6/image 81), increased from 0.4 cm. Patchy subpleural reticulation and ground-glass attenuation throughout both lungs is not appreciably changed. Upper abdomen: No acute abnormality. Musculoskeletal: No aggressive appearing focal osseous lesions. Mild thoracic spondylosis. Review of the MIP images confirms the above findings. IMPRESSION: 1. No convincing findings of pulmonary embolism. Indistinct segmental/subsegmental filling defects in the right upper lobe pulmonary artery branches, favored to be due to a combination of mixing artifact from slow flow and potentially tumor invasion. 2. Mild interval growth of 7.8 cm right upper lobe lung mass compatible with known malignancy. 3. New mild right supraclavicular nodal metastasis. Slight growth of right paratracheal nodal metastases. Stable right hilar nodal metastases. 4. Progression of bilateral pulmonary metastases. 5. Trace dependent right pleural effusion. Aortic Atherosclerosis (ICD10-I70.0) and Emphysema (ICD10-J43.9). Electronically Signed   By: Ilona Sorrel M.D.   On: 11/15/2017 10:23   Mr Brain Wo Contrast  Result Date: 11/28/2017 CLINICAL DATA:  Lung CA staging. COPD. Hypertension. Acute on chronic kidney injury. Confusion. EXAM: MRI HEAD WITHOUT CONTRAST TECHNIQUE: Multiplanar, multiecho pulse sequences of the brain and surrounding structures were obtained without intravenous contrast. COMPARISON:  None. FINDINGS: Brain: Subcentimeter focus of restricted diffusion, corresponding low ADC,  RIGHT cerebellar cortex posteriorly, see series 3, image 7, series 7, image 8. No other similar abnormalities. This could represent a small acute infarct, or a hypercellular metastasis. Generalized atrophy. Extensive T2 and FLAIR hyperintensities in the white matter, likely small vessel disease. There is a tiny chronic infarct in the RIGHT inferior cerebellum, more laterally. Vascular: Flow voids are maintained.  No chronic hemorrhage. Skull and upper cervical spine: Subcentimeter T1 and T2 hyperintense lesion in the LEFT paramedian occipital bone, near the internal occipital protuberance, could represent an incidental hemangioma. No definite calvarial or skull base metastases within limits for detection due to motion. Sinuses/Orbits: Negative. Other: None. IMPRESSION: Noncontrast examination due to elevated creatinine. Subcentimeter focus of restricted diffusion RIGHT posterior cerebellum. This could represent a small acute infarct or a hypercellular metastasis. Given the patient's cell type is non small cell lung cancer, and that there is a similar but smaller chronic infarct in the RIGHT inferior cerebellar hemisphere, an acute infarct is favored. Electronically Signed   By: Staci Righter M.D.   On: 11/28/2017 20:38   Ct Abdomen Pelvis W Contrast  Result Date: 11/19/2017 CLINICAL DATA:  Diffuse abdominal pain.  History of lung cancer. EXAM: CT ABDOMEN AND PELVIS WITH CONTRAST TECHNIQUE: Multidetector CT imaging of the abdomen and pelvis was performed using the standard protocol following bolus administration of intravenous contrast. CONTRAST:  15mL OMNIPAQUE IOHEXOL 300 MG/ML  SOLN COMPARISON:  Abdominal x-rays from yesterday. CT abdomen pelvis dated October 31, 2017. FINDINGS: Lower chest: Multiple enlarging nodules at the lung bases with increased perilymphatic nodularity, particularly in the right lung. Unchanged small right pleural effusion. Hepatobiliary: No focal liver abnormality is seen. Status post  cholecystectomy. No biliary dilatation. Pancreas: Unremarkable. No pancreatic ductal dilatation or surrounding inflammatory changes. Spleen: Normal in size without focal abnormality. Adrenals/Urinary Tract:  The adrenal glands are unremarkable. Stable bilateral renal cysts. No renal or ureteral calculi. No hydronephrosis. The bladder is unremarkable. Unchanged urethral diverticulum. Stomach/Bowel: Stomach is within normal limits. Appendix appears normal. No evidence of bowel wall thickening, distention, or inflammatory changes. Large stool ball in the rectum. Moderate colonic stool burden. Vascular/Lymphatic: Aortic atherosclerosis. No enlarged abdominal or pelvic lymph nodes. Reproductive: Uterus and bilateral adnexa are unremarkable for the patient's age. Other: No free fluid or pneumoperitoneum. Musculoskeletal: No acute or significant osseous findings. IMPRESSION: 1.  No acute intra-abdominal process. 2. Prominent stool throughout the colon favors constipation. Large stool ball in the rectum. Correlate for fecal impaction. 3. 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 4. Unchanged small right pleural effusion. 5. Unchanged urethral diverticulum. 6.  Aortic atherosclerosis (ICD10-I70.0). Electronically Signed   By: Titus Dubin M.D.   On: 11/19/2017 15:14   Dg Abdomen Acute W/chest  Result Date: 11/18/2017 CLINICAL DATA:  Constipation times 14 days. Black tarry stool and history of hemorrhoids. Known pulmonary mass. EXAM: DG ABDOMEN ACUTE W/ 1V CHEST COMPARISON:  Chest CT 11/15/2017, abdomen radiographs from 09/11/2017 and CT from 11/01/2017 FINDINGS: AP semi upright view of the chest as AP and right side up decubitus views of the abdomen are provided. Cardiomegaly with aortic atherosclerosis is noted. Confluent opacities in the right upper lobe from the patient's  known pulmonary malignancy is identified with probable areas of pulmonary atelectasis and consolidation superimposed. There is no free air beneath the diaphragm. The bowel gas pattern is unremarkable with a nonobstructed, nondistended bowel gas pattern. No free air is identified. Surgical clips are seen in the right upper quadrant. Stool ball is seen in the rectum. IMPRESSION: 1. Pulmonary opacity in the right upper lobe consistent with the patient's known pulmonary malignancy. Cardiomegaly with aortic atherosclerosis. 2. Large stool ball in the rectum consistent with constipation/fecal impaction. Electronically Signed   By: Ashley Royalty M.D.   On: 11/18/2017 17:00    (Echo, Carotid, EGD, Colonoscopy, ERCP)    Subjective:   Discharge Exam: Vitals:   12/01/17 0737 12/01/17 0800  BP:    Pulse:    Resp:    Temp:    SpO2: (!) 8% 90%   Vitals:   11/30/17 2141 12/01/17 0535 12/01/17 0737 12/01/17 0800  BP:  132/60    Pulse:  (!) 106    Resp:  16    Temp:  98.5 F (36.9 C)    TempSrc:      SpO2: 95% 93% (!) 8% 90%  Weight:      Height:        General: Pt is alert, awake, not in acute distress Cardiovascular: RRR, S1/S2 +, no rubs, no gallops Respiratory: CTA bilaterally, no wheezing, no rhonchi Abdominal: Soft, NT, ND, bowel sounds + Extremities: no edema, no cyanosis    The results of significant diagnostics from this hospitalization (including imaging, microbiology, ancillary and laboratory) are listed below for reference.     Microbiology: Recent Results (from the past 240 hour(s))  Blood culture (routine x 2)     Status: None   Collection Time: 11/24/17  8:33 PM  Result Value Ref Range Status   Specimen Description   Final    BLOOD RIGHT WRIST Performed at [facility], [place] [address], [place], [place] [address]    Special Requests   Final    BOTTLES DRAWN AEROBIC AND  ANAEROBIC Blood Culture adequate volume Performed at [facility] [address], [place], [place] [address]    Culture   Final    NO GROWTH 5 DAYS Performed at [facility], [place] [address], [place], [place] [address]    Report Status 11/29/2017 FINAL  Final  Blood culture (routine x 2)     Status: None   Collection Time: 11/24/17  8:33 PM  Result Value Ref Range Status   Specimen Description   Final    BLOOD RIGHT ANTECUBITAL Performed at [facility] [address] [place], [place] [address]    Special Requests   Final    BOTTLES DRAWN AEROBIC AND ANAEROBIC Blood Culture adequate volume Performed at [facility] [address] [place], [place] [address]    Culture   Final    NO GROWTH 5 DAYS Performed at [facility], [place] [address], [place], [place] [address]    Report Status 11/30/2017 FINAL  Final  Urine culture     Status: Abnormal   Collection Time: 11/24/17 11:36 PM  Result Value Ref Range Status   Specimen Description   Final    URINE, CLEAN CATCH Performed at [facility], [place] [address] [place], [place] [address]    Special Requests   Final    NONE Performed at [facility], [place] [address] [place], [place] [address]    Culture (A)  Final    >=100,000 COLONIES/mL LACTOBACILLUS SPECIES Standardized susceptibility testing for this organism is not available. Performed at [facility], [place] [address] [place], [place] [address]    Report Status 11/26/2017 FINAL  Final  MRSA PCR Screening     Status: None   Collection Time: 11/25/17  6:18 PM  Result Value Ref Range Status   MRSA by PCR NEGATIVE NEGATIVE Final    Comment:        The GeneXpert MRSA Assay (FDA approved for NASAL specimens only), is one component of a comprehensive MRSA colonization surveillance program. It is not intended to diagnose MRSA infection nor to guide or monitor treatment for MRSA infections. Performed at [facility],  [place] [address], [place], [place] [address]   C difficile quick scan w PCR reflex     Status: None   Collection Time: 11/26/17  4:06 AM  Result Value Ref Range Status   C Diff antigen NEGATIVE NEGATIVE Final   C Diff toxin NEGATIVE NEGATIVE Final   C Diff interpretation No C. difficile detected.  Final    Comment: Performed at [facility], [place] [address], [place],  [address]  Gastrointestinal Panel by PCR , Stool     Status: Abnormal   Collection Time: 11/26/17  4:06 AM  Result Value Ref Range Status   Campylobacter species NOT DETECTED NOT DETECTED Final   Plesimonas shigelloides NOT DETECTED NOT DETECTED Final   Salmonella species NOT DETECTED NOT DETECTED Final   Yersinia enterocolitica NOT DETECTED NOT DETECTED Final   Vibrio species NOT DETECTED NOT DETECTED Final   Vibrio cholerae NOT DETECTED NOT DETECTED Final   Enteroaggregative E coli (EAEC) NOT DETECTED NOT DETECTED Final   Enteropathogenic E coli (EPEC) NOT DETECTED NOT DETECTED Final   Enterotoxigenic E coli (ETEC) DETECTED (A) NOT DETECTED Final    Comment: RESULT CALLED TO, READ BACK BY AND VERIFIED WITH: SARAH BETH EARLY @ 1836 ON 11/26/2017 BY CAF  Shiga like toxin producing E coli (STEC) NOT DETECTED NOT DETECTED Final   Shigella/Enteroinvasive E coli (EIEC) NOT DETECTED NOT DETECTED Final   Cryptosporidium NOT DETECTED NOT DETECTED Final   Cyclospora cayetanensis NOT DETECTED NOT DETECTED Final   Entamoeba histolytica NOT DETECTED NOT DETECTED Final   Giardia lamblia NOT DETECTED NOT DETECTED Final   Adenovirus F40/41 NOT DETECTED NOT DETECTED Final   Astrovirus NOT DETECTED NOT DETECTED Final   Norovirus GI/GII NOT DETECTED NOT DETECTED Final   Rotavirus A NOT DETECTED NOT DETECTED Final   Sapovirus (I, II, IV, and V) NOT DETECTED NOT DETECTED Final    Comment: Performed at [facility], [place]., [place], [place] [address]     Labs: BNP (last 3 results) No  results for input(s): BNP in the last 8760 hours. Basic Metabolic Panel: Recent Labs  Lab 11/26/17 0749 11/27/17 0730 11/28/17 0325 11/29/17 0315 11/30/17 0400  NA 138 138 141 143 144  K 4.4 4.4 4.5 3.6 4.0  CL 113* 111 111 113* 114*  CO2 16* 18* 21* 20* 20*  GLUCOSE 115* 108* 110* 70 86  BUN 34* 36* 38* 43* 47*  CREATININE 2.15* 2.29* 2.41* 2.39* 2.29*  CALCIUM 8.6* 8.7* 8.9 8.8* 8.7*   Liver Function Tests: Recent Labs  Lab 11/24/17 2033 11/25/17 0610 11/25/17 1112  AST 47* 45* 40  ALT 42 45* 42  ALKPHOS 81 71 64  BILITOT 0.3 0.7 0.7  PROT 5.6* 5.6* 5.4*  ALBUMIN 2.1* 2.1* 2.0*   Recent Labs  Lab 11/24/17 2033  LIPASE 20   No results for input(s): AMMONIA in the last 168 hours. CBC: Recent Labs  Lab 11/24/17 2033 11/25/17 0610 11/25/17 1112 11/26/17 0726  WBC 2.7* 4.3 4.0  --   NEUTROABS 1.6* 3.1  --   --   HGB 9.8* 8.3* 7.6* 9.2*  HCT 31.6* 26.8* 24.9* 29.3*  MCV 91.3 92.7 94.0  --   PLT 119* 136* 134*  --    Cardiac Enzymes: No results for input(s): CKTOTAL, CKMB, CKMBINDEX, TROPONINI in the last 168 hours. BNP: Invalid input(s): POCBNP CBG: No results for input(s): GLUCAP in the last 168 hours. D-Dimer No results for input(s): DDIMER in the last 72 hours. Hgb A1c No results for input(s): HGBA1C in the last 72 hours. Lipid Profile No results for input(s): CHOL, HDL, LDLCALC, TRIG, CHOLHDL, LDLDIRECT in the last 72 hours. Thyroid function studies No results for input(s): TSH, T4TOTAL, T3FREE, THYROIDAB in the last 72 hours.  Invalid input(s): FREET3 Anemia work up No results for input(s): VITAMINB12, FOLATE, FERRITIN, TIBC, IRON, RETICCTPCT in the last 72 hours. Urinalysis    Component Value Date/Time   COLORURINE AMBER (A) 11/24/2017 2314   APPEARANCEUR CLOUDY (A) 11/24/2017 2314   LABSPEC 1.018 11/24/2017 2314   PHURINE 5.0 11/24/2017 2314   GLUCOSEU NEGATIVE 11/24/2017 2314   HGBUR SMALL (A) 11/24/2017 2314   BILIRUBINUR NEGATIVE  11/24/2017 2314   BILIRUBINUR NEG 10/10/2010 1050   KETONESUR NEGATIVE 11/24/2017 2314   PROTEINUR 30 (A) 11/24/2017 2314   UROBILINOGEN 0.2 03/06/2015 0938   NITRITE NEGATIVE 11/24/2017 2314   LEUKOCYTESUR LARGE (A) 11/24/2017 2314   Sepsis Labs Invalid input(s): PROCALCITONIN,  WBC,  LACTICIDVEN Microbiology Recent Results (from the past 240 hour(s))  Blood culture (routine x 2)     Status: None   Collection Time: 11/24/17  8:33 PM  Result Value Ref Range Status   Specimen Description   Final    BLOOD RIGHT  WRIST Performed at [facility], [place] [address] [place], [place] [address]    Special Requests   Final    BOTTLES DRAWN AEROBIC AND ANAEROBIC Blood Culture adequate volume Performed at [place] [address], [place], [place] [address]    Culture   Final    NO GROWTH 5 DAYS Performed at [facility], [place] [address] [place], [place] [address]    Report Status 11/29/2017 FINAL  Final  Blood culture (routine x 2)     Status: None   Collection Time: 11/24/17  8:33 PM  Result Value Ref Range Status   Specimen Description   Final    BLOOD RIGHT ANTECUBITAL Performed at [place] [address], [place], [place] [address]    Special Requests   Final    BOTTLES DRAWN AEROBIC AND ANAEROBIC Blood Culture adequate volume Performed at [place] [address] [place], [place] [address]    Culture   Final    NO GROWTH 5 DAYS Performed at [facility], [place] [address], [place], [place] [address]    Report Status 11/30/2017 FINAL  Final  Urine culture     Status: Abnormal   Collection Time: 11/24/17 11:36 PM  Result Value Ref Range Status   Specimen Description   Final    URINE, CLEAN CATCH Performed at [facility], [place] [address] [place], [place] [address]    Special Requests   Final    NONE Performed at [facility], [place]  [address], [place], [place] [address]    Culture (A)  Final    >=100,000 COLONIES/mL LACTOBACILLUS SPECIES Standardized susceptibility testing for this organism is not available. Performed at [facility], [place] [address] [place], [place] [address]    Report Status 11/26/2017 FINAL  Final  MRSA PCR Screening     Status: None   Collection Time: 11/25/17  6:18 PM  Result Value Ref Range Status   MRSA by PCR NEGATIVE NEGATIVE Final    Comment:        The GeneXpert MRSA Assay (FDA approved for NASAL specimens only), is one component of a comprehensive MRSA colonization surveillance program. It is not intended to diagnose MRSA infection nor to guide or monitor treatment for MRSA infections. Performed at [facility], [place] [address], [place], [place] [address]   C difficile quick scan w PCR reflex     Status: None   Collection Time: 11/26/17  4:06 AM  Result Value Ref Range Status   C Diff antigen NEGATIVE NEGATIVE Final   C Diff toxin NEGATIVE NEGATIVE Final   C Diff interpretation No C. difficile detected.  Final    Comment: Performed at [facility], [place], [place] [address], [place], [place] [address]  Gastrointestinal Panel by PCR , Stool     Status: Abnormal   Collection Time: 11/26/17  4:06 AM  Result Value Ref Range Status   Campylobacter species NOT DETECTED NOT DETECTED Final   Plesimonas shigelloides NOT DETECTED NOT DETECTED Final   Salmonella species NOT DETECTED NOT DETECTED Final   Yersinia enterocolitica NOT DETECTED NOT DETECTED Final   Vibrio species NOT DETECTED NOT DETECTED Final   Vibrio cholerae NOT DETECTED NOT DETECTED Final   Enteroaggregative E coli (EAEC) NOT DETECTED NOT DETECTED Final   Enteropathogenic E coli (EPEC) NOT DETECTED NOT DETECTED Final   Enterotoxigenic E coli (ETEC) DETECTED (A)  NOT DETECTED Final    Comment: RESULT CALLED TO, READ BACK BY AND VERIFIED WITH: SARAH BETH EARLY @ 1836 ON 11/26/2017 BY CAF     Shiga like toxin producing E coli (STEC) NOT DETECTED NOT DETECTED Final   Shigella/Enteroinvasive E coli (EIEC) NOT DETECTED NOT DETECTED Final   Cryptosporidium NOT DETECTED NOT DETECTED Final   Cyclospora cayetanensis NOT DETECTED NOT DETECTED Final   Entamoeba histolytica NOT DETECTED NOT DETECTED Final   Giardia lamblia NOT DETECTED NOT DETECTED Final   Adenovirus F40/41 NOT DETECTED NOT DETECTED Final   Astrovirus NOT DETECTED NOT DETECTED Final   Norovirus GI/GII NOT DETECTED NOT DETECTED Final   Rotavirus A NOT DETECTED NOT DETECTED Final   Sapovirus (I, II, IV, and V) NOT DETECTED NOT DETECTED Final    Comment: Performed at [facility], [address] [place], [place] [address]     Time coordinating discharge: 36 minutes  SIGNED:   Georgette Shell, MD  [facility] 12/01/2017, 10:55 AM Pager   If 7PM-7AM, please contact night-coverage [email or web address] Password [email or web address]

## 2017-12-01 NOTE — Progress Notes (Signed)
Daily Progress Note   Patient Name: [name]       Date: 12/01/2017 DOB: 1940/08/02  Age: 77 y.o. MRN#: [id number] Attending Physician: Dana Shell, MD Primary Care Physician: Dana Falcon, MD Admit Date: 11/24/2017  Reason for Consultation/Follow-up: Establishing goals of care  Subjective: Patient awake and alert. C/o of pain on the right side of her chest/abdomen. Requesting pain medication. No family present.   She tells me she does not want to take morphine because she does not like the way it makes her feel. [name] was taking hydrocodone at home with relief. Agreeable to take hydrocodone, understanding this may help with her SOB too.   Attempted to discuss whether she has thought about her wishes regarding heroic interventions. Challenging to discuss this topic without family at bedside. 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 It is important for family to attempt further [name] discussion with patient regarding her wishes. Patient likely will be discharged to SNF today.   ADDENDUM 1535: Updated DIL, [name] via telephone. She is agreeable with plan of care including medications as needed for pain, SOB, and anxiety. Encouraged DIL to f/u at facility regarding outpatient palliative referral.    Length of Stay: 6  Current Medications: Scheduled Meds:  . sodium chloride   Intravenous Once  . atorvastatin  40 mg Oral Daily  . clopidogrel  75 mg Oral Q breakfast  . ipratropium-albuterol  3 mL Nebulization TID    Continuous Infusions: . sodium chloride 75 mL/hr at 12/01/17 1031    PRN Meds: acetaminophen **OR** acetaminophen, albuterol, ALPRAZolam, alum & mag hydroxide-simeth, HYDROcodone-acetaminophen, ondansetron **OR** ondansetron (ZOFRAN)  IV  Physical Exam  Constitutional: She is easily aroused. She appears ill.  HENT:  Head: Normocephalic and atraumatic.  Pulmonary/Chest: No accessory muscle usage. No tachypnea. No respiratory distress.  Intermittent dyspnea at rest  Neurological: She is alert and easily aroused.  Oriented to person/place. Legally blind. HOH  Skin: Skin is warm and dry.  Nursing note and vitals reviewed.          Vital Signs: BP (!) 102/48 (BP Location: Right Arm)   Pulse (!) 108   Temp (!) 97.3 F (36.3 C) (Oral)   Resp 16   Ht 5\' 4"  (1.626 m)   Wt 60.5 kg (133 lb  6.1 oz)   LMP  (LMP Unknown)   SpO2 96%   BMI 22.89 kg/m  SpO2: SpO2: 96 % O2 Device: O2 Device: Room Air O2 Flow Rate: O2 Flow Rate (L/min): 3 L/min  Intake/output summary:   Intake/Output Summary (Last 24 hours) at 12/01/2017 1510 Last data filed at 12/01/2017 1400 Gross per 24 hour  Intake 2560 ml  Output 300 ml  Net 2260 ml   LBM: Last BM Date: 11/30/17 Baseline Weight: Weight: 57.6 kg (127 lb) Most recent weight: Weight: 60.5 kg (133 lb 6.1 oz)       Palliative Assessment/Data: PPS 40%   Flowsheet Rows     Most Recent Value  Intake Tab  Referral Department  Hospitalist  Unit at Time of Referral  Med/Surg Unit  Palliative Care Primary Diagnosis  Cancer  Palliative Care Type  New Palliative care  Reason for referral  Clarify Goals of Care  Date first seen by Palliative Care  11/30/17  Clinical Assessment  Palliative Performance Scale Score  40%  Psychosocial & Spiritual Assessment  Palliative Care Outcomes  Patient/Family meeting held?  Yes  Who was at the meeting?  patient, son, daughter-in-law  Palliative Care Outcomes  Clarified goals of care, Counseled regarding hospice, Provided psychosocial or spiritual support, ACP counseling assistance, Provided end of life care assistance, Improved pain interventions, Improved non-pain symptom therapy      Patient Active Problem List   Diagnosis Date Noted  .  Palliative care by specialist   . Acute respiratory distress   . Wheezing   . Hypoxia   . Sepsis ([place]) 11/25/2017  . ARF (acute renal failure) ([place]) 11/25/2017  . Acute lower UTI 11/25/2017  . Diarrhea 11/25/2017  . Pancytopenia ([place]) 11/25/2017  . Stage IV squamous cell carcinoma of right lung ([place]) 11/03/2017  . Goals of care, counseling/discussion 11/03/2017  . Encounter for antineoplastic chemotherapy 11/03/2017  . Mediastinal lymphadenopathy 10/16/2017  . Lung mass 10/16/2017  . Symptomatic anemia   . Malnutrition of moderate degree 10/12/2017  . Orthostatic hypotension 09/15/2017  . Mass of upper lobe of right lung   . Acute cystitis with hematuria   . Pneumonia 09/13/2017  . Acute-on-chronic kidney injury ([place]) 09/13/2017  . Ganglion cyst of finger 03/26/2017  . S/P laparoscopic cholecystectomy 01/14/2017  . PVD (peripheral vascular disease) s/p LLE PTA 03/2016, RLE PTA 04/2016   . Bilateral foot pain 02/20/2016  . Epidermoid cyst 11/01/2015  . Vitamin D deficiency 03/29/2014  . COPD (chronic obstructive pulmonary disease) ([place]) 02/21/2014  . Postmenopausal 06/16/2013  . Preventative health care 06/16/2013  . Constipation 11/04/2012  . Bilateral shoulder pain 09/01/2012  . CKD (chronic kidney disease) stage 3, GFR 30-59 ml/min (HCC) 11/28/2009  . Hyperlipidemia 07/02/2006  . Hearing loss 05/13/2006  . TOBACCO ABUSE 03/26/2006  . BLINDNESS 03/26/2006  . Essential hypertension 03/26/2006    Palliative Care Assessment & Plan   Patient Profile: 77 y.o. female  with past medical history of HTN, HLD, COPD, CKD, DVT, anemia, arthritis, legally blind admitted on 11/24/2017 with diarrhea, abdominal pain. In ED, patient with hypotension, tachycardia, febrile, and elevated lactate concerning for sepsis. UA concerning for UTI. Chest xray showed possible infiltrates. CT abdomen unremarkable. Starting on antibiotics for HCAP. MRI brain on 7/20 favoring acute right cerebellar  stroke. Patient newly diagnosed with non-small cell lung cancer s/p one round of chemotherapy. Also with AKI superimposed on CKD. Patient is not a candidate for dialysis. Per oncology, patient is not a candidate for  further chemotherapy. Palliative medicine consultation for goals of care.   Assessment: Acute respiratory distress Non-small cell lung cancer HCAP Acute right cerebellar stroke AKI superimposed on CKD Pancytopenia  Recommendations/Plan:  Patient does not like the way morphine makes her feel and does not want to take for pain/dyspnea. Restarted home dose of hydrocodone 1tab (5-325) q4h prn pain/dyspnea.   Xanax 0.25mg  PO BID prn anxiety  No family at bedside this morning. Good initial palliative discussion on 7/22. Will need ongoing palliative [place] discussions at SNF. Likely will discharge to SNF today. Updated DIL via telephone. Encouraged DIL to f/u with outpatient palliative referral at SNF. She understands this can transition to hospice services in the near future if she does not progress at rehab.     Code Status: Limited   Code Status Orders  (From admission, onward)        Start     Ordered   11/25/17 1720  Limited resuscitation (code)  Continuous    Question Answer Comment  In the event of cardiac or respiratory ARREST: Initiate Code Blue, Call Rapid Response Yes   In the event of cardiac or respiratory ARREST: Perform CPR Yes   In the event of cardiac or respiratory ARREST: Perform Intubation/Mechanical Ventilation No   In the event of cardiac or respiratory ARREST: Use NIPPV/BiPAp only if indicated Yes   In the event of cardiac or respiratory ARREST: Administer ACLS medications if indicated Yes   In the event of cardiac or respiratory ARREST: Perform Defibrillation or Cardioversion if indicated Yes      11/25/17 1720    Code Status History    Date Active Date Inactive Code Status Order ID Comments User Context   11/25/2017 0421 11/25/2017 1720 Full Code  [id number]  Rise Patience, MD ED   10/16/2017 1529 10/17/2017 1525 Full Code [id number]  Collene Gobble, MD Inpatient   10/11/2017 1830 10/13/2017 1631 Full Code [id number]  Molt, [place], DO ED   09/13/2017 2028 09/15/2017 2054 Full Code [id number]  Zada Finders, MD Inpatient   01/14/2017 1537 01/17/2017 1813 Full Code [id number]  Rosita Fire, MD ED   04/24/2016 1654 04/25/2016 1323 Full Code [id number]  Lorretta Harp, MD Inpatient   04/06/2016 1407 04/08/2016 1259 Full Code [id number]  Cheryln Manly, NP Inpatient   03/16/2016 1254 03/17/2016 1537 Full Code [id number]  Leanor Kail, [place] Inpatient   02/21/2014 0035 02/21/2014 1933 Full Code [id number]  Jones Bales, MD Inpatient       Prognosis:   Unable to determine: guarded with new diagnosis of non-small cell lung cancer and declining functional and nutritional status.  Discharge Planning:  [place] for rehab with Palliative care service follow-up  Care plan was discussed with patient, RN, DIL [name])  Thank you for allowing the Palliative Medicine Team to assist in the care of this patient.    Time In: 1000- 1525- Time Out: 3094 0768 Total Time 67min Prolonged Time Billed no      Greater than 50%  of this time was spent counseling and coordinating care related to the above assessment and plan.  Ihor Dow, FNP-C Palliative Medicine Team  Phone: [phone] Fax: [phone]  Please contact Palliative Medicine Team phone at [phone] for questions and concerns.

## 2017-12-01 NOTE — Care Management Important Message (Signed)
Important Message  Patient Details  Name: [name] MRN: [id number] Date of Birth: [birth year]   Medicare Important Message Given:  Yes    Kerin Salen 12/01/2017, 11:21 AMImportant Message  Patient Details  Name: [name] MRN: [id number] Date of Birth: [birth year]   Medicare Important Message Given:  Yes    Kerin Salen 12/01/2017, 11:21 AM

## 2017-12-01 NOTE — NC FL2 (Addendum)
[place] LEVEL OF CARE SCREENING TOOL     IDENTIFICATION  Patient Name: [name] Birthdate: 1940/10/11 Sex: female Admission Date (Current Location): 11/24/2017  [facility] and [place] Number:  Herbalist and Address:  [facility],  [place], [place]      Provider Number: [id number]  Attending Physician Name and Address:  Georgette Shell, MD  Relative Name and Phone Number:       Current Level of Care: Hospital Recommended Level of Care: [place] Prior Approval Number:    Date Approved/Denied: 12/01/17 PASRR Number: [id number]  Discharge Plan: SNF    Current Diagnoses: Patient Active Problem List   Diagnosis Date Noted  . Palliative care by specialist   . Acute respiratory distress   . Wheezing   . Hypoxia   . Sepsis ([place]) 11/25/2017  . ARF (acute renal failure) ([place]) 11/25/2017  . Acute lower UTI 11/25/2017  . Diarrhea 11/25/2017  . Pancytopenia ([place]) 11/25/2017  . Stage IV squamous cell carcinoma of right lung ([place]) 11/03/2017  . Goals of care, counseling/discussion 11/03/2017  . Encounter for antineoplastic chemotherapy 11/03/2017  . Mediastinal lymphadenopathy 10/16/2017  . Lung mass 10/16/2017  . Symptomatic anemia   . Malnutrition of moderate degree 10/12/2017  . Orthostatic hypotension 09/15/2017  . Mass of upper lobe of right lung   . Acute cystitis with hematuria   . Pneumonia 09/13/2017  . Acute-on-chronic kidney injury ([place]) 09/13/2017  . Ganglion cyst of finger 03/26/2017  . S/P laparoscopic cholecystectomy 01/14/2017  . PVD (peripheral vascular disease) s/p LLE PTA 03/2016, RLE PTA 04/2016   . Bilateral foot pain 02/20/2016  . Epidermoid cyst 11/01/2015  . Vitamin D deficiency 03/29/2014  . COPD (chronic obstructive pulmonary disease) ([place]) 02/21/2014  . Postmenopausal 06/16/2013  . Preventative health care 06/16/2013  . Constipation 11/04/2012  . Bilateral  shoulder pain 09/01/2012  . CKD (chronic kidney disease) stage 3, GFR 30-59 ml/min (HCC) 11/28/2009  . Hyperlipidemia 07/02/2006  . Hearing loss 05/13/2006  . TOBACCO ABUSE 03/26/2006  . BLINDNESS 03/26/2006  . Essential hypertension 03/26/2006    Orientation RESPIRATION BLADDER Height & Weight     Self  Normal Continent, External catheter Weight: 133 lb 6.1 oz (60.5 kg) Height:  5\' 4"  (162.6 cm)  BEHAVIORAL SYMPTOMS/MOOD NEUROLOGICAL BOWEL NUTRITION STATUS      Continent Diet(See dc summary)  AMBULATORY STATUS COMMUNICATION OF NEEDS Skin   Extensive Assist Verbally Normal                       Personal Care Assistance Level of Assistance  Bathing, Feeding, Dressing Bathing Assistance: Limited assistance Feeding assistance: Independent Dressing Assistance: Limited assistance     Functional Limitations Info  Sight, Hearing, Speech Sight Info: Impaired(Blind) Hearing Info: Impaired Speech Info: Adequate    SPECIAL CARE FACTORS FREQUENCY  PT (By licensed PT)     PT Frequency: 5x/week              Contractures Contractures Info: Not present    Additional Factors Info  Code Status, Allergies Code Status Info: Partial Allergies Info: Codiene, Morphine           Current Medications (12/01/2017):  This is the current hospital active medication list Current Facility-Administered Medications  Medication Dose Route Frequency Provider Last Rate Last Dose  . 0.9 %  sodium chloride infusion (Manually program via Guardrails IV Fluids)   Intravenous Once Georgette Shell,  MD      . 0.9 %  sodium chloride infusion   Intravenous Continuous Georgette Shell, MD 75 mL/hr at 12/01/17 1031    . acetaminophen (TYLENOL) tablet 650 mg  650 mg Oral Q6H PRN Rise Patience, MD   650 mg at 11/30/17 2257   Or  . acetaminophen (TYLENOL) suppository 650 mg  650 mg Rectal Q6H PRN Rise Patience, MD      . albuterol (PROVENTIL) (2.5 MG/3ML) 0.083% nebulizer  solution 2.5 mg  2.5 mg Nebulization Q2H PRN Georgette Shell, MD   2.5 mg at 11/30/17 1636  . ALPRAZolam Duanne Moron) tablet 0.25 mg  0.25 mg Oral BID PRN Basilio Cairo, NP   0.25 mg at 12/01/17 1029  . alum & mag hydroxide-simeth (MAALOX/MYLANTA) 200-200-20 MG/5ML suspension 30 mL  30 mL Oral Q4H PRN Georgette Shell, MD   30 mL at 11/26/17 0513  . atorvastatin (LIPITOR) tablet 40 mg  40 mg Oral Daily Rise Patience, MD   40 mg at 12/01/17 0926  . clopidogrel (PLAVIX) tablet 75 mg  75 mg Oral Q breakfast Rise Patience, MD   75 mg at 12/01/17 0810  . HYDROcodone-acetaminophen (NORCO/VICODIN) 5-325 MG per tablet 1 tablet  1 tablet Oral Q4H PRN Basilio Cairo, NP   1 tablet at 12/01/17 1029  . ipratropium-albuterol (DUONEB) 0.5-2.5 (3) MG/3ML nebulizer solution 3 mL  3 mL Nebulization TID Georgette Shell, MD   3 mL at 12/01/17 0736  . ondansetron (ZOFRAN) tablet 4 mg  4 mg Oral Q6H PRN Rise Patience, MD       Or  . ondansetron [facility]) injection 4 mg  4 mg Intravenous Q6H PRN Rise Patience, MD         Discharge Medications: Please see discharge summary for a list of discharge medications.  Relevant Imaging Results:  Relevant Lab Results:   Additional Information [id number] with palliative to follow  Servando Snare, LCSW

## 2017-12-01 NOTE — Progress Notes (Signed)
Family chose bed at [facility] facility can admit in the morning.   Carolin Coy [place]

## 2017-12-01 NOTE — Progress Notes (Signed)
Patient has discharged to SNF placement. Waiting for bed available. SW is notified.

## 2017-12-01 NOTE — Progress Notes (Signed)
Received report from [employer]. Assessment is unchanged from previous assessment. Will continue to monitor.

## 2017-12-01 NOTE — Consult Note (Signed)
   [facility] Inpatient Consult   12/01/2017  MAYRE BURY January 12, 1941 [id number]   [facility] Care Management hospital liaison follow up.   Chart reviewed. Noted palliative care consult. Appears discharge plan is for SNF with palliative follow up at SNF.   Confirmed with inpatient LCSW discharge plan is for SNF.  Went to bedside to speak with family about ongoing [place] Management services. However, family was not present.   Will update [facility] Community team of disposition plans.   Will make referral to [place] for follow up when patient discharges to [facility] SNF.   Marthenia Rolling, MSN-Ed, RN,BSN [facility] Liaison [phone]

## 2017-12-01 NOTE — Progress Notes (Signed)
LCSW consulted for SNF placement.   Per oncology note patient more appropriate for palliative care or hospice. According to patient notes palliative met with family on 7/22. Family would like to discuss with patient before making a decision.   LCSW will follow for disposition, pending full palliative note.   Carolin Coy [facility] [place]

## 2017-12-01 NOTE — Progress Notes (Signed)
Pt screaming in pain, given 0.5 mg of Morphine at 2134. Medication and dosage were explained to patient multiple times. Pt called out an hour later, asking what pain med she was given. Explained to patient that once again she was given morphine. Pt states that she's allergic to morphine and that it makes her feel "high". Pt appeared anxious, fidgety, complained of feeling uneasy. Nightshift hospitalist notified. 1 mg Ativan PO given. PT is resting in bed quietly with eyes closed at this time.

## 2017-12-01 NOTE — Progress Notes (Signed)
CHART NOTE I saw Ms. care of this morning.  She was sleeping and has very poor communication with me.  She is a very pleasant 77 years old African-American female with multiple medical problems who was recently diagnosed with a stage IV non-small cell lung cancer, squamous cell carcinoma.  The patient was a started on systemic chemotherapy with reduced dose carboplatin, paclitaxel and Keytruda. She has a rough time with her chemotherapy and the patient was admitted few days after her treatment with questionable pneumonia as well as deconditioning. Based on her condition today, I do not think the patient will be a good candidate for any further systemic chemotherapy.  I would strongly recommend for the patient to consider palliative care and hospice. Thank you so much for taken good care of [name], please let me know if I can help in any way.

## 2017-12-02 ENCOUNTER — Other Ambulatory Visit: Payer: Self-pay

## 2017-12-02 DIAGNOSIS — J449 Chronic obstructive pulmonary disease, unspecified: Secondary | ICD-10-CM | POA: Diagnosis not present

## 2017-12-02 DIAGNOSIS — N179 Acute kidney failure, unspecified: Secondary | ICD-10-CM | POA: Diagnosis not present

## 2017-12-02 DIAGNOSIS — M2550 Pain in unspecified joint: Secondary | ICD-10-CM | POA: Diagnosis not present

## 2017-12-02 DIAGNOSIS — I63349 Cerebral infarction due to thrombosis of unspecified cerebellar artery: Secondary | ICD-10-CM | POA: Diagnosis not present

## 2017-12-02 DIAGNOSIS — N178 Other acute kidney failure: Secondary | ICD-10-CM | POA: Diagnosis not present

## 2017-12-02 DIAGNOSIS — I10 Essential (primary) hypertension: Secondary | ICD-10-CM | POA: Diagnosis not present

## 2017-12-02 DIAGNOSIS — R278 Other lack of coordination: Secondary | ICD-10-CM | POA: Diagnosis not present

## 2017-12-02 DIAGNOSIS — N390 Urinary tract infection, site not specified: Secondary | ICD-10-CM | POA: Diagnosis not present

## 2017-12-02 DIAGNOSIS — I639 Cerebral infarction, unspecified: Secondary | ICD-10-CM | POA: Diagnosis not present

## 2017-12-02 DIAGNOSIS — L0231 Cutaneous abscess of buttock: Secondary | ICD-10-CM | POA: Diagnosis not present

## 2017-12-02 DIAGNOSIS — L89620 Pressure ulcer of left heel, unstageable: Secondary | ICD-10-CM | POA: Diagnosis not present

## 2017-12-02 DIAGNOSIS — Z515 Encounter for palliative care: Secondary | ICD-10-CM | POA: Diagnosis not present

## 2017-12-02 DIAGNOSIS — N183 Chronic kidney disease, stage 3 (moderate): Secondary | ICD-10-CM | POA: Diagnosis not present

## 2017-12-02 DIAGNOSIS — J9600 Acute respiratory failure, unspecified whether with hypoxia or hypercapnia: Secondary | ICD-10-CM | POA: Diagnosis not present

## 2017-12-02 DIAGNOSIS — R05 Cough: Secondary | ICD-10-CM | POA: Diagnosis not present

## 2017-12-02 DIAGNOSIS — A419 Sepsis, unspecified organism: Secondary | ICD-10-CM | POA: Diagnosis not present

## 2017-12-02 DIAGNOSIS — M6281 Muscle weakness (generalized): Secondary | ICD-10-CM | POA: Diagnosis not present

## 2017-12-02 DIAGNOSIS — C3491 Malignant neoplasm of unspecified part of right bronchus or lung: Secondary | ICD-10-CM | POA: Diagnosis not present

## 2017-12-02 DIAGNOSIS — C3490 Malignant neoplasm of unspecified part of unspecified bronchus or lung: Secondary | ICD-10-CM | POA: Diagnosis not present

## 2017-12-02 DIAGNOSIS — L89610 Pressure ulcer of right heel, unstageable: Secondary | ICD-10-CM | POA: Diagnosis not present

## 2017-12-02 DIAGNOSIS — J189 Pneumonia, unspecified organism: Secondary | ICD-10-CM | POA: Diagnosis not present

## 2017-12-02 DIAGNOSIS — Z7401 Bed confinement status: Secondary | ICD-10-CM | POA: Diagnosis not present

## 2017-12-02 NOTE — Clinical Social Work Placement (Signed)
   11:25 AM Ptient and family chose bed at [facility].  LCSW confirmed bed with facility.   LCSW faxed dc docs to facility.   Family notified of transport.  Patient will transport by [facility]AR.   RN report #: [id number]  CLINICAL SOCIAL WORK PLACEMENT  NOTE  Date:  12/02/2017  Patient Details  Name: [name] MRN: [id number] Date of Birth: 1940-11-05  Clinical Social Work is seeking post-discharge placement for this patient at the [place] level of care (*CSW will initial, date and re-position this form in  chart as items are completed):  Yes   Patient/family provided with [place] Work Department's list of facilities offering this level of care within the geographic area requested by the patient (or if unable, by the patient's family).  Yes   Patient/family informed of their freedom to choose among providers that offer the needed level of care, that participate in Medicare, Medicaid or managed care program needed by the patient, have an available bed and are willing to accept the patient.  Yes   Patient/family informed of [facility]'s ownership interest in [facility] and [facility], as well as of the fact that they are under no obligation to receive care at these facilities.  PASRR submitted to EDS on       PASRR number received on 12/01/17     Existing PASRR number confirmed on       FL2 transmitted to all facilities in geographic area requested by pt/family on 12/01/17     FL2 transmitted to all facilities within larger geographic area on       Patient informed that his/her managed care company has contracts with or will negotiate with certain facilities, including the following:        Yes   Patient/family informed of bed offers received.  Patient chooses bed at [facility]     Physician recommends and patient chooses bed at      Patient to be transferred to [facility] on  12/02/17.  Patient to be transferred to facility by EMS     Patient family notified on 12/02/17 of transfer.  Name of family member notified:  [facility]     PHYSICIAN       Additional Comment:    _______________________________________________ Servando Snare, LCSW 12/02/2017, 11:25 AM

## 2017-12-02 NOTE — Progress Notes (Signed)
Called [facility] and gave report to [place]. Awaiting transport

## 2017-12-02 NOTE — Patient Outreach (Signed)
[facility]) Care Management  12/02/2017  [name] 12-14-1940 [id number]   77 year old with history of acute renal failure, UTI, malignant neoplasm of right lung,pneumonia, legally blind, diabetes, hard of hearing  Case closure. Client per chart: admitted 7/16-7/24 with sepsis, e.coli in stool, new right cerebellar acute stroke. Discharged to skilled facility. RNCM will close case. Per chart palliative care to follow at the facility. Referral to [place] work to follow while in skilled facility placed per hospital liaison.   Plan: close case.  Thea Silversmith, RN, MSN, [place] Coordinator Cell: [phone]

## 2017-12-02 NOTE — Progress Notes (Signed)
Daily Progress Note   Patient Name: [name]       Date: 12/02/2017 DOB: 20-Nov-1940  Age: 77 y.o. MRN#: [id number] Attending Physician: Cristy Folks, MD Primary Care Physician: Sid Falcon, MD Admit Date: 11/24/2017  Reason for Consultation/Follow-up: Establishing goals of care  Subjective: F/u with patient at bedside. No family present. Patient tells me she feels better this morning. Less dyspnea at rest and speech is more coherent this morning. Patient speaks of having to continue to "fight." She is hopeful she can leave the hospital today. I explained that she will be going to rehab. Patient ate majority of ice cream cup for me.   Length of Stay: 7  Current Medications: Scheduled Meds:  . sodium chloride   Intravenous Once  . atorvastatin  40 mg Oral Daily  . clopidogrel  75 mg Oral Q breakfast  . ipratropium-albuterol  3 mL Nebulization TID    Continuous Infusions: . sodium chloride 75 mL/hr at 12/01/17 2313    PRN Meds: acetaminophen **OR** acetaminophen, albuterol, ALPRAZolam, alum & mag hydroxide-simeth, HYDROcodone-acetaminophen, ondansetron **OR** ondansetron (ZOFRAN) IV  Physical Exam  Constitutional: She is oriented to person, place, and time. She appears ill.  HENT:  Head: Normocephalic and atraumatic.  Pulmonary/Chest: No accessory muscle usage. No tachypnea. No respiratory distress. She has wheezes.  Intermittent dyspnea at rest  Neurological: She is alert and oriented to person, place, and time.  Legally blind. HOH  Skin: Skin is warm and dry.  Psychiatric: She has a normal mood and affect. Her speech is normal and behavior is normal.  Nursing note and vitals reviewed.          Vital Signs: BP 133/65 (BP Location: Right Arm)   Pulse (!) 113    Temp 98.6 F (37 C) (Oral)   Resp 16   Ht 5\' 4"  (1.626 m)   Wt 60.5 kg (133 lb 6.1 oz)   LMP  (LMP Unknown)   SpO2 90%   BMI 22.89 kg/m  SpO2: SpO2: 90 % O2 Device: O2 Device: Room Air O2 Flow Rate: O2 Flow Rate (L/min): 3 L/min  Intake/output summary:   Intake/Output Summary (Last 24 hours) at 12/02/2017 0945 Last data filed at 12/02/2017 0357 Gross per 24 hour  Intake 800 ml  Output -  Net 800 ml  LBM: Last BM Date: 11/30/17 Baseline Weight: Weight: 57.6 kg (127 lb) Most recent weight: Weight: 60.5 kg (133 lb 6.1 oz)       Palliative Assessment/Data: PPS 40%   Flowsheet Rows     Most Recent Value  Intake Tab  Referral Department  Hospitalist  Unit at Time of Referral  Med/Surg Unit  Palliative Care Primary Diagnosis  Cancer  Palliative Care Type  New Palliative care  Reason for referral  Clarify Goals of Care  Date first seen by Palliative Care  11/30/17  Clinical Assessment  Palliative Performance Scale Score  40%  Psychosocial & Spiritual Assessment  Palliative Care Outcomes  Patient/Family meeting held?  Yes  Who was at the meeting?  patient, son, daughter-in-law  Palliative Care Outcomes  Clarified goals of care, Counseled regarding hospice, Provided psychosocial or spiritual support, ACP counseling assistance, Provided end of life care assistance, Improved pain interventions, Improved non-pain symptom therapy      Patient Active Problem List   Diagnosis Date Noted  . Palliative care by specialist   . Acute respiratory distress   . Malignant neoplasm of right lung ([place])   . Wheezing   . Hypoxia   . Sepsis ([place]) 11/25/2017  . ARF (acute renal failure) ([place]) 11/25/2017  . Acute lower UTI 11/25/2017  . Diarrhea 11/25/2017  . Pancytopenia ([place]) 11/25/2017  . Stage IV squamous cell carcinoma of right lung ([place]) 11/03/2017  . Goals of care, counseling/discussion 11/03/2017  . Encounter for antineoplastic chemotherapy 11/03/2017  . Mediastinal  lymphadenopathy 10/16/2017  . Lung mass 10/16/2017  . Symptomatic anemia   . Malnutrition of moderate degree 10/12/2017  . Orthostatic hypotension 09/15/2017  . Mass of upper lobe of right lung   . Acute cystitis with hematuria   . Pneumonia 09/13/2017  . Acute-on-chronic kidney injury ([place]) 09/13/2017  . Ganglion cyst of finger 03/26/2017  . S/P laparoscopic cholecystectomy 01/14/2017  . PVD (peripheral vascular disease) s/p LLE PTA 03/2016, RLE PTA 04/2016   . Bilateral foot pain 02/20/2016  . Epidermoid cyst 11/01/2015  . Vitamin D deficiency 03/29/2014  . COPD (chronic obstructive pulmonary disease) ([place]) 02/21/2014  . Postmenopausal 06/16/2013  . Preventative health care 06/16/2013  . Constipation 11/04/2012  . Bilateral shoulder pain 09/01/2012  . CKD (chronic kidney disease) stage 3, GFR 30-59 ml/min (HCC) 11/28/2009  . Hyperlipidemia 07/02/2006  . Hearing loss 05/13/2006  . TOBACCO ABUSE 03/26/2006  . BLINDNESS 03/26/2006  . Essential hypertension 03/26/2006    Palliative Care Assessment & Plan   Patient Profile: 77 y.o. female  with past medical history of HTN, HLD, COPD, CKD, DVT, anemia, arthritis, legally blind admitted on 11/24/2017 with diarrhea, abdominal pain. In ED, patient with hypotension, tachycardia, febrile, and elevated lactate concerning for sepsis. UA concerning for UTI. Chest xray showed possible infiltrates. CT abdomen unremarkable. Starting on antibiotics for HCAP. 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 Patient newly diagnosed with non-small cell lung cancer s/p one round of chemotherapy. Also with AKI superimposed on CKD. Patient is not a candidate for dialysis. Per oncology, patient is not a candidate for further chemotherapy. Palliative medicine consultation for goals of care.   Assessment: Acute respiratory distress Non-small cell lung cancer HCAP Acute right cerebellar stroke AKI superimposed on  CKD Pancytopenia  Recommendations/Plan:  Patient does not like the way morphine makes her feel and does not want to take for pain/dyspnea. Restarted home dose of hydrocodone 1tab (5-325) q4h prn pain/dyspnea.   Xanax 0.25mg  PO  BID prn anxiety  No family at bedside this morning. Good initial palliative discussion on 7/22. Will need ongoing palliative [place] discussions at SNF. Likely will discharge to SNF today. Updated DIL via telephone. Encouraged DIL to f/u with outpatient palliative referral at SNF. She understands this can transition to hospice services in the near future if she does not progress at rehab.     Code Status: Limited   Code Status Orders  (From admission, onward)        Start     Ordered   11/25/17 1720  Limited resuscitation (code)  Continuous    Question Answer Comment  In the event of cardiac or respiratory ARREST: Initiate Code Blue, Call Rapid Response Yes   In the event of cardiac or respiratory ARREST: Perform CPR Yes   In the event of cardiac or respiratory ARREST: Perform Intubation/Mechanical Ventilation No   In the event of cardiac or respiratory ARREST: Use NIPPV/BiPAp only if indicated Yes   In the event of cardiac or respiratory ARREST: Administer ACLS medications if indicated Yes   In the event of cardiac or respiratory ARREST: Perform Defibrillation or Cardioversion if indicated Yes      11/25/17 1720    Code Status History    Date Active Date Inactive Code Status Order ID Comments User Context   11/25/2017 0421 11/25/2017 1720 Full Code [id number]  Rise Patience, MD ED   10/16/2017 1529 10/17/2017 1525 Full Code [id number]  Collene Gobble, MD Inpatient   10/11/2017 1830 10/13/2017 1631 Full Code [id number]  Molt, [place], DO ED   09/13/2017 2028 09/15/2017 2054 Full Code [id number]  Zada Finders, MD Inpatient   01/14/2017 1537 01/17/2017 1813 Full Code [id number]  Rosita Fire, MD ED   04/24/2016 1654 04/25/2016 1323 Full Code [id number]  Lorretta Harp, MD Inpatient   04/06/2016 1407 04/08/2016 1259 Full Code [id number]  Cheryln Manly, NP Inpatient   03/16/2016 1254 03/17/2016 1537 Full Code [id number]  Leanor Kail, [place] Inpatient   02/21/2014 0035 02/21/2014 1933 Full Code [id number]  Jones Bales, MD Inpatient       Prognosis:   Unable to determine: guarded with new diagnosis of non-small cell lung cancer and declining functional and nutritional status.  Discharge Planning:  [place] for rehab with Palliative care service follow-up  Care plan was discussed with patient, RN, DIL [name])  Thank you for allowing the Palliative Medicine Team to assist in the care of this patient.    Time In: 0925 Time Out: 0945 Total Time 28min Prolonged Time Billed no      Greater than 50%  of this time was spent counseling and coordinating care related to the above assessment and plan.  Ihor Dow, FNP-C Palliative Medicine Team  Phone: [phone] Fax: [phone]  Please contact Palliative Medicine Team phone at [phone] for questions and concerns.

## 2017-12-03 ENCOUNTER — Encounter: Payer: Self-pay | Admitting: *Deleted

## 2017-12-03 NOTE — Progress Notes (Signed)
Oncology Nurse Navigator Documentation  Oncology Nurse Navigator Flowsheets 12/03/2017  Navigator Location [facility]  Navigator Encounter Type Other/Dr. Julien Nordmann updated me that patient is not going to receive more treatment.  Looking and chart notes, she is going to SNF with palliative care then hospice.  I updated scheduling to cancel appts  Barriers/Navigation Needs Coordination of Care  Interventions Coordination of Care  Coordination of Care Other  Acuity Level 2  Time Spent with Patient 15

## 2017-12-04 ENCOUNTER — Telehealth: Payer: Self-pay | Admitting: Internal Medicine

## 2017-12-04 DIAGNOSIS — I63349 Cerebral infarction due to thrombosis of unspecified cerebellar artery: Secondary | ICD-10-CM | POA: Diagnosis not present

## 2017-12-04 DIAGNOSIS — N178 Other acute kidney failure: Secondary | ICD-10-CM | POA: Diagnosis not present

## 2017-12-04 DIAGNOSIS — C3491 Malignant neoplasm of unspecified part of right bronchus or lung: Secondary | ICD-10-CM | POA: Diagnosis not present

## 2017-12-04 DIAGNOSIS — J189 Pneumonia, unspecified organism: Secondary | ICD-10-CM | POA: Diagnosis not present

## 2017-12-04 NOTE — Telephone Encounter (Signed)
Per 7/25 sch msg, per Hinton Dyer H./ Dr. Julien Nordmann, cancel appst at cancer center. Patient is going with palliative then hospice care

## 2017-12-05 DIAGNOSIS — N183 Chronic kidney disease, stage 3 (moderate): Secondary | ICD-10-CM | POA: Diagnosis not present

## 2017-12-05 DIAGNOSIS — I63349 Cerebral infarction due to thrombosis of unspecified cerebellar artery: Secondary | ICD-10-CM | POA: Diagnosis not present

## 2017-12-05 DIAGNOSIS — C3491 Malignant neoplasm of unspecified part of right bronchus or lung: Secondary | ICD-10-CM | POA: Diagnosis not present

## 2017-12-05 DIAGNOSIS — J189 Pneumonia, unspecified organism: Secondary | ICD-10-CM | POA: Diagnosis not present

## 2017-12-07 ENCOUNTER — Non-Acute Institutional Stay: Payer: Medicare Other | Admitting: Hospice and Palliative Medicine

## 2017-12-07 ENCOUNTER — Ambulatory Visit: Payer: Self-pay | Admitting: *Deleted

## 2017-12-07 DIAGNOSIS — Z515 Encounter for palliative care: Secondary | ICD-10-CM

## 2017-12-07 DIAGNOSIS — R531 Weakness: Secondary | ICD-10-CM

## 2017-12-08 ENCOUNTER — Inpatient Hospital Stay: Payer: Medicare Other | Admitting: Internal Medicine

## 2017-12-08 ENCOUNTER — Inpatient Hospital Stay: Payer: Medicare Other

## 2017-12-08 NOTE — Progress Notes (Signed)
PALLIATIVE CARE CONSULT VISIT   PATIENT NAME: [name] DOB: 1940-08-12 MRN: [id number]  PRIMARY CARE PROVIDER:   Sid Falcon, MD  REFERRING PROVIDER:  Sid Falcon, MD [place], [place] [address]  RESPONSIBLE PARTY: Son  ASSESSMENT:      Patient is comfortable appearing. She is somewhat difficult to understand given poor speech pattern. She is also legally blind and hard of hearing. These deficits make it challenging to have an in depth discussion with her about her goals. I discussed her care with the nursing/social work team. She is working with PT and able to get to a chair. It sounds like her current functional status is markedly different than her previous baseline. It is unclear if her functional limitations will prohibit her from receiving future treatment for the NSCLC.   No family is present. Will call to clarify goals.   RECOMMENDATIONS and PLAN:  1. Continue supportive care  I spent 30 minutes providing this consultation,  from 1230 to 1300. More than 50% of the time in this consultation was spent coordinating communication.   HISTORY OF PRESENT ILLNESS:  [name] is a 77 y.o. 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 She was also found to have an acute R. Cerebellar CVA. Patient was seen in consultation by palliative care while in the hospital. She was ultimately discharged to rehab and palliative care has been asked to follow.   CODE STATUS: DNR  PPS: 40% HOSPICE ELIGIBILITY/DIAGNOSIS: TBD  PAST MEDICAL HISTORY:  Past Medical History:  Diagnosis Date  . Anemia   . Arthritis    "left shoulder" (04/07/2016)  . Blind    s/p bilateral corneal transplant; "legally blind in both eyes" (04/07/2016)  . Cancer ([place])    lung  . CKD (chronic kidney disease), stage III ([place])   . COPD (chronic  obstructive pulmonary disease) ([place])    pt denies  . DVT (deep venous thrombosis) ([place]) "years ago"   "right thigh"    . HEARING LOSS   . Hyperlipidemia   . Hypertension   . PAD (peripheral artery disease) ([place])    a. 03/2016 Periph Angio: L SFA 99p, 29m w/ one vessel runoff. R SFA 194m w/ one vessel runofff;  b. 04/07/2016 PTA L SFA (Hawk 1 LS atherecromty w/ DEBA). c. 04/24/16 s/p PTA to R SFA, right tibioperoneal trunk and posterior tibial artery.  . Phlebitis   . Squamous cell carcinoma lung ([place])   . Tobacco abuse     SOCIAL HX:  Social History   Tobacco Use  . Smoking status: Former Smoker    Packs/day: 0.50    Years: 62.00    Pack years: 31.00    Types: Cigarettes  . Smokeless tobacco: Never Used  . Tobacco comment: Quit 06/2017  Substance Use Topics  . Alcohol use: No    Alcohol/week: 0.0 oz    ALLERGIES:  Allergies  Allergen Reactions  . Codeine Other (See Comments)    REACTION: Pt c/o "feeling high"  . Morphine Other (See Comments)    REACTION: feels funny and "high"     PERTINENT MEDICATIONS:  Outpatient Encounter Medications as of 12/07/2017  Medication Sig  . albuterol (PROVENTIL HFA;VENTOLIN HFA) 108 (90 Base) MCG/ACT inhaler Inhale 2 puffs into the lungs every 6 (six) hours as needed for wheezing or shortness of  breath.  . ALPRAZolam (XANAX) 0.25 MG tablet Take 1 tablet (0.25 mg total) by mouth 2 (two) times daily as needed for anxiety.  . clopidogrel (PLAVIX) 75 MG tablet Take 1 tablet (75 mg total) by mouth daily with breakfast. Okay to restart this medication on Sunday 10/18/2017.  Marland Kitchen HYDROcodone-acetaminophen (NORCO/VICODIN) 5-325 MG tablet Take 1 tablet by mouth every 4 (four) hours as needed for moderate pain.  . polyethylene glycol (MIRALAX) packet Take 17 g by mouth daily. (Patient not taking: Reported on 11/20/2017)   No facility-administered encounter medications on file as of 12/07/2017.     PHYSICAL EXAM:   General: NAD, frail appearing, thin,  lying in bed Cardiovascular: regular rate and rhythm Pulmonary: clear ant fields Abdomen: soft, nontender, + bowel sounds GU: no suprapubic tenderness Extremities: no edema, no joint deformities Skin: no rashes Neurological: Weakness, poor vocal pattern, answers simple questions and follows simple commands  [name], NP

## 2017-12-09 ENCOUNTER — Encounter: Payer: Self-pay | Admitting: Nurse Practitioner

## 2017-12-09 ENCOUNTER — Non-Acute Institutional Stay: Payer: Medicare Other | Admitting: Nurse Practitioner

## 2017-12-10 ENCOUNTER — Ambulatory Visit: Payer: Medicare Other

## 2017-12-10 DIAGNOSIS — C3491 Malignant neoplasm of unspecified part of right bronchus or lung: Secondary | ICD-10-CM | POA: Diagnosis not present

## 2017-12-10 DIAGNOSIS — J189 Pneumonia, unspecified organism: Secondary | ICD-10-CM | POA: Diagnosis not present

## 2017-12-10 DIAGNOSIS — I63349 Cerebral infarction due to thrombosis of unspecified cerebellar artery: Secondary | ICD-10-CM | POA: Diagnosis not present

## 2017-12-10 DIAGNOSIS — J449 Chronic obstructive pulmonary disease, unspecified: Secondary | ICD-10-CM | POA: Diagnosis not present

## 2017-12-11 ENCOUNTER — Ambulatory Visit: Payer: Self-pay | Admitting: *Deleted

## 2017-12-11 DIAGNOSIS — J449 Chronic obstructive pulmonary disease, unspecified: Secondary | ICD-10-CM | POA: Diagnosis not present

## 2017-12-11 DIAGNOSIS — C3491 Malignant neoplasm of unspecified part of right bronchus or lung: Secondary | ICD-10-CM | POA: Diagnosis not present

## 2017-12-11 DIAGNOSIS — L0231 Cutaneous abscess of buttock: Secondary | ICD-10-CM | POA: Diagnosis not present

## 2017-12-11 DIAGNOSIS — I63349 Cerebral infarction due to thrombosis of unspecified cerebellar artery: Secondary | ICD-10-CM | POA: Diagnosis not present

## 2017-12-14 ENCOUNTER — Ambulatory Visit: Payer: Self-pay | Admitting: *Deleted

## 2017-12-15 ENCOUNTER — Other Ambulatory Visit: Payer: Medicare Other

## 2017-12-16 DIAGNOSIS — L89610 Pressure ulcer of right heel, unstageable: Secondary | ICD-10-CM | POA: Diagnosis not present

## 2017-12-16 DIAGNOSIS — L89620 Pressure ulcer of left heel, unstageable: Secondary | ICD-10-CM | POA: Diagnosis not present

## 2017-12-17 ENCOUNTER — Non-Acute Institutional Stay: Payer: Medicare Other | Admitting: Hospice and Palliative Medicine

## 2017-12-17 ENCOUNTER — Other Ambulatory Visit: Payer: Self-pay | Admitting: *Deleted

## 2017-12-17 DIAGNOSIS — L89620 Pressure ulcer of left heel, unstageable: Secondary | ICD-10-CM | POA: Diagnosis not present

## 2017-12-17 DIAGNOSIS — Z515 Encounter for palliative care: Secondary | ICD-10-CM

## 2017-12-17 DIAGNOSIS — C3491 Malignant neoplasm of unspecified part of right bronchus or lung: Secondary | ICD-10-CM | POA: Diagnosis not present

## 2017-12-17 DIAGNOSIS — I63349 Cerebral infarction due to thrombosis of unspecified cerebellar artery: Secondary | ICD-10-CM | POA: Diagnosis not present

## 2017-12-17 DIAGNOSIS — L89610 Pressure ulcer of right heel, unstageable: Secondary | ICD-10-CM | POA: Diagnosis not present

## 2017-12-17 NOTE — Progress Notes (Signed)
PALLIATIVE CARE CONSULT VISIT   PATIENT NAME: [name] DOB: [birth year] MRN: [id number]  PRIMARY CARE PROVIDER:   Sid Falcon, MD  REFERRING PROVIDER:  Sid Falcon, MD [place], [place] [address]  RESPONSIBLE PARTY: Son  ASSESSMENT:     Patient appears more alert and interactive today. Nursing reports that she has been better able to work with therapy this week.   I called and spoke with patient's daughter. She also felt that patient has improved recently. She says family plans to take patient home with her after rehab is complete. We talked about patient's multiple health problems and risk for future decline. We talked about hospice as an option at home.    RECOMMENDATIONS and PLAN:  1. Continue supportive care  I spent 10 minutes providing this consultation,  from 1210 to 1220. More than 50% of the time in this consultation was spent coordinating communication.   HISTORY OF PRESENT ILLNESS:  [name] is a 77 y.o. 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 She was also found to have an acute R. Cerebellar CVA. Patient was seen in consultation by palliative care while in the hospital. She was ultimately discharged to rehab and palliative care has been asked to follow.   CODE STATUS: DNR  PPS: 40% HOSPICE ELIGIBILITY/DIAGNOSIS: TBD  PAST MEDICAL HISTORY:  Past Medical History:  Diagnosis Date  . Anemia   . Arthritis    "left shoulder" (04/07/2016)  . Blind    s/p bilateral corneal transplant; "legally blind in both eyes" (04/07/2016)  . Cancer ([place])    lung  . CKD (chronic kidney disease), stage III ([place])   . COPD (chronic obstructive pulmonary disease) ([place])    pt denies  . DVT (deep venous thrombosis) ([place]) "years ago"   "right thigh"    . HEARING LOSS   . Hyperlipidemia   . Hypertension     . PAD (peripheral artery disease) ([place])    a. 03/2016 Periph Angio: L SFA 99p, 89m w/ one vessel runoff. R SFA 187m w/ one vessel runofff;  b. 04/07/2016 PTA L SFA (Hawk 1 LS atherecromty w/ DEBA). c. 04/24/16 s/p PTA to R SFA, right tibioperoneal trunk and posterior tibial artery.  . Phlebitis   . Squamous cell carcinoma lung ([place])   . Tobacco abuse     SOCIAL HX:  Social History   Tobacco Use  . Smoking status: Former Smoker    Packs/day: 0.50    Years: 62.00    Pack years: 31.00    Types: Cigarettes  . Smokeless tobacco: Never Used  . Tobacco comment: Quit 06/2017  Substance Use Topics  . Alcohol use: No    Alcohol/week: 0.0 standard drinks    ALLERGIES:  Allergies  Allergen Reactions  . Codeine Other (See Comments)    REACTION: Pt c/o "feeling high"  . Morphine Other (See Comments)    REACTION: feels funny and "high"     PERTINENT MEDICATIONS:  Outpatient Encounter Medications as of 12/17/2017  Medication Sig  . albuterol (PROVENTIL HFA;VENTOLIN HFA) 108 (90 Base) MCG/ACT inhaler Inhale 2 puffs into the lungs every 6 (six) hours as needed for wheezing or shortness of breath.  . ALPRAZolam (XANAX) 0.25 MG tablet Take 1 tablet (0.25 mg total) by mouth 2 (two) times daily as needed for anxiety.  . clopidogrel (  PLAVIX) 75 MG tablet Take 1 tablet (75 mg total) by mouth daily with breakfast. Okay to restart this medication on Sunday 10/18/2017.  Marland Kitchen HYDROcodone-acetaminophen (NORCO/VICODIN) 5-325 MG tablet Take 1 tablet by mouth every 4 (four) hours as needed for moderate pain.  . polyethylene glycol (MIRALAX) packet Take 17 g by mouth daily.  . [DISCONTINUED] enoxaparin (LOVENOX) 80 MG/0.8ML SOLN injection Inject 0.8 mLs (80 mg total) into the skin every 12 (twelve) hours.   No facility-administered encounter medications on file as of 12/17/2017.     PHYSICAL EXAM:   General: NAD, frail appearing, thin, lying in bed Cardiovascular: regular rate and rhythm Pulmonary: clear ant  fields Abdomen: soft, nontender, + bowel sounds GU: no suprapubic tenderness Extremities: no edema, no joint deformities Skin: no rashes Neurological: Weakness, speech clearer, follows simple commands  [name], NP

## 2017-12-17 NOTE — Patient Outreach (Signed)
[name] [facility]) Care Management  12/17/2017  [name] 11-11-1940 [id number]   CSW had received referral from [place], Orrin Brigham that patient had discharged to [facility] from [facility] on 12/02/17. CSW met with patient at bedside (room #: [id number]) and spoke with patient's daughter, [name] who informed CSW that patient was previously living at [facility] and she has been working with therapy but daughter informed CSW that patient's family plans to take patient home with them at discharge. [facility] continues to follow from the hospital. CSW will follow-up with patient in 2 weeks.    Raynaldo Opitz, LCSW [facility]  Clinical Social Worker cell #: [phone]

## 2017-12-22 ENCOUNTER — Other Ambulatory Visit: Payer: Medicare Other

## 2017-12-23 ENCOUNTER — Non-Acute Institutional Stay: Payer: Medicare Other | Admitting: Hospice and Palliative Medicine

## 2017-12-23 DIAGNOSIS — L89620 Pressure ulcer of left heel, unstageable: Secondary | ICD-10-CM | POA: Diagnosis not present

## 2017-12-23 DIAGNOSIS — L89610 Pressure ulcer of right heel, unstageable: Secondary | ICD-10-CM | POA: Diagnosis not present

## 2017-12-23 DIAGNOSIS — Z515 Encounter for palliative care: Secondary | ICD-10-CM | POA: Diagnosis not present

## 2017-12-23 NOTE — Progress Notes (Signed)
PALLIATIVE CARE CONSULT VISIT   PATIENT NAME: [name] DOB: 07-06-1940 MRN: [id number]  PRIMARY CARE PROVIDER:   Sid Falcon, MD  REFERRING PROVIDER:  Sid Falcon, MD [place], [place] [address]  RESPONSIBLE PARTY: Son  ASSESSMENT:     Patient appears clinically unchanged since last seen. Staff had no concerns today. Patient is comfortable appearing and does not appear to have any distressing symptoms. Oral intake is reportedly adequate.   RECOMMENDATIONS and PLAN:  1. Continue supportive care  I spent 10 minutes providing this consultation,  from 1110 to 1120. More than 50% of the time in this consultation was spent coordinating communication.   HISTORY OF PRESENT ILLNESS:  [name] is a 77 y.o. 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 She was also found to have an acute R. Cerebellar CVA. Patient was seen in consultation by palliative care while in the hospital. She was ultimately discharged to rehab and palliative care has been asked to follow.   CODE STATUS: DNR  PPS: 40% HOSPICE ELIGIBILITY/DIAGNOSIS: TBD  PAST MEDICAL HISTORY:  Past Medical History:  Diagnosis Date  . Anemia   . Arthritis    "left shoulder" (04/07/2016)  . Blind    s/p bilateral corneal transplant; "legally blind in both eyes" (04/07/2016)  . Cancer ([place])    lung  . CKD (chronic kidney disease), stage III ([place])   . COPD (chronic obstructive pulmonary disease) ([place])    pt denies  . DVT (deep venous thrombosis) ([place]) "years ago"   "right thigh"    . HEARING LOSS   . Hyperlipidemia   . Hypertension   . PAD (peripheral artery disease) ([place])    a. 03/2016 Periph Angio: L SFA 99p, 27m w/ one vessel runoff. R SFA 150m w/ one vessel runofff;  b. 04/07/2016 PTA L SFA (Hawk 1 LS atherecromty w/ DEBA). c. 04/24/16 s/p PTA to R  SFA, right tibioperoneal trunk and posterior tibial artery.  . Phlebitis   . Squamous cell carcinoma lung ([place])   . Tobacco abuse     SOCIAL HX:  Social History   Tobacco Use  . Smoking status: Former Smoker    Packs/day: 0.50    Years: 62.00    Pack years: 31.00    Types: Cigarettes  . Smokeless tobacco: Never Used  . Tobacco comment: Quit 06/2017  Substance Use Topics  . Alcohol use: No    Alcohol/week: 0.0 standard drinks    ALLERGIES:  Allergies  Allergen Reactions  . Codeine Other (See Comments)    REACTION: Pt c/o "feeling high"  . Morphine Other (See Comments)    REACTION: feels funny and "high"     PERTINENT MEDICATIONS:  Outpatient Encounter Medications as of 12/23/2017  Medication Sig  . albuterol (PROVENTIL HFA;VENTOLIN HFA) 108 (90 Base) MCG/ACT inhaler Inhale 2 puffs into the lungs every 6 (six) hours as needed for wheezing or shortness of breath.  . ALPRAZolam (XANAX) 0.25 MG tablet Take 1 tablet (0.25 mg total) by mouth 2 (two) times daily as needed for anxiety.  . clopidogrel (PLAVIX) 75 MG tablet Take 1 tablet (75 mg total) by mouth daily with breakfast. Okay to restart this medication on Sunday 10/18/2017.  Marland Kitchen HYDROcodone-acetaminophen (NORCO/VICODIN) 5-325 MG tablet Take 1 tablet by mouth every 4 (four) hours as needed for moderate pain.  Marland Kitchen  polyethylene glycol (MIRALAX) packet Take 17 g by mouth daily.  . [DISCONTINUED] enoxaparin (LOVENOX) 80 MG/0.8ML SOLN injection Inject 0.8 mLs (80 mg total) into the skin every 12 (twelve) hours.   No facility-administered encounter medications on file as of 12/23/2017.     PHYSICAL EXAM:   General: NAD, frail appearing, thin, lying in bed Pulmonary: unlabored Extremities: no edema Skin: no rashes Neurological: Weakness, wakes when stimulated  [name], NP

## 2017-12-24 DIAGNOSIS — J189 Pneumonia, unspecified organism: Secondary | ICD-10-CM | POA: Diagnosis not present

## 2017-12-24 DIAGNOSIS — N183 Chronic kidney disease, stage 3 (moderate): Secondary | ICD-10-CM | POA: Diagnosis not present

## 2017-12-24 DIAGNOSIS — I639 Cerebral infarction, unspecified: Secondary | ICD-10-CM | POA: Diagnosis not present

## 2017-12-24 DIAGNOSIS — N179 Acute kidney failure, unspecified: Secondary | ICD-10-CM | POA: Diagnosis not present

## 2017-12-24 DIAGNOSIS — A419 Sepsis, unspecified organism: Secondary | ICD-10-CM | POA: Diagnosis not present

## 2017-12-28 ENCOUNTER — Non-Acute Institutional Stay: Payer: Medicare Other | Admitting: Hospice and Palliative Medicine

## 2017-12-28 DIAGNOSIS — J449 Chronic obstructive pulmonary disease, unspecified: Secondary | ICD-10-CM | POA: Diagnosis not present

## 2017-12-28 DIAGNOSIS — I739 Peripheral vascular disease, unspecified: Secondary | ICD-10-CM | POA: Diagnosis not present

## 2017-12-28 DIAGNOSIS — J189 Pneumonia, unspecified organism: Secondary | ICD-10-CM | POA: Diagnosis not present

## 2017-12-28 DIAGNOSIS — N183 Chronic kidney disease, stage 3 (moderate): Secondary | ICD-10-CM | POA: Diagnosis not present

## 2017-12-29 ENCOUNTER — Telehealth: Payer: Self-pay

## 2017-12-29 ENCOUNTER — Ambulatory Visit: Payer: Medicare Other

## 2017-12-29 ENCOUNTER — Other Ambulatory Visit: Payer: Self-pay | Admitting: *Deleted

## 2017-12-29 ENCOUNTER — Other Ambulatory Visit: Payer: Medicare Other

## 2017-12-29 ENCOUNTER — Ambulatory Visit: Payer: Medicare Other | Admitting: Oncology

## 2017-12-29 NOTE — Telephone Encounter (Signed)
Don with [facility] requesting VO for OT 1 x week for 4 weeks for IADL, ADL, exercise and transfer. Per Timmothy Sours, DC when goals met. Please call back.

## 2017-12-29 NOTE — Progress Notes (Signed)
Visit attempted. Patient no longer at the facility.

## 2017-12-29 NOTE — Telephone Encounter (Signed)
Rtc, actually 1x week for 5 weeks for exercise, strengthening, safety, transfers, avoiding pressure ulcers, IADL and ADL discharge when goals met. VO given do you agree? 

## 2017-12-29 NOTE — Patient Outreach (Signed)
[facility]) Care Management  12/29/2017  [name] Jan 30, 1941 [id number]    CSW received [name] notification that patient discharged from [facility] on 12/25/17. CSW attempted to reach patient at her home phone number, but no answer - CSW left voicemail. CSW then called & spoke with patient's daughter, [name] (phone #: [phone]) who informed CSW that patient is now living with her at [place], [place] and she is agreeable to having [facility] RNCM to follow for transition of care. CSW will make referral and sign off as no further CSW needs identified at this time.    Raynaldo Opitz, LCSW [facility]  Clinical Social Worker cell #: [phone]

## 2017-12-30 ENCOUNTER — Telehealth: Payer: Self-pay | Admitting: Internal Medicine

## 2017-12-30 NOTE — Telephone Encounter (Signed)
Placed call to [phone]. No answer and no identifying information on recording. Left message requesting return call without any patient info. Hubbard Hartshorn, RN, BSN

## 2017-12-30 NOTE — Telephone Encounter (Signed)
PLEASE CALL [facility] FOR [facility] AT [phone], HE NEEDS VERBAL ORDERS FOR PATIENT FOR IN HOME CARE

## 2017-12-30 NOTE — Telephone Encounter (Signed)
VO OK 

## 2017-12-31 ENCOUNTER — Ambulatory Visit: Payer: Medicare Other

## 2017-12-31 ENCOUNTER — Other Ambulatory Visit: Payer: Self-pay | Admitting: *Deleted

## 2017-12-31 ENCOUNTER — Telehealth: Payer: Self-pay | Admitting: Internal Medicine

## 2017-12-31 ENCOUNTER — Ambulatory Visit: Payer: Medicare Other | Admitting: *Deleted

## 2017-12-31 DIAGNOSIS — N183 Chronic kidney disease, stage 3 (moderate): Secondary | ICD-10-CM | POA: Diagnosis not present

## 2017-12-31 DIAGNOSIS — J189 Pneumonia, unspecified organism: Secondary | ICD-10-CM | POA: Diagnosis not present

## 2017-12-31 DIAGNOSIS — J449 Chronic obstructive pulmonary disease, unspecified: Secondary | ICD-10-CM | POA: Diagnosis not present

## 2017-12-31 DIAGNOSIS — I739 Peripheral vascular disease, unspecified: Secondary | ICD-10-CM | POA: Diagnosis not present

## 2017-12-31 DIAGNOSIS — C3491 Malignant neoplasm of unspecified part of right bronchus or lung: Secondary | ICD-10-CM

## 2017-12-31 NOTE — Telephone Encounter (Signed)
[facility] requested verbal orders; pls call [phone]  Missed lauren call yesterday

## 2017-12-31 NOTE — Telephone Encounter (Signed)
[name] from [facility] want to know if there can be an order place for a nebulizer  machine urgently. She call [phone]

## 2017-12-31 NOTE — Patient Outreach (Signed)
[facility]) Care Management  12/31/2017  KAYLIAH TINDOL 1940-10-17 [id number]   Referral received from LCSW as member was recently discharged from SNF after being hospitalized 0/62-6/94 for complications of lung cancer after undergoing chemotherapy.  Per chart, she also has history of COPD, hypertension, and chronic kidney disease.   Call placed to member's daughter as member no longer lives at listed address but instead with daughter.  Member's identity verified.  This care manager introduced self and purpose of call.  [facility] care management services explained.  She report member has been progressing, however continue to have wheezing and shortness of breath.  She is concerned because member has been using inhaler as prescribed, but she was also provided with medication for a nebulizer when discharged from SNF.  She does not have the machine in the home and daughter is questioning about getting one as she feel that has helped to manage her breathing better than the inhaler alone.    Member has home health that are active in care.  Other than nebulizer machine, daughter denies any urgent concerns.  Agrees to home visit within the next 2 weeks but will follow up telephonically within the next week.  [facility] CM Care Plan Problem One     Most Recent Value  Care Plan Problem One  Risk for readmission related to lung cancer as evidenced by recent hospitalization requiring stay are rehab  Role Documenting the Problem One  Care Management [place] for Problem One  Active  [facility] Long Term Goal   Member will not be readmitted to hospital within the next 31 days  [facility] Long Term Goal Start Date  12/31/17  Interventions for Problem One Long Term Goal  Discussed with member the importance of following discharge instructions, including follow up appointments, medications, diet, and home health involvement, to decrease the risk of readmission  [facility] CM Short Term Goal #1   Member/family  will report use of medications as prescribed over the next 4 weeks  [facility] CM Short Term Goal #1 Start Date  12/31/17  Interventions for Short Term Goal #1  Medications reviewed with daughter.  Call placed to primary MD office to report concern regarding use of nebulizer (has medications but no machine)  [facility] CM Short Term Goal #2   Member will keep and attend follow up appointment with primary MD and pulmonologist within the next 4 weeks  [facility] CM Short Term Goal #2 Start Date  12/31/17  Interventions for Short Term Goal #2  Upcoming appointments discussed with member's daughter.  Confirmed she will provide transportation, educated on importance of close follow up.     Valente David, [place], MSN [place] [phone]

## 2017-12-31 NOTE — Telephone Encounter (Signed)
Call placed to [facility] nurse Ms Truman Hayward, who states pt's dtr [name] (phone [phone]) states pt came home last Friday, 8/16, from [facility], with 100 doses of Albuterol 2.5/3 ml Albuterol for a nebulizer but no nebulizer. I called pt's dtr, Ms Loni Dolly, who confirmed pt was getting BID Albuterol nebulizer treatments at [facility]. Ms Loni Dolly also states that pt has been using her inhaler but it is not controlling her wheezing as well as the nebulizer BID + inhaler she was using at [facility]. Ms Loni Dolly states [facility] had told her they would set up home health for pt also but no home health agency had contacted her. Pt has appt with [facility] on Friday, August 30.  Pt's dtr requesting Dr. Daryll Drown please order a nebulizer for pt to use BID with the Albuterol nebulized meds she has on hand. Pt's dtr also asking if we can set up home health for pt.  I instructed Ms Loni Dolly I would forward these requests to Dr. Daryll Drown tonight and we would get back with her tomorrow to follow up on the nebulizer and home health requests.

## 2017-12-31 NOTE — Telephone Encounter (Signed)
Return call to Dwyane Dee, [place] at [facility] - requesting verbal order for "PT twice a week x 3 weeks then once a week x 1 week"; vo given if not appropriate let me know. Thanks

## 2018-01-01 ENCOUNTER — Telehealth: Payer: Self-pay

## 2018-01-01 ENCOUNTER — Telehealth: Payer: Self-pay | Admitting: *Deleted

## 2018-01-01 MED ORDER — HYDROCODONE-ACETAMINOPHEN 5-325 MG PO TABS
1.0000 | ORAL_TABLET | ORAL | 0 refills | Status: AC | PRN
Start: 2018-01-01 — End: 2018-01-31

## 2018-01-01 MED ORDER — HYDROCODONE-ACETAMINOPHEN 5-325 MG PO TABS: 1.0000 | ORAL_TABLET | ORAL | 0 refills | Status: DC | PRN

## 2018-01-01 NOTE — Telephone Encounter (Signed)
DME order for nebulizer machine printed and given to Laverle Patter, RN, BSN

## 2018-01-01 NOTE — Telephone Encounter (Signed)
Call from Charlean Sanfilippo, [place]- requesting verbal order for "RN visit to eval pt", PT stated pt has pressure ulcers on both heels. VO given - if not appropriate, let me know. Also she stated pt has not been wearing her oxygen but her O2 sats have been staying up. Thanks

## 2018-01-01 NOTE — Telephone Encounter (Signed)
Daughter, [name], notified that orders for [facility] and nebulizer machine have been placed. Daughter states patient is out of pain med and would like refill sent to [facility] on [address]. Hubbard Hartshorn, RN, BSN

## 2018-01-01 NOTE — Telephone Encounter (Signed)
Agree 

## 2018-01-01 NOTE — Addendum Note (Signed)
Addended by: Velora Heckler on: 01/01/2018 04:15 PM   Modules accepted: Orders

## 2018-01-01 NOTE — Telephone Encounter (Signed)
Daughter notified that Rx has been sent. Hubbard Hartshorn, RN, BSN

## 2018-01-01 NOTE — Telephone Encounter (Signed)
Okay.  That is reasonable.  Hope RN checks her O2 saturation too.  Thank you!

## 2018-01-01 NOTE — Telephone Encounter (Signed)
Faxed DME to [facility].

## 2018-01-01 NOTE — Telephone Encounter (Signed)
Rx sent 

## 2018-01-01 NOTE — Telephone Encounter (Signed)
I believe I can place the orders because I have seen her in the last 6 months (11/02/17)  Will place and get her started on home health.  I will do PT/OT and RN to start as I don't know what the recommendations were from the nursing home.    Gilles Chiquito, MD

## 2018-01-01 NOTE — Addendum Note (Signed)
Addended by: Gilles Chiquito B on: 01/01/2018 04:41 PM   Modules accepted: Orders

## 2018-01-02 ENCOUNTER — Emergency Department (HOSPITAL_COMMUNITY): Payer: Medicare Other

## 2018-01-02 ENCOUNTER — Other Ambulatory Visit: Payer: Self-pay

## 2018-01-02 ENCOUNTER — Encounter (HOSPITAL_COMMUNITY): Payer: Self-pay | Admitting: Emergency Medicine

## 2018-01-02 ENCOUNTER — Inpatient Hospital Stay (HOSPITAL_COMMUNITY)
Admission: EM | Admit: 2018-01-02 | Discharge: 2018-01-05 | DRG: 690 | Disposition: A | Discharge status: Hospice | Payer: Medicare Other | Attending: Internal Medicine | Admitting: Internal Medicine

## 2018-01-02 DIAGNOSIS — L89629 Pressure ulcer of left heel, unspecified stage: Secondary | ICD-10-CM | POA: Diagnosis not present

## 2018-01-02 DIAGNOSIS — J449 Chronic obstructive pulmonary disease, unspecified: Secondary | ICD-10-CM | POA: Diagnosis present

## 2018-01-02 DIAGNOSIS — L89610 Pressure ulcer of right heel, unstageable: Secondary | ICD-10-CM | POA: Diagnosis present

## 2018-01-02 DIAGNOSIS — D649 Anemia, unspecified: Secondary | ICD-10-CM | POA: Diagnosis not present

## 2018-01-02 DIAGNOSIS — T451X5D Adverse effect of antineoplastic and immunosuppressive drugs, subsequent encounter: Secondary | ICD-10-CM | POA: Diagnosis not present

## 2018-01-02 DIAGNOSIS — Z66 Do not resuscitate: Secondary | ICD-10-CM | POA: Diagnosis present

## 2018-01-02 DIAGNOSIS — Z87891 Personal history of nicotine dependence: Secondary | ICD-10-CM | POA: Diagnosis not present

## 2018-01-02 DIAGNOSIS — R51 Headache: Secondary | ICD-10-CM | POA: Diagnosis not present

## 2018-01-02 DIAGNOSIS — L84 Corns and callosities: Secondary | ICD-10-CM | POA: Diagnosis not present

## 2018-01-02 DIAGNOSIS — Z681 Body mass index (BMI) 19 or less, adult: Secondary | ICD-10-CM

## 2018-01-02 DIAGNOSIS — B961 Klebsiella pneumoniae [K. pneumoniae] as the cause of diseases classified elsewhere: Secondary | ICD-10-CM | POA: Diagnosis not present

## 2018-01-02 DIAGNOSIS — L8990 Pressure ulcer of unspecified site, unspecified stage: Secondary | ICD-10-CM

## 2018-01-02 DIAGNOSIS — M2550 Pain in unspecified joint: Secondary | ICD-10-CM | POA: Diagnosis not present

## 2018-01-02 DIAGNOSIS — R519 Headache, unspecified: Secondary | ICD-10-CM

## 2018-01-02 DIAGNOSIS — Z515 Encounter for palliative care: Secondary | ICD-10-CM | POA: Diagnosis present

## 2018-01-02 DIAGNOSIS — F419 Anxiety disorder, unspecified: Secondary | ICD-10-CM | POA: Diagnosis present

## 2018-01-02 DIAGNOSIS — I951 Orthostatic hypotension: Secondary | ICD-10-CM | POA: Diagnosis not present

## 2018-01-02 DIAGNOSIS — Z86718 Personal history of other venous thrombosis and embolism: Secondary | ICD-10-CM

## 2018-01-02 DIAGNOSIS — J90 Pleural effusion, not elsewhere classified: Secondary | ICD-10-CM | POA: Diagnosis not present

## 2018-01-02 DIAGNOSIS — Z801 Family history of malignant neoplasm of trachea, bronchus and lung: Secondary | ICD-10-CM | POA: Diagnosis not present

## 2018-01-02 DIAGNOSIS — G620 Drug-induced polyneuropathy: Secondary | ICD-10-CM | POA: Diagnosis not present

## 2018-01-02 DIAGNOSIS — G629 Polyneuropathy, unspecified: Secondary | ICD-10-CM | POA: Diagnosis not present

## 2018-01-02 DIAGNOSIS — C3491 Malignant neoplasm of unspecified part of right bronchus or lung: Secondary | ICD-10-CM | POA: Diagnosis present

## 2018-01-02 DIAGNOSIS — Z87440 Personal history of urinary (tract) infections: Secondary | ICD-10-CM

## 2018-01-02 DIAGNOSIS — R5381 Other malaise: Secondary | ICD-10-CM | POA: Diagnosis not present

## 2018-01-02 DIAGNOSIS — D539 Nutritional anemia, unspecified: Secondary | ICD-10-CM | POA: Diagnosis not present

## 2018-01-02 DIAGNOSIS — H5500 Unspecified nystagmus: Secondary | ICD-10-CM | POA: Diagnosis not present

## 2018-01-02 DIAGNOSIS — Z8672 Personal history of thrombophlebitis: Secondary | ICD-10-CM

## 2018-01-02 DIAGNOSIS — R609 Edema, unspecified: Secondary | ICD-10-CM | POA: Diagnosis not present

## 2018-01-02 DIAGNOSIS — Z7902 Long term (current) use of antithrombotics/antiplatelets: Secondary | ICD-10-CM | POA: Diagnosis not present

## 2018-01-02 DIAGNOSIS — R252 Cramp and spasm: Secondary | ICD-10-CM

## 2018-01-02 DIAGNOSIS — E785 Hyperlipidemia, unspecified: Secondary | ICD-10-CM | POA: Diagnosis present

## 2018-01-02 DIAGNOSIS — I129 Hypertensive chronic kidney disease with stage 1 through stage 4 chronic kidney disease, or unspecified chronic kidney disease: Secondary | ICD-10-CM | POA: Diagnosis present

## 2018-01-02 DIAGNOSIS — N390 Urinary tract infection, site not specified: Secondary | ICD-10-CM | POA: Diagnosis present

## 2018-01-02 DIAGNOSIS — D72829 Elevated white blood cell count, unspecified: Secondary | ICD-10-CM | POA: Diagnosis not present

## 2018-01-02 DIAGNOSIS — E876 Hypokalemia: Secondary | ICD-10-CM | POA: Diagnosis not present

## 2018-01-02 DIAGNOSIS — I10 Essential (primary) hypertension: Secondary | ICD-10-CM | POA: Diagnosis not present

## 2018-01-02 DIAGNOSIS — R200 Anesthesia of skin: Secondary | ICD-10-CM | POA: Diagnosis not present

## 2018-01-02 DIAGNOSIS — L97419 Non-pressure chronic ulcer of right heel and midfoot with unspecified severity: Secondary | ICD-10-CM | POA: Diagnosis not present

## 2018-01-02 DIAGNOSIS — N183 Chronic kidney disease, stage 3 (moderate): Secondary | ICD-10-CM | POA: Diagnosis present

## 2018-01-02 DIAGNOSIS — Z7189 Other specified counseling: Secondary | ICD-10-CM | POA: Diagnosis not present

## 2018-01-02 DIAGNOSIS — Z8673 Personal history of transient ischemic attack (TIA), and cerebral infarction without residual deficits: Secondary | ICD-10-CM

## 2018-01-02 DIAGNOSIS — Z7401 Bed confinement status: Secondary | ICD-10-CM | POA: Diagnosis not present

## 2018-01-02 DIAGNOSIS — G5793 Unspecified mononeuropathy of bilateral lower limbs: Secondary | ICD-10-CM | POA: Diagnosis not present

## 2018-01-02 DIAGNOSIS — R531 Weakness: Secondary | ICD-10-CM | POA: Diagnosis not present

## 2018-01-02 DIAGNOSIS — R509 Fever, unspecified: Secondary | ICD-10-CM | POA: Diagnosis not present

## 2018-01-02 DIAGNOSIS — T451X5A Adverse effect of antineoplastic and immunosuppressive drugs, initial encounter: Secondary | ICD-10-CM | POA: Diagnosis not present

## 2018-01-02 DIAGNOSIS — L97429 Non-pressure chronic ulcer of left heel and midfoot with unspecified severity: Secondary | ICD-10-CM | POA: Diagnosis not present

## 2018-01-02 LAB — CBC WITH DIFFERENTIAL/PLATELET
Abs Immature Granulocytes: 0 10*3/uL (ref 0.0–0.1)
BASOS ABS: 0.1 10*3/uL (ref 0.0–0.1)
BASOS PCT: 1 %
EOS ABS: 0.2 10*3/uL (ref 0.0–0.7)
Eosinophils Relative: 2 %
HCT: 26 % — ABNORMAL LOW (ref 36.0–46.0)
Hemoglobin: 7.7 g/dL — ABNORMAL LOW (ref 12.0–15.0)
Immature Granulocytes: 0 %
Lymphocytes Relative: 27 %
Lymphs Abs: 2.7 10*3/uL (ref 0.7–4.0)
MCH: 30 pg (ref 26.0–34.0)
MCHC: 29.6 g/dL — AB (ref 30.0–36.0)
MCV: 101.2 fL — ABNORMAL HIGH (ref 78.0–100.0)
MONO ABS: 1 10*3/uL (ref 0.1–1.0)
Monocytes Relative: 10 %
Neutro Abs: 5.9 10*3/uL (ref 1.7–7.7)
Neutrophils Relative %: 60 %
PLATELETS: 332 10*3/uL (ref 150–400)
RBC: 2.57 MIL/uL — ABNORMAL LOW (ref 3.87–5.11)
RDW: 19.3 % — ABNORMAL HIGH (ref 11.5–15.5)
WBC: 9.9 10*3/uL (ref 4.0–10.5)

## 2018-01-02 LAB — COMPREHENSIVE METABOLIC PANEL
ALK PHOS: 107 U/L (ref 38–126)
ALT: 15 U/L (ref 0–44)
ANION GAP: 10 (ref 5–15)
AST: 17 U/L (ref 15–41)
Albumin: 2.2 g/dL — ABNORMAL LOW (ref 3.5–5.0)
BUN: 17 mg/dL (ref 8–23)
CALCIUM: 8.7 mg/dL — AB (ref 8.9–10.3)
CHLORIDE: 110 mmol/L (ref 98–111)
CO2: 25 mmol/L (ref 22–32)
Creatinine, Ser: 1.99 mg/dL — ABNORMAL HIGH (ref 0.44–1.00)
GFR calc non Af Amer: 23 mL/min — ABNORMAL LOW (ref >60)
GFR, EST AFRICAN AMERICAN: 27 mL/min — AB (ref >60)
Glucose, Bld: 108 mg/dL — ABNORMAL HIGH (ref 70–99)
Potassium: 3 mmol/L — ABNORMAL LOW (ref 3.5–5.1)
SODIUM: 145 mmol/L (ref 135–145)
Total Bilirubin: 0.7 mg/dL (ref 0.3–1.2)
Total Protein: 6 g/dL — ABNORMAL LOW (ref 6.5–8.1)

## 2018-01-02 LAB — CK: Total CK: 21 U/L — ABNORMAL LOW (ref 38–234)

## 2018-01-02 LAB — URINALYSIS, ROUTINE W REFLEX MICROSCOPIC
Bilirubin Urine: NEGATIVE
GLUCOSE, UA: NEGATIVE mg/dL
Hgb urine dipstick: NEGATIVE
KETONES UR: NEGATIVE mg/dL
Nitrite: POSITIVE — AB
PH: 5 (ref 5.0–8.0)
Protein, ur: 30 mg/dL — AB
SPECIFIC GRAVITY, URINE: 1.017 (ref 1.005–1.030)
WBC, UA: >50 WBC/hpf — ABNORMAL HIGH (ref 0–5)

## 2018-01-02 LAB — BRAIN NATRIURETIC PEPTIDE: B Natriuretic Peptide: 104.1 pg/mL — ABNORMAL HIGH (ref 0.0–100.0)

## 2018-01-02 LAB — TSH: TSH: 3.75 u[IU]/mL (ref 0.350–4.500)

## 2018-01-02 LAB — HEMOGLOBIN A1C
HEMOGLOBIN A1C: 5.2 % (ref 4.8–5.6)
MEAN PLASMA GLUCOSE: 102.54 mg/dL

## 2018-01-02 LAB — TROPONIN I

## 2018-01-02 LAB — VITAMIN B12: Vitamin B-12: 2111 pg/mL — ABNORMAL HIGH (ref 180–914)

## 2018-01-02 MED ORDER — KETOROLAC TROMETHAMINE 30 MG/ML IJ SOLN: 15.0000 mg | Freq: Once | INTRAMUSCULAR | Status: DC

## 2018-01-02 MED ORDER — GABAPENTIN 300 MG PO CAPS
300.0000 mg | ORAL_CAPSULE | Freq: Three times a day (TID) | ORAL | Status: DC
  Administered 2018-01-02 – 2018-01-05 (×10): 300 mg via ORAL
  Filled 2018-01-02 (×10): qty 1

## 2018-01-02 MED ORDER — HEPARIN SODIUM (PORCINE) 5000 UNIT/ML IJ SOLN
5000.0000 [IU] | Freq: Three times a day (TID) | INTRAMUSCULAR | Status: DC
  Administered 2018-01-02 – 2018-01-04 (×2): 5000 [IU] via SUBCUTANEOUS
  Filled 2018-01-02 (×5): qty 1

## 2018-01-02 MED ORDER — CLOPIDOGREL BISULFATE 75 MG PO TABS
75.0000 mg | ORAL_TABLET | Freq: Every day | ORAL | Status: DC
  Administered 2018-01-02 – 2018-01-05 (×4): 75 mg via ORAL
  Filled 2018-01-02 (×4): qty 1

## 2018-01-02 MED ORDER — ALPRAZOLAM 0.25 MG PO TABS: 0.2500 mg | ORAL_TABLET | Freq: Two times a day (BID) | ORAL | Status: DC | PRN

## 2018-01-02 MED ORDER — SODIUM CHLORIDE 0.9 % IV SOLN
8.0000 mg | Freq: Three times a day (TID) | INTRAVENOUS | Status: DC | PRN
  Filled 2018-01-02: qty 4

## 2018-01-02 MED ORDER — ENSURE ENLIVE PO LIQD
237.0000 mL | Freq: Two times a day (BID) | ORAL | Status: DC
  Administered 2018-01-02: 237 mL via ORAL

## 2018-01-02 MED ORDER — PRO-STAT SUGAR FREE PO LIQD
30.0000 mL | Freq: Two times a day (BID) | ORAL | Status: DC
  Administered 2018-01-03 (×2): 30 mL via ORAL
  Filled 2018-01-02 (×5): qty 30

## 2018-01-02 MED ORDER — SODIUM CHLORIDE 0.9 % IV SOLN
1.0000 g | Freq: Once | INTRAVENOUS | Status: AC
  Administered 2018-01-02: 1 g via INTRAVENOUS
  Filled 2018-01-02: qty 10

## 2018-01-02 MED ORDER — HYDROCODONE-ACETAMINOPHEN 5-325 MG PO TABS
1.0000 | ORAL_TABLET | ORAL | Status: DC | PRN
  Administered 2018-01-02 – 2018-01-04 (×2): 1 via ORAL
  Filled 2018-01-02 (×2): qty 1

## 2018-01-02 MED ORDER — KETOROLAC TROMETHAMINE 60 MG/2ML IM SOLN
30.0000 mg | Freq: Once | INTRAMUSCULAR | Status: AC
  Administered 2018-01-02: 30 mg via INTRAMUSCULAR
  Filled 2018-01-02: qty 2

## 2018-01-02 MED ORDER — ENSURE ENLIVE PO LIQD
237.0000 mL | Freq: Three times a day (TID) | ORAL | Status: DC
  Administered 2018-01-02 – 2018-01-05 (×7): 237 mL via ORAL

## 2018-01-02 MED ORDER — POLYETHYLENE GLYCOL 3350 17 G PO PACK
17.0000 g | PACK | Freq: Every day | ORAL | Status: DC
  Administered 2018-01-03 – 2018-01-05 (×3): 17 g via ORAL
  Filled 2018-01-02 (×4): qty 1

## 2018-01-02 MED ORDER — POTASSIUM CHLORIDE CRYS ER 20 MEQ PO TBCR
40.0000 meq | EXTENDED_RELEASE_TABLET | Freq: Once | ORAL | Status: AC
  Administered 2018-01-02: 40 meq via ORAL
  Filled 2018-01-02: qty 2

## 2018-01-02 MED ORDER — SODIUM CHLORIDE 0.9 % IV BOLUS
500.0000 mL | Freq: Once | INTRAVENOUS | Status: AC
  Administered 2018-01-02: 500 mL via INTRAVENOUS

## 2018-01-02 NOTE — Progress Notes (Signed)
Attending resident paged, requesting IV push Zofran, awaiting response.

## 2018-01-02 NOTE — Progress Notes (Signed)
Initial Nutrition Assessment  DOCUMENTATION CODES:  Non-severe (moderate) malnutrition in context of chronic illness, Underweight  INTERVENTION:  Ensure Enlive po TID, each supplement provides 350 kcal and 20 grams of protein  Will order 30 mL Prostat BID, each supplement provides 100 kcal and 15 grams of protein.  Appreciate nursing assistance w/ feeding set up  NUTRITION DIAGNOSIS:  Moderate Malnutrition related to limited prior education, chronic illness as evidenced by loss of ~10% bw x2 months and moderate muscle/fat wasting.   GOAL:  Patient will meet greater than or equal to 90% of their needs  MONITOR:  PO intake, Supplement acceptance, Labs, Weight trends, I & O's  REASON FOR ASSESSMENT:  Consult Assessment of nutrition requirement/status  ASSESSMENT:  77 y/o Female PMHx CVA, CKD3, PAD, Blind, Very HOH, Stage IV NSCLC (just dx in June) s/p chemo x1, not candidate for continued cancer tx. Presents w/ BLE numbness and pain. Admitted for further evaluation.   Limited ability to take hx due to HOH/blindness. Despite the fact that she has been loseing weight, she says she has been eating well since returning from rehab. She notes she drinks Ensure at home. Nursing notes the patient ate poorly at [facility].   Patient unable to report a normal weight range, but notes she has never weighed very much. However, her chart shows she actually was obese, at a weight of 170-180 lbs <5years ago. More short term, she was 141 lbs at this time last year and 127 lbs 2 months ago. Currently at 113 lbs, she has lost ~10% bw in just 2 months.   Given that patients reports she is eating well, her loss is likely related to cancer related cachexia/impaired anabolism vs being exceptionally hypermetabolic.   Though she is followed by palliative care. She is currently listed as full code and MD notes she wanted to have 'everything to keep me alive except for chemo'. As such, will supplement aggressively.  Ensure TID + Prostat Bid. . Appreciate nursing helping pt w/ set up feeding.   Labs: Albumin: 2.2, H/H:7.7/26, Creat: 1.99 (2.29 1 mo ago), A1C: 5.2. K:3.0 Meds: Ensure Enlive BID, Miralax, IV abx  Recent Labs  Lab 01/02/18 0627  NA 145  K 3.0*  CL 110  CO2 25  BUN 17  CREATININE 1.99*  CALCIUM 8.7*  GLUCOSE 108*   NUTRITION - FOCUSED PHYSICAL EXAM:   Most Recent Value  Orbital Region  Moderate depletion  Upper Arm Region  Mild depletion  Thoracic and Lumbar Region  Moderate depletion  Buccal Region  Moderate depletion  Temple Region  Moderate depletion  Clavicle Bone Region  Mild depletion  Clavicle and Acromion Bone Region  Mild depletion  Scapular Bone Region  Mild depletion  Dorsal Hand  Moderate depletion  Patellar Region  No depletion  Anterior Thigh Region  No depletion  Posterior Calf Region  No depletion  Edema (RD Assessment)  -- [Generalized]  Hair  Reviewed  Eyes  Reviewed  Mouth  Reviewed  Skin  Reviewed  Nails  Reviewed     Diet Order:   Diet Order            Diet regular Room service appropriate? Yes; Fluid consistency: Thin  Diet effective now             EDUCATION NEEDS:  Not appropriate for education at this time  Skin:Unstageable wounds to bilateral heals   Last BM:  8/23  Height:  Ht Readings from Last 1 Encounters:  01/02/18  5\' 6"  (1.676 m)   Weight:  Wt Readings from Last 1 Encounters:  01/02/18 51.3 kg   Wt Readings from Last 10 Encounters:  01/02/18 51.3 kg  11/25/17 60.5 kg  11/23/17 57.6 kg  11/22/17 57.6 kg  11/21/17 57.6 kg  11/15/17 57.6 kg  11/03/17 57.8 kg  11/02/17 56.7 kg  11/02/17 56.8 kg  10/28/17 58.6 kg   Ideal Body Weight:  59.1 kg  BMI:  Body mass index is 18.24 kg/m.  Estimated Nutritional Needs:  Kcal:  1800-2050 kcals/d (35-40 kcal/kg bw) Protein:  92-103 g Pro (1.8-2g/kg bw) Fluid:  >1.3 L fluid (25 ml/kg bw)  Burtis Junes RD, LDN, CNSC Clinical Nutrition Available Tues-Sat via Pager:  [phone] 01/02/2018 5:52 PM

## 2018-01-02 NOTE — Evaluation (Signed)
Physical Therapy Evaluation Patient Details Name: [name] MRN: [id number] DOB: 05-24-1940 Today's Date: 01/02/2018   History of Present Illness  77 y.o. female with recently diagnosed non-small cell lung cancer had chemotherapy last week he was complaining of abdominal pain burning sensation with diarrhea.  Admitted with sepsis likely from UTI and possible pneumonia  Clinical Impression  Pt admitted with above diagnosis. Pt currently with functional limitations due to the deficits listed below (see PT Problem List). At the time of PT eval pt was able to perform transfers and minimal ambulation with RW and min assist (+2 for safety). Pt reporting she was essentially not ambulating at rehab (SNF) and BLE pain progressively increasing from rehab stay to home. It seems that the patient is motivated to become ambulatory again and care for herself, and feel she can meet these goals with consistent physical and occupational therapies. PT recommending a SNF setting for short term rehab. Acutely, pt will benefit from skilled PT to increase their independence and safety with mobility to allow discharge to the venue listed below.       Follow Up Recommendations SNF;Supervision/Assistance - 24 hour    Equipment Recommendations  None recommended by PT(TBD by next venue of care)    Recommendations for Other Services       Precautions / Restrictions Precautions Precautions: Fall Precaution Comments: Pt extremely HOH and legally blind Restrictions Weight Bearing Restrictions: No      Mobility  Bed Mobility Overal bed mobility: Needs Assistance Bed Mobility: Supine to Sit     Supine to sit: Supervision     General bed mobility comments: HOB elevated. Pt was able to transition to EOB with close supervision for safety.   Transfers Overall transfer level: Needs assistance Equipment used: None Transfers: Stand Pivot Transfers   Stand pivot transfers: Min assist;+2 safety/equipment        General transfer comment: Min assist for balance support and safety as pt transferred bed>BSC. Second person utilized to hold [facility] steady   Ambulation/Gait Ambulation/Gait assistance: Equities trader (Feet): 5 Feet Assistive device: Rolling walker (2 wheeled) Gait Pattern/deviations: Shuffle;Trunk flexed;Narrow base of support Gait velocity: Decreased Gait velocity interpretation: <1.8 ft/sec, indicate of risk for recurrent falls General Gait Details: Pt was able to ambulate a short distance with RW for support. Upon initial stand from [facility] pt suddenly sat back down and states her legs gave out on her. She also states her legs were giving out on her as she was ambulating to the chair, however no sudden buckle was noted.   Stairs            Wheelchair Mobility    Modified Rankin (Stroke Patients Only)       Balance Overall balance assessment: Needs assistance Sitting-balance support: Feet supported;No upper extremity supported Sitting balance-Leahy Scale: Fair     Standing balance support: Bilateral upper extremity supported;During functional activity Standing balance-Leahy Scale: Poor Standing balance comment: BUE support required for all standing activity.                              Pertinent Vitals/Pain      Home Living Family/patient expects to be discharged to:: Private residence Living Arrangements: Alone Available Help at Discharge: Family;Personal care attendant;Available PRN/intermittently(old notes about pt having an aide M-F in am) Type of Home: Apartment Home Access: Level entry     Home Layout: One level Home Equipment: Gilford Rile -  4 wheels Additional Comments: Information gathered from a recent admission (11/27/17) - pt a poor historian and unable to provide a meaningful history.     Prior Function Level of Independence: Needs assistance   Gait / Transfers Assistance Needed: Pt states that she has not  walked since before she went to rehab. She states at rehab she was in the wheelchair mostly and was not walking.            Hand Dominance        Extremity/Trunk Assessment   Upper Extremity Assessment Upper Extremity Assessment: Generalized weakness    Lower Extremity Assessment Lower Extremity Assessment: Generalized weakness;RLE deficits/detail;LLE deficits/detail RLE Deficits / Details: BLE's grossly 4/5 in strength with MMT of hip flexors, quads, and hamstrings. Pt described decreased sensation from the knee down to her feet bilaterally. Initially stating BLE's were numb, however upon further testing it appears it is decreased light touch. RLE Sensation: decreased light touch LLE Sensation: decreased light touch    Cervical / Trunk Assessment Cervical / Trunk Assessment: Kyphotic  Communication   Communication: HOH  Cognition Arousal/Alertness: Awake/alert Behavior During Therapy: Anxious Overall Cognitive Status: Difficult to assess                                        General Comments      Exercises     Assessment/Plan    PT Assessment Patient needs continued PT services  PT Problem List Decreased strength;Decreased range of motion;Decreased activity tolerance;Decreased balance;Decreased mobility;Decreased knowledge of use of DME;Decreased safety awareness;Decreased knowledge of precautions;Pain       PT Treatment Interventions DME instruction;Gait training;Stair training;Functional mobility training;Therapeutic activities;Therapeutic exercise;Neuromuscular re-education;Patient/family education    PT Goals (Current goals can be found in the Care Plan section)  Acute Rehab PT Goals Patient Stated Goal: "I want to walk again" PT Goal Formulation: With patient Time For Goal Achievement: 01/09/18 Potential to Achieve Goals: Good    Frequency Min 2X/week   Barriers to discharge        Co-evaluation               AM-PAC PT "6  Clicks" Daily Activity  Outcome Measure Difficulty turning over in bed (including adjusting bedclothes, sheets and blankets)?: A Little Difficulty moving from lying on back to sitting on the side of the bed? : A Little Difficulty sitting down on and standing up from a chair with arms (e.g., wheelchair, bedside commode, etc,.)?: A Little Help needed moving to and from a bed to chair (including a wheelchair)?: A Little Help needed walking in hospital room?: A Little Help needed climbing 3-5 steps with a railing? : Total 6 Click Score: 16    End of Session Equipment Utilized During Treatment: Gait belt Activity Tolerance: Patient limited by fatigue Patient left: in chair;with call bell/phone within reach;with chair alarm set Nurse Communication: Mobility status PT Visit Diagnosis: Unsteadiness on feet (R26.81);Pain;Difficulty in walking, not elsewhere classified (R26.2);Muscle weakness (generalized) (M62.81) Pain - part of body: Leg(Bilateral)    Time: 8341-9622 PT Time Calculation (min) (ACUTE ONLY): 18 min   Charges:   PT Evaluation $PT Eval Moderate Complexity: 1 Mod          Rolinda Roan, PT, DPT Acute Rehabilitation Services Pager: [phone]   Thelma Comp 01/02/2018, 2:24 PM

## 2018-01-02 NOTE — Plan of Care (Signed)
  Problem: Activity: Goal: Risk for activity intolerance will decrease Outcome: Progressing   Problem: Nutrition: Goal: Adequate nutrition will be maintained Outcome: Progressing   Problem: Coping: Goal: Level of anxiety will decrease Outcome: Progressing   Problem: Safety: Goal: Ability to remain free from injury will improve Outcome: Progressing   

## 2018-01-02 NOTE — ED Notes (Signed)
Attempted to gain IV access x2, without success.

## 2018-01-02 NOTE — H&P (Signed)
Date: 01/02/2018               Patient Name:  [name] MRN: [id number]  DOB: 11/25/1940 Age / Sex: 77 y.o., female   PCP: Sid Falcon, MD         Medical Service: Internal Medicine Teaching Service         Attending Physician: Dr. Aldine Contes, MD    First Contact: Dr. Gilberto Better  Pager: [phone]  Second Contact: Dr. 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 She states her bilateral pain radiates from bottom of her feet up to her knees with associated numbness without swelling, warmth or erythema. She states her pain started soon after receiving her chemotherapy in July with associated numbness. She has been in and out of [place] and [place] for similar complaints as well as UTIs in the past but she feels no one has addressed her lower extremity pain. She states 'she just wants to walk.' She states the pain has not changed in quality, severity, or radiation. Her leg pains are not debiliting but she has not been able to walk due to the numbness. She states she feels distressed about having to rely on her son for all of her mobility needs.  On conversation with her daughter via phone [name] [phone]) she confirms that [name] has not been moving at all in the home. She states that ever since she came back at home after her discharge from rehab facility, the patient describes intermittent burning pain that is severe enough to cause her to cry. She states  the patient also endorsed some fatigue and light-headedness which seems new for her. Patient is also endorsing weight loss. Denies any F/N/V/D/C. Denies any dysuria, hematuria, frequency, urgency.  In the ED she was found to have negative trop x1, K of 3.0 which was repleted via 33mEq K-dur, and UA positive for leuk esterase and nitrites and + bacteria. She was started on 1g Ceftriaxone  Meds:  proventil 2 puffs PRN q6hrs Xanax 0.25mg  BID PRN for anxiety Plavix 75mg  daily Hydrocodone-acetaminophen 5-325 q4h PRN for pain Miralax 17g daily   Allergies: Allergies as of 01/02/2018 - Review Complete 01/02/2018  Allergen Reaction Noted  . Codeine Other (See Comments) 11/28/2009  . Morphine Other (See Comments) 11/28/2009   Past Medical History:  Diagnosis Date  . Anemia   . Arthritis    "left shoulder" (04/07/2016)  . Blind    s/p bilateral corneal transplant; "legally blind in both eyes" (04/07/2016)  . Cancer ([place])    lung  . CKD (chronic kidney disease), stage III ([place])   . COPD (chronic obstructive pulmonary disease) ([place])    pt denies  . DVT (deep venous thrombosis) ([place]) "years ago"   "right thigh"    . HEARING LOSS   . Hyperlipidemia   . Hypertension   . PAD (peripheral artery disease) ([place])    a. 03/2016 Periph Angio: L  SFA 99p, 7m w/ one vessel runoff. R SFA 152m w/ one vessel runofff;  b. 04/07/2016 PTA L SFA ([place] 1 LS atherecromty w/ DEBA). c. 04/24/16 s/p PTA to R SFA, right tibioperoneal trunk and posterior tibial artery.  . Phlebitis   . Squamous cell carcinoma lung ([place])   . Tobacco abuse     Family History: Colon cancer in maternal uncle. Liver cancer in mother and sister. Lung cancer in brother   Social History: She lives with her son at home. States her son helps her with most of her IADLs. Currently retired. 31 pack year smoking hx but quit in March of 2019. Denies EtOH, IVDU  Review of Systems: A complete ROS was negative except as per HPI.   Physical  Exam: Blood pressure (!) 162/91, pulse 77, temperature 97.8 F (36.6 C), temperature source Oral, resp. rate 16, SpO2 97 %. Physical Exam  Constitutional: She is oriented to person, place, and time. No distress.  Cachetic appearing female  HENT:  Hearing aids not in place  Eyes:  Bilateral nystagmus. Pupils show evidence of corneal transplant  Neck: Normal range of motion. Neck supple. No JVD present. No thyromegaly present.  Cardiovascular: Normal rate, regular rhythm, normal heart sounds and intact distal pulses.  Pulmonary/Chest:  Increased respiratory effort. Distant breath sounds throughout. Right upper portion absent breath sounds  Abdominal: Soft. Bowel sounds are normal. She exhibits no distension. There is no tenderness. There is no guarding.  Musculoskeletal: Normal range of motion. She exhibits no edema, tenderness or deformity.  Negative CVA tenderness  Neurological: She is alert and oriented to person, place, and time. No cranial nerve deficit.  Plantar flexion, Dorsiflexion bilateral feet strength 5/5. Lower extremity sensation decreased   Skin: Skin is warm and dry. No rash noted. She is not diaphoretic. No erythema.   CXR: personally reviewed my interpretation is Right upper lobe consolidation, Hemidiaphragms flattened. No pulmonary edema.  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 Her hx consistent with chemo induced peripheral neuropathy. She is followed by palliative but appears to not have complete understanding of her malignancy and long term goals. Currently she states she wants 'everything to keep me alive except for chemo' She would benefit from re-establishing goals of care during this admission.   Assessment & Plan by Problem: Lower  extremity pain and numbness 2/2 chemo induced peripheral neuropathy vs B12 deficiency vs diabetic neuropathy Presents with acute vs chronic bilateral numbness and pain of 2 month duration. Hx consistent with chemo induced peripheral neuropathy however no mentions of these symptoms during other ED visits since her chemo. No significant edema, warmth, redness. Physical exam not consistent with GBS. No hx of diabetes. Will provide supportive care and therapy and rule out other causes of peripheral neuropathy - Start gabapentin 300mg  TID - C/w home med: Hydrocodone-acetaminophen 5-325mg  - Get labs: TSH, A1c, B12  Macrocytic Anemia  Hx of pancytopenia secondary to chemo. CBC during last hospitalization in 11/2017 shows normocytic anemia at discharge. Hx of blood transfusion in clinic on 12/03/17 - Check B12, replenish if low -  Recheck tomorrow AM  Asymptomatic Bacteriuria No symptoms per history. Physical exam negative for abdominal or CVA tenderness. No fevers or flank pain - STOP Ceftriaxone  Stage 4 Squamous  Cell Carcinoma Hx of p63+ squamous cell carcinoma diagnosed in 10/2017 s/p 1 round of chemo with carboplatin, paclitaxel and pembrolizumab. Unable to tolerate chemo and admitted couple days after chemo on 11/24/17 for pneumonia. Oncology deemed her not candidate for further chemo. Palliative had been following since that hospitalization. Next appt with outpatient palliative on 8/30th - Consult to palliative care  CKD stage 3 At baseline creatinine 1.99  Hx of Right Cerebellar CVA - c/w home med: Plavix 75mg  daily  Hx of Anxiety - C/w home med: alprazolam 0.25mg  BID PRN for anxiety  DVT prophx: Heparin Diet: Regular Code: FULL  Dispo: Admit patient to Observation with expected length of stay less than 2 midnights.  Signed: Mosetta Anis, MD 01/02/2018, 9:37 AM  Pager: [phone]

## 2018-01-02 NOTE — ED Triage Notes (Signed)
Pt arrived [facility] from home with c/o BLE edema since July. Per EMS pt has hx of lung ca and was treated with one round of chemo in July and her doctors told her that another round would not be appropriate. Pt has since had BLE edema. Vitals with EMS BP158/74 P86 O299%

## 2018-01-02 NOTE — Plan of Care (Signed)
  Problem: Nutrition: Goal: Adequate nutrition will be maintained Outcome: Progressing   Problem: Coping: Goal: Level of anxiety will decrease Outcome: Progressing   Problem: Pain Managment: Goal: General experience of comfort will improve Outcome: Progressing   Problem: Safety: Goal: Ability to remain free from injury will improve Outcome: Progressing

## 2018-01-02 NOTE — ED Notes (Signed)
Admitting at bedside 

## 2018-01-02 NOTE — ED Notes (Signed)
Internal medicine at bedside
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
## 2018-01-02 NOTE — Progress Notes (Signed)
PT Cancellation Note  Patient Details Name: [name] MRN: [id number] DOB: 1940-08-31   Cancelled Treatment:    Reason Eval/Treat Not Completed: Patient at procedure or test/unavailable. Staff present to assess pt at this time, and pt currently on bedpan as well. Will check back as schedule allows to initiate PT eval.    Thelma Comp 01/02/2018, 11:58 AM   Rolinda Roan, PT, DPT Acute Rehabilitation Services Pager: [phone]

## 2018-01-03 DIAGNOSIS — L84 Corns and callosities: Secondary | ICD-10-CM

## 2018-01-03 DIAGNOSIS — G5793 Unspecified mononeuropathy of bilateral lower limbs: Secondary | ICD-10-CM

## 2018-01-03 DIAGNOSIS — R200 Anesthesia of skin: Secondary | ICD-10-CM

## 2018-01-03 DIAGNOSIS — G620 Drug-induced polyneuropathy: Secondary | ICD-10-CM

## 2018-01-03 DIAGNOSIS — T451X5A Adverse effect of antineoplastic and immunosuppressive drugs, initial encounter: Secondary | ICD-10-CM

## 2018-01-03 LAB — CBC WITH DIFFERENTIAL/PLATELET
Abs Immature Granulocytes: 0 10*3/uL (ref 0.0–0.1)
Basophils Absolute: 0.1 10*3/uL (ref 0.0–0.1)
Basophils Relative: 1 %
EOS ABS: 0.2 10*3/uL (ref 0.0–0.7)
Eosinophils Relative: 3 %
HEMATOCRIT: 22.1 % — AB (ref 36.0–46.0)
HEMOGLOBIN: 6.4 g/dL — AB (ref 12.0–15.0)
IMMATURE GRANULOCYTES: 1 %
LYMPHS ABS: 2.7 10*3/uL (ref 0.7–4.0)
LYMPHS PCT: 31 %
MCH: 29.4 pg (ref 26.0–34.0)
MCHC: 29 g/dL — ABNORMAL LOW (ref 30.0–36.0)
MCV: 101.4 fL — AB (ref 78.0–100.0)
MONOS PCT: 12 %
Monocytes Absolute: 1 10*3/uL (ref 0.1–1.0)
NEUTROS PCT: 54 %
Neutro Abs: 4.7 10*3/uL (ref 1.7–7.7)
Platelets: 270 10*3/uL (ref 150–400)
RBC: 2.18 MIL/uL — AB (ref 3.87–5.11)
RDW: 18.9 % — AB (ref 11.5–15.5)
WBC: 8.8 10*3/uL (ref 4.0–10.5)

## 2018-01-03 LAB — BASIC METABOLIC PANEL
Anion gap: 8 (ref 5–15)
BUN: 23 mg/dL (ref 8–23)
CHLORIDE: 114 mmol/L — AB (ref 98–111)
CO2: 22 mmol/L (ref 22–32)
CREATININE: 1.64 mg/dL — AB (ref 0.44–1.00)
Calcium: 8 mg/dL — ABNORMAL LOW (ref 8.9–10.3)
GFR calc non Af Amer: 29 mL/min — ABNORMAL LOW (ref >60)
GFR, EST AFRICAN AMERICAN: 34 mL/min — AB (ref >60)
Glucose, Bld: 101 mg/dL — ABNORMAL HIGH (ref 70–99)
POTASSIUM: 4.2 mmol/L (ref 3.5–5.1)
Sodium: 144 mmol/L (ref 135–145)

## 2018-01-03 LAB — PREPARE RBC (CROSSMATCH)

## 2018-01-03 MED ORDER — LIP MEDEX EX OINT
TOPICAL_OINTMENT | CUTANEOUS | Status: DC | PRN
  Filled 2018-01-03: qty 7

## 2018-01-03 MED ORDER — SODIUM CHLORIDE 0.9% IV SOLUTION: Freq: Once | INTRAVENOUS | Status: DC

## 2018-01-03 MED ORDER — ONDANSETRON 4 MG PO TBDP: 4.0000 mg | ORAL_TABLET | Freq: Four times a day (QID) | ORAL | Status: DC | PRN

## 2018-01-03 MED ORDER — ONDANSETRON HCL 4 MG/2ML IJ SOLN: 4.0000 mg | Freq: Four times a day (QID) | INTRAMUSCULAR | Status: DC | PRN

## 2018-01-03 NOTE — Progress Notes (Signed)
Patient refuses heparin injection, spoke with provider. SCD ordered. Continue to monitor.

## 2018-01-03 NOTE — Plan of Care (Signed)
  Problem: Nutrition: Goal: Adequate nutrition will be maintained Outcome: Progressing   Problem: Pain Managment: Goal: General experience of comfort will improve Outcome: Progressing   

## 2018-01-03 NOTE — Consult Note (Addendum)
Consultation Note Date: 01/03/2018   Patient Name: [name]  DOB: 05/26/1940  MRN: [id number]  Age / Sex: 77 y.o., female  PCP: Dana Falcon, MD Referring Physician: Aldine Contes, MD  Reason for Consultation: Establishing goals of care and Psychosocial/spiritual support  HPI/Patient Profile: 77 y.o. 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  Patient was found to have a UTI.  She was also found to be very anemic, with a hemoglobin of 6.4 and underwent transfusion.  Consult ordered for goals of care.  [name] has been seen by the palliative medicine team during  hospitalization in July 2019.  At that point, family wanted to pursue skilled nursing facility with rehab, with the goal of getting stronger to return home     Clinical Assessment and Goals of Care: Patient seen, chart reviewed.  Staffed with nursing.  Patient is napping this afternoon but did awaken easily to voice and requested that I visit another time so she could return to her nap.  I reached out to her daughter, [name].  [name] and her brother [name], are very active in their mother's life and have been present for other goals of care meetings.  Patient has also been followed by community-based palliative care provider, [name], during recent rehab stay at skilled nursing facility.  Patient is able to speak for herself.  Her healthcare proxy's in the event she were unable to, are her son, [name] at [facility] [place]; daughter, [name] at [phone]  Per [name], patient  has elected DNR status.  Her chief concern at this point is providing comfort and care for her mother.  She confirms that upon discharge, her mother will come back home where she will be cared for by both herself as well as her brother, [name].  [name] also is questioning "what are we doing about her mother's leg pain".  We discussed the role of gabapentin, which she has been prescribed at 300 mg 3 times a day to treat neuropathic pain. This is  likely a side effect from chemotherapy.  I did explain to her that unfortunately nerve damage cannot be cured but pain can be managed.  Her daughter is hopeful that if her pain is better managed she can be more functional at home   Discussed hospice services in the home after discharge and [name] is interested in that support    SUMMARY OF RECOMMENDATIONS   We will change CODE STATUS to DNR based on conversations with patient's daughter, [name].  Reviewed community-based palliative care provider, [name], outpatient notes and they reflect DNR status as well.  Hardcopy filled out and placed on patient's chart Will place order to case management to help  facilitate hospice referral and support in the home Pt is not full comfort care. Family would pursue further transfusion as well as abx, etc if needed during this hospitalization Palliative medicine plans to meet with family on 01/04/2018 at 1 PM for any follow-up concerns Code Status/Advance Care Planning:  DNR    Symptom Management:   Neuropathic pain: Continue with gabapentin 300 mg 3 times a day as well as Vicodin for breakthrough pain.  Monitor for need for up titration of dose  Palliative Prophylaxis:   Aspiration, Bowel Regimen, Delirium Protocol, Eye Care, Frequent Pain Assessment, Oral Care and Turn Reposition  Additional Recommendations (Limitations, Scope, Preferences):  Full Scope Treatment with care liits set of DNR/DNI  Psycho-social/Spiritual:   Desire for further  Chaplaincy support:no  Additional Recommendations: Referral to Community Resources   Prognosis:   < 6 months in the setting of stage IV non-small cell lung cancer, recurrent urinary tract infections, anemia with recent hemoglobin of 6.4 requiring transfusion, protein calorie malnutrition albumin 2.2, chronic kidney disease stage III  Discharge Planning: Home with Hospice      Primary Diagnoses: Present on Admission: . UTI (urinary tract infection)   I have reviewed the medical record, interviewed the patient and family, and examined the patient. The following aspects are pertinent.  Past Medical History:  Diagnosis Date  . Anemia   . Arthritis    "left shoulder" (04/07/2016)  . Blind    s/p bilateral corneal transplant; "legally blind in both eyes" (04/07/2016)  . Cancer ([place])    lung  . CKD (chronic kidney disease), stage III ([place])   . COPD (chronic obstructive pulmonary disease) ([place])    pt denies  . DVT (deep venous thrombosis) ([place]) "years ago"   "right thigh"    . HEARING LOSS   . Hyperlipidemia   . Hypertension   . PAD (peripheral artery disease) ([place])    a. 03/2016 Periph Angio: L SFA 99p, 70m w/ one vessel runoff. R SFA 147m w/ one vessel runofff;  b. 04/07/2016 PTA L SFA ([place] 1 LS atherecromty w/ DEBA). c. 04/24/16 s/p PTA to R SFA, right tibioperoneal trunk and posterior tibial artery.  . Phlebitis   . Squamous cell carcinoma lung ([place])   . Tobacco abuse    Social History   Socioeconomic History  . Marital status: Divorced    Spouse name: Not on file  . Number of children: Not on file  . Years of education: Not on file  . Highest education level: Not on file  Occupational History  . Not on file  Social Needs  . Financial resource strain: Not on file  . Food insecurity:    Worry: Not on file    Inability: Not on file  . Transportation needs:    Medical: Not on file    Non-medical: Not on file  Tobacco Use  . Smoking status: Former Smoker     Packs/day: 0.50    Years: 62.00    Pack years: 31.00    Types: Cigarettes  . Smokeless tobacco: Never Used  . Tobacco comment: Quit 06/2017  Substance and Sexual Activity  . Alcohol use: No    Alcohol/week: 0.0 standard drinks  . Drug use: No  . Sexual activity: Never    Birth control/protection: Post-menopausal  Lifestyle  . Physical activity:    Days per week: Not on file    Minutes per session: Not on file  . Stress: Not on file  Relationships  . Social connections:  Talks on phone: Not on file    Gets together: Not on file    Attends religious service: Not on file    Active member of club or organization: Not on file    Attends meetings of clubs or organizations: Not on file    Relationship status: Not on file  Other Topics Concern  . Not on file  Social History Narrative   Patient currently lives at home alone, but does have a home worker who visits her throughout the week to help with things around the house.  Patient states that she still tries to remain active and walks daily.  She is has been divorced for sometime and has one son.  She currently lives off social security.  Patient reports using approximately half a pack of cigarettes daily for 20 years.  She denies any alcohol or illicit drug use.  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     Family History  Problem Relation Age of Onset  . Cancer Maternal Grandmother        liver  . Cancer Maternal Uncle        colon  . Cancer Mother        liver  . Cancer Brother        Unknown  . Cancer Sister        Unknown   Scheduled Meds: . sodium chloride   Intravenous Once  . clopidogrel  75 mg Oral Q breakfast  . feeding supplement (ENSURE ENLIVE)  237 mL Oral TID BM  . feeding supplement (PRO-STAT SUGAR FREE 64)  30 mL Oral BID  . gabapentin  300 mg Oral TID  . heparin  5,000 Units Subcutaneous Q8H  . polyethylene glycol  17 g Oral Daily    Continuous Infusions: PRN Meds:.ALPRAZolam, HYDROcodone-acetaminophen, ondansetron (ZOFRAN) IV **OR** ondansetron Medications Prior to Admission:  Prior to Admission medications   Medication Sig Start Date End Date Taking? Authorizing Provider  albuterol (PROVENTIL HFA;VENTOLIN HFA) 108 (90 Base) MCG/ACT inhaler Inhale 2 puffs into the lungs every 6 (six) hours as needed for wheezing or shortness of breath. 08/15/15  Yes Dana Falcon, MD  atorvastatin (LIPITOR) 40 MG tablet Take 40 mg by mouth daily.   Yes [provider]  bisacodyl (DULCOLAX) 5 MG EC tablet Take 5 mg by mouth daily as needed for mild constipation or moderate constipation.   Yes [provider]  clopidogrel (PLAVIX) 75 MG tablet Take 1 tablet (75 mg total) by mouth daily with breakfast. Okay to restart this medication on Sunday 10/18/2017. 10/16/17  Yes Byrum, Rose Fillers, MD  ALPRAZolam Duanne Moron) 0.25 MG tablet Take 1 tablet (0.25 mg total) by mouth 2 (two) times daily as needed for anxiety. Patient not taking: Reported on 01/02/2018 12/01/17   Georgette Shell, MD  HYDROcodone-acetaminophen (NORCO/VICODIN) 5-325 MG tablet Take 1 tablet by mouth every 4 (four) hours as needed for moderate pain. Patient not taking: Reported on 01/02/2018 01/01/18 01/31/18  Dana Falcon, MD  polyethylene glycol [facility]) packet Take 17 g by mouth daily. Patient not taking: Reported on 01/02/2018 11/18/17   Isla Pence, MD  enoxaparin (LOVENOX) 80 MG/0.8ML SOLN injection Inject 0.8 mLs (80 mg total) into the skin every 12 (twelve) hours. 07/09/11 07/22/11  Hoyt Koch, MD   Allergies  Allergen Reactions  . Codeine Other (See Comments)    REACTION: Pt c/o "feeling high"  . Morphine Other (See  Comments)    REACTION: feels funny and "high"   Review of Systems  Unable to perform ROS: Other    Physical Exam  Constitutional: She appears well-developed and well-nourished.  Somnolent  HENT:  Head: Normocephalic and  atraumatic.  Eyes:  Legally blind  Cardiovascular: Normal rate.  Pulmonary/Chest:  No increased work of breathing at rest  Neurological:  Somnolent Extremely hard of hearing; hears best out of her right ear  Skin: Skin is warm and dry.  Psychiatric:  No overt agitation otherwise unable to test; patient is napping and asked to return to sleep  Nursing note and vitals reviewed.   Vital Signs: BP 129/66 (BP Location: Right Arm)   Pulse 95   Temp 99.9 F (37.7 C) (Oral)   Resp 18   Ht 5\' 6"  (1.676 m)   Wt 51.3 kg   LMP  (LMP Unknown)   SpO2 96%   BMI 18.24 kg/m  Pain Scale: 0-10 POSS *See Group Information*: S-Acceptable,Sleep, easy to arouse Pain Score: 0-No pain   SpO2: SpO2: 96 % O2 Device:SpO2: 96 % O2 Flow Rate: .   IO: Intake/output summary:   Intake/Output Summary (Last 24 hours) at 01/03/2018 1440 Last data filed at 01/03/2018 1312 Gross per 24 hour  Intake 763.92 ml  Output -  Net 763.92 ml    LBM: Last BM Date: 01/01/18 Baseline Weight: Weight: 51.3 kg Most recent weight: Weight: 51.3 kg     Palliative Assessment/Data:   Flowsheet Rows     Most Recent Value  Intake Tab  Referral Department  Hospitalist  Unit at Time of Referral  Med/Surg Unit  Palliative Care Primary Diagnosis  Sepsis/Infectious Disease  Date Notified  01/02/18  Palliative Care Type  Return patient Palliative Care  Reason for referral  Pain, Clarify Goals of Care, Counsel Regarding Hospice, Psychosocial or Spiritual support  Date of Admission  01/02/18  Date first seen by Palliative Care  01/03/18  # of days Palliative referral response time  1 Day(s)  # of days IP prior to Palliative referral  0  Clinical Assessment  Palliative Performance Scale Score  40%  Pain Max last 24 hours  Not able to report  Pain Min Last 24 hours  Not able to report  Dyspnea Max Last 24 Hours  Not able to report  Dyspnea Min Last 24 hours  Not able to report  Nausea Max Last 24 Hours  Not able to  report  Nausea Min Last 24 Hours  Not able to report  Anxiety Max Last 24 Hours  Not able to report  Anxiety Min Last 24 Hours  Not able to report  Other Max Last 24 Hours  Not able to report  Psychosocial & Spiritual Assessment  Palliative Care Outcomes  Patient/Family meeting held?  Yes  Who was at the meeting?  dtr via phone,  LM for son  Palliative Care Outcomes  Counseled regarding hospice, Changed CPR status, Transitioned to hospice, Clarified goals of care, Improved pain interventions  Patient/Family wishes: Interventions discontinued/not started   Mechanical Ventilation  Palliative Care follow-up planned  Yes, Facility      Time In: 1400 Time Out: 1456 Time Total: 56 min Greater than 50%  of this time was spent counseling and coordinating care related to the above assessment and plan.  Addendum 1545: Received call from RN. Pt refusing DNR. Will make pt full code again and readdress with family present on 01/04/18. Communicated this to [place], Microbiologist by:  Dory Horn, NP   Please contact Palliative Medicine Team phone at [phone] for questions and concerns.  For individual provider: See Shea Evans

## 2018-01-03 NOTE — Consult Note (Signed)
[facility] Nurse wound consult note Reason for Consult:Bilateral heel DPTI Wound type:Pressure Pressure Injury POA: Yes Measurement: Right and Left heels:  3cm x 3cm with 2cm round area of deeper hue in center bilaterally. Wound bed:Dry with resolving blood blister beneath skin Drainage (amount, consistency, odor) None Periwound:intact, dry Dressing procedure/placement/frequency: I will provide Nursing with guidance for the painting of the affected areas with betadine swab sticks and allowing them to air-dry.  No dressing.  Feet are to be placed into bilateral pressure redistribution heel boots (Prevalon)   A sacral prophylactic dressing is to be placed, patient wears briefs for UI and frequent turning and reposition off of the sacral area is necessary. Avoid the supine position except for meals.  [facility] nursing team will not follow, but will remain available to this patient, the nursing and medical teams.  Please re-consult if needed. Thanks, Maudie Flakes, MSN, RN, [place], Arther Abbott  Pager# [phone]

## 2018-01-03 NOTE — Progress Notes (Addendum)
CRITICAL VALUE ALERT  Critical Value:  hgb 6.4  Date & Time Notied:  8/25, 06:34  Provider Notified: Eileen Stanford MD   Orders Received/Actions taken: transfuse 1 unit.

## 2018-01-03 NOTE — Clinical Social Work Note (Signed)
Clinical Social Work Assessment  Patient Details  Name: [name] MRN: [id number] Date of Birth: 27-Aug-1940  Date of referral:  01/03/18               Reason for consult:  Facility Placement                Permission sought to share information with:  Family Supports Permission granted to share information::  Yes, Verbal Permission Granted  Name::     Diplomatic Services operational officer::     Relationship::  Son  Sport and exercise psychologist Information:     Housing/Transportation Living arrangements for the past 2 months:  Single Family Home Source of Information:  Patient Patient Interpreter Needed:  None Criminal Activity/Legal Involvement Pertinent to Current Situation/Hospitalization:  No - Comment as needed Significant Relationships:  Adult Children Lives with:  Adult Children Do you feel safe going back to the place where you live?  Yes Need for family participation in patient care:  Yes (Comment)  Care giving concerns:  Pt is alert and oriented.   Social Worker assessment / plan:  CSW spoke with pt at bedside. Pt states she is going home with her son at d/c. Pt is refusing SNF at d/c. CSW attempted to reach pt's son--was unsuccessful. RNCM updated. Clinical Social Worker will sign off for now as social work intervention is no longer needed. Please consult [place] again if new need arises.     Employment status:  Retired Nurse, adult PT Recommendations:  [place] / Referral to community resources:  [place]  Patient/Family's Response to care:  Pt is aware of SNF recommendation however is refusing SNF at this time.   Patient/Family's Understanding of and Emotional Response to Diagnosis, Current Treatment, and Prognosis:  Pt is aware of physical limitations however is refusing SNF at this time. Pt states her son lives with her. RNCM notified.   Emotional Assessment Appearance:  Appears stated age Attitude/Demeanor/Rapport:  (Patient  was appropriate) Affect (typically observed):  Accepting, Appropriate, Calm Orientation:  Oriented to Self, Oriented to Place, Oriented to  Time, Oriented to Situation Alcohol / Substance use:  Not Applicable Psych involvement (Current and /or in the community):  No (Comment)  Discharge Needs  Concerns to be addressed:  Patient refuses services Readmission within the last 30 days:  No Current discharge risk:  Dependent with Mobility Barriers to Discharge:  Continued Medical Work up   [employer], LCSW 01/03/2018, 10:32 AM

## 2018-01-03 NOTE — Evaluation (Addendum)
Occupational Therapy Evaluation Patient Details Name: [name] MRN: [id number] DOB: 08-16-1940 Today's Date: 01/03/2018    History of Present Illness 77 y.o. female with recently diagnosed non-small cell lung cancer had chemotherapy last week he was complaining of abdominal pain burning sensation with diarrhea.  Admitted with sepsis likely from UTI and possible pneumonia   Clinical Impression   PTA patient reports living at home with her son (recent rehab stay at [facility]), was requiring assistance for wc mobility, assist with LB self care (able to complete UB self care with setup assist), and required assistance for transfers (+2 for tub transfers).  Hgb low, but received blood transfusion today and cleared participation with RN. Evaluation limited as patient declines to participate in bed mobility with therapist, but requires setup assist for grooming, minA for UB ADL, max to total assist for LB ADL, and per PT eval yesterday requires +2 minA for transfers.  Educated patient on safety and importance of daily mobility, she is agreeable but declines at this time.  Limitations listed below, see problem list.  Based on performance today, patient will need continued OT services while admitted and after discharge in order to decrease burden of care.  She reports planning to go home to her sons home, where she has 24/7 support; but recommend SNF at this time.  If she discharges home, will need maximal home health services. Will continue to follow and update recommendations as able.     Follow Up Recommendations  SNF;Supervision/Assistance - 24 hour(if declines SNF needs maximal HH services)    Equipment Recommendations  3 in 1 bedside commode    Recommendations for Other Services       Precautions / Restrictions Precautions Precautions: Fall Precaution Comments: Pt extremely HOH and legally blind Restrictions Weight Bearing Restrictions: No      Mobility Bed Mobility                General bed mobility comments: pt declined to complete mobility with therapist, repositioned in bed for comfort   Transfers                 General transfer comment: declined to complete today     Balance                                           ADL either performed or assessed with clinical judgement   ADL Overall ADL's : Needs assistance/impaired Eating/Feeding: Set up;Bed level   Grooming: Set up;Bed level   Upper Body Bathing: Minimal assistance;Bed level   Lower Body Bathing: Maximal assistance;Bed level   Upper Body Dressing : Minimal assistance;Bed level   Lower Body Dressing: Minimal assistance;Bed level     Toilet Transfer Details (indicate cue type and reason): pt declines at this time, anticipate 2+ for safety           General ADL Comments: Patient reporting "I don't feel good" and declines tranisitioning to EOB at this time.  Limited evaluation.  Completed grooming, UE ROM/sensation testing, and positioning in bed.     Vision Baseline Vision/History: (pt legally blind) Patient Visual Report: No change from baseline Additional Comments: reports vision is fair, able to feed self and reach for items from therapist      Perception     Praxis      Pertinent Vitals/Pain Pain Assessment: No/denies pain  Hand Dominance     Extremity/Trunk Assessment Upper Extremity Assessment Upper Extremity Assessment: Generalized weakness   Lower Extremity Assessment Lower Extremity Assessment: Defer to PT evaluation   Cervical / Trunk Assessment Cervical / Trunk Assessment: Kyphotic   Communication Communication Communication: HOH   Cognition Arousal/Alertness: Awake/alert Behavior During Therapy: WFL for tasks assessed/performed Overall Cognitive Status: No family/caregiver present to determine baseline cognitive functioning                                 General Comments: patient able to follow 1 step commands,  but eval limited due to [place] Comments  limited eval     Exercises     Shoulder Instructions      Home Living Family/patient expects to be discharged to:: Private residence Living Arrangements: Children(son) Available Help at Discharge: Family;Personal care attendant;Available PRN/intermittently;Friend(s) Type of Home: House Home Access: Stairs to enter [employer] of Steps: (unclear)   Home Layout: One level     Bathroom Shower/Tub: Teacher, early years/pre: Standard Bathroom Accessibility: Yes How Accessible: Accessible via wheelchair Home Equipment: Wheelchair - manual;Bedside commode;Shower seat(unclear reports theres a BSC there but its not mine)   Additional Comments: Information gathered from patient, unclear is patient is able to provide clear history      Prior Functioning/Environment Level of Independence: Needs assistance  Gait / Transfers Assistance Needed: Pt states that she has not walked since before she went to rehab. She states at rehab she was in the wheelchair mostly and was not walking.  ADL's / Homemaking Assistance Needed: 2 person assist for tub transfers A for LB self care but able to complete UB self care with setup assist  (had a home health aide when she was living alone,)            OT Problem List: Decreased strength;Decreased activity tolerance;Impaired balance (sitting and/or standing);Decreased safety awareness;Decreased knowledge of use of DME or AE;Decreased knowledge of precautions;Decreased coordination      OT Treatment/Interventions: Self-care/ADL training;DME and/or AE instruction;Therapeutic activities;Patient/family education;Balance training    OT Goals(Current goals can be found in the care plan section) Acute Rehab OT Goals Patient Stated Goal: i want to go home and have therapy come to the house OT Goal Formulation: With patient Time For Goal Achievement: 01/17/18 Potential to Achieve Goals: Fair   OT Frequency: Min 2X/week   Barriers to D/C:            Co-evaluation              AM-PAC PT "6 Clicks" Daily Activity     Outcome Measure Help from another person eating meals?: A Little Help from another person taking care of personal grooming?: A Little Help from another person toileting, which includes using toliet, bedpan, or urinal?: Total Help from another person bathing (including washing, rinsing, drying)?: A Lot Help from another person to put on and taking off regular upper body clothing?: A Little Help from another person to put on and taking off regular lower body clothing?: A Lot 6 Click Score: 14   End of Session Nurse Communication: Mobility status  Activity Tolerance: Patient limited by fatigue Patient left: in bed;with call bell/phone within reach;with nursing/sitter in room  OT Visit Diagnosis: Other abnormalities of gait and mobility (R26.89);Muscle weakness (generalized) (M62.81)                Time: 0175-1025  OT Time Calculation (min): 21 min Charges:  OT General Charges $OT Visit: 1 Visit OT Evaluation $OT Eval Low Complexity: 1 Low  Delight Stare, OTR/L  Pager [phone]   Delight Stare 01/03/2018, 3:19 PM

## 2018-01-03 NOTE — Progress Notes (Signed)
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  [name] was examined and evaluated at bedside this AM. She has new complaint of upper extremity weakness and continuing to endorse lower extremity numbness. She states she has difficult time picking up her water cup with her left hand. She has difficult time answering her questions due to her hearing loss. She repeatedly asks, 'what are you going to do to me?' Explained to the patient that she will be transfused 1 unit of blood for anemia. She expressed understanding. She denies any F/N/V/D/C. No headaches, chest pain, palpitations, weakness or light-headedness.  Objective:  Vital signs in last 24 hours: Vitals:   01/02/18 0917 01/02/18 1000 01/02/18 2028 01/03/18 0447  BP: (!) 162/91  132/73 129/67  Pulse: 77  92 98  Resp:   18 16  Temp: 97.8 F (36.6 C)  98.1 F (36.7 C) 98.5 F (36.9 C)  TempSrc: Oral  Oral Oral  SpO2: 97%  99% 98%  Weight:  51.3 kg    Height:  5\' 6"  (1.676 m)     Physical Exam  Constitutional: She is well-developed, well-nourished, and in no distress. No distress.  HENT:  Head: Normocephalic and atraumatic.  Mouth/Throat: Oropharynx is clear and moist. No oropharyngeal exudate.  Eyes: Pupils are equal, round, and reactive to light. Conjunctivae are normal. No scleral icterus.  Bilateral nystagmus  Neck: Normal range of motion. Neck supple. No JVD present. No thyromegaly present.  Cardiovascular: Normal rate, regular rhythm, normal heart sounds and intact distal pulses.  Pulmonary/Chest: Effort normal and breath sounds normal. No respiratory distress. She has no wheezes.  Abdominal: Soft. Bowel sounds are normal. She exhibits no distension. There is no tenderness. There is no  guarding.  Musculoskeletal: Normal range of motion. She exhibits no edema, tenderness or deformity.  Neurological: She is alert. No cranial nerve deficit.  Bilateral lower extremity sensation present but endorsing decreased sensation  Skin: Skin is warm and dry. No rash noted. She is not diaphoretic. No erythema.  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 Will need palliative to re-establish goals of care. Currently giving supportive care with gabapentin. Found to be anemic this morning. Will replenish and reassess  Symptomatic Macrocytic Anemia  Hx of pancytopenia secondary to chemo. CBC during last hospitalization in 11/2017 shows normocytic anemia at discharge. Hx of blood transfusion in clinic on 12/03/17. AM CBC show hgb at 6.4. B12 2111 - Transfuse 1 unit of blood - Repeat Hgb post transfusion  Lower extremity pain and numbness 2/2 chemo induced peripheral neuropathy vs B12 deficiency vs diabetic neuropathy Presents with acute vs chronic bilateral numbness and pain of 2 month duration. Hx consistent with chemo induced peripheral neuropathy however no mentions of these symptoms during other ED visits since her chemo. No significant edema, warmth, redness. Physical exam not consistent with GBS. No hx of diabetes. Will provide supportive care and therapy and  rule out other causes of peripheral neuropathy. 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 Unable to tolerate chemo and admitted couple days after chemo on 11/24/17 for pneumonia. Oncology deemed her not candidate for further chemo. Palliative had been following since that hospitalization. Next appt with outpatient palliative on 8/30th - Consult to palliative care  CKD stage 3 baseline creatinine 1.99. 1.64 today  Hx of Right Cerebellar CVA - c/w home med: Plavix 75mg  daily  Hx of Anxiety - C/w home med: alprazolam 0.25mg  BID PRN for anxiety  DVT prophx: Heparin Diet: Regular Code: FULL  Dispo: Anticipated discharge in approximately 2 day(s).   Mosetta Anis, MD 01/03/2018, 9:52 AM Pager: [phone]

## 2018-01-03 NOTE — Progress Notes (Signed)
  Date: 01/03/2018  Patient name: [name]  Medical record number: [id number]  Date of birth: 1940-09-24   I have seen and evaluated [name] and discussed their care with the Residency Team.  In brief, patient is a 77 year old female with a past medical history of stage IV squamous cell lung cancer, CVA, CKD stage III, PAD, legally blind, bilateral hearing loss, DVT, hyperlipidemia, hypertension who presented to the ED with worsening bilateral lower extremity numbness and difficulty ambulating.  Patient states that she has pain in both her legs bilaterally extending from the bottom of her feet up to her knees and associated with numbness.  Patient states that after he received chemotherapy in July she has had persistent numbness and has had progressive difficulty walking since then secondary to the pain in her legs.  Per chart, resident discussed case with daughter via phone who states that the patient also endorsed some fatigue and lightheadedness as well as some weight loss.  Patient denies any dysuria, hematuria or frequency or urgency.  No chest pain, no shortness of breath, no palpitations, no lightheadedness, no syncope, no headache, no fevers or chills, no nausea or vomiting, no abdominal pain, no diarrhea.  Today patient states that she has some tingling and numbness in both her hands as well and that she has difficulty holding things.  However, when he walked in the room she was holding a glass and sipping from it.  PMHx, Fam Hx, and/or Soc Hx : As per resident admit note  Vitals:   01/03/18 1031 01/03/18 1233  BP: 131/73 133/74  Pulse: 89 86  Resp: 19 18  Temp: 98.5 F (36.9 C) 98.1 F (36.7 C)  SpO2:     General: Awake, alert, NAD CVS: Regular rate and rhythm, normal heart sounds Lungs: CTA bilaterally Abdomen: Soft, nontender, nondistended, normoactive bowel sounds Extremities: Patient states that she can feel me touching her feet but sensation is diminished, no  lower extremity edema  Assessment and Plan: I have seen and evaluated the patient as outlined above. I agree with the formulated Assessment and Plan as detailed in the residents' note, with the following changes:   1.  Bilateral lower extremity numbness: -This appears to be a chronic problem that patient has had since her chemotherapy.  Of note, patient has had multiple visits in the internal medicine clinic for bilateral foot pain which she had initially attributed to her calluses.  Patient states that the numbness is new since her chemotherapy and is likely secondary to chemotherapy-induced peripheral neuropathy but she has not mentioned this on multiple previous ED visits. -Patient's B12, HbA1c and TSH levels are within normal limits -We will start gabapentin 300 mg 3 times daily for her likely neuropathic pain -Patient is not a candidate for further chemotherapy. -Patient has been followed with palliative care, for her stage IV lung cancer, as an outpatient since her hospitalization in July. We will reconsult palliative care for discussion regarding goals of care -Patient also noted to have worsening anemia today with a hemoglobin of 6.4.  Will transfuse 1 unit PRBC and monitor her hemoglobin closely.  She has no signs of an active bleed currently. -No further work-up at this time.  We will continue to monitor closely   Aldine Contes, MD 8/25/201912:40 PM

## 2018-01-03 NOTE — Progress Notes (Signed)
Went to apply patient's DNR bracelet. Patient states she does not want to be a DNR. She said if her heart were to stop or she were to stop breathing, she wants [place] to do everything possible to bring her back. Palliative team called, awaiting a call back.

## 2018-01-04 ENCOUNTER — Other Ambulatory Visit: Payer: Self-pay | Admitting: *Deleted

## 2018-01-04 ENCOUNTER — Non-Acute Institutional Stay: Payer: Medicare Other | Admitting: Hospice and Palliative Medicine

## 2018-01-04 DIAGNOSIS — I739 Peripheral vascular disease, unspecified: Secondary | ICD-10-CM

## 2018-01-04 DIAGNOSIS — I951 Orthostatic hypotension: Secondary | ICD-10-CM

## 2018-01-04 DIAGNOSIS — Z86718 Personal history of other venous thrombosis and embolism: Secondary | ICD-10-CM

## 2018-01-04 DIAGNOSIS — Z7902 Long term (current) use of antithrombotics/antiplatelets: Secondary | ICD-10-CM

## 2018-01-04 DIAGNOSIS — Z9221 Personal history of antineoplastic chemotherapy: Secondary | ICD-10-CM

## 2018-01-04 DIAGNOSIS — D72829 Elevated white blood cell count, unspecified: Secondary | ICD-10-CM

## 2018-01-04 DIAGNOSIS — Z8673 Personal history of transient ischemic attack (TIA), and cerebral infarction without residual deficits: Secondary | ICD-10-CM

## 2018-01-04 DIAGNOSIS — Z79891 Long term (current) use of opiate analgesic: Secondary | ICD-10-CM

## 2018-01-04 DIAGNOSIS — B9689 Other specified bacterial agents as the cause of diseases classified elsewhere: Secondary | ICD-10-CM

## 2018-01-04 DIAGNOSIS — D649 Anemia, unspecified: Secondary | ICD-10-CM

## 2018-01-04 DIAGNOSIS — Z862 Personal history of diseases of the blood and blood-forming organs and certain disorders involving the immune mechanism: Secondary | ICD-10-CM

## 2018-01-04 DIAGNOSIS — I129 Hypertensive chronic kidney disease with stage 1 through stage 4 chronic kidney disease, or unspecified chronic kidney disease: Secondary | ICD-10-CM

## 2018-01-04 DIAGNOSIS — Z7189 Other specified counseling: Secondary | ICD-10-CM

## 2018-01-04 DIAGNOSIS — E785 Hyperlipidemia, unspecified: Secondary | ICD-10-CM

## 2018-01-04 DIAGNOSIS — L97419 Non-pressure chronic ulcer of right heel and midfoot with unspecified severity: Secondary | ICD-10-CM

## 2018-01-04 DIAGNOSIS — F419 Anxiety disorder, unspecified: Secondary | ICD-10-CM

## 2018-01-04 DIAGNOSIS — L97429 Non-pressure chronic ulcer of left heel and midfoot with unspecified severity: Secondary | ICD-10-CM

## 2018-01-04 DIAGNOSIS — Z9889 Other specified postprocedural states: Secondary | ICD-10-CM

## 2018-01-04 DIAGNOSIS — Z515 Encounter for palliative care: Secondary | ICD-10-CM

## 2018-01-04 DIAGNOSIS — H9193 Unspecified hearing loss, bilateral: Secondary | ICD-10-CM

## 2018-01-04 DIAGNOSIS — G620 Drug-induced polyneuropathy: Secondary | ICD-10-CM

## 2018-01-04 DIAGNOSIS — N390 Urinary tract infection, site not specified: Secondary | ICD-10-CM

## 2018-01-04 DIAGNOSIS — D539 Nutritional anemia, unspecified: Secondary | ICD-10-CM

## 2018-01-04 DIAGNOSIS — T451X5D Adverse effect of antineoplastic and immunosuppressive drugs, subsequent encounter: Secondary | ICD-10-CM

## 2018-01-04 DIAGNOSIS — H548 Legal blindness, as defined in USA: Secondary | ICD-10-CM

## 2018-01-04 DIAGNOSIS — R509 Fever, unspecified: Secondary | ICD-10-CM

## 2018-01-04 DIAGNOSIS — Z885 Allergy status to narcotic agent status: Secondary | ICD-10-CM

## 2018-01-04 DIAGNOSIS — T451X5A Adverse effect of antineoplastic and immunosuppressive drugs, initial encounter: Secondary | ICD-10-CM

## 2018-01-04 DIAGNOSIS — B961 Klebsiella pneumoniae [K. pneumoniae] as the cause of diseases classified elsewhere: Secondary | ICD-10-CM

## 2018-01-04 DIAGNOSIS — Z66 Do not resuscitate: Secondary | ICD-10-CM

## 2018-01-04 DIAGNOSIS — G629 Polyneuropathy, unspecified: Secondary | ICD-10-CM

## 2018-01-04 DIAGNOSIS — H5500 Unspecified nystagmus: Secondary | ICD-10-CM

## 2018-01-04 DIAGNOSIS — R51 Headache: Secondary | ICD-10-CM

## 2018-01-04 DIAGNOSIS — C3491 Malignant neoplasm of unspecified part of right bronchus or lung: Secondary | ICD-10-CM

## 2018-01-04 DIAGNOSIS — R519 Headache, unspecified: Secondary | ICD-10-CM

## 2018-01-04 DIAGNOSIS — Z79899 Other long term (current) drug therapy: Secondary | ICD-10-CM

## 2018-01-04 DIAGNOSIS — N183 Chronic kidney disease, stage 3 (moderate): Secondary | ICD-10-CM

## 2018-01-04 LAB — CBC
HEMATOCRIT: 26.3 % — AB (ref 36.0–46.0)
Hemoglobin: 8.2 g/dL — ABNORMAL LOW (ref 12.0–15.0)
MCH: 31.2 pg (ref 26.0–34.0)
MCHC: 31.2 g/dL (ref 30.0–36.0)
MCV: 100 fL (ref 78.0–100.0)
PLATELETS: 271 10*3/uL (ref 150–400)
RBC: 2.63 MIL/uL — ABNORMAL LOW (ref 3.87–5.11)
RDW: 17.9 % — AB (ref 11.5–15.5)
WBC: 11.4 10*3/uL — AB (ref 4.0–10.5)

## 2018-01-04 LAB — URINE CULTURE: Culture: >=100000 — AB

## 2018-01-04 LAB — BPAM RBC
Blood Product Expiration Date: 201909252359
ISSUE DATE / TIME: 201908250957
UNIT TYPE AND RH: 9500

## 2018-01-04 LAB — TYPE AND SCREEN
ABO/RH(D): O NEG
ANTIBODY SCREEN: NEGATIVE
UNIT DIVISION: 0

## 2018-01-04 MED ORDER — OXYCODONE HCL 5 MG PO TABS: 5.0000 mg | ORAL_TABLET | ORAL | Status: DC | PRN

## 2018-01-04 MED ORDER — SODIUM CHLORIDE 0.9 % IV SOLN
1.0000 g | Freq: Once | INTRAVENOUS | Status: DC
  Filled 2018-01-04: qty 10

## 2018-01-04 MED ORDER — HYDRALAZINE HCL 20 MG/ML IJ SOLN
10.0000 mg | Freq: Four times a day (QID) | INTRAMUSCULAR | Status: DC | PRN
  Filled 2018-01-04: qty 1

## 2018-01-04 MED ORDER — OXYCODONE HCL 20 MG/ML PO CONC: 5.0000 mg | ORAL | Status: DC | PRN

## 2018-01-04 MED ORDER — CIPROFLOXACIN HCL 500 MG PO TABS: 500.0000 mg | ORAL_TABLET | Freq: Two times a day (BID) | ORAL | 0 refills | Status: AC

## 2018-01-04 MED ORDER — SODIUM CHLORIDE 0.9 % IV SOLN
1.0000 g | Freq: Every day | INTRAVENOUS | Status: DC
  Administered 2018-01-04 – 2018-01-05 (×2): 1 g via INTRAVENOUS
  Filled 2018-01-04 (×3): qty 10

## 2018-01-04 MED ORDER — OXYCODONE HCL 5 MG PO TABS
5.0000 mg | ORAL_TABLET | ORAL | Status: DC | PRN
  Administered 2018-01-04: 5 mg via ORAL
  Filled 2018-01-04: qty 1

## 2018-01-04 MED ORDER — SODIUM CHLORIDE 0.9 % IV SOLN: 1.0000 g | Freq: Every day | INTRAVENOUS | Status: DC

## 2018-01-04 MED ORDER — GABAPENTIN 100 MG PO CAPS: 300.0000 mg | ORAL_CAPSULE | Freq: Three times a day (TID) | ORAL | 0 refills | Status: AC

## 2018-01-04 MED ORDER — SODIUM CHLORIDE 0.9 % IV SOLN
INTRAVENOUS | Status: DC
  Administered 2018-01-04 (×2): via INTRAVENOUS

## 2018-01-04 MED ORDER — HYDRALAZINE HCL 20 MG/ML IJ SOLN: 10.0000 mg | Freq: Four times a day (QID) | INTRAMUSCULAR | Status: DC | PRN

## 2018-01-04 MED ORDER — ALBUTEROL SULFATE (2.5 MG/3ML) 0.083% IN NEBU
2.5000 mg | INHALATION_SOLUTION | Freq: Four times a day (QID) | RESPIRATORY_TRACT | Status: DC | PRN
  Administered 2018-01-04: 2.5 mg via RESPIRATORY_TRACT
  Filled 2018-01-04: qty 3

## 2018-01-04 NOTE — Progress Notes (Signed)
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 She has new complaint of dizziness with hypertension. She states she was told by the nurse she is having high blood pressure and she noticed that she is exhibiting some dizziness. She is continuing to endorse upper extremity weakness and she states her lower extremity pain has resolved. She was confused about her SCDs which was explained to her that they were put on because she refused her heparin. She denies any headache, chest pain, palpitations, nausea, vomiting.   Objective:  Vital signs in last 24 hours: Vitals:   01/03/18 1930 01/04/18 0414 01/04/18 0700 01/04/18 0758  BP: (!) 164/77 (!) 170/93 (!) 158/87 (!) 142/80  Pulse: 95 (!) 111 (!) 117 (!) 113  Resp:    19  Temp: 98 F (36.7 C) 98.9 F (37.2 C)  (!) 100.8 F (38.2 C)  TempSrc:  Oral  Oral  SpO2: 96% 97%  95%  Weight:      Height:       Physical Exam  Constitutional: She is well-developed, well-nourished, and in no distress. No distress.  HENT:  Head: Normocephalic and atraumatic.  Mouth/Throat: Oropharynx is clear and moist. No oropharyngeal exudate.  Bilateral hearing loss  Eyes: Pupils are equal, round, and reactive to light. Conjunctivae and EOM are normal. No scleral icterus.  Bilateral nystagmus  Neck: Normal range of motion. Neck supple. No JVD present. No thyromegaly present.  Cardiovascular: Normal rate, regular rhythm, normal heart sounds and intact distal pulses.  Pulmonary/Chest: Effort normal and breath sounds normal. No respiratory distress. She has no wheezes. She has no rales.  Abdominal: Soft. Bowel sounds are normal. She exhibits no distension. There is no tenderness.  There is no guarding.  Musculoskeletal: Normal range of motion. She exhibits no edema, tenderness or deformity.  Lymphadenopathy:    She has no cervical adenopathy.  Neurological: She is alert. No cranial nerve deficit. GCS score is 15.  Skin: Skin is warm and dry. She is not diaphoretic.  Psychiatric: Mood, memory, affect and judgment normal.   Assessment/Plan:  Principal Problem:   Neuropathy due to chemotherapeutic drug ([place]) Active Problems:   UTI (urinary tract infection)   Pressure injury of skin  Assessment & Plan by Problem:  [name], [name] is a 77 yo F w/ PMH of stage 4 squamous cell carcinoma of right lung s/p 1 round of chemo, Right cerebellar CVA, CKD stage 3, PAD, legally blind, bilateral hearing loss, DVT, HLD, HTN admit for bilateral lower extremity pain and numbness. 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 Will need palliative to re-establish goals of care. Currently giving supportive care with gabapentin. Family meeting scheduled for today. Will try to discharge pending dispo.  Leukocytosis w/ tachy, fever 2/2 complicated UTI Presented with asymptomatic bacteuria found on UA during ED admission. Culture positive for Kleb Pneumo. Received 1 dose of ceftriaxone on 01/02/2018, which was not continued as patient was asymptomatic. Presenting this morning with tachycardia, hypertension, fever and complaints of dizziness. Suspect she is endorsing symptoms of complicated UTI will resume antibiotic treatment. - Start IV ceftriaxone 1g  - Start IVF  - Hydralazine 10mg  IV PRN for SBP >180, DBP >110  Symptomatic Macrocytic Anemia  Hx of pancytopenia secondary to chemo. CBC during last hospitalization in 11/2017 shows normocytic anemia at discharge. Hx of blood transfusion in clinic on 12/03/17. 01/03/18 CBC show hgb at 6.4. B12 2111. Hgb post transfusion 8.2 - C/w monitor  Lower extremity pain and numbness 2/2 chemo induced peripheral neuropathy vs B12  deficiency vs diabetic neuropathy Presents with acute vs chronic bilateral numbness and pain of 2 month duration. Hx consistent with chemo induced peripheral neuropathy however no mentions of these symptoms during other ED visits since her chemo. No significant edema, warmth, redness. Physical exam not consistent with GBS. No hx of diabetes. Will provide supportive care and therapy and rule out other causes of peripheral neuropathy. Also has bilateral heel ulcers managed by wound care. Patient states lower extremity pain now resolved on gabapentin. OT recommend home health service as patient is declining SNF placement. Will reassess with family meeting today. TSH 3.75 Hgba1c 5.2  - C/w gabapentin 300mg  TID - C/w home med: Hydrocodone-acetaminophen 5-325mg  - C/w wound care consult for bilateral heel ulcers  Stage 4 Right Lung Squamous Cell Carcinoma  Hx of p63+ squamous cell carcinoma diagnosed in 10/2017 s/p 1 round of chemo with carboplatin, paclitaxel and pembrolizumab. Unable to tolerate chemo and admitted couple days after chemo on 11/24/17 for pneumonia. Oncology deemed her not candidate for further chemo. Palliative had been following since that hospitalization. Next appt with outpatient palliative on 8/30th. Palliative planned for family meeting - Consult to palliative care  CKD stage 3 baseline creatinine 1.99 (1.64 on 01/03/18)  Hx of Right Cerebellar CVA - c/w home med: Plavix 75mg  daily  Hx of Anxiety - C/w home med: alprazolam 0.25mg  BID PRN for anxiety  DVT prophx: SCDs (pt refusing heparin) Diet: Regular Code: FULL  Dispo: Anticipated discharge in approximately today   Dana Anis, MD 01/04/2018, 9:07 AM Pager: [phone]

## 2018-01-04 NOTE — Progress Notes (Signed)
OT Cancellation Note  Patient Details Name: [name] MRN: [id number] DOB: [birth year]   Cancelled Treatment:    Reason Eval/Treat Not Completed: Fatigue/lethargy limiting ability to participate  Pt asleep upon arrival, requiring tactile cues to arouse.  Pt refusing therapy at this time due to having high "pressure" earlier today and having received medications that have "worn me out".  Pt reports increased weakness and shakiness in UE with difficulty holding items.    Per chart review, pt to d/c home with Hospice.  Will continue to follow as able.  Simonne Come 01/04/2018, 2:36 PM

## 2018-01-04 NOTE — Discharge Instructions (Signed)
[name]  You came to [place] with complaints of lower leg numbness and pain. We started some medication for your pain and you stated it has improved. We also found some bacteria in your urine and we started you on some antibiotics to make you feel better. We feel that you are now ready to go home. Here are the recommendations for discharge:  START ciprofloxacin 1000mg  ER daily for 6 days START gabapentin 300mg  three times a day  Thank you for choosing [facility]   Urinary Tract Infection, Adult A urinary tract infection (UTI) is an infection of any part of the urinary tract. The urinary tract includes the:  Kidneys.  Ureters.  Bladder.  Urethra.  These organs make, store, and get rid of pee (urine) in the body. Follow these instructions at home:  Take over-the-counter and prescription medicines only as told by your doctor.  If you were prescribed an antibiotic medicine, take it as told by your doctor. Do not stop taking the antibiotic even if you start to feel better.  Avoid the following drinks: ? Alcohol. ? Caffeine. ? Tea. ? Carbonated drinks.  Drink enough fluid to keep your pee clear or pale yellow.  Keep all follow-up visits as told by your doctor. This is important.  Make sure to: ? Empty your bladder often and completely. Do not to hold pee for long periods of time. ? Empty your bladder before and after sex. ? Wipe from front to back after a bowel movement if you are female. Use each tissue one time when you wipe. Contact a doctor if:  You have back pain.  You have a fever.  You feel sick to your stomach (nauseous).  You throw up (vomit).  Your symptoms do not get better after 3 days.  Your symptoms go away and then come back. Get help right away if:  You have very bad back pain.  You have very bad lower belly (abdominal) pain.  You are throwing up and cannot keep down any medicines or water. This information is not intended to replace advice  given to you by your health care provider. Make sure you discuss any questions you have with your health care provider. Document Released: 10/15/2007 Document Revised: 10/04/2015 Document Reviewed: 03/19/2015 Elsevier Interactive Patient Education  [employer].

## 2018-01-04 NOTE — Discharge Summary (Signed)
Name: [name] MRN: [id number] DOB: 19-Aug-1940 77 y.o. PCP: Sid Falcon, MD  Date of Admission: 01/02/2018  3:00 AM Date of Discharge: 01/02/2018 6:00 PM Attending Physician: Aldine Contes, MD  Discharge Diagnosis: 1. Chemo-induced peripheral neuropathy 2. Complicated Urinary Tract Infection w/ Klebsiella Pneumoniae 3. Stage 4 Squamous cell carcinoma of the lung  Discharge Medications: Allergies as of 01/04/2018      Reactions   Codeine Other (See Comments)   REACTION: Pt c/o "feeling high"   Morphine Other (See Comments)   REACTION: feels funny and "high"      Medication List    TAKE these medications   albuterol 108 (90 Base) MCG/ACT inhaler Commonly known as:  PROVENTIL HFA;VENTOLIN HFA Inhale 2 puffs into the lungs every 6 (six) hours as needed for wheezing or shortness of breath.   ALPRAZolam 0.25 MG tablet Commonly known as:  XANAX Take 1 tablet (0.25 mg total) by mouth 2 (two) times daily as needed for anxiety.   atorvastatin 40 MG tablet Commonly known as:  LIPITOR Take 40 mg by mouth daily.   bisacodyl 5 MG EC tablet Commonly known as:  DULCOLAX Take 5 mg by mouth daily as needed for mild constipation or moderate constipation.   ciprofloxacin 500 MG tablet Commonly known as:  CIPRO Take 1 tablet (500 mg total) by mouth 2 (two) times daily for 7 days.   clopidogrel 75 MG tablet Commonly known as:  PLAVIX Take 1 tablet (75 mg total) by mouth daily with breakfast. Okay to restart this medication on Sunday 10/18/2017.   gabapentin 100 MG capsule Commonly known as:  NEURONTIN Take 3 capsules (300 mg total) by mouth 3 (three) times daily.   HYDROcodone-acetaminophen 5-325 MG tablet Commonly known as:  NORCO/VICODIN Take 1 tablet by mouth every 4 (four) hours as needed for moderate pain.   polyethylene glycol packet Commonly known as:  MIRALAX / GLYCOLAX Take 17 g by mouth daily.       Disposition and follow-up:   [name]  was discharged from [facility] in Stable condition.  At the hospital follow up visit please address:  1.  UTI: She received 1 dose of ceftriaxone and sent out on Cipro after culture sensitives showed resistance to other oral abx. Check for adherence and/or dc if she is experiencing significant side effects.  2.  Lower extremity Peripheral Neuropathy: Started on gabapentin 300mg  TID. DC if she is experiencing significant side effects  3.  Symptomatic Anemia: She was given 1 unit of blood for low anemia (6.4). Stable hgb at discharge please recheck at follow up visit.    3.  Labs / imaging needed at time of follow-up: CBC  3.  Pending labs/ test needing follow-up: None  Follow-up Appointments: Follow-up Information    Sid Falcon, MD Follow up on 01/08/2018.   Specialty:  Internal Medicine Why:  10:45 AM Contact information: [place] [address] [phone]        [facility] Follow up.   Why:  They will do your hospice care at your home Contact information: [place] Hospital Course by problem list:  1. Stage 4 Lung Squamous Cell Carcinoma: Known right upper lung primary stage 4 squamous cell carcinoma. Patient unable to tolerate chemo with significant side effects after 1 round of chemo. Palliative was consulted who changed her  code to DNR after deeming patient unable to make medical decisions and speaking with her daughter. Discharged with home hospice. On full treatment, not yet on comfort care.  2. Complicated UTI: Presented with asymptomatic bacteuria found on UA during ED admission. Culture positive for Kleb Pneumo. Received 1 dose of ceftriaxone on 01/02/2018, which was not continued as patient was asymptomatic. Presenting on morning of 01/04/18 with tachycardia, hypertension, fever and complaints of dizziness. Suspected she was endorsing symptoms of  complicated UTI. Given ceftriaxone 1g IV and her vitals returned to baseline with no further episodes of fever. Discharged on PO Cipro per sensitivities.  3. Peripheral neuropathy: Presented with lower extremity neuropathy with numbness and pain. Stated presenting after chemo but chart review show symptoms going back further. She also has prior hx of cerebellar infarct. Chart review revealed prior hx of work up by multiple providers but deemed appropriate to focus on symptom control. Started on Gabapentin 300mg  TID in the hospital. She stated pain resolved but new symptom of upper extremity weakness but neuro exam was unchanged from baseline. Discharged with gabapentin.  4. Symptomatic Macrocytic Anemia: Presented with hgb of 7.7. Overnight dropped to 6.4. Given 1 unit of blood and next day hgb was 8.2. She has hx of pancytopenia s/p chemo. Baseline hgb appears to be 8-9.  Discharge Vitals:   BP 122/78 (BP Location: Left Arm)   Pulse (!) 107   Temp 98 F (36.7 C) (Oral)   Resp 20   Ht 5\' 6"  (1.676 m)   Wt 51.3 kg   LMP  (LMP Unknown)   SpO2 93%   BMI 18.24 kg/m   Pertinent Labs, Studies, and Procedures:  Results for orders placed or performed during the hospital encounter of 01/02/18 (from the past 24 hour(s))  CBC     Status: Abnormal   Collection Time: 01/04/18  2:17 AM  Result Value Ref Range   WBC 11.4 (H) 4.0 - 10.5 K/uL   RBC 2.63 (L) 3.87 - 5.11 MIL/uL   Hemoglobin 8.2 (L) 12.0 - 15.0 g/dL   HCT 26.3 (L) 36.0 - 46.0 %   MCV 100.0 78.0 - 100.0 fL   MCH 31.2 26.0 - 34.0 pg   MCHC 31.2 30.0 - 36.0 g/dL   RDW 17.9 (H) 11.5 - 15.5 %   Platelets 271 150 - 400 K/uL   Results for orders placed or performed during the hospital encounter of 01/02/18  Urine Culture     Status: Abnormal   Collection Time: 01/02/18  5:50 AM  Result Value Ref Range Status   Specimen Description URINE, RANDOM  Final   Special Requests   Final    NONE Performed at [facility], [place] [address], [place], [place] [address]    Culture >=100,000 COLONIES/mL KLEBSIELLA PNEUMONIAE (A)  Final   Report Status 01/04/2018 FINAL  Final   Organism ID, Bacteria KLEBSIELLA PNEUMONIAE (A)  Final      Susceptibility   Klebsiella pneumoniae - MIC*    AMPICILLIN >=32 RESISTANT Resistant     CEFAZOLIN <=4 SENSITIVE Sensitive     CEFTRIAXONE <=1 SENSITIVE Sensitive     CIPROFLOXACIN <=0.25 SENSITIVE Sensitive     GENTAMICIN <=1 SENSITIVE Sensitive     IMIPENEM <=0.25 SENSITIVE Sensitive     NITROFURANTOIN 64 INTERMEDIATE Intermediate     TRIMETH/SULFA <=20 SENSITIVE Sensitive     AMPICILLIN/SULBACTAM 16 INTERMEDIATE Intermediate     PIP/TAZO 16 SENSITIVE Sensitive     Extended ESBL NEGATIVE  Sensitive     * >=100,000 COLONIES/mL KLEBSIELLA PNEUMONIAE   Discharge Instructions: Mrs.Hitson  You came to [place] with complaints of lower leg numbness and pain. We started some medication for your pain and you stated it has improved. We also found some bacteria in your urine and we started you on some antibiotics to make you feel better. We feel that you are now ready to go home. Here are the recommendations for discharge:  START ciprofloxacin 1000mg  ER daily for 6 days START gabapentin 300mg  three times a day  Thank you for choosing [facility]  Discharge Instructions    Diet - low sodium heart healthy   Complete by:  As directed    Increase activity slowly   Complete by:  As directed      Signed: Mosetta Anis, MD 01/04/2018, 4:02 PM   Pager: [phone]

## 2018-01-04 NOTE — Progress Notes (Signed)
Daily Progress Note   Patient Name: [name]       Date: 01/04/2018 DOB: 11/14/1940  Age: 77 y.o. MRN#: [id number] Attending Physician: Aldine Contes, MD Primary Care Physician: Sid Falcon, MD Admit Date: 01/02/2018  Reason for Consultation/Follow-up: Establishing goals of care  Subjective: Patient is in bed complaining of severe headache. She is crying out in pain.  She states that she has been unable to hold a drink but is able to now with some shakiness. Has equal upper strength present and able to move both lower extremities. Head pain eased off with cold washcloth, popsicle, and oxycodone administration.  Discussed her lung cancer and illness trajectory. She repetitively asks me why she doesn't have pain from her cancer. When I try to explain that not all cancers cause pain she is unaccepting of this, citing her family members who had cancer and were in severe pain. She asks what is being done to treat her cancer and then states, "absolutely nothing". We reviewed that she was not able tolerate the chemotherapy and this was the prescribed treatment. Per her daughter this has been previously discussed with her, but she forgets it and becomes confused.   Code status was discussed. I do not believe patient's mental state is in a place that allows her to comprehend the complexity of her medical situation and to make medical decisions. Per previous notes and discussions patient has expressed DNR status. Patient's daughter states she is DNR.   Patient's daughter states that plan is for patient to discharge home with Hospice for support of her Stage IV cancer diagnosis.     Review of Systems  Constitutional: Positive for malaise/fatigue and weight loss.  Respiratory: Positive for  shortness of breath.     Length of Stay: 2  Current Medications: Scheduled Meds:  . clopidogrel  75 mg Oral Q breakfast  . feeding supplement (ENSURE ENLIVE)  237 mL Oral TID BM  . feeding supplement (PRO-STAT SUGAR FREE 64)  30 mL Oral BID  . gabapentin  300 mg Oral TID  . heparin  5,000 Units Subcutaneous Q8H  . polyethylene glycol  17 g Oral Daily    Continuous Infusions: . sodium chloride 100 mL/hr at 01/04/18 1000  . cefTRIAXone (ROCEPHIN)  IV 1 g (01/04/18 1035)    PRN Meds:  albuterol, ALPRAZolam, hydrALAZINE, lip balm, ondansetron (ZOFRAN) IV **OR** ondansetron, oxyCODONE  Physical Exam  Constitutional:  frail  Pulmonary/Chest: She has wheezes.  Abdominal: Soft.  Skin: Skin is warm.  Nursing note and vitals reviewed.           Vital Signs: BP 122/78 (BP Location: Left Arm)   Pulse (!) 107   Temp 98 F (36.7 C) (Oral)   Resp 20   Ht 5\' 6"  (1.676 m)   Wt 51.3 kg   LMP  (LMP Unknown)   SpO2 93%   BMI 18.24 kg/m  SpO2: SpO2: 93 % O2 Device: O2 Device: Room Air O2 Flow Rate:    Intake/output summary:   Intake/Output Summary (Last 24 hours) at 01/04/2018 1420 Last data filed at 01/03/2018 1700 Gross per 24 hour  Intake 497 ml  Output -  Net 497 ml   LBM: Last BM Date: 01/01/18 Baseline Weight: Weight: 51.3 kg Most recent weight: Weight: 51.3 kg       Palliative Assessment/Data: PPS: 20%   Flowsheet Rows     Most Recent Value  Intake Tab  Referral Department  Hospitalist  Unit at Time of Referral  Med/Surg Unit  Palliative Care Primary Diagnosis  Sepsis/Infectious Disease  Date Notified  01/02/18  Palliative Care Type  Return patient Palliative Care  Reason for referral  Pain, Clarify Goals of Care, Counsel Regarding Hospice, Psychosocial or Spiritual support  Date of Admission  01/02/18  Date first seen by Palliative Care  01/03/18  # of days Palliative referral response time  1 Day(s)  # of days IP prior to Palliative referral  0  Clinical  Assessment  Palliative Performance Scale Score  40%  Pain Max last 24 hours  Not able to report  Pain Min Last 24 hours  Not able to report  Dyspnea Max Last 24 Hours  Not able to report  Dyspnea Min Last 24 hours  Not able to report  Nausea Max Last 24 Hours  Not able to report  Nausea Min Last 24 Hours  Not able to report  Anxiety Max Last 24 Hours  Not able to report  Anxiety Min Last 24 Hours  Not able to report  Other Max Last 24 Hours  Not able to report  Psychosocial & Spiritual Assessment  Palliative Care Outcomes  Patient/Family meeting held?  Yes  Who was at the meeting?  dtr via phone,  LM for son  [place] regarding hospice, Changed CPR status, Transitioned to hospice, Clarified goals of care, Improved pain interventions  Patient/Family wishes: Interventions discontinued/not started   Mechanical Ventilation  Palliative Care follow-up planned  Yes, Facility      Patient Active Problem List   Diagnosis Date Noted  . Urinary tract infection due to Klebsiella species   . Adenocarcinoma of right lung, stage 4 ([place])   . Neuropathy due to chemotherapeutic drug ([place]) 01/03/2018  . UTI (urinary tract infection) 01/02/2018  . Pressure injury of skin 01/02/2018  . Palliative care by specialist   . Acute respiratory distress   . Malignant neoplasm of right lung ([place])   . Wheezing   . Hypoxia   . Sepsis ([place]) 11/25/2017  . ARF (acute renal failure) ([place]) 11/25/2017  . Acute lower UTI 11/25/2017  . Diarrhea 11/25/2017  . Pancytopenia ([place]) 11/25/2017  . Stage IV squamous cell carcinoma of right lung ([place]) 11/03/2017  . Goals of care, counseling/discussion 11/03/2017  . Encounter for antineoplastic chemotherapy 11/03/2017  .  Mediastinal lymphadenopathy 10/16/2017  . Lung mass 10/16/2017  . Symptomatic anemia   . Malnutrition of moderate degree 10/12/2017  . Orthostatic hypotension 09/15/2017  . Mass of upper lobe of right lung   . Acute cystitis  with hematuria   . Pneumonia 09/13/2017  . Acute-on-chronic kidney injury ([place]) 09/13/2017  . Ganglion cyst of finger 03/26/2017  . S/P laparoscopic cholecystectomy 01/14/2017  . PVD (peripheral vascular disease) s/p LLE PTA 03/2016, RLE PTA 04/2016   . Bilateral foot pain 02/20/2016  . Epidermoid cyst 11/01/2015  . Vitamin D deficiency 03/29/2014  . COPD (chronic obstructive pulmonary disease) ([place]) 02/21/2014  . Postmenopausal 06/16/2013  . Preventative health care 06/16/2013  . Constipation 11/04/2012  . Bilateral shoulder pain 09/01/2012  . CKD (chronic kidney disease) stage 3, GFR 30-59 ml/min (H[facility]) 11/28/2009  . Hyperlipidemia 07/02/2006  . Hearing loss 05/13/2006  . TOBACCO ABUSE 03/26/2006  . BLINDNESS 03/26/2006  . Essential hypertension 03/26/2006    Palliative Care Assessment & Plan   Patient Profile: 77 y.o. 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  Patient was found to have a UTI.  She was also found to be very anemic, with a hemoglobin of 6.4 and underwent transfusion.  Consult ordered for goals of care.  Ms. Vanhandel has been seen by the palliative medicine team during  hospitalization in July 2019.  At that point, family wanted to pursue skilled nursing facility with rehab, with the goal of getting stronger to return home with Hospice.  Assessment/Recommendations/Plan   DNR  Treat what is treatable  D/C home with Hospice  Oxycodone 5mg  q4hr po for severe pain or SOB  GOC- to keep patient as comfortable as possilbe, treat what is treatable, NOT FULL COMFORT CARE YET   Goals of Care and Additional Recommendations:  Limitations on  Scope of Treatment: No Chemotherapy  Code Status:  DNR  Prognosis:   < 6 months d/t Stage IV NSCLC without options for aggressive treatment  Discharge Planning:  Home with Hospice  Care plan was discussed with patient's daughterJenny Reichmann, and Dr. Truman Hayward.  Thank you for allowing the Palliative Medicine Team to assist in the care of this patient.   Time In: 1300 Time Out: 1415 Total Time 75 minutes Prolonged Time Billed Yes      Greater than 50%  of this time was spent counseling and coordinating care related to the above assessment and plan.  Mariana Kaufman, AGNP-C Palliative Medicine   Please contact Palliative Medicine Team phone at [phone] for questions and concerns.

## 2018-01-04 NOTE — Plan of Care (Signed)

## 2018-01-04 NOTE — Care Management (Signed)
ED CM received call from 5N Nurse concerning late discharge home with [facility] services. CM noted patient was evaluated late this afternoon by [facility], information is being reviewed and is pending eligibility the expected date of discharge was scheduled for 8/27.  CM explained they will not be able to obtain insurance authorization at this time. Nurse verbalized understanding and states, she  will inform patient's family and Doctor.

## 2018-01-04 NOTE — Care Management Note (Signed)
Case Management Note  Patient Details  Name: [name] MRN: [id number] Date of Birth: 1941-03-02  Subjective/Objective:     Neuropathy             Action/Plan: Patient is to go home with Hospice Care; CM talked to [facility] to offer home hospice choice, she chose [facility]; referral made as requested. CM will continue to follow for progression of care  Expected Discharge Date:  Possibly 01/05/2018               Expected Discharge Plan:   Home with Hospice care  In-House Referral:   Palliative Care  Status of Service:   In progress  Sherrilyn Rist [phone] 01/04/2018, 3:33 PM

## 2018-01-04 NOTE — Progress Notes (Signed)
Internal Medicine Attending  Date: 01/04/2018  Patient name: [name] Medical record number: [id number] Date of birth: 02-Nov-1940 Age: 77 y.o. Gender: female  I saw and evaluated the patient. I reviewed the resident's note by Dr. Truman Hayward and I agree with the resident's findings and plans as documented in his progress note.  When seen on rounds this morning [name] noted some dizziness that was associated with hypertension.  That said, the gabapentin has been symptomatically helpful in controlling her peripheral neuropathy likely secondary to prior chemotherapy. After rounds she developed a fever with tachycardia and a leukocytosis of 11.4. She had a Klebsiella pneumonia urinary tract infection that was felt to be asymptomatic upon admission. It is possible this is causing the fevers, tachycardia, and leukocytosis and is being covered with IV ceftriaxone. We will be vigilant for other potential sources of infection as well as bleeding or a delayed post transfusion reaction, which would be a diagnosis of exclusion at this time.  We appreciate palliative care working with the patient to better define her goals and inform the family of her goals. In the meantime, we will continue to work with her from a physical therapy standpoint while she is in the hospital. We will reassess her readiness for discharge home tomorrow, but feel with the fevers, tachycardia, and leukocytosis IV antibiotics and closer observation are warranted.

## 2018-01-04 NOTE — Progress Notes (Signed)
[facility])  Received request from [facility] for family interest in [facility] services at home after discharge. Clinical information currently under review. Hospice eligibility pending at this time.   Visited with patient at bedside and spoke with daughter [name] by phone to explain services including hospice philosophy and team approach to care. Left brochure at bedside with [facility] contact information. Plan is for patient to discharge to home of daughter [name] at [place], [place] [address]. Patient already has hospital bed, oxygen setup, WC and BSC at home of daughter. Additional DME ordered for delivering to home: Overbed table. Per [name] plan is for patient to return home 01/05/18 via [facility].   Please send home with patient signed and completed out of facility DNR. Please send home with patient scripts for any medication she does not already have including comfort medications.   [facility] referral center specialists will contact daughter [name] to schedule RN visit at home after discharge.   Thank you for this referral.  Please do not hesitate to call with hospice related questions.   Thank you,  Erling Conte, LCSW

## 2018-01-04 NOTE — Progress Notes (Signed)
MD on call paged that patient BP 170/93 HR 111. Patient asymptomatic Arthor Captain LPN

## 2018-01-04 NOTE — Consult Note (Signed)
   [facility] Inpatient Consult   01/04/2018  [name] 25-Aug-1940 [id number]   Patient is recently become currently active with [facility] for chronic disease management services.  Patient has been engaged by a [employer] and CSW recently from facility.   After reviewing the current hospital notes, the patient and family may be electing to transition from palliative care to  [facility].  Will follow up with [place] with updated information.   Of note, [facility] Care Management services does not replace or interfere with any services that are needed or arranged by inpatient case management or social work.  For additional questions or referrals please contact:   Natividad Brood, RN BSN [facility] Liaison  [phone] business mobile phone Toll free office [phone]

## 2018-01-04 NOTE — Patient Outreach (Signed)
[facility]) Care Management  01/04/2018  [name] 1941/01/30 [id number]   Noted that member was readmitted to the hospital over the weekend, hospital liaisons notified. Will follow up with member/daughter once discharged.   Valente David, [place], MSN [place] [phone]

## 2018-01-05 ENCOUNTER — Other Ambulatory Visit: Payer: Medicare Other

## 2018-01-05 ENCOUNTER — Telehealth: Payer: Self-pay | Admitting: Internal Medicine

## 2018-01-05 ENCOUNTER — Other Ambulatory Visit: Payer: Self-pay | Admitting: *Deleted

## 2018-01-05 MED ORDER — GABAPENTIN 300 MG PO CAPS: 300.0000 mg | ORAL_CAPSULE | Freq: Two times a day (BID) | ORAL | Status: DC

## 2018-01-05 MED ORDER — ACETAMINOPHEN 325 MG PO TABS
650.0000 mg | ORAL_TABLET | Freq: Once | ORAL | Status: AC
  Administered 2018-01-05: 650 mg via ORAL
  Filled 2018-01-05: qty 2

## 2018-01-05 MED ORDER — ALBUTEROL SULFATE (2.5 MG/3ML) 0.083% IN NEBU: 2.5000 mg | INHALATION_SOLUTION | Freq: Four times a day (QID) | RESPIRATORY_TRACT | Status: DC

## 2018-01-05 NOTE — Progress Notes (Signed)
Internal Medicine Attending  Date: 01/05/2018  Patient name: [name] Medical record number: [id number] Date of birth: May 01, 1941 Age: 77 y.o. Gender: female  I saw and evaluated the patient. I reviewed the resident's note by Dr. Truman Hayward and I agree with the resident's findings and plans as documented in his progress note.  When seen on rounds this morning [name] was complaining of a headache, but was without other acute complaints. Arrangements have been made for home hospice and she is being discharged there today.

## 2018-01-05 NOTE — Telephone Encounter (Signed)
Returned call to Amy of Hospice and Palliative Care informing her that Dr. Daryll Drown will be the Attending physician. Hubbard Hartshorn, RN, BSN

## 2018-01-05 NOTE — Progress Notes (Signed)
Subjective:  [name] is a 77yo F w/ PMH of right lung squamous cell carcinoma stage 4 s/p 1 round of chemo, right cerebellar CVA, CKD stage 3, PAD, bilateral hearing loss, DVT, HLD, HTN on hospital day 3 for complicated UTI.  [name] was examined and evaluated at bedside this AM. Patient was sleepign comfortably. When waken she states she is endorsing headaches which feel similar to those she had when her blood pressure went up. She is AAOx 3.  She states her upper and lower extremity numbness, weakness and pain has improved. She understands the plan to discharge home today. She is alert and oriented x 3. Using the bathroom without difficulty. States she wishes the nurses would come in earlier. Denies dysuria, polyuria, frequency.  Objective: Vital signs in last 24 hours: Vitals:   01/04/18 1413 01/04/18 2113 01/05/18 0637 01/05/18 0811  BP:  117/72 (!) 145/82   Pulse:  (!) 103 (!) 119   Resp:  18 15   Temp:  99.2 F (37.3 C) (!) 101 F (38.3 C) (!) 101.3 F (38.5 C)  TempSrc:  Oral Oral Oral  SpO2: 93% 97% 95%   Weight:      Height:       Physical Exam  Constitutional: She is oriented to person, place, and time and well-developed, well-nourished, and in no distress. No distress.  HENT:  Head: Normocephalic and atraumatic.  Mouth/Throat: Oropharynx is clear and moist. No oropharyngeal exudate.  Bilateral hearing loss  Eyes: Pupils are equal, round, and reactive to light. Conjunctivae and EOM are normal. No scleral icterus.  Bilateral nystagmus  Neck: Normal range of motion. Neck supple.  Cardiovascular: Normal rate, regular rhythm, normal heart sounds and intact distal pulses.  Pulmonary/Chest: Effort normal. No respiratory distress. She has wheezes (Upper lobe expiratory wheeze. R>L). She has no rales.  Abdominal: Soft. Bowel sounds are normal. She exhibits no distension. There is no tenderness. There is no guarding.  Musculoskeletal: Normal range of motion. She exhibits  no edema, tenderness or deformity.  Neurological: She is alert and oriented to person, place, and time. No cranial nerve deficit.  Skin: Skin is warm and dry.   Assessment/Plan:  Principal Problem:   Neuropathy due to chemotherapeutic drug ([place]) Active Problems:   UTI (urinary tract infection)   Pressure injury of skin   Urinary tract infection due to Klebsiella species   Adenocarcinoma of right lung, stage 4 (HCC)   Advance care planning   Nonintractable episodic headache  [name] is a 77 yo F w/ PMH of right lung squamous cell carcinoma stage 4, right cerebellar CVA, CKD stage 3, PAD, bilateral hearing loss in patient for complicated UTI and peripheral neuropathy. Neuropathy resolved with gabapentin. Palliative had family meeting yesterday and she was deemed appropriate for home hospice. Still endorsing fever and tachycardia but she is stable for discharge on oral antibiotic.  Complicated UTI w/ Kleb Pneumo Endorsing tachycardia, hypertension, fever (101.83F) and headache. Currently on day 2 of 1g ceftriaxone. She is stable for discharge with home hospice. She will be sent with oral antibiotics and has prescribed Norco at home for fever and pain control. 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 States resolved with gabapentin - Change to gabapentin 300mg  BID from TID since she has reduced renal function per pharm  Stage 4 Right Lung  Squamous Cell Carcinoma Discussed goals of care with family and palliative yesterday on 01/04/18. Daughter wants patient DNR but with comfort. Agrees to home hospice. Not on full comfort care yet. - Appreciate palliative recs - Discharge with home hospice  DVT prophx: SCDs (pt refusing heparin) Diet: Regular Code: FULL  Dispo: Anticipated discharge in  approximately today  Ina Homes, MD 01/05/2018, 9:14 AM  Gilberto Better, PGY1 Pager: [phone]

## 2018-01-05 NOTE — Progress Notes (Signed)
Notified provider of fever 101

## 2018-01-05 NOTE — Care Management Important Message (Signed)
Important Message  Patient Details  Name: [name] MRN: [id number] Date of Birth: 23-Oct-1940   Medicare Important Message Given:  Yes    Orbie Pyo 01/05/2018, 3:39 PM

## 2018-01-05 NOTE — Telephone Encounter (Signed)
Amy from Hospice wanted to know if Dr Daryll Drown is the attending

## 2018-01-05 NOTE — Progress Notes (Addendum)
[facility] of [facility])  Received request from [facility] 01/04/18 for family interest in [facility] services at home after discharge. Hospice eligibility confirmed. RNCM Hassan Rowan informed.   Visited with patient at bedside 01/04/18 and spoke with daughter [name] by phone 01/04/18 to explain services including hospice philosophy and team approach to care. Left brochure at bedside with [facility] contact information. Plan is for patient to discharge to home of daughter [name] at [place], [place] [address]. Patient already has hospital bed, oxygen setup, WC and BSC at home of daughter. Additional DME ordered for delivery to home: Overbed table. Per [name] plan is for patient to return home 01/05/18 via [place].   Please send home with patient signed and completed out of facility DNR. Please send home with patient scripts for any medication she does not already have including comfort medications.   [facility] referral center specialists will contact daughter [name] to schedule RN visit at home after discharge.   Thank you for this referral.  Please do not hesitate to call with hospice related questions.   Thank you,  Erling Conte, LCSW

## 2018-01-05 NOTE — Patient Outreach (Signed)
[facility]) Care Management  01/05/2018  [name] 31-Dec-1940 [id number]   Noted that member discharged from hospital to home with hospice.  Will close case at this time as no further needs identified.  Valente David, [place], MSN [place] [phone]

## 2018-01-05 NOTE — Telephone Encounter (Signed)
Thank you :)

## 2018-01-05 NOTE — Progress Notes (Signed)
Spoke w Harmon Pier, [facility], intake will be this evening at daughter's house. Coordinated PTAR time w bedside RN and patient's daughter for ~13:00. Requested transport from [facility]. DNR form and PTAR packet on patient's shadow chart in drawer.

## 2018-01-08 ENCOUNTER — Ambulatory Visit (INDEPENDENT_AMBULATORY_CARE_PROVIDER_SITE_OTHER): Admitting: Internal Medicine

## 2018-01-08 DIAGNOSIS — R32 Unspecified urinary incontinence: Secondary | ICD-10-CM | POA: Diagnosis not present

## 2018-01-08 DIAGNOSIS — T451X5D Adverse effect of antineoplastic and immunosuppressive drugs, subsequent encounter: Secondary | ICD-10-CM

## 2018-01-08 DIAGNOSIS — G620 Drug-induced polyneuropathy: Secondary | ICD-10-CM | POA: Diagnosis not present

## 2018-01-08 DIAGNOSIS — E46 Unspecified protein-calorie malnutrition: Secondary | ICD-10-CM

## 2018-01-08 DIAGNOSIS — Z9889 Other specified postprocedural states: Secondary | ICD-10-CM

## 2018-01-08 DIAGNOSIS — C3411 Malignant neoplasm of upper lobe, right bronchus or lung: Secondary | ICD-10-CM | POA: Diagnosis not present

## 2018-01-08 DIAGNOSIS — Z681 Body mass index (BMI) 19 or less, adult: Secondary | ICD-10-CM

## 2018-01-08 DIAGNOSIS — R159 Full incontinence of feces: Secondary | ICD-10-CM

## 2018-01-08 DIAGNOSIS — Z79899 Other long term (current) drug therapy: Secondary | ICD-10-CM

## 2018-01-08 DIAGNOSIS — R1033 Periumbilical pain: Secondary | ICD-10-CM

## 2018-01-08 DIAGNOSIS — Z792 Long term (current) use of antibiotics: Secondary | ICD-10-CM

## 2018-01-08 DIAGNOSIS — Z87440 Personal history of urinary (tract) infections: Secondary | ICD-10-CM

## 2018-01-08 DIAGNOSIS — Z79891 Long term (current) use of opiate analgesic: Secondary | ICD-10-CM

## 2018-01-08 DIAGNOSIS — G5793 Unspecified mononeuropathy of bilateral lower limbs: Secondary | ICD-10-CM

## 2018-01-08 DIAGNOSIS — C3491 Malignant neoplasm of unspecified part of right bronchus or lung: Secondary | ICD-10-CM

## 2018-01-08 DIAGNOSIS — D61818 Other pancytopenia: Secondary | ICD-10-CM

## 2018-01-08 NOTE — Progress Notes (Signed)
CC: Hospital follow up   HPI:  [name] is a 77 y.o. with metastatic squamous cell carcinoma of the lung presenting after recent hospitalization.  See problem based charting for further details.  Stage IV squamous cell carcinoma of the lung: 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  Per chart review, she has been unable to tolerate chemotherapy due to side effects.  She was recently hospitalized from 01/02/2018 to 01/05/2018 when she presented with bilateral lower extremity numbness and pain with which was later noted to be secondary to chemotherapy induced peripheral neuropathy.  During her hospitalization, palliative was consulted and was subsequently discharged with home hospice on full treatment.  Hx of complicated UTI: She was noted to have UA positive for leukocyte esterase, nitrates and bacteria however asymptomatic.  Urinary cultures were positive for Klebsiella pneumonia and she received ceftriaxone 1g x1 dose.  She was subsequently discharged with a 7-day course of ciprofloxacin 500 mg twice daily.  Today, her son and family member was present at her clinic visit and reports of taking medicine appropriately.  Per conversation with patient, she denies fevers, chills, dysuria, frequency.  She has long-standing history of urinary and bowel incontinence but presently no signs of ongoing urinary tract infectious symptoms.  Plan: - Continue ciprofloxacin 500 mg twice daily until 01/12/2018  Abdominal pain: She reports of abdominal pain located at the umbilicus which began this morning.  The pain is intermittent, nonradiating in nature.  She endorses some nausea but denies emesis, fevers, chills, diarrhea, hematochezia.  On further questioning, she reported that her last bowel movement was 3 days ago.  Her ongoing abdominal pain is most likely secondary to  constipation.  She was discharged with MiraLAX and Dulcolax and per son she has not taking these medications yet.  Also, she has been taking Norco and Percocet at home for associated with cancer which can also precipitate constipation.  Plan: - Patient advised to take MiraLAX and if no improvement, can add Dulcolax  Anemia: She was noted to have significant drop in hemoglobin from 7.7 on admission to 6.4 and she received 1 unit of packed red blood cell.  Repeat CBC showed hemoglobin of 8.2. This is most likely due to chemotherapy induced pancytopenia as its noted on chart review.  Today, she denies chest pain, palpitation, lightheadedness, dizziness or headaches.  Past Medical History:  Diagnosis Date  . Anemia   . Arthritis    "left shoulder" (04/07/2016)  . Blind    s/p bilateral corneal transplant; "legally blind in both eyes" (04/07/2016)  . Cancer ([place])    lung  . CKD (chronic kidney disease), stage III ([place])   . COPD (chronic obstructive pulmonary disease) ([place])    pt denies  . DVT (deep venous thrombosis) ([place]) "years ago"   "right thigh"    . HEARING LOSS   . Hyperlipidemia   . Hypertension   . PAD (peripheral artery disease) ([place])    a. 03/2016 Periph Angio: L SFA 99p, 82m w/ one vessel runoff. R SFA 167m w/ one vessel runofff;  b. 04/07/2016 PTA L SFA ([place] 1 LS atherecromty w/ DEBA). c. 04/24/16 s/p PTA to R SFA, right tibioperoneal trunk and posterior tibial artery.  . Phlebitis   . Squamous cell carcinoma lung ([place])   . Tobacco abuse    Review of Systems:  AS per HPI  Physical Exam:  Vitals:  01/08/18 1124  BP: (!) 100/54  Pulse: 99  Temp: 100 F (37.8 C)  TempSrc: Oral  Weight: 113 lb (51.3 kg)  Height: 5\' 4"  (1.626 m)   Physical Exam  Constitutional: She appears malnourished. She appears cachectic.  Non-toxic appearance. She does not have a sickly appearance.  Cardiovascular: Normal rate, regular rhythm and normal heart sounds. Exam reveals no gallop and  no friction rub.  No murmur heard. Pulmonary/Chest: Effort normal and breath sounds normal. No respiratory distress. She has no wheezes. She has no rales.  Abdominal: Soft. She exhibits no distension. Bowel sounds are not hypoactive. There is no tenderness.  Neurological: She is alert.  Alert and oriented to name and place    Assessment & Plan:   See Encounters Tab for problem based charting.  Patient discussed with Dr. Lynnae January

## 2018-01-08 NOTE — Patient Instructions (Addendum)
[name],   It was a pleasure taking care of you today at the clinic. Your stomach pain is most likely due to contipation and since you have miralax and ducolax at home, i'll advice for you to take them  ~Dr. Eileen Stanford

## 2018-01-09 ENCOUNTER — Encounter: Payer: Self-pay | Admitting: Internal Medicine

## 2018-01-09 DIAGNOSIS — R109 Unspecified abdominal pain: Secondary | ICD-10-CM | POA: Insufficient documentation

## 2018-01-09 NOTE — Assessment & Plan Note (Signed)
She was noted to have UA positive for leukocyte esterase, nitrates and bacteria however asymptomatic.  Urinary cultures were positive for Klebsiella pneumonia and she received ceftriaxone 1g x1 dose.  She was subsequently discharged with a 7-day course of ciprofloxacin 500 mg twice daily.  Today, her son and family member was present at her clinic visit and reports of taking medicine appropriately.  Per conversation with patient, she denies fevers, chills, dysuria, frequency.  She has long-standing history of urinary and bowel incontinence but presently no signs of ongoing urinary tract infectious symptoms.  Plan: - Continue ciprofloxacin 500 mg twice daily until 01/12/2018

## 2018-01-09 NOTE — Assessment & Plan Note (Signed)
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  Per chart review, she has been unable to tolerate chemotherapy due to side effects.  She was recently hospitalized from 01/02/2018 to 01/05/2018 when she presented with bilateral lower extremity numbness and pain with which was later noted to be secondary to chemotherapy induced peripheral neuropathy.  During her hospitalization, palliative was consulted and was subsequently discharged with home hospice on full treatment.

## 2018-01-09 NOTE — Assessment & Plan Note (Signed)
She was noted to have significant drop in hemoglobin from 7.7 on admission to 6.4 and she received 1 unit of packed red blood cell.  Repeat CBC showed hemoglobin of 8.2. This is most likely due to chemotherapy induced pancytopenia as its noted on chart review.  Today, she denies chest pain, palpitation, lightheadedness, dizziness or headaches.

## 2018-01-09 NOTE — Assessment & Plan Note (Signed)
She reports of abdominal pain located at the umbilicus which began this morning.  The pain is intermittent, nonradiating in nature.  She endorses some nausea but denies emesis, fevers, chills, diarrhea, hematochezia.  On further questioning, she reported that her last bowel movement was 3 days ago.  Her ongoing abdominal pain is most likely secondary to constipation.  She was discharged with MiraLAX and Dulcolax and per son she has not taking these medications yet.  Also, she has been taking Norco and Percocet at home for associated with cancer which can also precipitate constipation.  Plan: - Patient advised to take MiraLAX and if no improvement, can add Dulcolax

## 2018-01-13 NOTE — Progress Notes (Signed)
Internal Medicine Clinic Attending  I saw and evaluated the patient.  I personally confirmed the key portions of the history and exam documented by Dr. Agyei and I reviewed pertinent patient test results.  The assessment, diagnosis, and plan were formulated together and I agree with the documentation in the resident's note.  

## 2018-01-14 ENCOUNTER — Ambulatory Visit: Payer: Medicare Other | Admitting: *Deleted

## 2018-01-18 ENCOUNTER — Telehealth: Payer: Self-pay | Admitting: *Deleted

## 2018-01-18 NOTE — Telephone Encounter (Signed)
Izora Gala with [facility] called to report patient's death at 65 this AM. Hubbard Hartshorn, RN, BSN

## 2018-01-18 NOTE — Telephone Encounter (Signed)
Thank you for the information.

## 2018-01-28 ENCOUNTER — Ambulatory Visit: Payer: Medicare Other | Admitting: Emergency Medicine

## 2018-03-03 ENCOUNTER — Other Ambulatory Visit: Payer: Self-pay | Admitting: Internal Medicine

## 2018-04-21 ENCOUNTER — Other Ambulatory Visit: Payer: Self-pay | Admitting: Internal Medicine

## 2019-02-10 DEATH — deceased

## 2019-08-13 IMAGING — CR DG CHEST 2V
2 series · 2 of 2 positions shown · non-contrast
Comparison: Prior radiograph from 11/02/2017

CLINICAL DATA: Initial evaluation for acute chest pain.

EXAM:
CHEST - 2 VIEW

[chest pa]
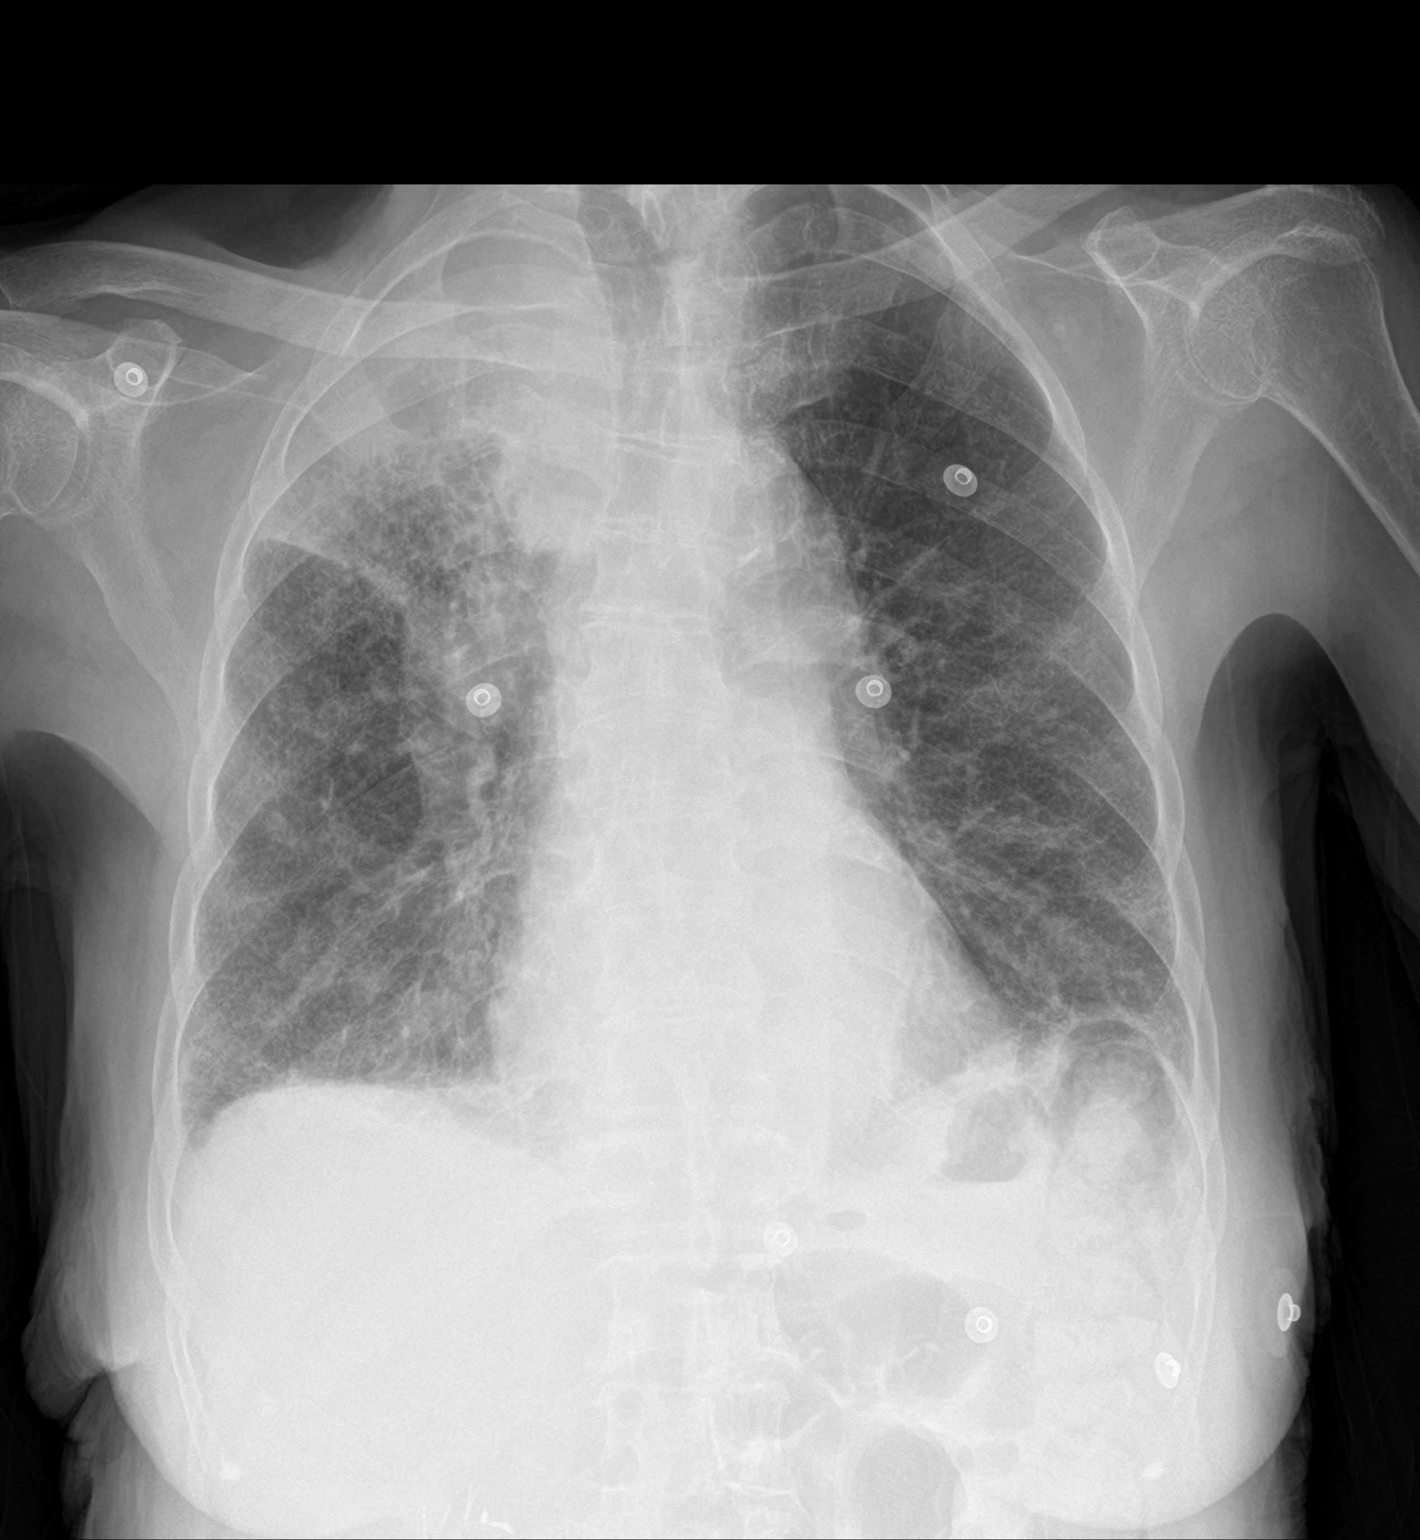

[chest lat]
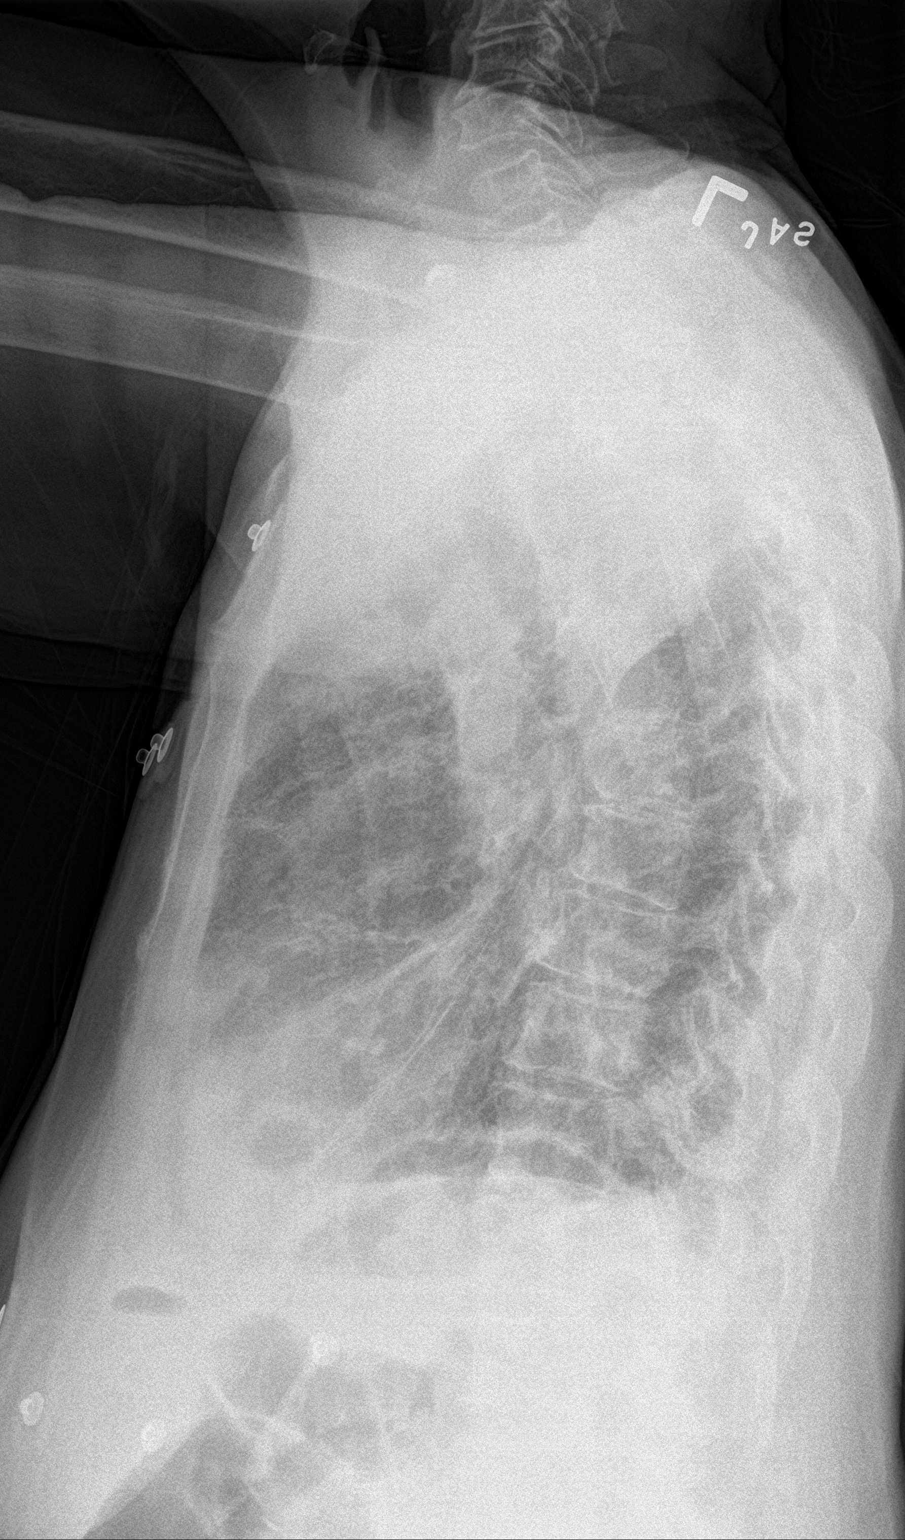

[2 of 2 positions shown; findings below may reference images not displayed]

FINDINGS: Transverse heart size stable, and remains within normal limits.
Increased density along the right paratracheal stripe with
prominence of the right hilum, suggesting adenopathy. Aortic
atherosclerosis.

Overall lung volumes are reduced. Chronic fibrotic lung changes
relatively similar to previous. Consolidative opacity at the right
upper lobe, relatively unchanged from previous. No new other focal
airspace disease. No pulmonary edema or pleural effusion. No
pneumothorax.

No acute osseus abnormality.
IMPRESSION: 1. Overall no significant interval change in consolidative opacity
at the right upper lobe with associated mediastinal and hilar
adenopathy, again concerning for malignancy. Finding better assessed
on recent CT from 10/12/2017.
[DATE]. Underlying chronic fibrotic lung changes, stable.

## 2019-08-13 IMAGING — CT CT ANGIO CHEST
2 of 6 series · 17 of 46 positions shown · IV contrast (APPLIED)
Comparison: 10/12/2017 chest CT. 09/29/2017 PET-CT. 11/15/2017
chest radiograph.

CLINICAL DATA: Chest pain. Right lung squamous cell carcinoma
diagnosed 10/16/2017.

EXAM:
CT ANGIOGRAPHY CHEST WITH CONTRAST
TECHNIQUE: Multidetector CT imaging of the chest was performed using the
standard protocol during bolus administration of intravenous
contrast. Multiplanar CT image reconstructions and MIPs were
obtained to evaluate the vascular anatomy.
CONTRAST:  50mL [id number] IOPAMIDOL ([id number]) INJECTION 76%

[Series 7: thins · axial · 0.52mm/px · z∈[+1117,+1358]mm · 14 of 379 slices shown]
[im 17/379  lung]
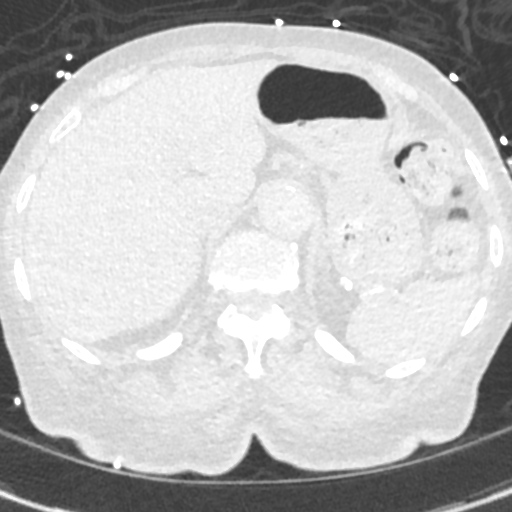
[im 50/379  soft-tissue]
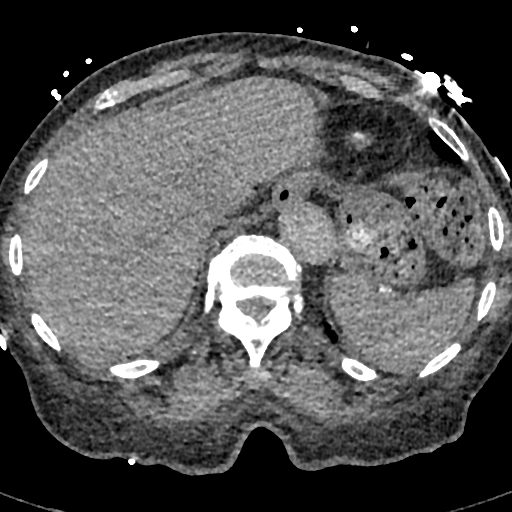
[im 66/379  lung]
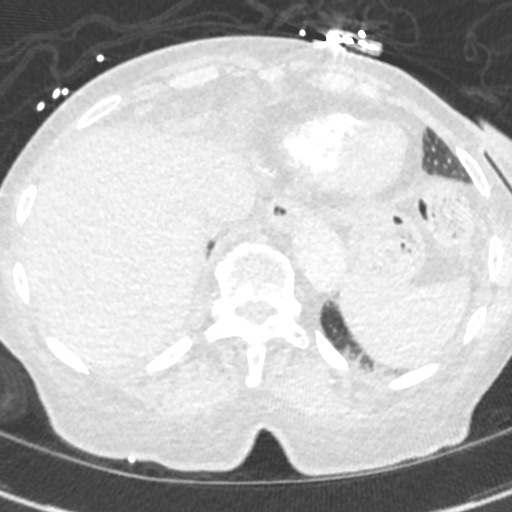
[im 99/379  soft-tissue]
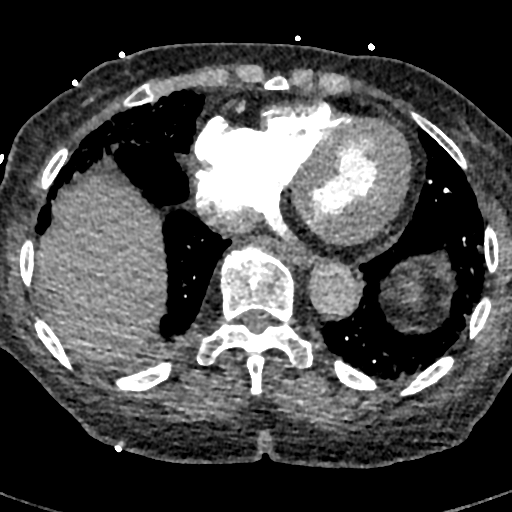
[im 132/379  lung]
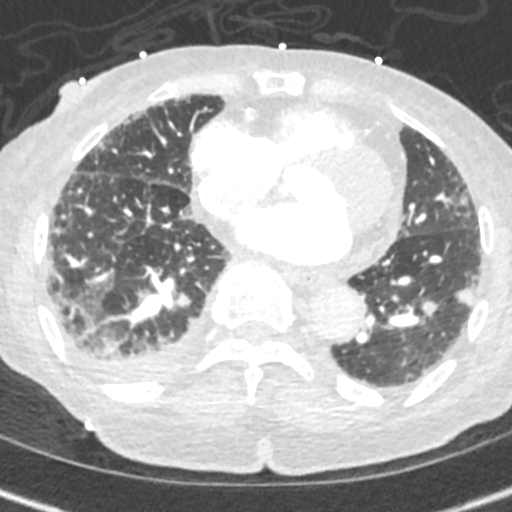
[im 148/379  soft-tissue]
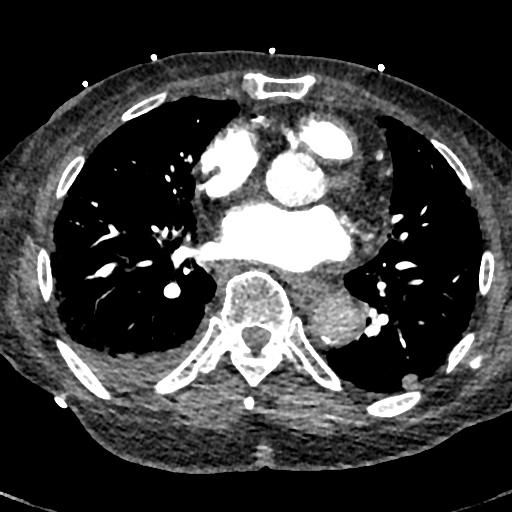
[im 181/379  lung]
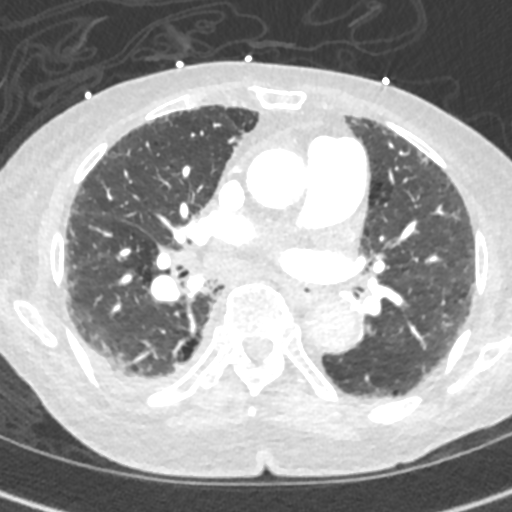
[im 198/379  soft-tissue]
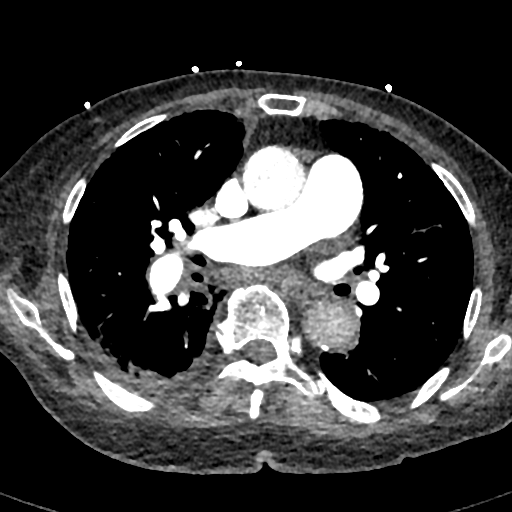
[im 231/379  lung]
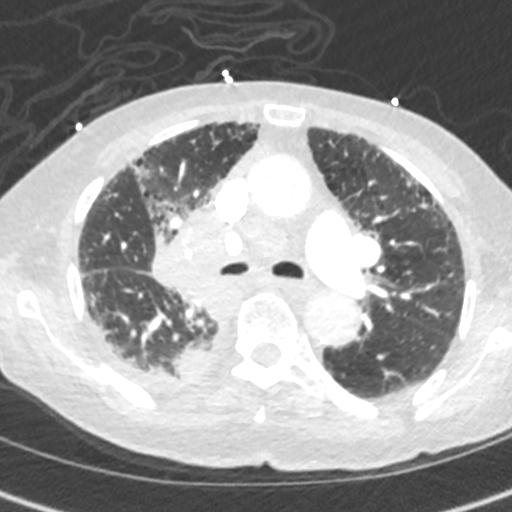
[im 247/379  soft-tissue]
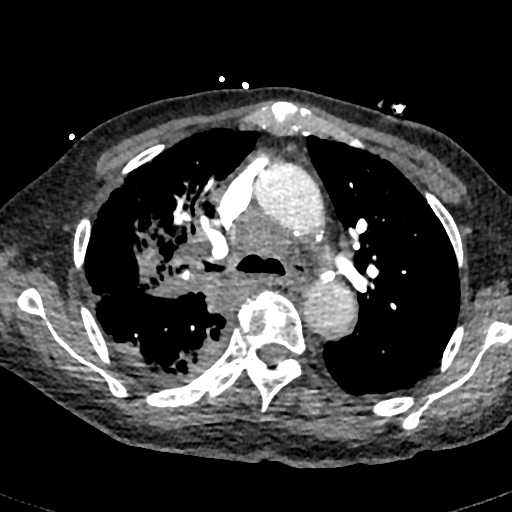
[im 280/379  lung]
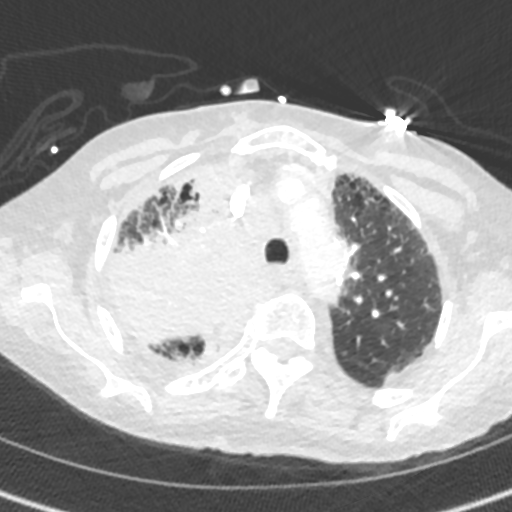
[im 313/379  soft-tissue]
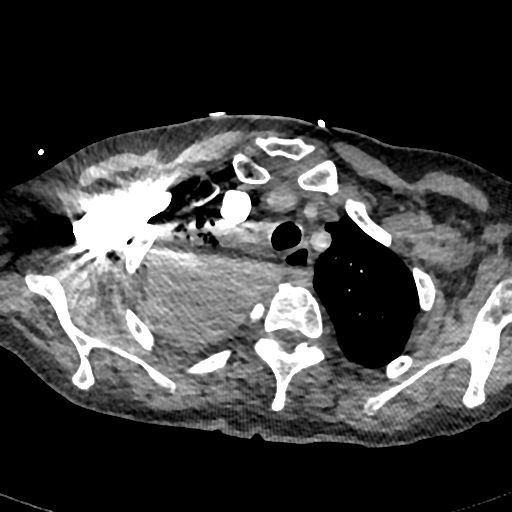
[im 329/379  lung]
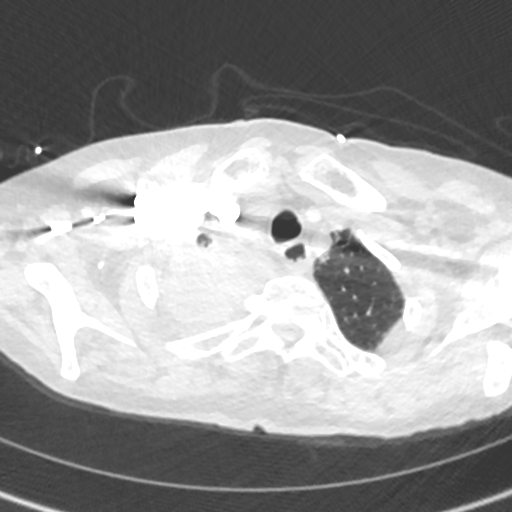
[im 362/379  soft-tissue]
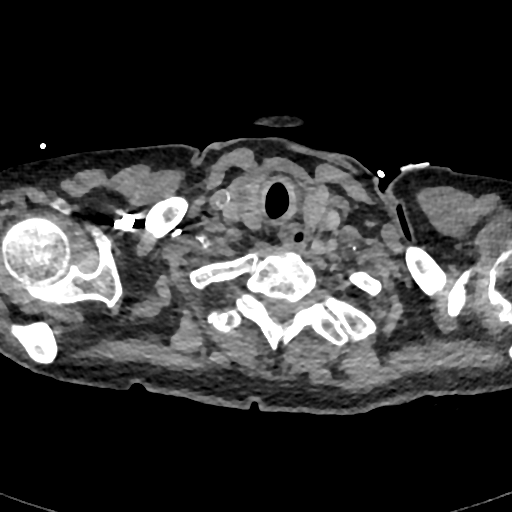

[Series 8: cor · coronal · 0.52mm/px · 3 of 97 slices shown]
[im 25/97  soft-tissue]
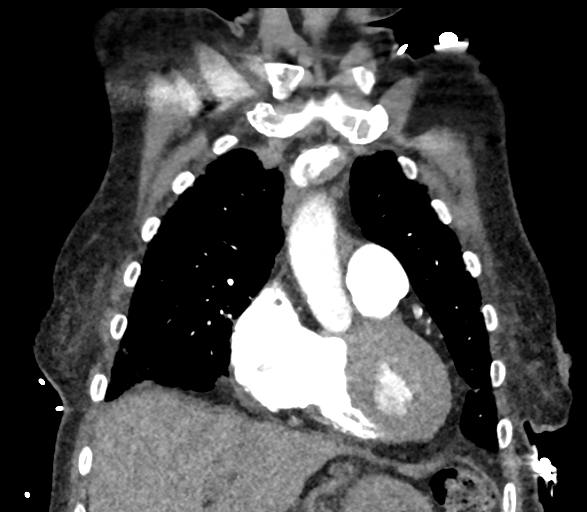
[im 49/97  soft-tissue]
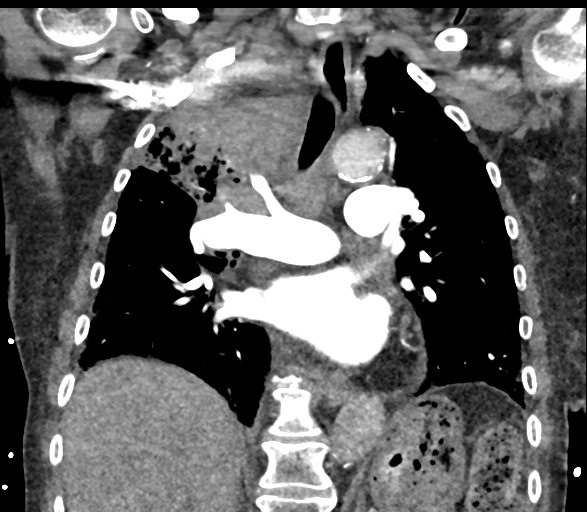
[im 73/97  soft-tissue]
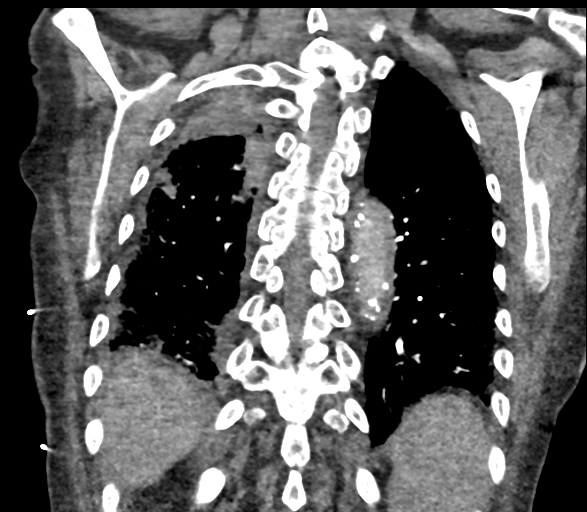

[17 of 46 positions shown; findings below may reference images not displayed]

FINDINGS: Cardiovascular: The study is high quality for the evaluation of
pulmonary embolism. There are no filling defects in the central,
lobar, segmental or subsegmental pulmonary artery branches
suggestive of acute pulmonary embolism. There are segmental and
subsegmental indistinct filling defects in the right upper lobe
pulmonary artery branches (series 7/image 133), favored to be due to
a combination of mixing artifact from slow flow and potentially
tumor invasion. Atherosclerotic nonaneurysmal thoracic aorta. Stable
top-normal caliber main pulmonary artery (3.1 cm diameter). Normal
heart size. Stable trace pericardial effusion/thickening.
Three-vessel coronary atherosclerosis.

Mediastinum/Nodes: Stable hypodense 1.1 cm right thyroid lobe
nodule. Unremarkable esophagus. No axillary adenopathy. Newly
enlarged 1.1 cm right supraclavicular node (series 5/image 4).
Dominant enlarged 2.3 cm right paratracheal node (series 5/image
47), slightly increased from 2.2 cm. Dominant enlarged 2.3 cm right
hilar node (series 5/image 50), not appreciably changed. No left
hilar adenopathy.

Lungs/Pleura: No pneumothorax. Trace dependent right pleural
effusion. No left pleural effusion. Mild centrilobular and
paraseptal emphysema with diffuse bronchial wall thickening. Right
upper lobe 7.8 x 6.8 cm lung mass, mildly increased from 7.5 x
cm. Multiple (at least 8) solid pulmonary nodules scattered in both
lungs, most prominent in the lower lobes, all new/increased.
Representative 1.0 cm right lower lobe nodule (series 6/image 85),
increased from 0.3 cm. Representative 1.1 cm posterior left lower
lobe nodule (series 6/image 81), increased from 0.4 cm. Patchy
subpleural reticulation and ground-glass attenuation throughout both
lungs is not appreciably changed.

Upper abdomen: No acute abnormality.

Musculoskeletal: No aggressive appearing focal osseous lesions. Mild
thoracic spondylosis.

Review of the MIP images confirms the above findings.
IMPRESSION: 1. No convincing findings of pulmonary embolism. Indistinct
segmental/subsegmental filling defects in the right upper lobe
pulmonary artery branches, favored to be due to a combination of
mixing artifact from slow flow and potentially tumor invasion.
2. Mild interval growth of 7.8 cm right upper lobe lung mass
compatible with known malignancy.
3. New mild right supraclavicular nodal metastasis. Slight growth of
right paratracheal nodal metastases. Stable right hilar nodal
metastases.
4. Progression of bilateral pulmonary metastases.
5. Trace dependent right pleural effusion.

Aortic Atherosclerosis (A0E1A-TQQ.Q) and Emphysema ([id number].P).
